# Patient Record
Sex: Male | Born: 1955 | Race: White | Hispanic: No | Marital: Married | Smoking: Never smoker
Health system: Southern US, Community
[De-identification: ages and names within clinical notes are randomized; demographics above are authoritative.]

## PROBLEM LIST (undated history)

## (undated) DIAGNOSIS — E162 Hypoglycemia, unspecified: Secondary | ICD-10-CM

## (undated) DIAGNOSIS — M1990 Unspecified osteoarthritis, unspecified site: Secondary | ICD-10-CM

## (undated) DIAGNOSIS — G4730 Sleep apnea, unspecified: Secondary | ICD-10-CM

## (undated) DIAGNOSIS — R569 Unspecified convulsions: Secondary | ICD-10-CM

## (undated) HISTORY — DX: Hypoglycemia, unspecified: E16.2

## (undated) HISTORY — DX: Sleep apnea, unspecified: G47.30

## (undated) HISTORY — DX: Unspecified convulsions: R56.9

## (undated) HISTORY — DX: Unspecified osteoarthritis, unspecified site: M19.90

---

## 1987-03-17 HISTORY — PX: CHOLECYSTECTOMY: SHX55

## 1991-08-15 DIAGNOSIS — R569 Unspecified convulsions: Secondary | ICD-10-CM

## 1991-08-15 DIAGNOSIS — Z87898 Personal history of other specified conditions: Secondary | ICD-10-CM | POA: Insufficient documentation

## 2000-07-16 ENCOUNTER — Emergency Department (HOSPITAL_COMMUNITY): Admission: EM | Admit: 2000-07-16 | Discharge: 2000-07-17 | Payer: Self-pay | Admitting: Emergency Medicine

## 2000-07-16 ENCOUNTER — Encounter: Payer: Self-pay | Admitting: Emergency Medicine

## 2000-08-14 HISTORY — PX: OTHER SURGICAL HISTORY: SHX169

## 2000-09-04 ENCOUNTER — Emergency Department (HOSPITAL_COMMUNITY): Admission: EM | Admit: 2000-09-04 | Discharge: 2000-09-04 | Payer: Self-pay

## 2006-12-27 ENCOUNTER — Ambulatory Visit: Payer: Self-pay | Admitting: Family Medicine

## 2006-12-27 DIAGNOSIS — N529 Male erectile dysfunction, unspecified: Secondary | ICD-10-CM | POA: Insufficient documentation

## 2006-12-27 DIAGNOSIS — M7710 Lateral epicondylitis, unspecified elbow: Secondary | ICD-10-CM | POA: Insufficient documentation

## 2006-12-31 ENCOUNTER — Ambulatory Visit: Payer: Self-pay | Admitting: Family Medicine

## 2007-01-11 LAB — CONVERTED CEMR LAB
ALT: 20 units/L (ref 0–53)
AST: 20 units/L (ref 0–37)
Albumin: 4 g/dL (ref 3.5–5.2)
Alkaline Phosphatase: 67 units/L (ref 39–117)
BUN: 13 mg/dL (ref 6–23)
Basophils Absolute: 0 10*3/uL (ref 0.0–0.1)
Basophils Relative: 0.5 % (ref 0.0–1.0)
Bilirubin, Direct: 0.1 mg/dL (ref 0.0–0.3)
CO2: 29 meq/L (ref 19–32)
Calcium: 9 mg/dL (ref 8.4–10.5)
Chloride: 110 meq/L (ref 96–112)
Creatinine, Ser: 0.8 mg/dL (ref 0.4–1.5)
Eosinophils Absolute: 0.2 10*3/uL (ref 0.0–0.6)
Eosinophils Relative: 2.5 % (ref 0.0–5.0)
GFR calc Af Amer: 131 mL/min
GFR calc non Af Amer: 108 mL/min
Glucose, Bld: 98 mg/dL (ref 70–99)
HCT: 41.2 % (ref 39.0–52.0)
Hemoglobin: 14.2 g/dL (ref 13.0–17.0)
Lymphocytes Relative: 31.6 % (ref 12.0–46.0)
MCHC: 34.6 g/dL (ref 30.0–36.0)
MCV: 91.1 fL (ref 78.0–100.0)
Monocytes Absolute: 0.6 10*3/uL (ref 0.2–0.7)
Monocytes Relative: 8.9 % (ref 3.0–11.0)
Neutro Abs: 4.1 10*3/uL (ref 1.4–7.7)
Neutrophils Relative %: 56.5 % (ref 43.0–77.0)
PSA: 0.27 ng/mL (ref 0.10–4.00)
Platelets: 330 10*3/uL (ref 150–400)
Potassium: 4.3 meq/L (ref 3.5–5.1)
RBC: 4.52 M/uL (ref 4.22–5.81)
RDW: 12.2 % (ref 11.5–14.6)
Sodium: 143 meq/L (ref 135–145)
TSH: 3.04 microintl units/mL (ref 0.35–5.50)
Total Bilirubin: 0.9 mg/dL (ref 0.3–1.2)
Total Protein: 7.2 g/dL (ref 6.0–8.3)
WBC: 7.2 10*3/uL (ref 4.5–10.5)

## 2007-01-12 ENCOUNTER — Ambulatory Visit: Payer: Self-pay | Admitting: Family Medicine

## 2007-01-13 ENCOUNTER — Encounter (INDEPENDENT_AMBULATORY_CARE_PROVIDER_SITE_OTHER): Payer: Self-pay | Admitting: *Deleted

## 2007-04-19 ENCOUNTER — Telehealth: Payer: Self-pay | Admitting: Family Medicine

## 2009-01-18 ENCOUNTER — Ambulatory Visit: Payer: Self-pay | Admitting: Family Medicine

## 2009-01-18 DIAGNOSIS — E162 Hypoglycemia, unspecified: Secondary | ICD-10-CM | POA: Insufficient documentation

## 2009-01-18 DIAGNOSIS — E785 Hyperlipidemia, unspecified: Secondary | ICD-10-CM | POA: Insufficient documentation

## 2009-01-18 LAB — CONVERTED CEMR LAB
ALT: 19 units/L (ref 0–53)
AST: 21 units/L (ref 0–37)
Albumin: 4.1 g/dL (ref 3.5–5.2)
Alkaline Phosphatase: 76 units/L (ref 39–117)
BUN: 13 mg/dL (ref 6–23)
Basophils Absolute: 0 10*3/uL (ref 0.0–0.1)
Basophils Relative: 0 % (ref 0.0–3.0)
Bilirubin, Direct: 0 mg/dL (ref 0.0–0.3)
CO2: 30 meq/L (ref 19–32)
Calcium: 9.1 mg/dL (ref 8.4–10.5)
Chloride: 105 meq/L (ref 96–112)
Cholesterol: 188 mg/dL (ref 0–200)
Creatinine, Ser: 0.8 mg/dL (ref 0.4–1.5)
Creatinine,U: 95 mg/dL
Eosinophils Absolute: 0.2 10*3/uL (ref 0.0–0.7)
Eosinophils Relative: 2.5 % (ref 0.0–5.0)
GFR calc non Af Amer: 107.27 mL/min (ref >60)
Glucose, Bld: 97 mg/dL (ref 70–99)
HCT: 43.8 % (ref 39.0–52.0)
HDL: 43.7 mg/dL (ref >39.00)
Hemoglobin: 14.8 g/dL (ref 13.0–17.0)
LDL Cholesterol: 122 mg/dL — ABNORMAL HIGH (ref 0–99)
Lymphocytes Relative: 33.4 % (ref 12.0–46.0)
Lymphs Abs: 2.3 10*3/uL (ref 0.7–4.0)
MCHC: 33.8 g/dL (ref 30.0–36.0)
MCV: 94.4 fL (ref 78.0–100.0)
Microalb Creat Ratio: 2.1 mg/g (ref 0.0–30.0)
Microalb, Ur: 0.2 mg/dL (ref 0.0–1.9)
Monocytes Absolute: 0.6 10*3/uL (ref 0.1–1.0)
Monocytes Relative: 8 % (ref 3.0–12.0)
Neutro Abs: 3.9 10*3/uL (ref 1.4–7.7)
Neutrophils Relative %: 56.1 % (ref 43.0–77.0)
PSA: 0.33 ng/mL (ref 0.10–4.00)
Platelets: 295 10*3/uL (ref 150.0–400.0)
Potassium: 4.8 meq/L (ref 3.5–5.1)
RBC: 4.64 M/uL (ref 4.22–5.81)
RDW: 12.2 % (ref 11.5–14.6)
Sodium: 142 meq/L (ref 135–145)
TSH: 3.12 microintl units/mL (ref 0.35–5.50)
Total Bilirubin: 0.8 mg/dL (ref 0.3–1.2)
Total CHOL/HDL Ratio: 4
Total Protein: 7.2 g/dL (ref 6.0–8.3)
Triglycerides: 113 mg/dL (ref 0.0–149.0)
VLDL: 22.6 mg/dL (ref 0.0–40.0)
WBC: 7 10*3/uL (ref 4.5–10.5)

## 2009-01-24 ENCOUNTER — Ambulatory Visit: Payer: Self-pay | Admitting: Family Medicine

## 2009-02-15 ENCOUNTER — Ambulatory Visit: Payer: Self-pay | Admitting: Family Medicine

## 2009-02-15 LAB — FECAL OCCULT BLOOD, GUAIAC: Fecal Occult Blood: NEGATIVE

## 2009-02-15 LAB — CONVERTED CEMR LAB
OCCULT 1: NEGATIVE
OCCULT 2: NEGATIVE
OCCULT 3: NEGATIVE

## 2009-02-18 ENCOUNTER — Encounter (INDEPENDENT_AMBULATORY_CARE_PROVIDER_SITE_OTHER): Payer: Self-pay | Admitting: *Deleted

## 2009-10-22 ENCOUNTER — Encounter (INDEPENDENT_AMBULATORY_CARE_PROVIDER_SITE_OTHER): Payer: Self-pay | Admitting: *Deleted

## 2010-02-24 ENCOUNTER — Ambulatory Visit: Payer: Self-pay | Admitting: Family Medicine

## 2010-02-25 LAB — CONVERTED CEMR LAB
ALT: 21 units/L (ref 0–53)
AST: 22 units/L (ref 0–37)
Albumin: 4 g/dL (ref 3.5–5.2)
Alkaline Phosphatase: 64 units/L (ref 39–117)
BUN: 13 mg/dL (ref 6–23)
Basophils Absolute: 0 10*3/uL (ref 0.0–0.1)
Basophils Relative: 0.2 % (ref 0.0–3.0)
Bilirubin, Direct: 0.1 mg/dL (ref 0.0–0.3)
CO2: 26 meq/L (ref 19–32)
Calcium: 9.2 mg/dL (ref 8.4–10.5)
Chloride: 106 meq/L (ref 96–112)
Cholesterol: 191 mg/dL (ref 0–200)
Creatinine, Ser: 0.7 mg/dL (ref 0.4–1.5)
Creatinine,U: 151.9 mg/dL
Eosinophils Absolute: 0.1 10*3/uL (ref 0.0–0.7)
Eosinophils Relative: 1.5 % (ref 0.0–5.0)
GFR calc non Af Amer: 116.88 mL/min (ref >60.00)
Glucose, Bld: 91 mg/dL (ref 70–99)
HCT: 43 % (ref 39.0–52.0)
HDL: 44.9 mg/dL (ref >39.00)
Hemoglobin: 14.7 g/dL (ref 13.0–17.0)
LDL Cholesterol: 124 mg/dL — ABNORMAL HIGH (ref 0–99)
Lymphocytes Relative: 31.7 % (ref 12.0–46.0)
Lymphs Abs: 2.8 10*3/uL (ref 0.7–4.0)
MCHC: 34.2 g/dL (ref 30.0–36.0)
MCV: 92.7 fL (ref 78.0–100.0)
Microalb Creat Ratio: 0.4 mg/g (ref 0.0–30.0)
Microalb, Ur: 0.6 mg/dL (ref 0.0–1.9)
Monocytes Absolute: 0.6 10*3/uL (ref 0.1–1.0)
Monocytes Relative: 7.1 % (ref 3.0–12.0)
Neutro Abs: 5.2 10*3/uL (ref 1.4–7.7)
Neutrophils Relative %: 59.5 % (ref 43.0–77.0)
PSA: 0.41 ng/mL (ref 0.10–4.00)
Platelets: 310 10*3/uL (ref 150.0–400.0)
Potassium: 4.6 meq/L (ref 3.5–5.1)
RBC: 4.64 M/uL (ref 4.22–5.81)
RDW: 13.3 % (ref 11.5–14.6)
Sodium: 141 meq/L (ref 135–145)
TSH: 2.53 microintl units/mL (ref 0.35–5.50)
Total Bilirubin: 0.5 mg/dL (ref 0.3–1.2)
Total CHOL/HDL Ratio: 4
Total Protein: 7.1 g/dL (ref 6.0–8.3)
Triglycerides: 113 mg/dL (ref 0.0–149.0)
VLDL: 22.6 mg/dL (ref 0.0–40.0)
WBC: 8.7 10*3/uL (ref 4.5–10.5)

## 2010-02-26 ENCOUNTER — Ambulatory Visit: Payer: Self-pay | Admitting: Family Medicine

## 2010-03-14 ENCOUNTER — Telehealth: Payer: Self-pay | Admitting: Family Medicine

## 2010-04-17 NOTE — Progress Notes (Signed)
Summary: Viagra  Phone Note Refill Request Message from:  Scriptline on March 14, 2010 3:32 PM  Refills Requested: Medication #1:  VIAGRA 100 MG  TABS 1/2-1 tab by mouth one hour prior as needed [facility]  [address] [id number]   Last Fill Date:  09/06/2009   Pharmacy Phone:  [phone]   Method Requested: Electronic Initial call taken by: Delilah Shan CMA Duncan Dull),  March 14, 2010 3:32 PM  Follow-up for Phone Call        sent. Follow-up by: Crawford Givens MD,  March 14, 2010 5:04 PM    Prescriptions: VIAGRA 100 MG  TABS (SILDENAFIL CITRATE) 1/2-1 tab by mouth one hour prior as needed  #9 x 12   Entered and Authorized by:   Crawford Givens MD   Signed by:   Crawford Givens MD on 03/14/2010   Method used:   Electronically to        [facility]  [address] ([facility]ail[facility]       [address]       [place], [place]  [address]       Ph: [phone] or [phone]       Fax: [phone]   RxID:   [id number]

## 2010-04-17 NOTE — Assessment & Plan Note (Signed)
Summary: CPX/DLO   Vital Signs:  Patient profile:   55 year old male Weight:      201.50 pounds BMI:     32.64 Temp:     98.9 degrees F oral Pulse rate:   88 / minute Pulse rhythm:   regular BP sitting:   128 / 86  (left arm) Cuff size:   large  Vitals Entered By: Sydell Axon LPN (February 26, 2010 3:01 PM) CC: 30 Minute checku-up   History of Present Illness: Pt here for Comp Exam. His elbow and knee are both doing well.  He has npo complaints today except for a dry cough for a while...he wears a breathe rite strip.   Preventive Screening-Counseling & Management  Alcohol-Tobacco     Alcohol drinks/day: 0     Smoking Status: never     Passive Smoke Exposure: no  Caffeine-Diet-Exercise     Caffeine use/day: 1     Does Patient Exercise: yes     Type of exercise: walks on job.     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 1/2-1 Tab By Mouth One Hour Prior As Needed 2)  Meloxicam 7.5 Mg Tabs (Meloxicam) .... Take One By Mouth Two Times A Day As Needed  Current Medications (verified): 1)  Viagra 100 Mg  Tabs (Sildenafil Citrate) .... 1/2-1 Tab By Mouth One Hour Prior As Needed 2)  Ibuprofen 200 Mg Tabs (Ibuprofen) .... As Needed  Allergies: 1)  ! * Feldane 2)  ! * Orystolite Ear Gtts  Past History:  Past Surgical History: Last updated: 11/29/2006 cholecystectomy 1989 head MRI 6/02  Family History: Last updated: 02/26/2010 Father Dec 19, 2073Stomach Ca died due to complcns of surgery Mother A 87 HTN Ovarian  tumors bilat, benign Polyp Brother A 59 MS Back prob Chol Brother A 63 Sleep Apnea Chol Brother A 62 Chol  Brother A 68 Chol  Social History: Last updated: 11/29/2006 Occupation:Route Salesman, [employer] Married Lives w/wife 2 children  Risk Factors: Alcohol Use: 0 (02/26/2010) Caffeine Use: 1 (02/26/2010) Exercise: yes (02/26/2010)  Risk Factors: Smoking Status: never (02/26/2010) Passive Smoke Exposure: no (02/26/2010)  Family History: Father December 19, 2073Stomach Ca died due to complcns of surgery Mother A 87 HTN Ovarian tumors bilat, benign Polyp Brother A 59 MS Back prob Chol Brother A 63 Sleep Apnea Chol Brother A 62 Chol  Brother A 68 Chol  Social History: Does Patient Exercise:  yes  Review of Systems General:  Denies chills, fatigue, fever, sweats, weakness, and weight loss. Eyes:  Denies blurring, discharge, and eye pain. ENT:  Denies decreased hearing, earache, and ringing in ears. CV:  Denies chest pain or discomfort, fainting, fatigue, shortness of breath with exertion, swelling of feet, and swelling of hands. Resp:  Denies cough, shortness of breath, and wheezing. GI:  Complains of indigestion; denies abdominal pain, bloody stools, change in bowel habits, constipation, dark tarry stools, diarrhea, loss of appetite, nausea, vomiting, vomiting blood, and yellowish skin color; occas. GU:  Complains of nocturia; denies discharge, dysuria, and urinary frequency; occas.  MS:  Denies joint pain, low back pain, muscle aches, cramps, and stiffness. Derm:  Denies dryness, itching, and rash. Neuro:  Denies numbness, poor balance, tingling, and tremors.  Physical Exam  General:  Well-developed,well-nourished,in no acute distress; alert,appropriate and cooperative throughout examination Head:  Normocephalic and atraumatic without obvious abnormalities. No apparent alopecia or balding. Sinuses NT. Eyes:  Conjunctiva clear bilaterally.  Ears:  External ear exam shows  no significant lesions or deformities.  Otoscopic examination reveals clear canals, tympanic membranes are intact bilaterally without bulging, retraction, inflammation or discharge. Hearing is grossly normal bilaterally. Cerumen removed bilat, L>R. Nose:  External nasal examination shows no deformity or inflammation. Nasal mucosa are pink and moist without lesions or exudates. Mouth:  Oral mucosa and oropharynx without lesions or exudates.  Teeth in good repair. Neck:  No deformities, masses, or tenderness noted. Chest Wall:  No deformities, masses, tenderness or gynecomastia noted. Breasts:  No masses or gynecomastia noted Lungs:  Normal respiratory effort, chest expands symmetrically. Lungs are clear to auscultation, no crackles or wheezes. Heart:  Normal rate and regular rhythm. S1 and S2 normal without gallop, murmur, click, rub or other extra sounds. Abdomen:  Bowel sounds positive,abdomen soft and non-tender without masses, organomegaly or hernias noted. Rectal:  No external abnormalities noted. Normal sphincter tone. No rectal masses or tenderness. G neg. Genitalia:  Testes bilaterally descended without nodularity, tenderness or masses. No scrotal masses or lesions. No penis lesions or urethral discharge. Prostate:  Prostate gland firm and smooth, no enlargement, nodularity, tenderness, mass, asymmetry or induration. 10-20gms Msk:  No deformity or scoliosis noted of thoracic or lumbar spine.  Tenderness over right lateral epicondyle. Pulses:  R and L carotid,radial,femoral,dorsalis pedis and posterior tibial pulses are full and equal bilaterally Extremities:  No clubbing, cyanosis, edema, or deformity noted with normal full range of motion of all joints.   Neurologic:  No cranial nerve deficits noted. Station and gait are normal. Sensory, motor and coordinative functions appear intact. Skin:  Intact without suspicious lesions or rashes Cervical Nodes:  No lymphadenopathy noted Inguinal  Nodes:  No significant adenopathy Psych:  Cognition and judgment appear intact. Alert and cooperative with normal attention span and concentration. No apparent delusions, illusions, hallucinations   Impression & Recommendations:  Problem # 1:  HEALTH MAINTENANCE EXAM (ICD-V70.0)  Reviewed preventive care protocols, scheduled due services, and updated immunizations. Flu shot today.  Mother had polyp on colonoscopy...he will need a colonoscopy eventually.  Problem # 2:  SPECIAL SCREENING MALIGNANT NEOPLASM OF PROSTATE (ICD-V76.44) Assessment: Unchanged Stable exam and PSA.  Problem # 3:  HYPERLIPIDEMIA, MILD (ICD-272.4) Assessment: Unchanged Acceptable  without meds. Cont as is. Labs Reviewed: SGOT: 22 (02/24/2010)   SGPT: 21 (02/24/2010)   HDL:44.90 (02/24/2010), 43.70 (01/18/2009)  LDL:124 (02/24/2010), 122 (01/18/2009)  Chol:191 (02/24/2010), 188 (01/18/2009)  Trig:113.0 (02/24/2010), 113.0 (01/18/2009)  Problem # 4:  HYPOGLYCEMIA, UNSPECIFIED (ICD-251.2) Assessment: Unchanged Has sxs rarely but they continue.  Problem # 5:  ERECTILE DYSFUNCTION, ORGANIC (ICD-607.84) Assessment: Unchanged Uses VIagra with good results, occas gets headache. His updated medication list for this problem includes:    Viagra 100 Mg Tabs (Sildenafil citrate) .Marland Kitchen... 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  Complete Medication List: 1)  Viagra 100 Mg Tabs (Sildenafil citrate) .... 1/2-1 tab by mouth one hour prior as needed 2)  Ibuprofen 200 Mg Tabs (Ibuprofen) .... As needed  Other Orders: Admin  1st Vaccine [phone]) Flu Vaccine 2yrs + [phone])  Patient Instructions: 1)  Flu shot today. 2)  RTC one year, sooner as needed. 3)  Colonoscopy in the future.   Orders Added: 1)  Est. Patient 40-64 years [99396] 2)  Admin 1st Vaccine [90471] 3)  Flu Vaccine 15yrs + [57322]    Current Allergies  (reviewed today): ! * FELDANE ! * ORYSTOLITE EAR GTTS  Flu Vaccine Consent Questions     Do you have a history of severe allergic reactions to this vaccine? no    Any prior history of allergic reactions to egg and/or gelatin? no    Do you have a sensitivity to the preservative Thimersol? no    Do you have a past history of Guillan-Barre Syndrome? no    Do you currently have an acute febrile illness? no    Have you ever had a severe reaction to latex? no    Vaccine information given and explained to patient? yes    Are you currently pregnant? no    Lot Number:[id number]   Exp Date:09/13/2010   Site Given  Left Deltoid [email or web address]

## 2010-04-17 NOTE — Letter (Signed)
Summary: Nadara Eaton letter  [place] [facility]  [address] [place], [place] [address]   Phone: [phone]  Fax: [phone]       10/22/2009 MRN: [id number]  Ricky Barton [address] [place], [place]  [address]  Dear Mr. Ricky [facility] [place], and [facility] announce the retirement of Arta Silence, M.D., from full-time practice at the [facility] office effective September 12, 2009 and his plans of returning part-time.  It is important to Dr. Hetty Ely and to our practice that you understand that [facility] has seven physicians in our office for your health care needs.  We will continue to offer the same exceptional care that you have today.    Dr. Hetty Ely has spoken to many of you about his plans for retirement and returning part-time in the fall.   We will continue to work with you through the transition to schedule appointments for you in the office and meet the high standards that [facility] is committed to.   Again, it is with great pleasure that we share the news that Dr. Hetty Ely will return to [facility] in October of 2011 with a reduced schedule.    If you have any questions, or would like to request an appointment with one of our physicians, please call us at [phone] and press the option for Scheduling an appointment.  We take pleasure in providing you with excellent patient care and look forward to seeing you at your next office visit.  Our [facility] Physicians are:  Tillman Abide, M.D. Laurita Quint, M.D. Roxy Manns, M.D. Kerby Nora, M.D. Hannah Beat, M.D. Ruthe Mannan, M.D. We proudly welcomed Raechel Ache, M.D. and Eustaquio Boyden, M.D. to the practice in July/August 2011.  Sincerely,  [facility]

## 2011-02-18 ENCOUNTER — Other Ambulatory Visit: Payer: Self-pay | Admitting: Family Medicine

## 2011-02-18 DIAGNOSIS — E785 Hyperlipidemia, unspecified: Secondary | ICD-10-CM

## 2011-02-18 DIAGNOSIS — E162 Hypoglycemia, unspecified: Secondary | ICD-10-CM

## 2011-02-18 DIAGNOSIS — G40309 Generalized idiopathic epilepsy and epileptic syndromes, not intractable, without status epilepticus: Secondary | ICD-10-CM

## 2011-02-25 ENCOUNTER — Other Ambulatory Visit (INDEPENDENT_AMBULATORY_CARE_PROVIDER_SITE_OTHER): Payer: 59

## 2011-02-25 DIAGNOSIS — E162 Hypoglycemia, unspecified: Secondary | ICD-10-CM

## 2011-02-25 DIAGNOSIS — E785 Hyperlipidemia, unspecified: Secondary | ICD-10-CM

## 2011-02-25 DIAGNOSIS — G40309 Generalized idiopathic epilepsy and epileptic syndromes, not intractable, without status epilepticus: Secondary | ICD-10-CM

## 2011-02-25 LAB — CBC WITH DIFFERENTIAL/PLATELET
Eosinophils Relative: 2.3 % (ref 0.0–5.0)
HCT: 43.2 % (ref 39.0–52.0)
Hemoglobin: 14.6 g/dL (ref 13.0–17.0)
Lymphs Abs: 2.1 10*3/uL (ref 0.7–4.0)
MCV: 93 fl (ref 78.0–100.0)
Monocytes Absolute: 0.6 10*3/uL (ref 0.1–1.0)
Monocytes Relative: 8.4 % (ref 3.0–12.0)
Neutro Abs: 4.6 10*3/uL (ref 1.4–7.7)
WBC: 7.5 10*3/uL (ref 4.5–10.5)

## 2011-02-25 LAB — HEPATIC FUNCTION PANEL
ALT: 21 U/L (ref 0–53)
AST: 24 U/L (ref 0–37)
Albumin: 4.2 g/dL (ref 3.5–5.2)
Alkaline Phosphatase: 62 U/L (ref 39–117)
Total Protein: 7.6 g/dL (ref 6.0–8.3)

## 2011-02-25 LAB — RENAL FUNCTION PANEL
CO2: 27 mEq/L (ref 19–32)
Calcium: 8.9 mg/dL (ref 8.4–10.5)
Chloride: 104 mEq/L (ref 96–112)
Glucose, Bld: 90 mg/dL (ref 70–99)
Potassium: 4.2 mEq/L (ref 3.5–5.1)
Sodium: 139 mEq/L (ref 135–145)

## 2011-03-03 ENCOUNTER — Encounter: Payer: Self-pay | Admitting: Family Medicine

## 2011-03-04 ENCOUNTER — Encounter: Payer: Self-pay | Admitting: Family Medicine

## 2011-03-04 ENCOUNTER — Ambulatory Visit (INDEPENDENT_AMBULATORY_CARE_PROVIDER_SITE_OTHER): Payer: 59 | Admitting: Family Medicine

## 2011-03-04 VITALS — BP 124/84 | HR 84 | Temp 98.4°F | Ht 66.0 in | Wt 204.5 lb

## 2011-03-04 DIAGNOSIS — E162 Hypoglycemia, unspecified: Secondary | ICD-10-CM

## 2011-03-04 DIAGNOSIS — G40309 Generalized idiopathic epilepsy and epileptic syndromes, not intractable, without status epilepticus: Secondary | ICD-10-CM

## 2011-03-04 DIAGNOSIS — M7710 Lateral epicondylitis, unspecified elbow: Secondary | ICD-10-CM

## 2011-03-04 DIAGNOSIS — E785 Hyperlipidemia, unspecified: Secondary | ICD-10-CM

## 2011-03-04 DIAGNOSIS — Z23 Encounter for immunization: Secondary | ICD-10-CM

## 2011-03-04 DIAGNOSIS — Z8371 Family history of colonic polyps: Secondary | ICD-10-CM

## 2011-03-04 NOTE — Progress Notes (Signed)
  Subjective:    Patient ID: Ricky Barton, male    DOB: Sep 21, 1955, 55 y.o.   MRN: [id number]  HPI Pt here for Comp Exam. His mother has colonic polyps and he has not yet had a colonoscopy.  He has no complaints except for aches and pains. He otherwise feels well and has no complaints.    Review of Systems  Constitutional: Negative for fever, chills, diaphoresis, appetite change, fatigue and unexpected weight change.  HENT: Negative for hearing loss, ear pain, tinnitus and ear discharge.   Eyes: Negative for pain, discharge and visual disturbance.  Respiratory: Negative for cough, shortness of breath and wheezing.   Cardiovascular: Negative for chest pain and palpitations.       No SOB w/ exertion  Gastrointestinal: Negative for nausea, vomiting, abdominal pain, diarrhea, constipation and blood in stool.       No heartburn or swallowing problems.  Genitourinary: Negative for dysuria, frequency and difficulty urinating.       Occas one time nocturia  Musculoskeletal: Negative for myalgias and back pain. Arthralgias: Knees and feet as above.   Skin: Negative for rash.       No itching or dryness.  Neurological: Negative for tremors and numbness.       No tingling or balance problems.  Hematological: Negative for adenopathy. Does not bruise/bleed easily.  Psychiatric/Behavioral: Negative for dysphoric mood and agitation.       Objective:   Physical Exam        Assessment & Plan:  HMPE

## 2011-03-04 NOTE — Assessment & Plan Note (Signed)
Adequate but trty to avoid fatty foods to get LDL even lower. Lab Results  Component Value Date   CHOL 188 02/25/2011   HDL 53.00 02/25/2011   LDLCALC 115* 02/25/2011   TRIG 100.0 02/25/2011   CHOLHDL 4 02/25/2011

## 2011-03-04 NOTE — Assessment & Plan Note (Signed)
Doing well with no recent problems.

## 2011-03-04 NOTE — Assessment & Plan Note (Signed)
Resolved. Now has plantar fascitiis of the MTPs.

## 2011-03-04 NOTE — Assessment & Plan Note (Signed)
None for many years. Cont to follow.

## 2011-03-04 NOTE — Patient Instructions (Addendum)
Refer for colonoscopy. Try Glucosamine and Chondroitin Sulfate for the knees. Would suggest Osteo Biflex with MSM and Hylauronic Acid.  Either Dr Reece Agar or Dr Para March would be fine in followup.

## 2011-03-12 ENCOUNTER — Ambulatory Visit (AMBULATORY_SURGERY_CENTER): Payer: 59 | Admitting: *Deleted

## 2011-03-12 VITALS — Ht 66.0 in | Wt 200.0 lb

## 2011-03-12 DIAGNOSIS — Z1211 Encounter for screening for malignant neoplasm of colon: Secondary | ICD-10-CM

## 2011-03-12 MED ORDER — PEG-KCL-NACL-NASULF-NA ASC-C 100 G PO SOLR: ORAL | Status: DC

## 2011-03-17 HISTORY — PX: POLYPECTOMY: SHX149

## 2011-03-17 HISTORY — PX: COLONOSCOPY: SHX174

## 2011-03-26 ENCOUNTER — Encounter: Payer: Self-pay | Admitting: Gastroenterology

## 2011-03-26 ENCOUNTER — Ambulatory Visit (AMBULATORY_SURGERY_CENTER): Payer: 59 | Admitting: Gastroenterology

## 2011-03-26 VITALS — BP 147/87 | HR 94 | Temp 98.2°F | Resp 18 | Ht 66.0 in | Wt 200.0 lb

## 2011-03-26 DIAGNOSIS — D126 Benign neoplasm of colon, unspecified: Secondary | ICD-10-CM

## 2011-03-26 DIAGNOSIS — Z1211 Encounter for screening for malignant neoplasm of colon: Secondary | ICD-10-CM

## 2011-03-26 MED ORDER — SODIUM CHLORIDE 0.9 % IV SOLN: 500.0000 mL | INTRAVENOUS | Status: DC

## 2011-03-26 NOTE — Op Note (Signed)
 [facility] [address] [place], [place]  [address]  COLONOSCOPY PROCEDURE REPORT  PATIENT:  Ricky Barton, Ricky Barton  MR#:  [id number] BIRTHDATE:  Dec 13, 1955, 55 yrs. old  GENDER:  male ENDOSCOPIST:  Judie Petit T. Russella Dar, MD, [facility] Referred by:  Laurita Quint, M.D. PROCEDURE DATE:  03/26/2011 PROCEDURE:  Colonoscopy with biopsy and snare polypectomy ASA CLASS:  Class II INDICATIONS:  1) Routine Risk Screening MEDICATIONS:   These medications were titrated to patient response per physician's verbal order, Fentanyl 75 mcg IV, Versed 7 mg IV DESCRIPTION OF PROCEDURE:   After the risks benefits and alternatives of the procedure were thoroughly explained, informed consent was obtained.  Digital rectal exam was performed and revealed no abnormalities.   The LB 180AL [id number] endoscope was introduced through the anus and advanced to the cecum, which was identified by both the appendix and ileocecal valve, without limitations.  The quality of the prep was good, using MoviPrep. The instrument was then slowly withdrawn as the colon was fully examined. <<PROCEDUREIMAGES>> FINDINGS:  A sessile polyp was found in the ascending colon. It was 6 mm in size. Polyp was snared without cautery. Retrieval was successful. A sessile polyp was found in the ascending colon. It was 4 mm in size. The polyp was removed using cold biopsy forceps. A sessile polyp was found in the descending colon. It was 6 mm in size. Polyp was snared without cautery. Retrieval was successful. Otherwise normal colonoscopy without other polyps, masses, vascular ectasias, or inflammatory changes.  Retroflexed views in the rectum revealed no abnormalities.  The time to cecum =  1 minutes. The scope was then withdrawn (time =  9  min) from the patient and the procedure completed.  COMPLICATIONS:  None  ENDOSCOPIC IMPRESSION: 1) 6 mm sessile polyp in the ascending colon 2) 4 mm sessile polyp in the ascending colon 3) 6 mm  sessile polyp in the descending colon  RECOMMENDATIONS: 1) Await pathology results 2) If the polyp are adenomatous (pre-cancerous), repeat colonoscopy in 5 years. Otherwise you should continue to follow colorectal cancer screening guidelines for "routine risk" patients with colonoscopy in 10 years.  Venita Lick. Russella Dar, MD, [facility]  n. eSIGNED:   Venita Lick. Charitie Hinote at 03/26/2011 09:35 AM  Marisue Brooklyn, [phone]

## 2011-03-26 NOTE — Progress Notes (Signed)
Patient did not experience any of the following events: a burn prior to discharge; a fall within the facility; wrong site/side/patient/procedure/implant event; or a hospital transfer or hospital admission upon discharge from the facility. (G8907) Patient did not have preoperative order for IV antibiotic SSI prophylaxis. (G8918)  

## 2011-03-27 ENCOUNTER — Telehealth: Payer: Self-pay | Admitting: *Deleted

## 2011-03-27 NOTE — Telephone Encounter (Signed)

## 2011-03-31 ENCOUNTER — Encounter: Payer: Self-pay | Admitting: Gastroenterology

## 2011-07-28 ENCOUNTER — Other Ambulatory Visit: Payer: Self-pay | Admitting: Family Medicine

## 2011-07-29 NOTE — Telephone Encounter (Signed)
Sent in

## 2011-07-29 NOTE — Telephone Encounter (Signed)
OK to refill

## 2012-02-25 ENCOUNTER — Other Ambulatory Visit: Payer: Self-pay | Admitting: Family Medicine

## 2012-02-25 DIAGNOSIS — Z125 Encounter for screening for malignant neoplasm of prostate: Secondary | ICD-10-CM

## 2012-02-25 DIAGNOSIS — E785 Hyperlipidemia, unspecified: Secondary | ICD-10-CM

## 2012-02-26 ENCOUNTER — Other Ambulatory Visit (INDEPENDENT_AMBULATORY_CARE_PROVIDER_SITE_OTHER): Payer: 59

## 2012-02-26 DIAGNOSIS — Z125 Encounter for screening for malignant neoplasm of prostate: Secondary | ICD-10-CM

## 2012-02-26 DIAGNOSIS — E785 Hyperlipidemia, unspecified: Secondary | ICD-10-CM

## 2012-02-26 LAB — COMPREHENSIVE METABOLIC PANEL
Albumin: 4.2 g/dL (ref 3.5–5.2)
CO2: 26 mEq/L (ref 19–32)
Calcium: 9 mg/dL (ref 8.4–10.5)
GFR: 107.6 mL/min (ref >60.00)
Glucose, Bld: 97 mg/dL (ref 70–99)
Potassium: 4.6 mEq/L (ref 3.5–5.1)
Sodium: 137 mEq/L (ref 135–145)
Total Protein: 7.6 g/dL (ref 6.0–8.3)

## 2012-02-26 LAB — PSA: PSA: 0.3 ng/mL (ref 0.10–4.00)

## 2012-02-26 LAB — LIPID PANEL
Cholesterol: 193 mg/dL (ref 0–200)
HDL: 46.6 mg/dL (ref >39.00)
LDL Cholesterol: 122 mg/dL — ABNORMAL HIGH (ref 0–99)
Total CHOL/HDL Ratio: 4
Triglycerides: 121 mg/dL (ref 0.0–149.0)
VLDL: 24.2 mg/dL (ref 0.0–40.0)

## 2012-03-04 ENCOUNTER — Encounter: Payer: Self-pay | Admitting: Family Medicine

## 2012-03-04 ENCOUNTER — Ambulatory Visit (INDEPENDENT_AMBULATORY_CARE_PROVIDER_SITE_OTHER): Payer: 59 | Admitting: Family Medicine

## 2012-03-04 VITALS — BP 126/80 | HR 104 | Temp 98.2°F | Ht 66.0 in | Wt 209.2 lb

## 2012-03-04 DIAGNOSIS — Z23 Encounter for immunization: Secondary | ICD-10-CM

## 2012-03-04 DIAGNOSIS — G40309 Generalized idiopathic epilepsy and epileptic syndromes, not intractable, without status epilepticus: Secondary | ICD-10-CM

## 2012-03-04 DIAGNOSIS — Z0000 Encounter for general adult medical examination without abnormal findings: Secondary | ICD-10-CM

## 2012-03-04 DIAGNOSIS — E785 Hyperlipidemia, unspecified: Secondary | ICD-10-CM

## 2012-03-04 DIAGNOSIS — N529 Male erectile dysfunction, unspecified: Secondary | ICD-10-CM

## 2012-03-04 NOTE — Assessment & Plan Note (Signed)
Preventative protocols reviewed and updated unless pt declined. Discussed healthy diet and lifestyle. Reassuring DRE/PSA today. UTD colonoscopy. Flu shot today.

## 2012-03-04 NOTE — Assessment & Plan Note (Signed)
Continue viagra.  

## 2012-03-04 NOTE — Progress Notes (Signed)
Subjective:    Patient ID: Ricky Barton, male    DOB: December 23, 1955, 56 y.o.   MRN: [id number]  HPI CC: annual  Preventative: Colonoscopy 03/2011 - 3 adenomatous polyps, rec rpt 5 yrs (Dr Russella Dar) Prostate cancer screening - discussed, will screen this year and discuss next year.  Nocturia 1x/night.  Strong stream. Flu shot - today Tetanus - 2010  Caffeine: occasional coffee/tea Married and lives with wife, grown children, 2 dogs Occupation: [employer]ending Activity: no regular exercise, some golfing Diet: good water, fruits/vegetables daily  Medications and allergies reviewed and updated in chart.  Past histories reviewed and updated if relevant as below. Patient Active Problem List  Diagnosis  . HYPERLIPIDEMIA, MILD  . GRAND MAL SEIZURE  . ERECTILE DYSFUNCTION, ORGANIC   Past Medical History  Diagnosis Date  . Seizures 1990s    isolated, none since, was on dilantin for 3 yrs, then stopped   Past Surgical History  Procedure Date  . Cholecystectomy 1989  . Head mri 06/02  . Colonoscopy 03/2011    3 adenomatous polyps, rpt 10 yrs (Dr Russella Dar)   History  Substance Use Topics  . Smoking status: Never Smoker   . Smokeless tobacco: Never Used  . Alcohol Use: No   Family History  Problem Relation Age of Onset  . Hypertension Mother   . Colon polyps Mother   . Cancer Father 78    stomach cancer  . Hyperlipidemia Brother   . Hyperlipidemia Brother   . Sleep apnea Brother   . Hyperlipidemia Brother   . CAD Brother 67    MI  . Hyperlipidemia Brother   . Multiple sclerosis Brother    Allergies  Allergen Reactions  . Piroxicam Rash    blisters   Current Outpatient Prescriptions on File Prior to Visit  Medication Sig Dispense Refill  . ibuprofen (ADVIL,MOTRIN) 200 MG tablet Take 200 mg by mouth as needed.        Marland Kitchen VIAGRA 100 MG tablet TAKE 1/2 TO 1 TABLET BY MOUTH 1 HOUR PRIOR AS NEEDED  9 tablet  1     Review of Systems  Constitutional: Negative for fever, chills,  activity change, appetite change, fatigue and unexpected weight change.  HENT: Negative for hearing loss and neck pain.   Eyes: Negative for visual disturbance.  Respiratory: Negative for cough, chest tightness, shortness of breath and wheezing.   Cardiovascular: Negative for chest pain, palpitations and leg swelling.  Gastrointestinal: Negative for nausea, vomiting, abdominal pain, diarrhea, constipation, blood in stool and abdominal distention.  Genitourinary: Negative for hematuria and difficulty urinating.  Musculoskeletal: Negative for myalgias and arthralgias.  Skin: Negative for rash.  Neurological: Negative for dizziness, seizures, syncope and headaches.  Hematological: Does not bruise/bleed easily.  Psychiatric/Behavioral: Negative for dysphoric mood. The patient is not nervous/anxious.        Objective:   Physical Exam  Nursing note and vitals reviewed. Constitutional: He is oriented to person, place, and time. He appears well-developed and well-nourished. No distress.  HENT:  Head: Normocephalic and atraumatic.  Right Ear: Hearing, tympanic membrane, external ear and ear canal normal.  Left Ear: Hearing, tympanic membrane, external ear and ear canal normal.  Nose: Nose normal.  Mouth/Throat: Oropharynx is clear and moist. No oropharyngeal exudate.  Eyes: Conjunctivae normal and EOM are normal. Pupils are equal, round, and reactive to light. No scleral icterus.  Neck: Normal range of motion. Neck supple.  Cardiovascular: Normal rate, regular rhythm, normal heart sounds and intact distal  pulses.   No murmur heard. Pulses:      Radial pulses are 2+ on the right side, and 2+ on the left side.  Pulmonary/Chest: Effort normal and breath sounds normal. No respiratory distress. He has no wheezes. He has no rales.  Abdominal: Soft. Bowel sounds are normal. He exhibits no distension and no mass. There is no tenderness. There is no rebound and no guarding.  Genitourinary: Rectum  normal and prostate normal. Rectal exam shows no external hemorrhoid, no internal hemorrhoid, no fissure, no mass, no tenderness and anal tone normal. Prostate is not enlarged (20gm) and not tender.  Musculoskeletal: Normal range of motion. He exhibits no edema.  Lymphadenopathy:    He has no cervical adenopathy.  Neurological: He is alert and oriented to person, place, and time.       CN grossly intact, station and gait intact  Skin: Skin is warm and dry. No rash noted.  Psychiatric: He has a normal mood and affect. His behavior is normal. Judgment and thought content normal.       Assessment & Plan:

## 2012-03-04 NOTE — Assessment & Plan Note (Signed)
Discussed FLP.  Chronic, stable. Discussed goal LDL <130, ideally <100 given fam hx.

## 2012-03-04 NOTE — Assessment & Plan Note (Signed)
Off AED for years now, no seizures.

## 2012-03-04 NOTE — Patient Instructions (Addendum)
Flu shot today. Try to increase walking. Good to see you today, call us with questions. Return as needed or in 1 year for next physical.

## 2013-03-06 ENCOUNTER — Encounter: Payer: 59 | Admitting: Family Medicine

## 2013-03-18 ENCOUNTER — Other Ambulatory Visit: Payer: Self-pay | Admitting: Family Medicine

## 2013-03-18 DIAGNOSIS — E785 Hyperlipidemia, unspecified: Secondary | ICD-10-CM

## 2013-03-18 DIAGNOSIS — Z125 Encounter for screening for malignant neoplasm of prostate: Secondary | ICD-10-CM

## 2013-03-23 ENCOUNTER — Other Ambulatory Visit: Payer: 59

## 2013-03-23 ENCOUNTER — Encounter: Payer: 59 | Admitting: Family Medicine

## 2013-03-24 ENCOUNTER — Other Ambulatory Visit (INDEPENDENT_AMBULATORY_CARE_PROVIDER_SITE_OTHER): Payer: 59

## 2013-03-24 DIAGNOSIS — E785 Hyperlipidemia, unspecified: Secondary | ICD-10-CM

## 2013-03-24 DIAGNOSIS — Z0000 Encounter for general adult medical examination without abnormal findings: Secondary | ICD-10-CM

## 2013-03-24 DIAGNOSIS — Z125 Encounter for screening for malignant neoplasm of prostate: Secondary | ICD-10-CM

## 2013-03-24 LAB — LIPID PANEL
CHOL/HDL RATIO: 5
CHOLESTEROL: 204 mg/dL — AB (ref 0–200)
HDL: 44.8 mg/dL (ref >39.00)
TRIGLYCERIDES: 107 mg/dL (ref 0.0–149.0)
VLDL: 21.4 mg/dL (ref 0.0–40.0)

## 2013-03-24 LAB — BASIC METABOLIC PANEL
BUN: 14 mg/dL (ref 6–23)
CALCIUM: 9.5 mg/dL (ref 8.4–10.5)
CO2: 30 mEq/L (ref 19–32)
CREATININE: 0.9 mg/dL (ref 0.4–1.5)
Chloride: 105 mEq/L (ref 96–112)
GFR: 91.05 mL/min (ref >60.00)
Glucose, Bld: 100 mg/dL — ABNORMAL HIGH (ref 70–99)
Potassium: 5.3 mEq/L — ABNORMAL HIGH (ref 3.5–5.1)
SODIUM: 139 meq/L (ref 135–145)

## 2013-03-24 LAB — PSA: PSA: 0.36 ng/mL (ref 0.10–4.00)

## 2013-03-24 LAB — LDL CHOLESTEROL, DIRECT: LDL DIRECT: 125.6 mg/dL

## 2013-03-30 ENCOUNTER — Ambulatory Visit (INDEPENDENT_AMBULATORY_CARE_PROVIDER_SITE_OTHER): Payer: 59 | Admitting: Family Medicine

## 2013-03-30 ENCOUNTER — Encounter: Payer: Self-pay | Admitting: Family Medicine

## 2013-03-30 VITALS — BP 142/86 | HR 92 | Temp 98.0°F | Ht 66.0 in | Wt 209.5 lb

## 2013-03-30 DIAGNOSIS — Z23 Encounter for immunization: Secondary | ICD-10-CM

## 2013-03-30 DIAGNOSIS — Z0000 Encounter for general adult medical examination without abnormal findings: Secondary | ICD-10-CM

## 2013-03-30 DIAGNOSIS — R739 Hyperglycemia, unspecified: Secondary | ICD-10-CM

## 2013-03-30 DIAGNOSIS — E875 Hyperkalemia: Secondary | ICD-10-CM

## 2013-03-30 MED ORDER — SILDENAFIL CITRATE 100 MG PO TABS: ORAL_TABLET | ORAL | Status: DC

## 2013-03-30 NOTE — Assessment & Plan Note (Signed)
viagra refilled, sample provided.

## 2013-03-30 NOTE — Progress Notes (Signed)
Subjective:    Patient ID: Ricky Barton, male    DOB: 1955/04/18, 58 y.o.   MRN: [id number]  HPI CC: CPE  [name] presents today for annual exam.  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  Some right lower jaw ache over last week.  Flossing once a day.  Sees dentist regularly.  Next appt is 05/2013.  Seat belt use discussed Sunscreen use discussed.  No sunburns.  Preventative:  Colonoscopy 03/2011 - 3 adenomatous polyps, rec rpt 5 yrs (Dr Fuller Plan) Prostate cancer screening - discussed, always normal <1 PSA, this year normal.  Will defer DRE and rpt in 2017.   Flu shot - today Tetanus - 2010  borCaffeine: occasional coffee/tea  Married and lives with wife, grown children, 2 dogs  Occupation: [employer]  Activity: no regular exercise, some golfing Diet: good water, fruits/vegetables daily  Medications and allergies reviewed and updated in chart.  Past histories reviewed and updated if relevant as below. Patient Active Problem List   Diagnosis Date Noted  . Healthcare maintenance 03/04/2012  . [place], MILD 01/18/2009  . 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 Seizures 1990s    isolated, none since, was on dilantin for 3 yrs, then stopped   Past Surgical History  Procedure Laterality Date  . Cholecystectomy  1989  . Head mri  06/02  . Colonoscopy  03/2011    3 adenomatous polyps, rpt 10 yrs (Dr Fuller Plan)   History  Substance Use Topics  . Smoking status: Never Smoker   . Smokeless tobacco: Never Used  . Alcohol Use: No   Family History  Problem Relation Age of Onset  . Hypertension Mother   . Colon polyps Mother   . Cancer Father 23    stomach cancer  . Hyperlipidemia Brother   . Hyperlipidemia Brother   . Sleep apnea Brother   . Hyperlipidemia Brother   . CAD Brother 16    MI  . Hyperlipidemia Brother   . Multiple sclerosis  Brother    Allergies  Allergen Reactions  . Piroxicam Rash    blisters   Current Outpatient Prescriptions on File Prior to Visit  Medication Sig Dispense Refill  . ibuprofen (ADVIL,MOTRIN) 200 MG tablet Take 200 mg by mouth as needed.        Marland Kitchen VIAGRA 100 MG tablet TAKE 1/2 TO 1 TABLET BY MOUTH 1 HOUR PRIOR AS NEEDED  9 tablet  1   No current facility-administered medications on file prior to visit.     Review of Systems  Constitutional: Negative for fever, chills, activity change, appetite change, fatigue and unexpected weight change.  HENT: Negative for hearing loss.   Eyes: Negative for visual disturbance.  Respiratory: Negative for cough, chest tightness, shortness of breath and wheezing.   Cardiovascular: Negative for chest pain, palpitations and leg swelling.  Gastrointestinal: Negative for nausea, vomiting, abdominal pain, diarrhea, constipation, blood in stool and abdominal distention.  Genitourinary: Negative for hematuria and difficulty urinating.  Musculoskeletal: Negative for arthralgias, myalgias and neck pain.  Skin: Negative for rash.  Neurological: Negative for dizziness, seizures, syncope and headaches.  Hematological: Negative for adenopathy. Does not bruise/bleed easily.  Psychiatric/Behavioral: Negative for dysphoric mood. The patient is not nervous/anxious.        Objective:   Physical Exam  Nursing note and vitals reviewed. Constitutional: He is oriented to person, place,  and time. He appears well-developed and well-nourished. No distress.  HENT:  Head: Normocephalic and atraumatic.  Right Ear: Hearing, tympanic membrane, external ear and ear canal normal.  Left Ear: Hearing, tympanic membrane, external ear and ear canal normal.  Nose: Nose normal.  Mouth/Throat: Oropharynx is clear and moist. No oropharyngeal exudate.  Eyes: Conjunctivae and EOM are normal. Pupils are equal, round, and reactive to light. No scleral icterus.  Neck: Normal range of  motion. Neck supple.  Cardiovascular: Normal rate, regular rhythm, normal heart sounds and intact distal pulses.   No murmur heard. Pulses:      Radial pulses are 2+ on the right side, and 2+ on the left side.  Pulmonary/Chest: Effort normal and breath sounds normal. No respiratory distress. He has no wheezes. He has no rales.  Abdominal: Soft. Bowel sounds are normal. He exhibits no distension and no mass. There is no tenderness. There is no rebound and no guarding.  Musculoskeletal: Normal range of motion. He exhibits no edema.  Lymphadenopathy:    He has no cervical adenopathy.  Neurological: He is alert and oriented to person, place, and time.  CN grossly intact, station and gait intact  Skin: Skin is warm and dry. No rash noted.  Psychiatric: He has a normal mood and affect. His behavior is normal. Judgment and thought content normal.       Assessment & Plan:

## 2013-03-30 NOTE — Assessment & Plan Note (Signed)
Preventative protocols reviewed and updated unless pt declined. Discussed healthy diet and lifestyle.  Slightly elevated potassium - will recheck today. Will space out prostate exams as all PSAs have been <0.5

## 2013-03-30 NOTE — Assessment & Plan Note (Signed)
Discussed avoiding added sugars. 

## 2013-03-30 NOTE — Patient Instructions (Signed)
Flu shot today. Prostate always normal - we will skip next year and recheck in 2017. Low cholesterol diet handout provided today. goal LDL <100 given family history. Your sugar was borderline elevated today - keep an eye on added sugars in the diet. Recheck potassium level today.

## 2013-03-30 NOTE — Progress Notes (Signed)
Pre-visit discussion using our clinic review tool. No additional management support is needed unless otherwise documented below in the visit note.  

## 2013-03-30 NOTE — Assessment & Plan Note (Signed)
Discussed likely goal LDL <100 given fmhx  Provided with low chol diet handout.

## 2013-03-31 LAB — POTASSIUM: POTASSIUM: 4.1 meq/L (ref 3.5–5.1)

## 2013-04-03 ENCOUNTER — Encounter: Payer: Self-pay | Admitting: *Deleted

## 2013-06-13 ENCOUNTER — Encounter: Payer: Self-pay | Admitting: Family Medicine

## 2014-04-01 ENCOUNTER — Other Ambulatory Visit: Payer: Self-pay | Admitting: Family Medicine

## 2014-04-01 DIAGNOSIS — Z125 Encounter for screening for malignant neoplasm of prostate: Secondary | ICD-10-CM

## 2014-04-01 DIAGNOSIS — R739 Hyperglycemia, unspecified: Secondary | ICD-10-CM

## 2014-04-01 DIAGNOSIS — E785 Hyperlipidemia, unspecified: Secondary | ICD-10-CM

## 2014-04-03 ENCOUNTER — Other Ambulatory Visit (INDEPENDENT_AMBULATORY_CARE_PROVIDER_SITE_OTHER): Payer: 59

## 2014-04-03 DIAGNOSIS — R739 Hyperglycemia, unspecified: Secondary | ICD-10-CM

## 2014-04-03 DIAGNOSIS — E785 Hyperlipidemia, unspecified: Secondary | ICD-10-CM

## 2014-04-03 DIAGNOSIS — Z125 Encounter for screening for malignant neoplasm of prostate: Secondary | ICD-10-CM

## 2014-04-03 LAB — PSA: PSA: 0.37 ng/mL (ref 0.10–4.00)

## 2014-04-03 LAB — BASIC METABOLIC PANEL
BUN: 22 mg/dL (ref 6–23)
CHLORIDE: 105 meq/L (ref 96–112)
CO2: 28 mEq/L (ref 19–32)
Calcium: 9.3 mg/dL (ref 8.4–10.5)
Creatinine, Ser: 0.82 mg/dL (ref 0.40–1.50)
GFR: 102.31 mL/min (ref >60.00)
GLUCOSE: 102 mg/dL — AB (ref 70–99)
Potassium: 4.9 mEq/L (ref 3.5–5.1)
SODIUM: 139 meq/L (ref 135–145)

## 2014-04-03 LAB — LIPID PANEL
Cholesterol: 175 mg/dL (ref 0–200)
HDL: 48.1 mg/dL (ref >39.00)
LDL Cholesterol: 105 mg/dL — ABNORMAL HIGH (ref 0–99)
NONHDL: 126.9
TRIGLYCERIDES: 108 mg/dL (ref 0.0–149.0)
Total CHOL/HDL Ratio: 4
VLDL: 21.6 mg/dL (ref 0.0–40.0)

## 2014-04-05 ENCOUNTER — Ambulatory Visit (INDEPENDENT_AMBULATORY_CARE_PROVIDER_SITE_OTHER): Payer: 59 | Admitting: Family Medicine

## 2014-04-05 ENCOUNTER — Encounter: Payer: Self-pay | Admitting: Family Medicine

## 2014-04-05 VITALS — BP 124/74 | HR 87 | Temp 98.1°F | Ht 66.0 in | Wt 209.8 lb

## 2014-04-05 DIAGNOSIS — N529 Male erectile dysfunction, unspecified: Secondary | ICD-10-CM

## 2014-04-05 DIAGNOSIS — R739 Hyperglycemia, unspecified: Secondary | ICD-10-CM

## 2014-04-05 DIAGNOSIS — Z23 Encounter for immunization: Secondary | ICD-10-CM

## 2014-04-05 DIAGNOSIS — R569 Unspecified convulsions: Secondary | ICD-10-CM

## 2014-04-05 DIAGNOSIS — Z0000 Encounter for general adult medical examination without abnormal findings: Secondary | ICD-10-CM

## 2014-04-05 DIAGNOSIS — E669 Obesity, unspecified: Secondary | ICD-10-CM | POA: Insufficient documentation

## 2014-04-05 DIAGNOSIS — E785 Hyperlipidemia, unspecified: Secondary | ICD-10-CM

## 2014-04-05 MED ORDER — SILDENAFIL CITRATE 100 MG PO TABS: ORAL_TABLET | ORAL | Status: AC

## 2014-04-05 NOTE — Patient Instructions (Addendum)
Flu shot today. viagra refilled, coupon provided today. Continue pushing fluids and rest.  Work on weight - try to incorporate some exercise into routine.  Return as needed or in 1 year for next physical.

## 2014-04-05 NOTE — Assessment & Plan Note (Signed)
Body mass index is 33.87 kg/(m^2).  Discussed healthy diet and lifestyle changes to affect weight loss.

## 2014-04-05 NOTE — Assessment & Plan Note (Signed)
Minimal off meds. LDL goal <100, better #s this year. Given fmhx, suggested he start aspirin 81mg  MWF. Pt agrees.

## 2014-04-05 NOTE — Assessment & Plan Note (Addendum)
Preventative protocols reviewed and updated unless pt declined. Discussed healthy diet and lifestyle.  DRE next year.

## 2014-04-05 NOTE — Progress Notes (Signed)
Pre visit review using our clinic review tool, if applicable. No additional management support is needed unless otherwise documented below in the visit note. 

## 2014-04-05 NOTE — Progress Notes (Signed)
BP 124/74 mmHg  Pulse 87  Temp(Src) 98.1 F (36.7 C) (Oral)  Ht 5\' 6"  (1.676 m)  Wt 209 lb 12 oz (95.142 kg)  BMI 33.87 kg/m2  SpO2 96%   CC: CPE  Subjective:    Patient ID: Ricky Barton, male    DOB: 1955-09-12, 59 y.o.   MRN: [id number]  HPI: Ricky Barton is a 59 y.o. male presenting on 04/05/2014 for Annual Exam   Noticing some discomfort L 1st MCP and R middle finger. Manual labor. Ibuprofen helps. Worse at bedtime - achey and stiff.   H/o seizures - remotely, stable off AEDs  Preventative: Colonoscopy 03/2011 - 3 adenomatous polyps, rec rpt 5 yrs (Dr Fuller Plan). Prostate cancer screening - discussed, always normal <1 PSA, this year normal. Will defer DRE and rpt in 2017. Flu shot - today Tetanus - 2010 Seat belt use discussed Sunscreen use discussed. No changing moles on skin.  Caffeine: occasional coffee/tea  Married and lives with wife, grown children, dog  Occupation: [employer]  Activity: no regular exercise, some golfing Diet: good water, fruits/vegetables daily  Relevant past medical, surgical, family and social history reviewed and updated as indicated. Interim medical history since our last visit reviewed. Allergies and medications reviewed and updated. Current Outpatient Prescriptions on File Prior to Visit  Medication Sig  . ibuprofen (ADVIL,MOTRIN) 200 MG tablet Take 200 mg by mouth as needed.     No current facility-administered medications on file prior to visit.    Review of Systems  Constitutional: Negative for fever, chills, activity change, appetite change, fatigue and unexpected weight change.  HENT: Negative for hearing loss.   Eyes: Negative for visual disturbance.  Respiratory: Negative for cough, chest tightness, shortness of breath and wheezing.   Cardiovascular: Negative for chest pain, palpitations and leg swelling.  Gastrointestinal: Negative for nausea, vomiting, abdominal pain, diarrhea, constipation, blood in stool and  abdominal distention.  Genitourinary: Negative for hematuria and difficulty urinating.  Musculoskeletal: Positive for arthralgias (see HPI). Negative for myalgias and neck pain.  Skin: Negative for rash.  Neurological: Negative for dizziness, seizures, syncope and headaches.  Hematological: Negative for adenopathy. Does not bruise/bleed easily.  Psychiatric/Behavioral: Negative for dysphoric mood. The patient is not nervous/anxious.    Per HPI unless specifically indicated above     Objective:    BP 124/74 mmHg  Pulse 87  Temp(Src) 98.1 F (36.7 C) (Oral)  Ht 5\' 6"  (1.676 m)  Wt 209 lb 12 oz (95.142 kg)  BMI 33.87 kg/m2  SpO2 96%  Wt Readings from Last 3 Encounters:  04/05/14 209 lb 12 oz (95.142 kg)  03/30/13 209 lb 8 oz (95.029 kg)  03/04/12 209 lb 4 oz (94.915 kg)    Physical Exam  Constitutional: He is oriented to person, place, and time. He appears well-developed and well-nourished. No distress.  HENT:  Head: Normocephalic and atraumatic.  Right Ear: Hearing, tympanic membrane, external ear and ear canal normal.  Left Ear: Hearing, tympanic membrane, external ear and ear canal normal.  Nose: Nose normal.  Mouth/Throat: Uvula is midline, oropharynx is clear and moist and mucous membranes are normal. No oropharyngeal exudate, posterior oropharyngeal edema or posterior oropharyngeal erythema.  Eyes: Conjunctivae and EOM are normal. Pupils are equal, round, and reactive to light. No scleral icterus.  Neck: Normal range of motion. Neck supple. Carotid bruit is not present. No thyromegaly present.  Cardiovascular: Normal rate, regular rhythm, normal heart sounds and intact distal pulses.   No murmur  heard. Pulses:      Radial pulses are 2+ on the right side, and 2+ on the left side.  Pulmonary/Chest: Effort normal and breath sounds normal. No respiratory distress. He has no wheezes. He has no rales.  Abdominal: Soft. Normal appearance and bowel sounds are normal. He exhibits  no distension and no mass. There is no hepatosplenomegaly. There is no tenderness. There is no rigidity, no rebound, no guarding and negative Murphy's sign. No hernia. Hernia confirmed negative in the right inguinal area.  Specifically, no RLQ pain, no hernias noted  Musculoskeletal: Normal range of motion. He exhibits no edema.  Lymphadenopathy:    He has no cervical adenopathy.  Neurological: He is alert and oriented to person, place, and time.  CN grossly intact, station and gait intact  Skin: Skin is warm and dry. No rash noted.  Psychiatric: He has a normal mood and affect. His behavior is normal. Judgment and thought content normal.  Nursing note and vitals reviewed.  Results for orders placed or performed in visit on 04/03/14  Lipid panel  Result Value Ref Range   Cholesterol 175 0 - 200 mg/dL   Triglycerides 108.0 0.0 - 149.0 mg/dL   HDL 48.10 >39.00 mg/dL   VLDL 21.6 0.0 - 40.0 mg/dL   LDL Cholesterol 105 (H) 0 - 99 mg/dL   Total CHOL/HDL Ratio 4    NonHDL 364.68   Basic metabolic panel  Result Value Ref Range   Sodium 139 135 - 145 mEq/L   Potassium 4.9 3.5 - 5.1 mEq/L   Chloride 105 96 - 112 mEq/L   CO2 28 19 - 32 mEq/L   Glucose, Bld 102 (H) 70 - 99 mg/dL   BUN 22 6 - 23 mg/dL   Creatinine, Ser 0.82 0.40 - 1.50 mg/dL   Calcium 9.3 8.4 - 10.5 mg/dL   GFR 102.31 >60.00 mL/min  PSA  Result Value Ref Range   PSA 0.37 0.10 - 4.00 ng/mL      Assessment & Plan:   Problem List Items Addressed This Visit    Seizures    Stable off AED      Obesity    Body mass index is 33.87 kg/(m^2).  Discussed healthy diet and lifestyle changes to affect weight loss.      Hyperglycemia    Continue to encourage weight loss.      HLD (hyperlipidemia)    Minimal off meds. LDL goal <100, better #s this year. Given fmhx, suggested he start aspirin 81mg  MWF. Pt agrees.      Relevant Medications   sildenafil (VIAGRA) tablet   aspirin 81 MG EC tablet   Healthcare maintenance -  Primary    Preventative protocols reviewed and updated unless pt declined. Discussed healthy diet and lifestyle.  DRE next year.      ERECTILE DYSFUNCTION, ORGANIC    viagra refilled. Coupon provided          Follow up plan: Return in about 1 year (around 04/06/2015), or as needed, for annual exam, prior fasting for blood work.

## 2014-04-05 NOTE — Assessment & Plan Note (Addendum)
viagra refilled. Coupon provided

## 2014-04-05 NOTE — Addendum Note (Signed)
Addended by: Josetta Huddle on: 04/05/2014 06:48 PM   Modules accepted: Orders

## 2014-04-05 NOTE — Assessment & Plan Note (Addendum)
Continue to encourage weight loss. 

## 2014-04-05 NOTE — Assessment & Plan Note (Signed)
Stable off AED

## 2015-07-15 HISTORY — PX: COLONOSCOPY: SHX174

## 2015-08-30 ENCOUNTER — Other Ambulatory Visit: Payer: Self-pay | Admitting: Family Medicine

## 2015-08-30 ENCOUNTER — Other Ambulatory Visit (INDEPENDENT_AMBULATORY_CARE_PROVIDER_SITE_OTHER): Payer: 59

## 2015-08-30 DIAGNOSIS — E785 Hyperlipidemia, unspecified: Secondary | ICD-10-CM

## 2015-08-30 DIAGNOSIS — Z1159 Encounter for screening for other viral diseases: Secondary | ICD-10-CM

## 2015-08-30 DIAGNOSIS — Z125 Encounter for screening for malignant neoplasm of prostate: Secondary | ICD-10-CM

## 2015-08-30 LAB — LIPID PANEL
CHOL/HDL RATIO: 4
Cholesterol: 175 mg/dL (ref 0–200)
HDL: 49.1 mg/dL (ref >39.00)
LDL Cholesterol: 104 mg/dL — ABNORMAL HIGH (ref 0–99)
NonHDL: 126.26
TRIGLYCERIDES: 110 mg/dL (ref 0.0–149.0)
VLDL: 22 mg/dL (ref 0.0–40.0)

## 2015-08-30 LAB — PSA: PSA: 0.32 ng/mL (ref 0.10–4.00)

## 2015-08-30 LAB — BASIC METABOLIC PANEL
BUN: 13 mg/dL (ref 6–23)
CHLORIDE: 106 meq/L (ref 96–112)
CO2: 28 meq/L (ref 19–32)
Calcium: 8.9 mg/dL (ref 8.4–10.5)
Creatinine, Ser: 0.8 mg/dL (ref 0.40–1.50)
GFR: 104.76 mL/min (ref >60.00)
Glucose, Bld: 94 mg/dL (ref 70–99)
POTASSIUM: 4.2 meq/L (ref 3.5–5.1)
Sodium: 140 mEq/L (ref 135–145)

## 2015-08-31 LAB — HEPATITIS C ANTIBODY: HCV Ab: NEGATIVE

## 2015-09-01 ENCOUNTER — Other Ambulatory Visit: Payer: Self-pay | Admitting: Family Medicine

## 2015-09-03 ENCOUNTER — Other Ambulatory Visit: Payer: 59

## 2015-09-10 ENCOUNTER — Other Ambulatory Visit: Payer: 59

## 2015-09-12 ENCOUNTER — Ambulatory Visit (INDEPENDENT_AMBULATORY_CARE_PROVIDER_SITE_OTHER): Payer: 59 | Admitting: Family Medicine

## 2015-09-12 ENCOUNTER — Encounter: Payer: Self-pay | Admitting: Family Medicine

## 2015-09-12 VITALS — BP 124/84 | HR 88 | Temp 98.1°F | Ht 65.75 in | Wt 201.8 lb

## 2015-09-12 DIAGNOSIS — M25541 Pain in joints of right hand: Secondary | ICD-10-CM

## 2015-09-12 DIAGNOSIS — G4733 Obstructive sleep apnea (adult) (pediatric): Secondary | ICD-10-CM | POA: Insufficient documentation

## 2015-09-12 DIAGNOSIS — R569 Unspecified convulsions: Secondary | ICD-10-CM

## 2015-09-12 DIAGNOSIS — Z9989 Dependence on other enabling machines and devices: Secondary | ICD-10-CM

## 2015-09-12 DIAGNOSIS — R739 Hyperglycemia, unspecified: Secondary | ICD-10-CM | POA: Diagnosis not present

## 2015-09-12 DIAGNOSIS — Z0000 Encounter for general adult medical examination without abnormal findings: Secondary | ICD-10-CM

## 2015-09-12 DIAGNOSIS — R0683 Snoring: Secondary | ICD-10-CM

## 2015-09-12 DIAGNOSIS — E785 Hyperlipidemia, unspecified: Secondary | ICD-10-CM | POA: Diagnosis not present

## 2015-09-12 DIAGNOSIS — E669 Obesity, unspecified: Secondary | ICD-10-CM

## 2015-09-12 DIAGNOSIS — M25542 Pain in joints of left hand: Secondary | ICD-10-CM

## 2015-09-12 HISTORY — DX: Obstructive sleep apnea (adult) (pediatric): G47.33

## 2015-09-12 HISTORY — DX: Obstructive sleep apnea (adult) (pediatric): Z99.89

## 2015-09-12 NOTE — Assessment & Plan Note (Signed)
ESS = 9. Pt declines sleep evaluation. Will monitor symptoms, work towards ongoing weight loss.

## 2015-09-12 NOTE — Assessment & Plan Note (Signed)
Discussed healthy diet and lifestyle changes to affect sustainable weight loss  

## 2015-09-12 NOTE — Assessment & Plan Note (Signed)
Minimal. Discussed healthy diet. Strong fmhx.

## 2015-09-12 NOTE — Assessment & Plan Note (Addendum)
Predominantly L 1st CMC and R 2nd/3rd digits. Glucosamine helping. Declines further evaluation at this time.

## 2015-09-12 NOTE — Assessment & Plan Note (Signed)
Stable off AED 

## 2015-09-12 NOTE — Assessment & Plan Note (Signed)
Preventative protocols reviewed and updated unless pt declined. Discussed healthy diet and lifestyle.  

## 2015-09-12 NOTE — Assessment & Plan Note (Signed)
Improved

## 2015-09-12 NOTE — Progress Notes (Signed)
BP 124/84 mmHg  Pulse 88  Temp(Src) 98.1 F (36.7 C)  Ht 5' 5.75" (1.67 m)  Wt 201 lb 12.8 oz (91.536 kg)  BMI 32.82 kg/m2  SpO2 97%   CC: CPE  Subjective:    Patient ID: Ricky Barton, male    DOB: 04-Nov-1955, 60 y.o.   MRN: [id number]  HPI: Ricky Barton is a 60 y.o. male presenting on 09/12/2015 for Annual Exam   Ongoing hand aches predominantly L 1st CMC. Glucosamine has helped some. Denies symptoms of synovitis.  Wife notes he's snoring at night time. Some non restorative sleeping. Breathe rite nasal strips are helpful. No daytime sleepiness. No PNDyspnea or apnea.   8lb weight loss over last year - attributed to healthy diet changes.   Preventative: Colonoscopy 03/2011 - 3 adenomatous polyps, rec rpt 5 yrs (Dr Fuller Plan). Prostate cancer screening - discussed, always normal <1 PSA, this year normal. Will defer DRE and rpt in 2017. Flu shot - today  Tetanus - 2010  Seat belt use discussed Sunscreen use discussed. No changing moles on skin.  Caffeine: occasional coffee/tea  Married and lives with wife, grown children, dog  Occupation: [employer]  Activity: no regular exercise, golfing, walks dog  Diet: good water, fruits/vegetables daily  Relevant past medical, surgical, family and social history reviewed and updated as indicated. Interim medical history since our last visit reviewed. Allergies and medications reviewed and updated. Current Outpatient Prescriptions on File Prior to Visit  Medication Sig  . aspirin 81 MG EC tablet Take 81 mg by mouth every Monday, Wednesday, and Friday. Swallow whole.  . ibuprofen (ADVIL,MOTRIN) 200 MG tablet Take 200 mg by mouth as needed.    . sildenafil (VIAGRA) 100 MG tablet TAKE 1/2 TO 1 TABLET BY MOUTH 1 HOUR PRIOR AS NEEDED   No current facility-administered medications on file prior to visit.    Review of Systems  Constitutional: Negative for fever, chills, activity change, appetite change, fatigue and unexpected  weight change.  HENT: Negative for hearing loss.   Eyes: Negative for visual disturbance.  Respiratory: Negative for cough, chest tightness, shortness of breath and wheezing.   Cardiovascular: Negative for chest pain, palpitations and leg swelling.  Gastrointestinal: Negative for nausea, vomiting, abdominal pain, diarrhea, constipation, blood in stool and abdominal distention.  Genitourinary: Negative for hematuria and difficulty urinating.  Musculoskeletal: Negative for myalgias, arthralgias and neck pain.  Skin: Negative for rash.  Neurological: Negative for dizziness, seizures, syncope and headaches.  Hematological: Negative for adenopathy. Does not bruise/bleed easily.  Psychiatric/Behavioral: Negative for dysphoric mood. The patient is not nervous/anxious.    Per HPI unless specifically indicated in ROS section     Objective:    BP 124/84 mmHg  Pulse 88  Temp(Src) 98.1 F (36.7 C)  Ht 5' 5.75" (1.67 m)  Wt 201 lb 12.8 oz (91.536 kg)  BMI 32.82 kg/m2  SpO2 97%  Wt Readings from Last 3 Encounters:  09/12/15 201 lb 12.8 oz (91.536 kg)  04/05/14 209 lb 12 oz (95.142 kg)  03/30/13 209 lb 8 oz (95.029 kg)    Physical Exam  Constitutional: He is oriented to person, place, and time. He appears well-developed and well-nourished. No distress.  HENT:  Head: Normocephalic and atraumatic.  Right Ear: Hearing, tympanic membrane, external ear and ear canal normal.  Left Ear: Hearing, tympanic membrane, external ear and ear canal normal.  Nose: Nose normal.  Mouth/Throat: Uvula is midline, oropharynx is clear and moist and mucous membranes  are normal. No oropharyngeal exudate, posterior oropharyngeal edema or posterior oropharyngeal erythema.  Eyes: Conjunctivae and EOM are normal. Pupils are equal, round, and reactive to light. No scleral icterus.  Neck: Normal range of motion. Neck supple. No thyromegaly present.  Cardiovascular: Normal rate, regular rhythm, normal heart sounds and  intact distal pulses.   No murmur heard. Pulses:      Radial pulses are 2+ on the right side, and 2+ on the left side.  Pulmonary/Chest: Effort normal and breath sounds normal. No respiratory distress. He has no wheezes. He has no rales.  Abdominal: Soft. Bowel sounds are normal. He exhibits no distension and no mass. There is no tenderness. There is no rebound and no guarding.  Genitourinary: Rectum normal and prostate normal. Rectal exam shows no external hemorrhoid, no internal hemorrhoid, no fissure, no mass, no tenderness and anal tone normal. Prostate is not enlarged (20gm) and not tender.  Musculoskeletal: Normal range of motion. He exhibits no edema.  Lymphadenopathy:    He has no cervical adenopathy.  Neurological: He is alert and oriented to person, place, and time.  CN grossly intact, station and gait intact  Skin: Skin is warm and dry. No rash noted.  Psychiatric: He has a normal mood and affect. His behavior is normal. Judgment and thought content normal.  Nursing note and vitals reviewed.  Results for orders placed or performed in visit on 08/30/15  Lipid panel  Result Value Ref Range   Cholesterol 175 0 - 200 mg/dL   Triglycerides 110.0 0.0 - 149.0 mg/dL   HDL 49.10 >39.00 mg/dL   VLDL 22.0 0.0 - 40.0 mg/dL   LDL Cholesterol 104 (H) 0 - 99 mg/dL   Total CHOL/HDL Ratio 4    NonHDL 126.26   PSA  Result Value Ref Range   PSA 0.32 0.10 - 4.00 ng/mL  Basic metabolic panel  Result Value Ref Range   Sodium 140 135 - 145 mEq/L   Potassium 4.2 3.5 - 5.1 mEq/L   Chloride 106 96 - 112 mEq/L   CO2 28 19 - 32 mEq/L   Glucose, Bld 94 70 - 99 mg/dL   BUN 13 6 - 23 mg/dL   Creatinine, Ser 0.80 0.40 - 1.50 mg/dL   Calcium 8.9 8.4 - 10.5 mg/dL   GFR 104.76 >60.00 mL/min  Hepatitis C antibody  Result Value Ref Range   HCV Ab NEGATIVE NEGATIVE      Assessment & Plan:   Problem List Items Addressed This Visit    HLD (hyperlipidemia)    Minimal. Discussed healthy diet.  Strong fmhx.       Seizures ([place])    Stable off AED      Healthcare maintenance - Primary    Preventative protocols reviewed and updated unless pt declined. Discussed healthy diet and lifestyle.       Hyperglycemia    Improved.       Obesity, Class I, BMI 30-34.9    Discussed healthy diet and lifestyle changes to affect sustainable weight loss      Arthralgia of hands, bilateral    Predominantly L 1st CMC and R 2nd/3rd digits. Glucosamine helping. Declines further evaluation at this time.       Snoring    ESS = 9. Pt declines sleep evaluation. Will monitor symptoms, work towards ongoing weight loss.          Follow up plan: Return in about 1 year (around 09/11/2016), or as needed, for annual exam, prior fasting  for blood work.  Ria Bush, MD

## 2015-09-12 NOTE — Patient Instructions (Signed)
You are doing well today Return as needed or in 1 year for next physical.  Health Maintenance, Male A healthy lifestyle and preventative care can promote health and wellness.  Maintain regular health, dental, and eye exams.  Eat a healthy diet. Foods like vegetables, fruits, whole grains, low-fat dairy products, and lean protein foods contain the nutrients you need and are low in calories. Decrease your intake of foods high in solid fats, added sugars, and salt. Get information about a proper diet from your health care provider, if necessary.  Regular physical exercise is one of the most important things you can do for your health. Most adults should get at least 150 minutes of moderate-intensity exercise (any activity that increases your heart rate and causes you to sweat) each week. In addition, most adults need muscle-strengthening exercises on 2 or more days a week.   Maintain a healthy weight. The body mass index (BMI) is a screening tool to identify possible weight problems. It provides an estimate of body fat based on height and weight. Your health care provider can find your BMI and can help you achieve or maintain a healthy weight. For males 20 years and older:  A BMI below 18.5 is considered underweight.  A BMI of 18.5 to 24.9 is normal.  A BMI of 25 to 29.9 is considered overweight.  A BMI of 30 and above is considered obese.  Maintain normal blood lipids and cholesterol by exercising and minimizing your intake of saturated fat. Eat a balanced diet with plenty of fruits and vegetables. Blood tests for lipids and cholesterol should begin at age 70 and be repeated every 5 years. If your lipid or cholesterol levels are high, you are over age 58, or you are at high risk for heart disease, you may need your cholesterol levels checked more frequently.Ongoing high lipid and cholesterol levels should be treated with medicines if diet and exercise are not working.  If you smoke, find out  from your health care provider how to quit. If you do not use tobacco, do not start.  Lung cancer screening is recommended for adults aged 20-80 years who are at high risk for developing lung cancer because of a history of smoking. A yearly low-dose CT scan of the lungs is recommended for people who have at least a 30-pack-year history of smoking and are current smokers or have quit within the past 15 years. A pack year of smoking is smoking an average of 1 pack of cigarettes a day for 1 year (for example, a 30-pack-year history of smoking could mean smoking 1 pack a day for 30 years or 2 packs a day for 15 years). Yearly screening should continue until the smoker has stopped smoking for at least 15 years. Yearly screening should be stopped for people who develop a health problem that would prevent them from having lung cancer treatment.  If you choose to drink alcohol, do not have more than 2 drinks per day. One drink is considered to be 12 oz (360 mL) of beer, 5 oz (150 mL) of wine, or 1.5 oz (45 mL) of liquor.  Avoid the use of street drugs. Do not share needles with anyone. Ask for help if you need support or instructions about stopping the use of drugs.  High blood pressure causes heart disease and increases the risk of stroke. High blood pressure is more likely to develop in:  People who have blood pressure in the end of the normal range (100-139/85-89  mm Hg).  People who are overweight or obese.  People who are African American.  If you are 34-44 years of age, have your blood pressure checked every 3-5 years. If you are 31 years of age or older, have your blood pressure checked every year. You should have your blood pressure measured twice--once when you are at a hospital or clinic, and once when you are not at a hospital or clinic. Record the average of the two measurements. To check your blood pressure when you are not at a hospital or clinic, you can use:  An automated blood pressure  machine at a pharmacy.  A home blood pressure monitor.  If you are 45-36 years old, ask your health care provider if you should take aspirin to prevent heart disease.  Diabetes screening involves taking a blood sample to check your fasting blood sugar level. This should be done once every 3 years after age 41 if you are at a normal weight and without risk factors for diabetes. Testing should be considered at a younger age or be carried out more frequently if you are overweight and have at least 1 risk factor for diabetes.  Colorectal cancer can be detected and often prevented. Most routine colorectal cancer screening begins at the age of 8 and continues through age 72. However, your health care provider may recommend screening at an earlier age if you have risk factors for colon cancer. On a yearly basis, your health care provider may provide home test kits to check for hidden blood in the stool. A small camera at the end of a tube may be used to directly examine the colon (sigmoidoscopy or colonoscopy) to detect the earliest forms of colorectal cancer. Talk to your health care provider about this at age 26 when routine screening begins. A direct exam of the colon should be repeated every 5-10 years through age 71, unless early forms of precancerous polyps or small growths are found.  People who are at an increased risk for hepatitis B should be screened for this virus. You are considered at high risk for hepatitis B if:  You were born in a country where hepatitis B occurs often. Talk with your health care provider about which countries are considered high risk.  Your parents were born in a high-risk country and you have not received a shot to protect against hepatitis B (hepatitis B vaccine).  You have HIV or AIDS.  You use needles to inject street drugs.  You live with, or have sex with, someone who has hepatitis B.  You are a man who has sex with other men (MSM).  You get hemodialysis  treatment.  You take certain medicines for conditions like cancer, organ transplantation, and autoimmune conditions.  Hepatitis C blood testing is recommended for all people born from [birth year] through 1965 and any individual with known risk factors for hepatitis C.  Healthy men should no longer receive prostate-specific antigen (PSA) blood tests as part of routine cancer screening. Talk to your health care provider about prostate cancer screening.  Testicular cancer screening is not recommended for adolescents or adult males who have no symptoms. Screening includes self-exam, a health care provider exam, and other screening tests. Consult with your health care provider about any symptoms you have or any concerns you have about testicular cancer.  Practice safe sex. Use condoms and avoid high-risk sexual practices to reduce the spread of sexually transmitted infections (STIs).  You should be screened for STIs, including gonorrhea  and chlamydia if:  You are sexually active and are younger than 24 years.  You are older than 24 years, and your health care provider tells you that you are at risk for this type of infection.  Your sexual activity has changed since you were last screened, and you are at an increased risk for chlamydia or gonorrhea. Ask your health care provider if you are at risk.  If you are at risk of being infected with HIV, it is recommended that you take a prescription medicine daily to prevent HIV infection. This is called pre-exposure prophylaxis (PrEP). You are considered at risk if:  You are a man who has sex with other men (MSM).  You are a heterosexual man who is sexually active with multiple partners.  You take drugs by injection.  You are sexually active with a partner who has HIV.  Talk with your health care provider about whether you are at high risk of being infected with HIV. If you choose to begin PrEP, you should first be tested for HIV. You should then be tested  every 3 months for as long as you are taking PrEP.  Use sunscreen. Apply sunscreen liberally and repeatedly throughout the day. You should seek shade when your shadow is shorter than you. Protect yourself by wearing long sleeves, pants, a wide-brimmed hat, and sunglasses year round whenever you are outdoors.  Tell your health care provider of new moles or changes in moles, especially if there is a change in shape or color. Also, tell your health care provider if a mole is larger than the size of a pencil eraser.  A one-time screening for abdominal aortic aneurysm (AAA) and surgical repair of large AAAs by ultrasound is recommended for men aged 8-75 years who are current or former smokers.  Stay current with your vaccines (immunizations).   This information is not intended to replace advice given to you by your health care provider. Make sure you discuss any questions you have with your health care provider.   Document Released: 08/29/2007 Document Revised: 03/23/2014 Document Reviewed: 07/28/2010 Elsevier Interactive Patient Education [employer].

## 2016-02-05 ENCOUNTER — Encounter: Payer: Self-pay | Admitting: Gastroenterology

## 2016-05-29 ENCOUNTER — Encounter: Payer: Self-pay | Admitting: Gastroenterology

## 2016-07-14 HISTORY — PX: COLONOSCOPY: SHX174

## 2016-07-20 ENCOUNTER — Ambulatory Visit (AMBULATORY_SURGERY_CENTER): Payer: Self-pay

## 2016-07-20 VITALS — Ht 66.0 in | Wt 210.0 lb

## 2016-07-20 DIAGNOSIS — Z86010 Personal history of colonic polyps: Secondary | ICD-10-CM

## 2016-07-20 MED ORDER — NA SULFATE-K SULFATE-MG SULF 17.5-3.13-1.6 GM/177ML PO SOLN: 1.0000 | Freq: Once | ORAL | 0 refills | Status: AC

## 2016-07-20 NOTE — Progress Notes (Signed)
Denies allergies to eggs or soy products. Denies complication of anesthesia or sedation. Denies use of weight loss medication. Denies use of O2.   Emmi instructions given for colonoscopy.  

## 2016-07-21 ENCOUNTER — Encounter: Payer: Self-pay | Admitting: Gastroenterology

## 2016-08-03 ENCOUNTER — Encounter: Payer: Self-pay | Admitting: Gastroenterology

## 2016-08-03 ENCOUNTER — Ambulatory Visit (AMBULATORY_SURGERY_CENTER): Payer: 59 | Admitting: Gastroenterology

## 2016-08-03 VITALS — BP 128/83 | HR 75 | Temp 99.1°F | Resp 15 | Ht 66.0 in | Wt 210.0 lb

## 2016-08-03 DIAGNOSIS — R569 Unspecified convulsions: Secondary | ICD-10-CM | POA: Diagnosis not present

## 2016-08-03 DIAGNOSIS — Z86010 Personal history of colonic polyps: Secondary | ICD-10-CM | POA: Diagnosis present

## 2016-08-03 MED ORDER — SODIUM CHLORIDE 0.9 % IV SOLN: 500.0000 mL | INTRAVENOUS | Status: DC

## 2016-08-03 NOTE — Op Note (Signed)
[place] Patient Name: Ricky Barton Procedure Date: 08/03/2016 10:23 AM MRN: [id number] Endoscopist: Ladene Artist , MD Age: 61 Referring MD:  Date of Birth: 1955-12-31 Gender: Male Account #: [id number] Procedure:                Colonoscopy Indications:              Surveillance: Personal history of adenomatous                            polyps on last colonoscopy 5 years ago Medicines:                Monitored Anesthesia Care Procedure:                Pre-Anesthesia Assessment:                           - Prior to the procedure, a History and Physical                            was performed, and patient medications and                            allergies were reviewed. The patient's tolerance of                            previous anesthesia was also reviewed. The risks                            and benefits of the procedure and the sedation                            options and risks were discussed with the patient.                            All questions were answered, and informed consent                            was obtained. Prior Anticoagulants: The patient has                            taken no previous anticoagulant or antiplatelet                            agents. ASA Grade Assessment: II - A patient with                            mild systemic disease. After reviewing the risks                            and benefits, the patient was deemed in                            satisfactory condition to undergo the procedure.  After obtaining informed consent, the colonoscope                            was passed under direct vision. Throughout the                            procedure, the patient's blood pressure, pulse, and                            oxygen saturations were monitored continuously. The                            Model PCF-[id number]) scope was introduced                            through the anus and  advanced to the the cecum,                            identified by appendiceal orifice and ileocecal                            valve. The ileocecal valve, appendiceal orifice,                            and rectum were photographed. The quality of the                            bowel preparation was good. The colonoscopy was                            performed without difficulty. The patient tolerated                            the procedure well. Scope In: 10:30:49 AM Scope Out: 10:42:38 AM Scope Withdrawal Time: 0 hours 10 minutes 11 seconds  Total Procedure Duration: 0 hours 11 minutes 49 seconds  Findings:                 The perianal and digital rectal examinations were                            normal.                           The entire examined colon appeared normal on direct                            and retroflexion views. Complications:            No immediate complications. Estimated blood loss:                            None. Estimated Blood Loss:     Estimated blood loss: none. Impression:               - The entire examined colon is normal on direct  and                            retroflexion views.                           - No specimens collected. Recommendation:           - Repeat colonoscopy in 5 years for surveillance.                           - Patient has a contact number available for                            emergencies. The signs and symptoms of potential                            delayed complications were discussed with the                            patient. Return to normal activities tomorrow.                            Written discharge instructions were provided to the                            patient.                           - Resume previous diet.                           - Continue present medications. Ladene Artist, MD 08/03/2016 10:47:58 AM This report has been signed electronically.

## 2016-08-03 NOTE — Patient Instructions (Signed)
Impression/Recommendations:  Repeat colonoscopy in 5 years for surveillance.  Resume previous diet. Continue present medications.  YOU HAD AN ENDOSCOPIC PROCEDURE TODAY AT THE [facility]:   Refer to the procedure report that was given to you for any specific questions about what was found during the examination.  If the procedure report does not answer your questions, please call your gastroenterologist to clarify.  If you requested that your care partner not be given the details of your procedure findings, then the procedure report has been included in a sealed envelope for you to review at your convenience later.  YOU SHOULD EXPECT: Some feelings of bloating in the abdomen. Passage of more gas than usual.  Walking can help get rid of the air that was put into your GI tract during the procedure and reduce the bloating. If you had a lower endoscopy (such as a colonoscopy or flexible sigmoidoscopy) you may notice spotting of blood in your stool or on the toilet paper. If you underwent a bowel prep for your procedure, you may not have a normal bowel movement for a few days.  Please Note:  You might notice some irritation and congestion in your nose or some drainage.  This is from the oxygen used during your procedure.  There is no need for concern and it should clear up in a day or so.  SYMPTOMS TO REPORT IMMEDIATELY:   Following lower endoscopy (colonoscopy or flexible sigmoidoscopy):  Excessive amounts of blood in the stool  Significant tenderness or worsening of abdominal pains  Swelling of the abdomen that is new, acute  Fever of 100F or higher For urgent or emergent issues, a gastroenterologist can be reached at any hour by calling [phone].   DIET:  We do recommend a small meal at first, but then you may proceed to your regular diet.  Drink plenty of fluids but you should avoid alcoholic beverages for 24 hours.  ACTIVITY:  You should plan to take it easy for the  rest of today and you should NOT DRIVE or use heavy machinery until tomorrow (because of the sedation medicines used during the test).    FOLLOW UP: Our staff will call the number listed on your records the next business day following your procedure to check on you and address any questions or concerns that you may have regarding the information given to you following your procedure. If we do not reach you, we will leave a message.  However, if you are feeling well and you are not experiencing any problems, there is no need to return our call.  We will assume that you have returned to your regular daily activities without incident.  If any biopsies were taken you will be contacted by phone or by letter within the next 1-3 weeks.  Please call us at [phone] if you have not heard about the biopsies in 3 weeks.    SIGNATURES/CONFIDENTIALITY: You and/or your care partner have signed paperwork which will be entered into your electronic medical record.  These signatures attest to the fact that that the information above on your After Visit Summary has been reviewed and is understood.  Full responsibility of the confidentiality of this discharge information lies with you and/or your care-partner. 

## 2016-08-03 NOTE — Progress Notes (Signed)
Report given to PACU, vss 

## 2016-08-04 ENCOUNTER — Telehealth: Payer: Self-pay | Admitting: *Deleted

## 2016-08-04 NOTE — Telephone Encounter (Signed)
  Follow up Call-  Call back number 08/03/2016  Post procedure Call Back phone  # [phone]  Permission to leave phone message Yes  Some recent data might be hidden     Patient questions:  Do you have a fever, pain , or abdominal swelling? No. Pain Score  0 *  Have you tolerated food without any problems? Yes.    Have you been able to return to your normal activities? Yes.    Do you have any questions about your discharge instructions: Diet   No. Medications  No. Follow up visit  No.  Do you have questions or concerns about your Care? No.  Actions: * If pain score is 4 or above: No action needed, pain <4.

## 2016-08-05 ENCOUNTER — Encounter: Payer: Self-pay | Admitting: Family Medicine

## 2016-09-11 ENCOUNTER — Other Ambulatory Visit: Payer: 59

## 2016-09-17 ENCOUNTER — Encounter: Payer: 59 | Admitting: Family Medicine

## 2016-09-24 ENCOUNTER — Encounter: Payer: 59 | Admitting: Family Medicine

## 2016-09-30 ENCOUNTER — Other Ambulatory Visit: Payer: Self-pay | Admitting: Family Medicine

## 2016-09-30 DIAGNOSIS — E785 Hyperlipidemia, unspecified: Secondary | ICD-10-CM

## 2016-09-30 DIAGNOSIS — Z125 Encounter for screening for malignant neoplasm of prostate: Secondary | ICD-10-CM

## 2016-09-30 DIAGNOSIS — R739 Hyperglycemia, unspecified: Secondary | ICD-10-CM

## 2016-10-02 ENCOUNTER — Other Ambulatory Visit (INDEPENDENT_AMBULATORY_CARE_PROVIDER_SITE_OTHER): Payer: 59

## 2016-10-02 DIAGNOSIS — R739 Hyperglycemia, unspecified: Secondary | ICD-10-CM

## 2016-10-02 DIAGNOSIS — E785 Hyperlipidemia, unspecified: Secondary | ICD-10-CM

## 2016-10-02 DIAGNOSIS — Z125 Encounter for screening for malignant neoplasm of prostate: Secondary | ICD-10-CM

## 2016-10-02 LAB — BASIC METABOLIC PANEL
BUN: 14 mg/dL (ref 6–23)
CALCIUM: 9.2 mg/dL (ref 8.4–10.5)
CO2: 29 mEq/L (ref 19–32)
CREATININE: 0.78 mg/dL (ref 0.40–1.50)
Chloride: 104 mEq/L (ref 96–112)
GFR: 107.47 mL/min (ref >60.00)
GLUCOSE: 101 mg/dL — AB (ref 70–99)
Potassium: 4.6 mEq/L (ref 3.5–5.1)
Sodium: 140 mEq/L (ref 135–145)

## 2016-10-02 LAB — LIPID PANEL
CHOL/HDL RATIO: 3
Cholesterol: 162 mg/dL (ref 0–200)
HDL: 48.5 mg/dL (ref >39.00)
LDL Cholesterol: 90 mg/dL (ref 0–99)
NonHDL: 113.94
TRIGLYCERIDES: 118 mg/dL (ref 0.0–149.0)
VLDL: 23.6 mg/dL (ref 0.0–40.0)

## 2016-10-02 LAB — PSA: PSA: 0.38 ng/mL (ref 0.10–4.00)

## 2016-10-06 ENCOUNTER — Ambulatory Visit (INDEPENDENT_AMBULATORY_CARE_PROVIDER_SITE_OTHER): Payer: 59 | Admitting: Family Medicine

## 2016-10-06 ENCOUNTER — Encounter: Payer: Self-pay | Admitting: Family Medicine

## 2016-10-06 VITALS — BP 138/88 | HR 102 | Temp 98.3°F | Ht 65.75 in | Wt 200.0 lb

## 2016-10-06 DIAGNOSIS — E669 Obesity, unspecified: Secondary | ICD-10-CM

## 2016-10-06 DIAGNOSIS — Z0000 Encounter for general adult medical examination without abnormal findings: Secondary | ICD-10-CM | POA: Diagnosis not present

## 2016-10-06 DIAGNOSIS — R0683 Snoring: Secondary | ICD-10-CM | POA: Diagnosis not present

## 2016-10-06 DIAGNOSIS — R739 Hyperglycemia, unspecified: Secondary | ICD-10-CM

## 2016-10-06 DIAGNOSIS — M79671 Pain in right foot: Secondary | ICD-10-CM | POA: Diagnosis not present

## 2016-10-06 DIAGNOSIS — M25562 Pain in left knee: Secondary | ICD-10-CM | POA: Insufficient documentation

## 2016-10-06 DIAGNOSIS — E785 Hyperlipidemia, unspecified: Secondary | ICD-10-CM | POA: Diagnosis not present

## 2016-10-06 DIAGNOSIS — R569 Unspecified convulsions: Secondary | ICD-10-CM

## 2016-10-06 DIAGNOSIS — E66811 Obesity, class 1: Secondary | ICD-10-CM

## 2016-10-06 DIAGNOSIS — G8929 Other chronic pain: Secondary | ICD-10-CM | POA: Insufficient documentation

## 2016-10-06 DIAGNOSIS — M25561 Pain in right knee: Secondary | ICD-10-CM | POA: Diagnosis not present

## 2016-10-06 NOTE — Progress Notes (Signed)
BP 138/88 (BP Location: Left Arm, Patient Position: Sitting, Cuff Size: Large)   Pulse (!) 102   Temp 98.3 F (36.8 C) (Oral)   Ht 5' 5.75" (1.67 m)   Wt 200 lb (90.7 kg)   SpO2 97%   BMI 32.53 kg/m    CC: CPE Subjective:    Patient ID: Ricky Barton, male    DOB: Aug 08, 1955, 61 y.o.   MRN: [id number]  HPI: Ricky Barton is a 61 y.o. male presenting on 10/06/2016 for Annual Exam   Ongoing intermittent R hip, knee, heel pain. Ongoing for the past year. Denies inciting trauma/injury. Longstanding issue. No locking or instability of knee.   Preventative: Colonoscopy 07/2015 - WNL, rpt 5 yrs [facility] Plan) Prostate cancer screening - discussed, always normal <1 PSA. Will do DRE QOyear. Flu shot yearly Tetanus - 2010  Seat belt use discussed Sunscreen use discussed. No changing moles on skin. Non smoker Alcohol - none  Caffeine: occasional coffee/tea  Married and lives with wife, grown children, dogs Occupation: [employer]  Activity: no regular exercise, golfing, walks dogs 1/2 mile or more  Diet: good water, fruits/vegetables daily   Relevant past medical, surgical, family and social history reviewed and updated as indicated. Interim medical history since our last visit reviewed. Allergies and medications reviewed and updated. Outpatient Medications Prior to Visit  Medication Sig Dispense Refill  . aspirin 81 MG EC tablet Take 81 mg by mouth every Monday, Wednesday, and Friday. Swallow whole.    . ibuprofen (ADVIL,MOTRIN) 200 MG tablet Take 200 mg by mouth as needed.      . sildenafil (VIAGRA) 100 MG tablet TAKE 1/2 TO 1 TABLET BY MOUTH 1 HOUR PRIOR AS NEEDED 9 tablet 6  . glucosamine-chondroitin 500-400 MG tablet Take 1 tablet by mouth 2 (two) times daily.     Facility-Administered Medications Prior to Visit  Medication Dose Route Frequency Provider Last Rate Last Dose  . 0.9 %  sodium chloride infusion  500 mL Intravenous Continuous Ladene Artist, MD          Per HPI unless specifically indicated in ROS section below Review of Systems  Constitutional: Negative for activity change, appetite change, chills, fatigue, fever and unexpected weight change.  HENT: Negative for hearing loss.   Eyes: Negative for visual disturbance.  Respiratory: Negative for cough, chest tightness, shortness of breath and wheezing.   Cardiovascular: Negative for chest pain, palpitations and leg swelling.  Gastrointestinal: Negative for abdominal distention, abdominal pain, blood in stool, constipation, diarrhea, nausea and vomiting.  Genitourinary: Negative for difficulty urinating and hematuria.  Musculoskeletal: Negative for arthralgias, myalgias and neck pain.  Skin: Negative for rash.  Neurological: Negative for dizziness, seizures, syncope and headaches.  Hematological: Negative for adenopathy. Does not bruise/bleed easily.  Psychiatric/Behavioral: Negative for dysphoric mood. The patient is not nervous/anxious.        Objective:    BP 138/88 (BP Location: Left Arm, Patient Position: Sitting, Cuff Size: Large)   Pulse (!) 102   Temp 98.3 F (36.8 C) (Oral)   Ht 5' 5.75" (1.67 m)   Wt 200 lb (90.7 kg)   SpO2 97%   BMI 32.53 kg/m   Wt Readings from Last 3 Encounters:  10/06/16 200 lb (90.7 kg)  08/03/16 210 lb (95.3 kg)  07/20/16 210 lb (95.3 kg)    Physical Exam  Constitutional: He is oriented to person, place, and time. He appears well-developed and well-nourished. No distress.  HENT:  Head: Normocephalic  and atraumatic.  Right Ear: Hearing, tympanic membrane, external ear and ear canal normal.  Left Ear: Hearing, tympanic membrane, external ear and ear canal normal.  Nose: Nose normal.  Mouth/Throat: Uvula is midline, oropharynx is clear and moist and mucous membranes are normal. No oropharyngeal exudate, posterior oropharyngeal edema or posterior oropharyngeal erythema.  Eyes: Pupils are equal, round, and reactive to light. Conjunctivae and EOM  are normal. No scleral icterus.  Neck: Normal range of motion. Neck supple. Carotid bruit is not present. No thyromegaly present.  Cardiovascular: Normal rate, regular rhythm, normal heart sounds and intact distal pulses.   No murmur heard. Pulses:      Radial pulses are 2+ on the right side, and 2+ on the left side.  Pulmonary/Chest: Effort normal and breath sounds normal. No respiratory distress. He has no wheezes. He has no rales.  Abdominal: Soft. Bowel sounds are normal. He exhibits no distension and no mass. There is no tenderness. There is no rebound and no guarding.  Genitourinary:  Genitourinary Comments: DRE deferred this year  Musculoskeletal: Normal range of motion. He exhibits no edema.  L leg WNL R hip - no pain with int/ext rotation R Knee exam: No deformity on inspection. No pain with palpation of knee landmarks. No effusion/swelling noted. FROM in flex/extension WITH crepitus. No popliteal fullness. Neg drawer test. Neg mcmurray test. No pain with valgus/varus stress. No PFgrind. No abnormal patellar mobility.  R heel - tender to palpation at proximal heel sole  Lymphadenopathy:    He has no cervical adenopathy.  Neurological: He is alert and oriented to person, place, and time.  CN grossly intact, station and gait intact  Skin: Skin is warm and dry. No rash noted.  Psychiatric: He has a normal mood and affect. His behavior is normal. Judgment and thought content normal.  Nursing note and vitals reviewed.  Results for orders placed or performed in visit on 10/02/16  Lipid panel  Result Value Ref Range   Cholesterol 162 0 - 200 mg/dL   Triglycerides 118.0 0.0 - 149.0 mg/dL   HDL 48.50 >39.00 mg/dL   VLDL 23.6 0.0 - 40.0 mg/dL   LDL Cholesterol 90 0 - 99 mg/dL   Total CHOL/HDL Ratio 3    NonHDL 053.97   Basic metabolic panel  Result Value Ref Range   Sodium 140 135 - 145 mEq/L   Potassium 4.6 3.5 - 5.1 mEq/L   Chloride 104 96 - 112 mEq/L   CO2 29 19 - 32  mEq/L   Glucose, Bld 101 (H) 70 - 99 mg/dL   BUN 14 6 - 23 mg/dL   Creatinine, Ser 0.78 0.40 - 1.50 mg/dL   Calcium 9.2 8.4 - 10.5 mg/dL   GFR 107.47 >60.00 mL/min  PSA  Result Value Ref Range   PSA 0.38 0.10 - 4.00 ng/mL      Assessment & Plan:   Problem List Items Addressed This Visit    Healthcare maintenance - Primary    Preventative protocols reviewed and updated unless pt declined. Discussed healthy diet and lifestyle.       HLD (hyperlipidemia)    Chronic, stable of meds. fmhx CAD however brothers involved were smokers.  The 10-year ASCVD risk score [name] DC Jr., et al., 2013) is: 9%   Values used to calculate the score:     Age: 4 years     Sex: Male     Is Non-Hispanic African American: No     Diabetic: No  Tobacco smoker: No     Systolic Blood Pressure: 183 mmHg     Is BP treated: No     HDL Cholesterol: 48.5 mg/dL     Total Cholesterol: 162 mg/dL       Hyperglycemia    Mildly elevated this year.       Obesity, Class I, BMI 30-34.9    Discussed healthy diet and lifestyle changes to affect sustainable weight loss.       Pain of right heel    Anticipate plantar fasciitis - reviewed conservative care including frozen water bottle massage, stretching exercises, NSAID and gel heel inserts for shoes. Discussed importance of supportive shoeware.       Right knee pain    Anticipate mild DJD. Supportive care reviewed.       Seizures ([place])    Remote, stable off AED without recurrence.       Snoring    Wears breathe rite strips which help.           Follow up plan: Return in about 1 year (around 10/06/2017) for annual exam, prior fasting for blood work.  Ria Bush, MD

## 2016-10-06 NOTE — Assessment & Plan Note (Signed)
Mildly elevated this year.

## 2016-10-06 NOTE — Patient Instructions (Addendum)
Return as needed or in 1 year for next physical Continue healthy diet and activity. For heel - do exercises provided and discussed, use frozen water bottle to massage bottom of foot, get gel heel inserts for shoes, and may use ibuprofen as needed.   Health Maintenance, Male A healthy lifestyle and preventive care is important for your health and wellness. Ask your health care provider about what schedule of regular examinations is right for you. What should I know about weight and diet? Eat a Healthy Diet  Eat plenty of vegetables, fruits, whole grains, low-fat dairy products, and lean protein.  Do not eat a lot of foods high in solid fats, added sugars, or salt.  Maintain a Healthy Weight Regular exercise can help you achieve or maintain a healthy weight. You should:  Do at least 150 minutes of exercise each week. The exercise should increase your heart rate and make you sweat (moderate-intensity exercise).  Do strength-training exercises at least twice a week.  Watch Your Levels of Cholesterol and Blood Lipids  Have your blood tested for lipids and cholesterol every 5 years starting at 61 years of age. If you are at high risk for heart disease, you should start having your blood tested when you are 61 years old. You may need to have your cholesterol levels checked more often if: ? Your lipid or cholesterol levels are high. ? You are older than 61 years of age. ? You are at high risk for heart disease.  What should I know about cancer screening? Many types of cancers can be detected early and may often be prevented. Lung Cancer  You should be screened every year for lung cancer if: ? You are a current smoker who has smoked for at least 30 years. ? You are a former smoker who has quit within the past 15 years.  Talk to your health care provider about your screening options, when you should start screening, and how often you should be screened.  Colorectal Cancer  Routine  colorectal cancer screening usually begins at 61 years of age and should be repeated every 5-10 years until you are 61 years old. You may need to be screened more often if early forms of precancerous polyps or small growths are found. Your health care provider may recommend screening at an earlier age if you have risk factors for colon cancer.  Your health care provider may recommend using home test kits to check for hidden blood in the stool.  A small camera at the end of a tube can be used to examine your colon (sigmoidoscopy or colonoscopy). This checks for the earliest forms of colorectal cancer.  Prostate and Testicular Cancer  Depending on your age and overall health, your health care provider may do certain tests to screen for prostate and testicular cancer.  Talk to your health care provider about any symptoms or concerns you have about testicular or prostate cancer.  Skin Cancer  Check your skin from head to toe regularly.  Tell your health care provider about any new moles or changes in moles, especially if: ? There is a change in a mole's size, shape, or color. ? You have a mole that is larger than a pencil eraser.  Always use sunscreen. Apply sunscreen liberally and repeat throughout the day.  Protect yourself by wearing long sleeves, pants, a wide-brimmed hat, and sunglasses when outside.  What should I know about heart disease, diabetes, and high blood pressure?  If you are 18-39  years of age, have your blood pressure checked every 3-5 years. If you are 54 years of age or older, have your blood pressure checked every year. You should have your blood pressure measured twice-once when you are at a hospital or clinic, and once when you are not at a hospital or clinic. Record the average of the two measurements. To check your blood pressure when you are not at a hospital or clinic, you can use: ? An automated blood pressure machine at a pharmacy. ? A home blood pressure  monitor.  Talk to your health care provider about your target blood pressure.  If you are between 58-28 years old, ask your health care provider if you should take aspirin to prevent heart disease.  Have regular diabetes screenings by checking your fasting blood sugar level. ? If you are at a normal weight and have a low risk for diabetes, have this test once every three years after the age of 34. ? If you are overweight and have a high risk for diabetes, consider being tested at a younger age or more often.  A one-time screening for abdominal aortic aneurysm (AAA) by ultrasound is recommended for men aged 39-75 years who are current or former smokers. What should I know about preventing infection? Hepatitis B If you have a higher risk for hepatitis B, you should be screened for this virus. Talk with your health care provider to find out if you are at risk for hepatitis B infection. Hepatitis C Blood testing is recommended for:  Everyone born from [birth year] through 1965.  Anyone with known risk factors for hepatitis C.  Sexually Transmitted Diseases (STDs)  You should be screened each year for STDs including gonorrhea and chlamydia if: ? You are sexually active and are younger than 61 years of age. ? You are older than 61 years of age and your health care provider tells you that you are at risk for this type of infection. ? Your sexual activity has changed since you were last screened and you are at an increased risk for chlamydia or gonorrhea. Ask your health care provider if you are at risk.  Talk with your health care provider about whether you are at high risk of being infected with HIV. Your health care provider may recommend a prescription medicine to help prevent HIV infection.  What else can I do?  Schedule regular health, dental, and eye exams.  Stay current with your vaccines (immunizations).  Do not use any tobacco products, such as cigarettes, chewing tobacco, and  e-cigarettes. If you need help quitting, ask your health care provider.  Limit alcohol intake to no more than 2 drinks per day. One drink equals 12 ounces of beer, 5 ounces of wine, or 1 ounces of hard liquor.  Do not use street drugs.  Do not share needles.  Ask your health care provider for help if you need support or information about quitting drugs.  Tell your health care provider if you often feel depressed.  Tell your health care provider if you have ever been abused or do not feel safe at home. This information is not intended to replace advice given to you by your health care provider. Make sure you discuss any questions you have with your health care provider. Document Released: 08/29/2007 Document Revised: 10/30/2015 Document Reviewed: 12/04/2014 Elsevier Interactive Patient Education  [employer].

## 2016-10-06 NOTE — Assessment & Plan Note (Signed)
Preventative protocols reviewed and updated unless pt declined. Discussed healthy diet and lifestyle.  

## 2016-10-06 NOTE — Assessment & Plan Note (Signed)
Anticipate plantar fasciitis - reviewed conservative care including frozen water bottle massage, stretching exercises, NSAID and gel heel inserts for shoes. Discussed importance of supportive shoeware.

## 2016-10-06 NOTE — Assessment & Plan Note (Signed)
Remote, stable off AED without recurrence.

## 2016-10-06 NOTE — Assessment & Plan Note (Addendum)
Chronic, stable of meds. fmhx CAD however brothers involved were smokers.  The 10-year ASCVD risk score [name] DC Brooke Bonito., et al., 2013) is: 9%   Values used to calculate the score:     Age: 61 years     Sex: Male     Is Non-Hispanic African American: No     Diabetic: No     Tobacco smoker: No     Systolic Blood Pressure: 505 mmHg     Is BP treated: No     HDL Cholesterol: 48.5 mg/dL     Total Cholesterol: 162 mg/dL

## 2016-10-06 NOTE — Assessment & Plan Note (Signed)
Discussed healthy diet and lifestyle changes to affect sustainable weight loss  

## 2016-10-06 NOTE — Assessment & Plan Note (Signed)
Wears breathe rite strips which help.

## 2016-10-06 NOTE — Assessment & Plan Note (Signed)
Anticipate mild DJD. Supportive care reviewed.

## 2017-01-07 DIAGNOSIS — H2513 Age-related nuclear cataract, bilateral: Secondary | ICD-10-CM | POA: Diagnosis not present

## 2017-05-30 ENCOUNTER — Encounter: Payer: Self-pay | Admitting: Emergency Medicine

## 2017-05-30 ENCOUNTER — Emergency Department: Payer: 59

## 2017-05-30 DIAGNOSIS — S0101XA Laceration without foreign body of scalp, initial encounter: Secondary | ICD-10-CM | POA: Diagnosis not present

## 2017-05-30 DIAGNOSIS — W108XXA Fall (on) (from) other stairs and steps, initial encounter: Secondary | ICD-10-CM | POA: Insufficient documentation

## 2017-05-30 DIAGNOSIS — Y93K1 Activity, walking an animal: Secondary | ICD-10-CM | POA: Diagnosis not present

## 2017-05-30 DIAGNOSIS — Y999 Unspecified external cause status: Secondary | ICD-10-CM | POA: Diagnosis not present

## 2017-05-30 DIAGNOSIS — Y92009 Unspecified place in unspecified non-institutional (private) residence as the place of occurrence of the external cause: Secondary | ICD-10-CM | POA: Insufficient documentation

## 2017-05-30 DIAGNOSIS — Z7982 Long term (current) use of aspirin: Secondary | ICD-10-CM | POA: Diagnosis not present

## 2017-05-30 DIAGNOSIS — S0990XA Unspecified injury of head, initial encounter: Secondary | ICD-10-CM | POA: Diagnosis not present

## 2017-05-30 DIAGNOSIS — S0003XA Contusion of scalp, initial encounter: Secondary | ICD-10-CM | POA: Diagnosis not present

## 2017-05-30 NOTE — ED Triage Notes (Addendum)
Pt comes into the ED via POV c/o a fall down the stairs after his dog pulled him down them.  Patient denies LOC, or blood thinner use.  Patient has laceration present to the top of the head.  All bleeding under control at this time.  Patient is alert and oriented x4.  Patient states he feels as though he may have hit the side of his face on the bottom carpeted stair.  Patient in NAD with even and unlabored respirations. Hematoma present to the right side of the head.

## 2017-05-31 ENCOUNTER — Emergency Department
Admission: EM | Admit: 2017-05-31 | Discharge: 2017-05-31 | Disposition: A | Discharge status: Home / Self Care | Payer: 59 | Attending: Emergency Medicine | Admitting: Emergency Medicine

## 2017-05-31 DIAGNOSIS — S0101XA Laceration without foreign body of scalp, initial encounter: Secondary | ICD-10-CM

## 2017-05-31 DIAGNOSIS — W19XXXA Unspecified fall, initial encounter: Secondary | ICD-10-CM

## 2017-05-31 DIAGNOSIS — S0990XA Unspecified injury of head, initial encounter: Secondary | ICD-10-CM

## 2017-05-31 MED ORDER — IBUPROFEN 400 MG PO TABS
400.0000 mg | ORAL_TABLET | Freq: Once | ORAL | Status: AC | PRN
  Administered 2017-05-31: 400 mg via ORAL
  Filled 2017-05-31: qty 1

## 2017-05-31 MED ORDER — HYDROCODONE-ACETAMINOPHEN 5-325 MG PO TABS: 1.0000 | ORAL_TABLET | Freq: Four times a day (QID) | ORAL | 0 refills | Status: DC | PRN

## 2017-05-31 MED ORDER — ONDANSETRON 4 MG PO TBDP: 4.0000 mg | ORAL_TABLET | Freq: Three times a day (TID) | ORAL | 0 refills | Status: DC | PRN

## 2017-05-31 NOTE — Discharge Instructions (Signed)
1.  Staple removal in 7-10 days. 2.  You may take ibuprofen and/or Tylenol as needed for pain.  You may take Norco as needed for stronger pain and Zofran as needed for nausea. 3.  Return to the ER for worsening symptoms, persistent vomiting, lethargy or other concerns.

## 2017-05-31 NOTE — ED Notes (Signed)
Patient updated.

## 2017-05-31 NOTE — ED Notes (Addendum)
Patient head wound cleaned and irrigated prior to MD examination

## 2017-05-31 NOTE — ED Notes (Signed)
Pt updated on wait. C/O soreness to his head. Reports pain about a 2/10. Will give protocol ibuprofen. Pt states that is what he would take at home.

## 2017-05-31 NOTE — ED Provider Notes (Signed)
[facility] Emergency Department Provider Note   ____________________________________________   First MD Initiated Contact with Patient 05/31/17 [phone]     (approximate)  I have reviewed the triage vital signs and the nursing notes.   HISTORY  Chief Complaint Fall and Laceration    HPI Ricky Barton is a 62 y.o. male who presents to the ED from home with a chief complaint of fall and minor head injury.  Reports he lost his balance when his dog pulled him down and fell down 5 steps.  Struck the right side of his head.  Denies LOC.  Denies neck pain, vision changes, chest pain, shortness of breath, abdominal pain, nausea, vomiting, dizziness.  Denies recent travel.  Denies use of anticoagulants.  Tetanus is up-to-date.   Past Medical History:  Diagnosis Date  . Arthritis   . Hypoglycemic syndrome   . Seizures ([place]) 1990s   isolated, none since, was on dilantin for 3 yrs, then stopped    Patient Active Problem List   Diagnosis Date Noted  . Pain of right heel 10/06/2016  . Right knee pain 10/06/2016  . Arthralgia of hands, bilateral 09/12/2015  . Snoring 09/12/2015  . Obesity, Class I, BMI 30-34.9 04/05/2014  . Hyperglycemia 03/30/2013  . Healthcare maintenance 03/04/2012  . HLD (hyperlipidemia) 01/18/2009  . 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 Seizures ([place]) 08/15/1991    Past Surgical History:  Procedure Laterality Date  . CHOLECYSTECTOMY  1989  . COLONOSCOPY  03/2011   3 adenomatous polyps, rec rpt 5 yrs (Dr Fuller Plan).  . COLONOSCOPY  07/2015   WNL, rpt 5 yrs Fuller Plan)  . Head MRI  06/02  . POLYPECTOMY      Prior to Admission medications   Medication Sig Start Date End Date Taking? Authorizing Provider  aspirin 81 MG EC tablet Take 81 mg by mouth every Monday, Wednesday, and Friday. Swallow whole.    [provider]  HYDROcodone-acetaminophen (NORCO) 5-325 MG tablet Take 1 tablet by mouth every 6 (six) hours as needed  for moderate pain. 05/31/17   Paulette Blanch, MD  ibuprofen (ADVIL,MOTRIN) 200 MG tablet Take 200 mg by mouth as needed.      [provider]  ondansetron (ZOFRAN ODT) 4 MG disintegrating tablet Take 1 tablet (4 mg total) by mouth every 8 (eight) hours as needed for nausea or vomiting. 05/31/17   Paulette Blanch, MD  sildenafil (VIAGRA) 100 MG tablet TAKE 1/2 TO 1 TABLET BY MOUTH 1 HOUR PRIOR AS NEEDED 04/05/14   Ria Bush, MD    Allergies Piroxicam  Family History  Problem Relation Age of Onset  . Hypertension Mother   . Colon polyps Mother   . Cancer Father 33       stomach cancer  . Hyperlipidemia Brother   . CAD Brother 37       MI (smoker)  . Hyperlipidemia Brother   . Sleep apnea Brother   . Pancreatic cancer Brother   . Hyperlipidemia Brother   . CAD Brother 61       MI (smoker)  . Hyperlipidemia Brother   . Multiple sclerosis Brother   . Colon cancer Neg Hx   . Esophageal cancer Neg Hx   . Rectal cancer Neg Hx     Social History Social History   Tobacco Use  . Smoking status: Never Smoker  . Smokeless tobacco: Never Used  Substance Use Topics  . Alcohol use: No  . Drug use: No  Review of Systems  Constitutional: No fever/chills. Eyes: No visual changes. ENT: No sore throat. Cardiovascular: Denies chest pain. Respiratory: Denies shortness of breath. Gastrointestinal: No abdominal pain.  No nausea, no vomiting.  No diarrhea.  No constipation. Genitourinary: Negative for dysuria. Musculoskeletal: Negative for back pain. Skin: Negative for rash. Neurological: Positive for head injury/scalp laceration.  Negative for headaches, focal weakness or numbness.   ____________________________________________   PHYSICAL EXAM:  VITAL SIGNS: ED Triage Vitals  Enc Vitals Group     BP 05/30/17 2242 (!) 183/100     Pulse Rate 05/30/17 2242 95     Resp 05/30/17 2242 17     Temp 05/30/17 2242 98.2 F (36.8 C)     Temp Source 05/30/17 2242 Oral      SpO2 05/30/17 2242 97 %     Weight 05/30/17 2243 200 lb (90.7 kg)     Height 05/30/17 2243 5\' 6"  (1.676 m)     Head Circumference --      Peak Flow --      Pain Score 05/30/17 2243 2     Pain Loc --      Pain Edu? --      Excl. in [place]? --     Constitutional: Alert and oriented. Well appearing and in no acute distress. Eyes: Conjunctivae are normal. PERRL. EOMI. Head: Approximately 4 cm linear laceration to right parietal scalp without active bleeding. Nose: No congestion/rhinnorhea. Mouth/Throat: Mucous membranes are moist.  Oropharynx non-erythematous. Neck: No stridor.  No cervical spine tenderness to palpation.  No step-offs or deformities. Cardiovascular: Normal rate, regular rhythm. Grossly normal heart sounds.  Good peripheral circulation. Respiratory: Normal respiratory effort.  No retractions. Lungs CTAB. Gastrointestinal: Soft and nontender. No distention. No abdominal bruits. No CVA tenderness. Musculoskeletal: No lower extremity tenderness nor edema.  No joint effusions. Neurologic:  Normal speech and language. No gross focal neurologic deficits are appreciated. No gait instability. Skin:  Skin is warm, dry and intact. No rash noted. Psychiatric: Mood and affect are normal. Speech and behavior are normal.  ____________________________________________   LABS (all labs ordered are listed, but only abnormal results are displayed)  Labs Reviewed - No data to display ____________________________________________  EKG  None ____________________________________________  RADIOLOGY  ED MD interpretation:  No ICH  Official radiology report(s): Ct Head Wo Contrast  Result Date: 05/30/2017 CLINICAL DATA:  Pain following fall EXAM: CT HEAD WITHOUT CONTRAST TECHNIQUE: Contiguous axial images were obtained from the base of the skull through the vertex without intravenous contrast. COMPARISON:  None. FINDINGS: Brain: The ventricles are normal in size and configuration. There is  no intracranial mass, hemorrhage, extra-axial fluid collection, or midline shift. Gray-white compartments appear unremarkable. No acute infarct is evident. Vascular: There is no hyperdense vessel. No vascular calcification evident. Skull: There is a right posterior frontal scalp hematoma. Bony calvarium appears intact. Sinuses/Orbits: Visualized paranasal sinuses are clear. Visualized orbits appear symmetric bilaterally. Other: Mastoid air cells are clear. There is mild debris in the left external auditory canal. IMPRESSION: Right frontal scalp hematoma. No fracture. Probable cerumen in the left external auditory canal. Study otherwise unremarkable. Electronically Signed   By: Lowella Grip III M.D.   On: 05/30/2017 23:40    ____________________________________________   PROCEDURES  Procedure(s) performed:     Marland KitchenMarland KitchenLaceration Repair Date/Time: 05/31/2017 6:33 AM Performed by: Paulette Blanch, MD Authorized by: Paulette Blanch, MD   Consent:    Consent obtained:  Verbal   Consent given by:  Patient  Risks discussed:  Pain, poor cosmetic result, poor wound healing and infection Anesthesia (see MAR for exact dosages):    Anesthesia method:  Topical application   Topical anesthesia: Pain Ease. Laceration details:    Location:  Scalp   Scalp location:  R parietal   Length (cm):  4 Repair type:    Repair type:  Simple Exploration:    Hemostasis achieved with:  Direct pressure   Wound exploration: entire depth of wound probed and visualized     Contaminated: no   Treatment:    Area cleansed with:  Saline   Amount of cleaning:  Standard   Irrigation solution:  Sterile saline   Visualized foreign bodies/material removed: no   Skin repair:    Repair method:  Staples   Number of staples:  4 Approximation:    Approximation:  Close Post-procedure details:    Dressing:  Sterile dressing   Patient tolerance of procedure:  Tolerated well, no immediate complications     Critical Care  performed: No  ____________________________________________   INITIAL IMPRESSION / ASSESSMENT AND PLAN / ED COURSE  As part of my medical decision making, I reviewed the following data within the [place] History obtained from family, Nursing notes reviewed and incorporated, Radiograph reviewed and Notes from prior ED visits   62 year old man who presents status post fall with scalp laceration.  No focal neurological deficits.  No cervical spinal tenderness to palpation.  Tolerated staple repair well.  Strict return precautions given. Patient and spouse verbalize understanding and agree with plan of care.      ____________________________________________   FINAL CLINICAL IMPRESSION(S) / ED DIAGNOSES  Final diagnoses:  Fall, initial encounter  Injury of head, initial encounter  Scalp laceration, initial encounter     ED Discharge Orders        Ordered    HYDROcodone-acetaminophen (NORCO) 5-325 MG tablet  Every 6 hours PRN     05/31/17 0627    ondansetron (ZOFRAN ODT) 4 MG disintegrating tablet  Every 8 hours PRN     05/31/17 2620       Note:  This document was prepared using Dragon voice recognition software and may include unintentional dictation errors.    Paulette Blanch, MD 05/31/17 [phone]

## 2017-06-07 ENCOUNTER — Encounter: Payer: Self-pay | Admitting: Family Medicine

## 2017-06-07 ENCOUNTER — Ambulatory Visit: Payer: 59 | Admitting: Family Medicine

## 2017-06-07 VITALS — BP 134/80 | HR 105 | Temp 98.3°F | Ht 65.75 in | Wt 210.0 lb

## 2017-06-07 DIAGNOSIS — Z23 Encounter for immunization: Secondary | ICD-10-CM

## 2017-06-07 DIAGNOSIS — S0101XA Laceration without foreign body of scalp, initial encounter: Secondary | ICD-10-CM | POA: Diagnosis not present

## 2017-06-07 NOTE — Progress Notes (Signed)
BP 134/80 (BP Location: Right Arm, Patient Position: Sitting, Cuff Size: Large)   Pulse (!) 105   Temp 98.3 F (36.8 C) (Oral)   Ht 5' 5.75" (1.67 m)   Wt 210 lb (95.3 kg)   SpO2 96%   BMI 34.15 kg/m    CC: ER f/u visit Subjective:    Patient ID: Ricky Barton, male    DOB: 05/14/1955, 62 y.o.   MRN: [id number]  HPI: Ricky Barton is a 62 y.o. male presenting on 06/07/2017 for ER Follow-up (Fall - staple removal (head))   Recent eval at ED after fall at home - lost balance when dog (lab) pulled him down 5 steps. Records reviewed, CT head without acute findings besides R frontal scalp hematoma. Also had abrasions to R leg and R shoulder bruise without residual trouble.   Seen at ER 05/31/2017.  Here for staple removal.  Due for Tdap.   States he was told he had a mild concussion as well. No residual headache or confusion or vision changes.   Relevant past medical, surgical, family and social history reviewed and updated as indicated. Interim medical history since our last visit reviewed. Allergies and medications reviewed and updated. Outpatient Medications Prior to Visit  Medication Sig Dispense Refill  . acetaminophen (TYLENOL) 500 MG tablet Take 500 mg by mouth every 6 (six) hours as needed.    Marland Kitchen aspirin 81 MG EC tablet Take 81 mg by mouth every Monday, Wednesday, and Friday. Swallow whole.    Marland Kitchen HYDROcodone-acetaminophen (NORCO) 5-325 MG tablet Take 1 tablet by mouth every 6 (six) hours as needed for moderate pain. (Patient not taking: Reported on 06/07/2017) 15 tablet 0  . ibuprofen (ADVIL,MOTRIN) 200 MG tablet Take 200 mg by mouth as needed.      . ondansetron (ZOFRAN ODT) 4 MG disintegrating tablet Take 1 tablet (4 mg total) by mouth every 8 (eight) hours as needed for nausea or vomiting. (Patient not taking: Reported on 06/07/2017) 15 tablet 0  . sildenafil (VIAGRA) 100 MG tablet TAKE 1/2 TO 1 TABLET BY MOUTH 1 HOUR PRIOR AS NEEDED (Patient not taking: Reported on  06/07/2017) 9 tablet 6   Facility-Administered Medications Prior to Visit  Medication Dose Route Frequency Provider Last Rate Last Dose  . 0.9 %  sodium chloride infusion  500 mL Intravenous Continuous Ladene Artist, MD         Per HPI unless specifically indicated in ROS section below Review of Systems     Objective:    BP 134/80 (BP Location: Right Arm, Patient Position: Sitting, Cuff Size: Large)   Pulse (!) 105   Temp 98.3 F (36.8 C) (Oral)   Ht 5' 5.75" (1.67 m)   Wt 210 lb (95.3 kg)   SpO2 96%   BMI 34.15 kg/m   Wt Readings from Last 3 Encounters:  06/07/17 210 lb (95.3 kg)  05/30/17 200 lb (90.7 kg)  10/06/16 200 lb (90.7 kg)    Physical Exam  Constitutional: He appears well-developed and well-nourished. No distress.  HENT:  Head: Normocephalic. Head is with laceration.    Mouth/Throat: Oropharynx is clear and moist. No oropharyngeal exudate.  ~2cm laceration R parietal scalp with 4 staples approximating wound, healed well. Small scalp hematoma anterior to laceration  Eyes: Pupils are equal, round, and reactive to light. Conjunctivae and EOM are normal. No scleral icterus.  Musculoskeletal: He exhibits no edema.  Psychiatric: He has a normal mood and affect.  Nursing note and  vitals reviewed.     Assessment & Plan:   Problem List Items Addressed This Visit    Scalp laceration - Primary    4 staples removed, pt tolerated well. Aftercare reviewed. Tdap updated.           No orders of the defined types were placed in this encounter.  No orders of the defined types were placed in this encounter.   Follow up plan: Return if symptoms worsen or fail to improve.  Ria Bush, MD

## 2017-06-07 NOTE — Patient Instructions (Addendum)
Tdap today.  Staples removed.  After showering, pat dry don't rub.  For headache - continue supportive care - may take tylenol as needed. If an activity causes headache or eye pain, back off this.

## 2017-06-07 NOTE — Addendum Note (Signed)
Addended by: Magdalen Spatz C on: 06/07/2017 04:49 PM   Modules accepted: Orders

## 2017-06-07 NOTE — Assessment & Plan Note (Signed)
4 staples removed, pt tolerated well. Aftercare reviewed. Tdap updated.

## 2017-10-01 DIAGNOSIS — S9031XA Contusion of right foot, initial encounter: Secondary | ICD-10-CM | POA: Diagnosis not present

## 2017-10-01 DIAGNOSIS — S93491A Sprain of other ligament of right ankle, initial encounter: Secondary | ICD-10-CM | POA: Diagnosis not present

## 2017-10-18 ENCOUNTER — Other Ambulatory Visit: Payer: Self-pay | Admitting: Family Medicine

## 2017-10-18 ENCOUNTER — Other Ambulatory Visit (INDEPENDENT_AMBULATORY_CARE_PROVIDER_SITE_OTHER): Payer: 59

## 2017-10-18 DIAGNOSIS — R739 Hyperglycemia, unspecified: Secondary | ICD-10-CM

## 2017-10-18 DIAGNOSIS — Z125 Encounter for screening for malignant neoplasm of prostate: Secondary | ICD-10-CM | POA: Diagnosis not present

## 2017-10-18 DIAGNOSIS — E785 Hyperlipidemia, unspecified: Secondary | ICD-10-CM

## 2017-10-18 DIAGNOSIS — S93491D Sprain of other ligament of right ankle, subsequent encounter: Secondary | ICD-10-CM | POA: Diagnosis not present

## 2017-10-18 LAB — COMPREHENSIVE METABOLIC PANEL
ALBUMIN: 4.1 g/dL (ref 3.5–5.2)
ALK PHOS: 67 U/L (ref 39–117)
ALT: 13 U/L (ref 0–53)
AST: 15 U/L (ref 0–37)
BILIRUBIN TOTAL: 0.5 mg/dL (ref 0.2–1.2)
BUN: 15 mg/dL (ref 6–23)
CHLORIDE: 104 meq/L (ref 96–112)
CO2: 29 mEq/L (ref 19–32)
Calcium: 9.5 mg/dL (ref 8.4–10.5)
Creatinine, Ser: 0.81 mg/dL (ref 0.40–1.50)
GFR: 102.54 mL/min (ref >60.00)
Glucose, Bld: 102 mg/dL — ABNORMAL HIGH (ref 70–99)
Potassium: 4.9 mEq/L (ref 3.5–5.1)
SODIUM: 140 meq/L (ref 135–145)
TOTAL PROTEIN: 7.1 g/dL (ref 6.0–8.3)

## 2017-10-18 LAB — LIPID PANEL
CHOLESTEROL: 170 mg/dL (ref 0–200)
HDL: 45.7 mg/dL (ref >39.00)
LDL Cholesterol: 101 mg/dL — ABNORMAL HIGH (ref 0–99)
NONHDL: 124.4
Total CHOL/HDL Ratio: 4
Triglycerides: 115 mg/dL (ref 0.0–149.0)
VLDL: 23 mg/dL (ref 0.0–40.0)

## 2017-10-18 LAB — PSA: PSA: 0.41 ng/mL (ref 0.10–4.00)

## 2017-10-19 ENCOUNTER — Ambulatory Visit (INDEPENDENT_AMBULATORY_CARE_PROVIDER_SITE_OTHER): Payer: 59 | Admitting: Family Medicine

## 2017-10-19 ENCOUNTER — Encounter: Payer: Self-pay | Admitting: Family Medicine

## 2017-10-19 VITALS — BP 124/82 | HR 99 | Temp 98.6°F | Ht 65.5 in | Wt 210.5 lb

## 2017-10-19 DIAGNOSIS — E785 Hyperlipidemia, unspecified: Secondary | ICD-10-CM

## 2017-10-19 DIAGNOSIS — E669 Obesity, unspecified: Secondary | ICD-10-CM | POA: Diagnosis not present

## 2017-10-19 DIAGNOSIS — Z0000 Encounter for general adult medical examination without abnormal findings: Secondary | ICD-10-CM

## 2017-10-19 DIAGNOSIS — N529 Male erectile dysfunction, unspecified: Secondary | ICD-10-CM

## 2017-10-19 NOTE — Assessment & Plan Note (Signed)
Preventative protocols reviewed and updated unless pt declined. Discussed healthy diet and lifestyle.  

## 2017-10-19 NOTE — Assessment & Plan Note (Addendum)
viagra effective. Declines refill today

## 2017-10-19 NOTE — Assessment & Plan Note (Addendum)
Encouraged continue healthy diet and lifestyle changes to affect sustainable weight loss. He has been less active with recent ankle strain but is planning to restart regularly walking dogs.

## 2017-10-19 NOTE — Progress Notes (Signed)
BP 124/82 (BP Location: Left Arm, Patient Position: Sitting, Cuff Size: Large)   Pulse 99   Temp 98.6 F (37 C) (Oral)   Ht 5' 5.5" (1.664 m)   Wt 210 lb 8 oz (95.5 kg)   SpO2 96%   BMI 34.50 kg/m    CC: CPE Subjective:    Patient ID: Ricky Barton, male    DOB: 01/29/1956, 62 y.o.   MRN: [id number]  HPI: Ricky Barton is a 62 y.o. male presenting on 10/19/2017 for Annual Exam   [place] off ladder 09/30/2017 - sprained ankle. Initially used boot. Now using ankle brace.   Retired 07/16/2017 - less stress noted and feeling well.   Preventative: COLONOSCOPY 03/2011 - 3 adenomatous polyps, rec rpt 5 yrs (Dr Fuller Plan). Colonoscopy 07/2015 - WNL, rpt 5 yrs Fuller Plan) Prostate cancer screening - discussed, always normal <1 PSA. Will do DRE QOyear. Lung cancer screening - not eligible. Flu shot yearly Tetanus - 2010  shingrix - discussed Advanced directive planning - does not have set up.  Seat belt use discussed Sunscreen use discussed. No changing moles on skin. 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 Interim medical history since our last visit reviewed. Allergies and medications reviewed and updated. Outpatient Medications Prior to Visit  Medication Sig Dispense Refill  . acetaminophen (TYLENOL) 500 MG tablet Take 500 mg by mouth every 6 (six) hours as needed.    Marland Kitchen aspirin 81 MG EC tablet Take 81 mg by mouth every Monday, Wednesday, and Friday. Swallow whole.    . ibuprofen (ADVIL,MOTRIN) 200 MG tablet Take 200 mg by mouth as needed.      . sildenafil (VIAGRA) 100 MG tablet TAKE 1/2 TO 1 TABLET BY MOUTH 1 HOUR PRIOR AS NEEDED 9 tablet 6  .  HYDROcodone-acetaminophen (NORCO) 5-325 MG tablet Take 1 tablet by mouth every 6 (six) hours as needed for moderate pain. (Patient not taking: Reported on 06/07/2017) 15 tablet 0  . ondansetron (ZOFRAN ODT) 4 MG disintegrating tablet Take 1 tablet (4 mg total) by mouth every 8 (eight) hours as needed for nausea or vomiting. (Patient not taking: Reported on 06/07/2017) 15 tablet 0   Facility-Administered Medications Prior to Visit  Medication Dose Route Frequency Provider Last Rate Last Dose  . 0.9 %  sodium chloride infusion  500 mL Intravenous Continuous Ladene Artist, MD         Per HPI unless specifically indicated in ROS section below Review of Systems  Constitutional: Negative for activity change, appetite change, chills, fatigue, fever and unexpected weight change.  HENT: Negative for hearing loss.   Eyes: Negative for visual disturbance.  Respiratory: Negative for cough, chest tightness, shortness of breath and wheezing.   Cardiovascular: Negative for chest pain, palpitations and leg swelling.  Gastrointestinal: Negative for abdominal distention, abdominal pain, blood in stool, constipation, diarrhea, nausea and vomiting.  Genitourinary: Negative for difficulty urinating and hematuria.  Musculoskeletal: Negative for arthralgias, myalgias and neck pain.  Skin: Negative for rash.  Neurological: Negative for dizziness, seizures, syncope and headaches.  Hematological: Negative for adenopathy. Does not bruise/bleed easily.  Psychiatric/Behavioral: Negative for dysphoric mood. The patient is not nervous/anxious.  Objective:    BP 124/82 (BP Location: Left Arm, Patient Position: Sitting, Cuff Size: Large)   Pulse 99   Temp 98.6 F (37 C) (Oral)   Ht 5' 5.5" (1.664 m)   Wt 210 lb 8 oz (95.5 kg)   SpO2 96%   BMI 34.50 kg/m   Wt Readings from Last 3 Encounters:  10/19/17 210 lb 8 oz (95.5 kg)  06/07/17 210 lb (95.3 kg)  05/30/17 200 lb (90.7 kg)    Physical Exam    Constitutional: He is oriented to person, place, and time. He appears well-developed and well-nourished. No distress.  HENT:  Head: Normocephalic and atraumatic.  Right Ear: Hearing, tympanic membrane, external ear and ear canal normal.  Left Ear: Hearing, tympanic membrane, external ear and ear canal normal.  Nose: Nose normal.  Mouth/Throat: Uvula is midline, oropharynx is clear and moist and mucous membranes are normal. No oropharyngeal exudate, posterior oropharyngeal edema or posterior oropharyngeal erythema.  Eyes: Pupils are equal, round, and reactive to light. Conjunctivae and EOM are normal. No scleral icterus.  Neck: Normal range of motion. Neck supple. Carotid bruit is not present. No thyromegaly present.  Cardiovascular: Normal rate, regular rhythm, normal heart sounds and intact distal pulses.  No murmur heard. Pulses:      Radial pulses are 2+ on the right side, and 2+ on the left side.  Pulmonary/Chest: Effort normal and breath sounds normal. No respiratory distress. He has no wheezes. He has no rales.  Abdominal: Soft. Bowel sounds are normal. He exhibits no distension and no mass. There is no tenderness. There is no rebound and no guarding.  Genitourinary: Rectum normal and prostate normal. Rectal exam shows no external hemorrhoid, no internal hemorrhoid, no fissure, no mass, no tenderness and anal tone normal. Prostate is not enlarged (20gm) and not tender.  Musculoskeletal: Normal range of motion. He exhibits no edema.  Lymphadenopathy:    He has no cervical adenopathy.  Neurological: He is alert and oriented to person, place, and time.  CN grossly intact, station and gait intact  Skin: Skin is warm and dry. No rash noted.  Psychiatric: He has a normal mood and affect. His behavior is normal. Judgment and thought content normal.  Nursing note and vitals reviewed.  Results for orders placed or performed in visit on 10/18/17  PSA  Result Value Ref Range   PSA 0.41 0.10  - 4.00 ng/mL  Comprehensive metabolic panel  Result Value Ref Range   Sodium 140 135 - 145 mEq/L   Potassium 4.9 3.5 - 5.1 mEq/L   Chloride 104 96 - 112 mEq/L   CO2 29 19 - 32 mEq/L   Glucose, Bld 102 (H) 70 - 99 mg/dL   BUN 15 6 - 23 mg/dL   Creatinine, Ser 0.81 0.40 - 1.50 mg/dL   Total Bilirubin 0.5 0.2 - 1.2 mg/dL   Alkaline Phosphatase 67 39 - 117 U/L   AST 15 0 - 37 U/L   ALT 13 0 - 53 U/L   Total Protein 7.1 6.0 - 8.3 g/dL   Albumin 4.1 3.5 - 5.2 g/dL   Calcium 9.5 8.4 - 10.5 mg/dL   GFR 102.54 >60.00 mL/min  Lipid panel  Result Value Ref Range   Cholesterol 170 0 - 200 mg/dL   Triglycerides 115.0 0.0 - 149.0 mg/dL   HDL 45.70 >39.00 mg/dL   VLDL 23.0 0.0 - 40.0 mg/dL   LDL Cholesterol 101 (H) 0 - 99 mg/dL   Total CHOL/HDL  Ratio 4    NonHDL 124.40       Assessment & Plan:   Problem List Items Addressed This Visit    Obesity, Class I, BMI 30-34.9    Encouraged continue healthy diet and lifestyle changes to affect sustainable weight loss. He has been less active with recent ankle strain but is planning to restart regularly walking dogs.       HLD (hyperlipidemia)    Chronic, stable off meds. Continue to monitor. The 10-year ASCVD risk score [name] DC Brooke Bonito., et al., 2013) is: 8.9%   Values used to calculate the score:     Age: 57 years     Sex: Male     Is Non-Hispanic African American: No     Diabetic: No     Tobacco smoker: No     Systolic Blood Pressure: 425 mmHg     Is BP treated: No     HDL Cholesterol: 45.7 mg/dL     Total Cholesterol: 170 mg/dL       Healthcare maintenance - Primary    Preventative protocols reviewed and updated unless pt declined. Discussed healthy diet and lifestyle.       ERECTILE DYSFUNCTION, ORGANIC    viagra effective. Declines refill today          No orders of the defined types were placed in this encounter.  No orders of the defined types were placed in this encounter.   Follow up plan: Return in about 1 year  (around 10/20/2018) for annual exam, prior fasting for blood work.  Ria Bush, MD

## 2017-10-19 NOTE — Patient Instructions (Addendum)
If interested, check with pharmacy about new 2 shot shingles series (shingrix).  Advanced directive packet provided today. You are doing well. Continue working on [employer] and regular exercise.  Return as needed or in 1 year for next physical.  Health Maintenance, Male A healthy lifestyle and preventive care is important for your health and wellness. Ask your health care provider about what schedule of regular examinations is right for you. What should I know about weight and diet? Eat a Healthy Diet  Eat plenty of vegetables, fruits, whole grains, low-fat dairy products, and lean protein.  Do not eat a lot of foods high in solid fats, added sugars, or salt.  Maintain a Healthy Weight Regular exercise can help you achieve or maintain a healthy weight. You should:  Do at least 150 minutes of exercise each week. The exercise should increase your heart rate and make you sweat (moderate-intensity exercise).  Do strength-training exercises at least twice a week.  Watch Your Levels of Cholesterol and Blood Lipids  Have your blood tested for lipids and cholesterol every 5 years starting at 62 years of age. If you are at high risk for heart disease, you should start having your blood tested when you are 62 years old. You may need to have your cholesterol levels checked more often if: ? Your lipid or cholesterol levels are high. ? You are older than 62 years of age. ? You are at high risk for heart disease.  What should I know about cancer screening? Many types of cancers can be detected early and may often be prevented. Lung Cancer  You should be screened every year for lung cancer if: ? You are a current smoker who has smoked for at least 30 years. ? You are a former smoker who has quit within the past 15 years.  Talk to your health care provider about your screening options, when you should start screening, and how often you should be screened.  Colorectal Cancer  Routine  colorectal cancer screening usually begins at 62 years of age and should be repeated every 5-10 years until you are 62 years old. You may need to be screened more often if early forms of precancerous polyps or small growths are found. Your health care provider may recommend screening at an earlier age if you have risk factors for colon cancer.  Your health care provider may recommend using home test kits to check for hidden blood in the stool.  A small camera at the end of a tube can be used to examine your colon (sigmoidoscopy or colonoscopy). This checks for the earliest forms of colorectal cancer.  Prostate and Testicular Cancer  Depending on your age and overall health, your health care provider may do certain tests to screen for prostate and testicular cancer.  Talk to your health care provider about any symptoms or concerns you have about testicular or prostate cancer.  Skin Cancer  Check your skin from head to toe regularly.  Tell your health care provider about any new moles or changes in moles, especially if: ? There is a change in a mole's size, shape, or color. ? You have a mole that is larger than a pencil eraser.  Always use sunscreen. Apply sunscreen liberally and repeat throughout the day.  Protect yourself by wearing long sleeves, pants, a wide-brimmed hat, and sunglasses when outside.  What should I know about heart disease, diabetes, and high blood pressure?  If you are 52-35 years of age, have  your blood pressure checked every 3-5 years. If you are 20 years of age or older, have your blood pressure checked every year. You should have your blood pressure measured twice-once when you are at a hospital or clinic, and once when you are not at a hospital or clinic. Record the average of the two measurements. To check your blood pressure when you are not at a hospital or clinic, you can use: ? An automated blood pressure machine at a pharmacy. ? A home blood pressure  monitor.  Talk to your health care provider about your target blood pressure.  If you are between 4-39 years old, ask your health care provider if you should take aspirin to prevent heart disease.  Have regular diabetes screenings by checking your fasting blood sugar level. ? If you are at a normal weight and have a low risk for diabetes, have this test once every three years after the age of 63. ? If you are overweight and have a high risk for diabetes, consider being tested at a younger age or more often.  A one-time screening for abdominal aortic aneurysm (AAA) by ultrasound is recommended for men aged 54-75 years who are current or former smokers. What should I know about preventing infection? Hepatitis B If you have a higher risk for hepatitis B, you should be screened for this virus. Talk with your health care provider to find out if you are at risk for hepatitis B infection. Hepatitis C Blood testing is recommended for:  Everyone born from [birth year] through 1965.  Anyone with known risk factors for hepatitis C.  Sexually Transmitted Diseases (STDs)  You should be screened each year for STDs including gonorrhea and chlamydia if: ? You are sexually active and are younger than 62 years of age. ? You are older than 62 years of age and your health care provider tells you that you are at risk for this type of infection. ? Your sexual activity has changed since you were last screened and you are at an increased risk for chlamydia or gonorrhea. Ask your health care provider if you are at risk.  Talk with your health care provider about whether you are at high risk of being infected with HIV. Your health care provider may recommend a prescription medicine to help prevent HIV infection.  What else can I do?  Schedule regular health, dental, and eye exams.  Stay current with your vaccines (immunizations).  Do not use any tobacco products, such as cigarettes, chewing tobacco, and  e-cigarettes. If you need help quitting, ask your health care provider.  Limit alcohol intake to no more than 2 drinks per day. One drink equals 12 ounces of beer, 5 ounces of wine, or 1 ounces of hard liquor.  Do not use street drugs.  Do not share needles.  Ask your health care provider for help if you need support or information about quitting drugs.  Tell your health care provider if you often feel depressed.  Tell your health care provider if you have ever been abused or do not feel safe at home. This information is not intended to replace advice given to you by your health care provider. Make sure you discuss any questions you have with your health care provider. Document Released: 08/29/2007 Document Revised: 10/30/2015 Document Reviewed: 12/04/2014 Elsevier Interactive Patient Education  [employer].

## 2017-10-19 NOTE — Assessment & Plan Note (Signed)
Chronic, stable off meds. Continue to monitor. The 10-year ASCVD risk score [name] DC Brooke Bonito., et al., 2013) is: 8.9%   Values used to calculate the score:     Age: 62 years     Sex: Male     Is Non-Hispanic African American: No     Diabetic: No     Tobacco smoker: No     Systolic Blood Pressure: 017 mmHg     Is BP treated: No     HDL Cholesterol: 45.7 mg/dL     Total Cholesterol: 170 mg/dL

## 2018-01-13 DIAGNOSIS — H2513 Age-related nuclear cataract, bilateral: Secondary | ICD-10-CM | POA: Diagnosis not present

## 2018-03-28 ENCOUNTER — Telehealth: Payer: Self-pay | Admitting: Family Medicine

## 2018-03-28 ENCOUNTER — Ambulatory Visit: Payer: BC Managed Care – PPO | Admitting: Family Medicine

## 2018-03-28 ENCOUNTER — Encounter: Payer: Self-pay | Admitting: Family Medicine

## 2018-03-28 ENCOUNTER — Telehealth: Payer: Self-pay

## 2018-03-28 ENCOUNTER — Ambulatory Visit
Admission: RE | Admit: 2018-03-28 | Discharge: 2018-03-28 | Disposition: A | Discharge status: Home / Self Care | Payer: BC Managed Care – PPO | Source: Ambulatory Visit | Attending: Family Medicine | Admitting: Family Medicine

## 2018-03-28 VITALS — BP 122/84 | HR 100 | Temp 98.4°F | Ht 65.5 in | Wt 217.0 lb

## 2018-03-28 DIAGNOSIS — N289 Disorder of kidney and ureter, unspecified: Secondary | ICD-10-CM | POA: Insufficient documentation

## 2018-03-28 DIAGNOSIS — R1031 Right lower quadrant pain: Secondary | ICD-10-CM | POA: Insufficient documentation

## 2018-03-28 LAB — POC URINALSYSI DIPSTICK (AUTOMATED)
Bilirubin, UA: NEGATIVE
Glucose, UA: NEGATIVE
KETONES UA: NEGATIVE
Leukocytes, UA: NEGATIVE
Nitrite, UA: NEGATIVE
Protein, UA: NEGATIVE
RBC UA: NEGATIVE
SPEC GRAV UA: 1.025 (ref 1.010–1.025)
Urobilinogen, UA: 0.2 E.U./dL
pH, UA: 5.5 (ref 5.0–8.0)

## 2018-03-28 LAB — CBC WITH DIFFERENTIAL/PLATELET
Basophils Absolute: 0.1 10*3/uL (ref 0.0–0.1)
Basophils Relative: 0.5 % (ref 0.0–3.0)
Eosinophils Absolute: 0.2 10*3/uL (ref 0.0–0.7)
Eosinophils Relative: 1.7 % (ref 0.0–5.0)
HCT: 44.7 % (ref 39.0–52.0)
Hemoglobin: 15.1 g/dL (ref 13.0–17.0)
Lymphocytes Relative: 31.2 % (ref 12.0–46.0)
Lymphs Abs: 3 10*3/uL (ref 0.7–4.0)
MCHC: 33.7 g/dL (ref 30.0–36.0)
MCV: 92.3 fl (ref 78.0–100.0)
Monocytes Absolute: 0.7 10*3/uL (ref 0.1–1.0)
Monocytes Relative: 7.8 % (ref 3.0–12.0)
NEUTROS PCT: 58.8 % (ref 43.0–77.0)
Neutro Abs: 5.7 10*3/uL (ref 1.4–7.7)
Platelets: 344 10*3/uL (ref 150.0–400.0)
RBC: 4.84 Mil/uL (ref 4.22–5.81)
RDW: 13.3 % (ref 11.5–15.5)
WBC: 9.6 10*3/uL (ref 4.0–10.5)

## 2018-03-28 LAB — COMPREHENSIVE METABOLIC PANEL
ALK PHOS: 71 U/L (ref 39–117)
ALT: 23 U/L (ref 0–53)
AST: 23 U/L (ref 0–37)
Albumin: 4.2 g/dL (ref 3.5–5.2)
BUN: 14 mg/dL (ref 6–23)
CO2: 29 meq/L (ref 19–32)
Calcium: 9.3 mg/dL (ref 8.4–10.5)
Chloride: 102 mEq/L (ref 96–112)
Creatinine, Ser: 0.87 mg/dL (ref 0.40–1.50)
GFR: 94.5 mL/min (ref >60.00)
GLUCOSE: 97 mg/dL (ref 70–99)
POTASSIUM: 4.3 meq/L (ref 3.5–5.1)
SODIUM: 138 meq/L (ref 135–145)
TOTAL PROTEIN: 7.7 g/dL (ref 6.0–8.3)
Total Bilirubin: 0.5 mg/dL (ref 0.2–1.2)

## 2018-03-28 LAB — LIPASE: Lipase: 36 U/L (ref 11.0–59.0)

## 2018-03-28 MED ORDER — IOPAMIDOL (ISOVUE-300) INJECTION 61%
125.0000 mL | Freq: Once | INTRAVENOUS | Status: AC | PRN
  Administered 2018-03-28: 125 mL via INTRAVENOUS

## 2018-03-28 NOTE — Assessment & Plan Note (Addendum)
Describes concerning symptoms of RLQ pain worse with jarring, nausea/vomiting, decreased appetite with mild rebound and guarding on exam. Will check stat labs today and order CT abd/pelvis STAT r/o appendicitis. Ddx includes colitis, MSK, kidney stone. Pt agrees with plan.

## 2018-03-28 NOTE — Telephone Encounter (Signed)
Pt's wife stopped by the office. She states pt cannot remember all of his instructions. She's requesting a phone call to discuss.

## 2018-03-28 NOTE — Telephone Encounter (Signed)
Spoke with pt to go over instructions from Dr. Darnell Level in today's OV.  Pt states Dr. Darnell Level called him with CT results and instructed him to eat a bland diet.  Says he remembers broth on the list but cannot remember any of the other items suggested. Plz advise.

## 2018-03-28 NOTE — Telephone Encounter (Signed)
Amber with [facility] called report [place] abd and pelvis which is in Epic and I am taking a copy to Dr Darnell Level.

## 2018-03-28 NOTE — Telephone Encounter (Signed)
Spoke with patient. See results note.  

## 2018-03-28 NOTE — Telephone Encounter (Signed)
Spoke with patient.

## 2018-03-28 NOTE — Progress Notes (Signed)
BP 122/84 (BP Location: Left Arm, Patient Position: Sitting, Cuff Size: Normal)   Pulse 100   Temp 98.4 F (36.9 C) (Oral)   Ht 5' 5.5" (1.664 m)   Wt 217 lb (98.4 kg)   SpO2 94%   BMI 35.56 kg/m    CC: RLQ abd pain Subjective:    Patient ID: Ricky Barton, male    DOB: 1955-04-21, 63 y.o.   MRN: [id number]  HPI: Ricky Barton is a 63 y.o. male presenting on 03/28/2018 for Abdominal Pain (C/o constant RLQ abd pain. Has had some vomiting, no diarrhea. Started 03/26/17. )   3d h/o abd discomfort/ GI upset that started on trip to [place]. Started with bloating Thursday night, then Friday night he felt nauseated, no appetite, dry heaves, R sided headache. Saturday vomited x2-3. Able to keep chicken noodle soup down Saturday evening. Nausea improved yesterday. Yesterday to today noticing increasing RLQ abd pain. Currently with RLQ pain described as 8/10, worse with jarring motions, better if he lays still. Ate coffee and pasta salad this morning.   H/o cholecystectomy. Appendix remains in place.  Last BM this morning - normal.  No UTI sxs. No fevers. No blood in urine. Denies inciting trauma/injury.      Relevant past medical, surgical, family and social history reviewed and updated as indicated. Interim medical history since our last visit reviewed. Allergies and medications reviewed and updated. Outpatient Medications Prior to Visit  Medication Sig Dispense Refill  . acetaminophen (TYLENOL) 500 MG tablet Take 500 mg by mouth every 6 (six) hours as needed.    Marland Kitchen aspirin 81 MG EC tablet Take 81 mg by mouth every Monday, Wednesday, and Friday. Swallow whole.    . ibuprofen (ADVIL,MOTRIN) 200 MG tablet Take 200 mg by mouth as needed.      . sildenafil (VIAGRA) 100 MG tablet TAKE 1/2 TO 1 TABLET BY MOUTH 1 HOUR PRIOR AS NEEDED 9 tablet 6   Facility-Administered Medications Prior to Visit  Medication Dose Route Frequency Provider Last Rate Last Dose  . 0.9 %  sodium chloride infusion   500 mL Intravenous Continuous Ladene Artist, MD         Per HPI unless specifically indicated in ROS section below Review of Systems Objective:    BP 122/84 (BP Location: Left Arm, Patient Position: Sitting, Cuff Size: Normal)   Pulse 100   Temp 98.4 F (36.9 C) (Oral)   Ht 5' 5.5" (1.664 m)   Wt 217 lb (98.4 kg)   SpO2 94%   BMI 35.56 kg/m   Wt Readings from Last 3 Encounters:  03/28/18 217 lb (98.4 kg)  10/19/17 210 lb 8 oz (95.5 kg)  06/07/17 210 lb (95.3 kg)    Physical Exam Vitals signs and nursing note reviewed.  Constitutional:      Appearance: He is well-developed.     Comments: Uncomfortable appearing  HENT:     Mouth/Throat:     Mouth: Mucous membranes are moist.     Pharynx: Oropharynx is clear.  Cardiovascular:     Rate and Rhythm: Normal rate and regular rhythm.     Pulses: Normal pulses.     Heart sounds: Normal heart sounds. No murmur.  Pulmonary:     Effort: Pulmonary effort is normal. No respiratory distress.     Breath sounds: Normal breath sounds. No wheezing, rhonchi or rales.  Abdominal:     General: Abdomen is flat. Bowel sounds are normal. There is no distension.  Palpations: Abdomen is soft. There is no mass.     Tenderness: There is abdominal tenderness (moderate) in the right lower quadrant and suprapubic area. There is guarding (mild) and rebound (to RLQ and suprapubically). There is no right CVA tenderness or left CVA tenderness.     Hernia: A hernia (fullness noted R inguinal region) is present.  Musculoskeletal:     Right lower leg: No edema.     Left lower leg: No edema.  Skin:    Findings: No rash.  Neurological:     Mental Status: He is alert.  Psychiatric:        Mood and Affect: Mood normal.       Results for orders placed or performed in visit on 03/28/18  POCT Urinalysis Dipstick (Automated)  Result Value Ref Range   Color, UA yellow    Clarity, UA clear    Glucose, UA Negative Negative   Bilirubin, UA negative     Ketones, UA negative    Spec Grav, UA 1.025 1.010 - 1.025   Blood, UA negative    pH, UA 5.5 5.0 - 8.0   Protein, UA Negative Negative   Urobilinogen, UA 0.2 0.2 or 1.0 E.U./dL   Nitrite, UA negative    Leukocytes, UA Negative Negative   Assessment & Plan:   Problem List Items Addressed This Visit    Right lower quadrant abdominal pain - Primary    Describes concerning symptoms of RLQ pain worse with jarring, nausea/vomiting, decreased appetite with mild rebound and guarding on exam. Will check stat labs today and order CT abd/pelvis STAT r/o appendicitis. Ddx includes colitis, MSK, kidney stone. Pt agrees with plan.       Relevant Orders   CT Abdomen Pelvis W Contrast   Comprehensive metabolic panel   CBC with Differential/Platelet   Lipase   POCT Urinalysis Dipstick (Automated) (Completed)       No orders of the defined types were placed in this encounter.  Orders Placed This Encounter  Procedures  . CT Abdomen Pelvis W Contrast    Standing Status:   Future    Standing Expiration Date:   06/27/2019    Order Specific Question:   ** REASON FOR EXAM (FREE TEXT)    Answer:   RLQ abd pain, nausea/vomiting,    Order Specific Question:   If indicated for the ordered procedure, I authorize the administration of contrast media per Radiology protocol    Answer:   Yes    Order Specific Question:   Preferred imaging location?    Answer:   [facility]    Order Specific Question:   Is Oral Contrast requested for this exam?    Answer:   Yes, Per Radiology protocol    Order Specific Question:   Call Results- Best Contact Number?    Answer:   Call results, [phone]    Order Specific Question:   Radiology Contrast Protocol - do NOT remove file path    Answer:   [email or web address]  . Comprehensive metabolic panel  . CBC with Differential/Platelet  . Lipase  . POCT Urinalysis Dipstick (Automated)    Follow up plan: No follow-ups on  file.  Ria Bush, MD

## 2018-11-07 ENCOUNTER — Other Ambulatory Visit: Payer: Self-pay | Admitting: Family Medicine

## 2018-11-07 DIAGNOSIS — R739 Hyperglycemia, unspecified: Secondary | ICD-10-CM

## 2018-11-07 DIAGNOSIS — Z125 Encounter for screening for malignant neoplasm of prostate: Secondary | ICD-10-CM

## 2018-11-07 DIAGNOSIS — E785 Hyperlipidemia, unspecified: Secondary | ICD-10-CM

## 2018-11-08 ENCOUNTER — Other Ambulatory Visit: Payer: Self-pay

## 2018-11-08 ENCOUNTER — Other Ambulatory Visit (INDEPENDENT_AMBULATORY_CARE_PROVIDER_SITE_OTHER): Payer: BC Managed Care – PPO

## 2018-11-08 DIAGNOSIS — Z125 Encounter for screening for malignant neoplasm of prostate: Secondary | ICD-10-CM | POA: Diagnosis not present

## 2018-11-08 DIAGNOSIS — E785 Hyperlipidemia, unspecified: Secondary | ICD-10-CM

## 2018-11-08 LAB — LIPID PANEL
Cholesterol: 187 mg/dL (ref 0–200)
HDL: 42.8 mg/dL (ref >39.00)
LDL Cholesterol: 108 mg/dL — ABNORMAL HIGH (ref 0–99)
NonHDL: 144.31
Total CHOL/HDL Ratio: 4
Triglycerides: 183 mg/dL — ABNORMAL HIGH (ref 0.0–149.0)
VLDL: 36.6 mg/dL (ref 0.0–40.0)

## 2018-11-08 LAB — COMPREHENSIVE METABOLIC PANEL
ALT: 18 U/L (ref 0–53)
AST: 19 U/L (ref 0–37)
Albumin: 4.2 g/dL (ref 3.5–5.2)
Alkaline Phosphatase: 68 U/L (ref 39–117)
BUN: 14 mg/dL (ref 6–23)
CO2: 27 mEq/L (ref 19–32)
Calcium: 9.2 mg/dL (ref 8.4–10.5)
Chloride: 104 mEq/L (ref 96–112)
Creatinine, Ser: 0.78 mg/dL (ref 0.40–1.50)
GFR: 100.43 mL/min (ref >60.00)
Glucose, Bld: 106 mg/dL — ABNORMAL HIGH (ref 70–99)
Potassium: 4.3 mEq/L (ref 3.5–5.1)
Sodium: 140 mEq/L (ref 135–145)
Total Bilirubin: 0.5 mg/dL (ref 0.2–1.2)
Total Protein: 7 g/dL (ref 6.0–8.3)

## 2018-11-08 LAB — PSA: PSA: 0.37 ng/mL (ref 0.10–4.00)

## 2018-11-10 ENCOUNTER — Ambulatory Visit (INDEPENDENT_AMBULATORY_CARE_PROVIDER_SITE_OTHER): Payer: BC Managed Care – PPO | Admitting: Family Medicine

## 2018-11-10 ENCOUNTER — Other Ambulatory Visit: Payer: Self-pay

## 2018-11-10 ENCOUNTER — Encounter: Payer: Self-pay | Admitting: Family Medicine

## 2018-11-10 VITALS — BP 134/88 | HR 103 | Temp 98.7°F | Wt 217.3 lb

## 2018-11-10 DIAGNOSIS — R739 Hyperglycemia, unspecified: Secondary | ICD-10-CM | POA: Diagnosis not present

## 2018-11-10 DIAGNOSIS — M25562 Pain in left knee: Secondary | ICD-10-CM

## 2018-11-10 DIAGNOSIS — E785 Hyperlipidemia, unspecified: Secondary | ICD-10-CM

## 2018-11-10 DIAGNOSIS — Z23 Encounter for immunization: Secondary | ICD-10-CM | POA: Diagnosis not present

## 2018-11-10 DIAGNOSIS — R030 Elevated blood-pressure reading, without diagnosis of hypertension: Secondary | ICD-10-CM | POA: Insufficient documentation

## 2018-11-10 DIAGNOSIS — R0683 Snoring: Secondary | ICD-10-CM

## 2018-11-10 DIAGNOSIS — Z0000 Encounter for general adult medical examination without abnormal findings: Secondary | ICD-10-CM

## 2018-11-10 MED ORDER — FISH OIL 1000 MG PO CAPS: 1.0000 | ORAL_CAPSULE | Freq: Every day | ORAL | 0 refills | Status: DC

## 2018-11-10 NOTE — Progress Notes (Signed)
This visit was conducted in person.  BP 134/88 (BP Location: Right Arm, Patient Position: Sitting, Cuff Size: Large)   Pulse (!) 103   Temp 98.7 F (37.1 C) (Temporal)   Wt 217 lb 5 oz (98.6 kg)   SpO2 96%   BMI 35.61 kg/m   BP Readings from Last 3 Encounters:  11/10/18 134/88  03/28/18 122/84  10/19/17 124/82   on repeat, BP 140/90  CC: CPE Subjective:    Patient ID: Ricky Barton, male    DOB: 1955/03/25, 63 y.o.   MRN: [id number]  HPI: Ricky Barton is a 63 y.o. male presenting on 11/10/2018 for Annual Exam   On aspirin due to fmhx MI in brothers  Ongoing L knee pain since twisting injury at work 15 yrs ago, recently worsening.   Wife thinks he may have sleep apnea - wife says he snores. No witnessed apneic episode, no PNdyspnea. Endorses non restorative sleep. +daytime somnolence.   Preventative: COLONOSCOPY 03/2011 - 3 adenomatous polyps, rec rpt 5 yrs (Dr Fuller Plan)  Colonoscopy5/2017-WNL, rpt 5 yrs Fuller Plan)  Prostate cancer screening - continue PSA yearly, consider DRE if sxs or changing Lung cancer screening - not eligible  Flu shotyearly Td 2010, Tdap 05/2017 Shingrix - completed 11/2017, 02/2018 Advanced directive planning - does not have set up.  Seat belt use discussed Sunscreen use discussed. No changing moles on skin. Non smoker Alcohol - none Dentist - Q68mo  Eye exam - yearly   Caffeine: occasional coffee/tea  Married and lives with wife, grown children, dogs Occupation: [employer]  Activity: golfing, walks dogs at least 1 mile, joined [employer]  Diet: good water, fruits/vegetables daily     Relevant past medical, surgical, family and social history reviewed and updated as indicated. Interim medical history since our last visit reviewed. Allergies and medications reviewed and updated. Outpatient Medications Prior to Visit  Medication Sig Dispense Refill  . acetaminophen (TYLENOL) 500 MG tablet Take 500 mg by mouth every 6 (six) hours as  needed.    Marland Kitchen aspirin 81 MG EC tablet Take 81 mg by mouth every Monday, Wednesday, and Friday. Swallow whole.    . ibuprofen (ADVIL,MOTRIN) 200 MG tablet Take 200 mg by mouth as needed.      . sildenafil (VIAGRA) 100 MG tablet TAKE 1/2 TO 1 TABLET BY MOUTH 1 HOUR PRIOR AS NEEDED 9 tablet 6   No facility-administered medications prior to visit.      Per HPI unless specifically indicated in ROS section below Review of Systems  Constitutional: Negative for activity change, appetite change, chills, fatigue, fever and unexpected weight change.  HENT: Negative for hearing loss.   Eyes: Negative for visual disturbance.  Respiratory: Negative for cough, chest tightness, shortness of breath and wheezing.   Cardiovascular: Negative for chest pain, palpitations and leg swelling.  Gastrointestinal: Negative for abdominal distention, abdominal pain, blood in stool, constipation, diarrhea, nausea and vomiting.  Genitourinary: Negative for difficulty urinating and hematuria.  Musculoskeletal: Negative for arthralgias, myalgias and neck pain.  Skin: Negative for rash.  Neurological: Negative for dizziness, seizures, syncope and headaches.  Hematological: Negative for adenopathy. Does not bruise/bleed easily.  Psychiatric/Behavioral: Negative for dysphoric mood. The patient is not nervous/anxious.    Objective:    BP 134/88 (BP Location: Right Arm, Patient Position: Sitting, Cuff Size: Large)   Pulse (!) 103   Temp 98.7 F (37.1 C) (Temporal)   Wt 217 lb 5 oz (98.6 kg)   SpO2 96%  BMI 35.61 kg/m   Wt Readings from Last 3 Encounters:  11/10/18 217 lb 5 oz (98.6 kg)  03/28/18 217 lb (98.4 kg)  10/19/17 210 lb 8 oz (95.5 kg)    Physical Exam Vitals signs and nursing note reviewed.  Constitutional:      General: He is not in acute distress.    Appearance: Normal appearance. He is well-developed. He is not ill-appearing.  HENT:     Head: Normocephalic and atraumatic.     Right Ear: Hearing,  tympanic membrane, ear canal and external ear normal.     Left Ear: Hearing, tympanic membrane, ear canal and external ear normal.     Nose: Nose normal.     Mouth/Throat:     Mouth: Mucous membranes are moist.     Pharynx: Oropharynx is clear. Uvula midline. No oropharyngeal exudate or posterior oropharyngeal erythema.  Eyes:     General: No scleral icterus.    Conjunctiva/sclera: Conjunctivae normal.     Pupils: Pupils are equal, round, and reactive to light.  Neck:     Musculoskeletal: Normal range of motion and neck supple.  Cardiovascular:     Rate and Rhythm: Normal rate and regular rhythm.     Pulses: Normal pulses.          Radial pulses are 2+ on the right side and 2+ on the left side.     Heart sounds: Normal heart sounds. No murmur.  Pulmonary:     Effort: Pulmonary effort is normal. No respiratory distress.     Breath sounds: Normal breath sounds. No wheezing, rhonchi or rales.  Abdominal:     General: Abdomen is flat. Bowel sounds are normal. There is no distension.     Palpations: Abdomen is soft. There is no mass.     Tenderness: There is no abdominal tenderness. There is no guarding or rebound.     Hernia: No hernia is present.  Musculoskeletal: Normal range of motion.     Comments:  R knee WNL L knee exam: No deformity on inspection. Discomfort to palpation of medial joint line No effusion/swelling noted. FROM in flex/extension with crepitus. No popliteal fullness. No significant pain with mcmurray test. No abnormal patellar mobility.   Lymphadenopathy:     Cervical: No cervical adenopathy.  Skin:    General: Skin is warm and dry.     Findings: No rash.  Neurological:     General: No focal deficit present.     Mental Status: He is alert and oriented to person, place, and time.     Comments: CN grossly intact, station and gait intact  Psychiatric:        Mood and Affect: Mood normal.        Behavior: Behavior normal.        Thought Content: Thought  content normal.        Judgment: Judgment normal.       Results for orders placed or performed in visit on 11/08/18  PSA  Result Value Ref Range   PSA 0.37 0.10 - 4.00 ng/mL  Comprehensive metabolic panel  Result Value Ref Range   Sodium 140 135 - 145 mEq/L   Potassium 4.3 3.5 - 5.1 mEq/L   Chloride 104 96 - 112 mEq/L   CO2 27 19 - 32 mEq/L   Glucose, Bld 106 (H) 70 - 99 mg/dL   BUN 14 6 - 23 mg/dL   Creatinine, Ser 0.78 0.40 - 1.50 mg/dL   Total Bilirubin 0.5  0.2 - 1.2 mg/dL   Alkaline Phosphatase 68 39 - 117 U/L   AST 19 0 - 37 U/L   ALT 18 0 - 53 U/L   Total Protein 7.0 6.0 - 8.3 g/dL   Albumin 4.2 3.5 - 5.2 g/dL   Calcium 9.2 8.4 - 10.5 mg/dL   GFR 100.43 >60.00 mL/min  Lipid panel  Result Value Ref Range   Cholesterol 187 0 - 200 mg/dL   Triglycerides 183.0 (H) 0.0 - 149.0 mg/dL   HDL 42.80 >39.00 mg/dL   VLDL 36.6 0.0 - 40.0 mg/dL   LDL Cholesterol 108 (H) 0 - 99 mg/dL   Total CHOL/HDL Ratio 4    NonHDL 144.31    Assessment & Plan:   Problem List Items Addressed This Visit    Snoring    Ongoing. Bothersome to wife. Also endorses non restorative sleep and daytime somnolence. ESS = 8. Reasonable to refer for further evaluation.       Relevant Orders   Ambulatory referral to Pulmonology   Severe obesity (BMI 35.0-39.9) with comorbidity ([place])    Discussed weight gain noted, encouraged healthy diet and lifestyle changes to affect sustainable weight loss.       Left medial knee pain    Ongoing since work injury (twisting knee) anticipate meniscal injury vs DJD. Discussed f/u with sports med for further evaluation.       Hyperglycemia    Again elevated. Reviewed possible prediabetes, encouraged limiting added sugars in diet.       HLD (hyperlipidemia)    Chronic, deteriorated. Reviewed diet changes to improve triglycerides. He will also start fish oil daily.  The 10-year ASCVD risk score [name] DC Brooke Bonito., et al., 2013) is: 12.3%   Values used to calculate the  score:     Age: 37 years     Sex: Male     Is Non-Hispanic African American: No     Diabetic: No     Tobacco smoker: No     Systolic Blood Pressure: [id number] mmHg     Is BP treated: No     HDL Cholesterol: 42.8 mg/dL     Total Cholesterol: 187 mg/dL       Healthcare maintenance - Primary    Preventative protocols reviewed and updated unless pt declined. Discussed healthy diet and lifestyle.       Elevated blood pressure reading without diagnosis of hypertension    Discussed limiting sodium in diet, encouraged start monitoring BP more frequently and let me know if consistently >140/90.        Other Visit Diagnoses    Need for influenza vaccination       Relevant Orders   Flu Vaccine QUAD 36+ mos IM (Completed)       Meds ordered this encounter  Medications  . Omega-3 Fatty Acids (FISH OIL) 1000 MG CAPS    Sig: Take 1-2 capsules (1,000-2,000 mg total) by mouth daily.    Refill:  0   Orders Placed This Encounter  Procedures  . Flu Vaccine QUAD 36+ mos IM  . Ambulatory referral to Pulmonology    Referral Priority:   Routine    Referral Type:   Consultation    Referral Reason:   Specialty Services Required    Requested Specialty:   Pulmonary Disease    Number of Visits Requested:   1    Follow up plan: Return in about 1 year (around 11/10/2019) for annual exam, prior fasting for blood work.  Ria Bush,  MD

## 2018-11-10 NOTE — Assessment & Plan Note (Signed)
Discussed limiting sodium in diet, encouraged start monitoring BP more frequently and let me know if consistently >140/90.

## 2018-11-10 NOTE — Patient Instructions (Addendum)
Flu shot today Blood pressure is borderline today - start monitoring more closely and let me know if consistently >140/90.  You are doing well today Return as needed or in 1 year for next physical. Consider seeing our sports medicine Dr Lorelei Pont to check left knee - possible meniscal injury  Health Maintenance, Male Adopting a healthy lifestyle and getting preventive care are important in promoting health and wellness. Ask your health care provider about:  The right schedule for you to have regular tests and exams.  Things you can do on your own to prevent diseases and keep yourself healthy. What should I know about diet, weight, and exercise? Eat a healthy diet   Eat a diet that includes plenty of vegetables, fruits, low-fat dairy products, and lean protein.  Do not eat a lot of foods that are high in solid fats, added sugars, or sodium. Maintain a healthy weight Body mass index (BMI) is a measurement that can be used to identify possible weight problems. It estimates body fat based on height and weight. Your health care provider can help determine your BMI and help you achieve or maintain a healthy weight. Get regular exercise Get regular exercise. This is one of the most important things you can do for your health. Most adults should:  Exercise for at least 150 minutes each week. The exercise should increase your heart rate and make you sweat (moderate-intensity exercise).  Do strengthening exercises at least twice a week. This is in addition to the moderate-intensity exercise.  Spend less time sitting. Even light physical activity can be beneficial. Watch cholesterol and blood lipids Have your blood tested for lipids and cholesterol at 63 years of age, then have this test every 5 years. You may need to have your cholesterol levels checked more often if:  Your lipid or cholesterol levels are high.  You are older than 63 years of age.  You are at high risk for heart  disease. What should I know about cancer screening? Many types of cancers can be detected early and may often be prevented. Depending on your health history and family history, you may need to have cancer screening at various ages. This may include screening for:  Colorectal cancer.  Prostate cancer.  Skin cancer.  Lung cancer. What should I know about heart disease, diabetes, and high blood pressure? Blood pressure and heart disease  High blood pressure causes heart disease and increases the risk of stroke. This is more likely to develop in people who have high blood pressure readings, are of African descent, or are overweight.  Talk with your health care provider about your target blood pressure readings.  Have your blood pressure checked: ? Every 3-5 years if you are 28-43 years of age. ? Every year if you are 38 years old or older.  If you are between the ages of 26 and 40 and are a current or former smoker, ask your health care provider if you should have a one-time screening for abdominal aortic aneurysm (AAA). Diabetes Have regular diabetes screenings. This checks your fasting blood sugar level. Have the screening done:  Once every three years after age 94 if you are at a normal weight and have a low risk for diabetes.  More often and at a younger age if you are overweight or have a high risk for diabetes. What should I know about preventing infection? Hepatitis B If you have a higher risk for hepatitis B, you should be screened for this virus.  Talk with your health care provider to find out if you are at risk for hepatitis B infection. Hepatitis C Blood testing is recommended for:  Everyone born from [birth year] through 1965.  Anyone with known risk factors for hepatitis C. Sexually transmitted infections (STIs)  You should be screened each year for STIs, including gonorrhea and chlamydia, if: ? You are sexually active and are younger than 63 years of age. ? You are older  than 63 years of age and your health care provider tells you that you are at risk for this type of infection. ? Your sexual activity has changed since you were last screened, and you are at increased risk for chlamydia or gonorrhea. Ask your health care provider if you are at risk.  Ask your health care provider about whether you are at high risk for HIV. Your health care provider may recommend a prescription medicine to help prevent HIV infection. If you choose to take medicine to prevent HIV, you should first get tested for HIV. You should then be tested every 3 months for as long as you are taking the medicine. Follow these instructions at home: Lifestyle  Do not use any products that contain nicotine or tobacco, such as cigarettes, e-cigarettes, and chewing tobacco. If you need help quitting, ask your health care provider.  Do not use street drugs.  Do not share needles.  Ask your health care provider for help if you need support or information about quitting drugs. Alcohol use  Do not drink alcohol if your health care provider tells you not to drink.  If you drink alcohol: ? Limit how much you have to 0-2 drinks a day. ? Be aware of how much alcohol is in your drink. In the [place] one drink equals one 12 oz bottle of beer (355 mL), one 5 oz glass of wine (148 mL), or one 1 oz glass of hard liquor (44 mL). General instructions  Schedule regular health, dental, and eye exams.  Stay current with your vaccines.  Tell your health care provider if: ? You often feel depressed. ? You have ever been abused or do not feel safe at home. Summary  Adopting a healthy lifestyle and getting preventive care are important in promoting health and wellness.  Follow your health care provider's instructions about healthy diet, exercising, and getting tested or screened for diseases.  Follow your health care provider's instructions on monitoring your cholesterol and blood pressure. This  information is not intended to replace advice given to you by your health care provider. Make sure you discuss any questions you have with your health care provider. Document Released: 08/29/2007 Document Revised: 02/23/2018 Document Reviewed: 02/23/2018 Elsevier Patient Education  2020 [employer].

## 2018-11-10 NOTE — Assessment & Plan Note (Signed)
Again elevated. Reviewed possible prediabetes, encouraged limiting added sugars in diet.

## 2018-11-10 NOTE — Assessment & Plan Note (Signed)
Preventative protocols reviewed and updated unless pt declined. Discussed healthy diet and lifestyle.  

## 2018-11-10 NOTE — Assessment & Plan Note (Signed)
Discussed weight gain noted, encouraged healthy diet and lifestyle changes to affect sustainable weight loss.

## 2018-11-10 NOTE — Assessment & Plan Note (Signed)
Ongoing. Bothersome to wife. Also endorses non restorative sleep and daytime somnolence. ESS = 8. Reasonable to refer for further evaluation.

## 2018-11-10 NOTE — Assessment & Plan Note (Signed)
Ongoing since work injury (twisting knee) anticipate meniscal injury vs DJD. Discussed f/u with sports med for further evaluation.

## 2018-11-10 NOTE — Assessment & Plan Note (Signed)
Chronic, deteriorated. Reviewed diet changes to improve triglycerides. He will also start fish oil daily.  The 10-year ASCVD risk score [name] DC Brooke Bonito., et al., 2013) is: 12.3%   Values used to calculate the score:     Age: 63 years     Sex: Male     Is Non-Hispanic African American: No     Diabetic: No     Tobacco smoker: No     Systolic Blood Pressure: [id number] mmHg     Is BP treated: No     HDL Cholesterol: 42.8 mg/dL     Total Cholesterol: 187 mg/dL

## 2018-11-14 ENCOUNTER — Encounter: Payer: BC Managed Care – PPO | Admitting: Family Medicine

## 2019-01-16 ENCOUNTER — Encounter: Payer: Self-pay | Admitting: Internal Medicine

## 2019-01-16 ENCOUNTER — Ambulatory Visit: Payer: BC Managed Care – PPO | Admitting: Internal Medicine

## 2019-01-16 ENCOUNTER — Other Ambulatory Visit: Payer: Self-pay

## 2019-01-16 VITALS — BP 124/78 | HR 110 | Temp 98.6°F | Ht 65.5 in | Wt 220.0 lb

## 2019-01-16 DIAGNOSIS — G4719 Other hypersomnia: Secondary | ICD-10-CM | POA: Diagnosis not present

## 2019-01-16 NOTE — Progress Notes (Signed)
Name: Ricky Barton MRN: [id number] DOB: December 20, 1955     CONSULTATION DATE: 01/16/2019 REFERRING MD : Danise Mina  CHIEF COMPLAINT: Excessive daytime sleepiness   HISTORY OF PRESENT ILLNESS:    Patient is seen today for problems and issues with sleep related to excessive daytime sleepiness Patient  has been having sleep problems for many years Patient has been having excessive daytime sleepiness for a long time Patient has been having extreme fatigue and tiredness, lack of energy +  very Loud snoring every night + struggling breathe at night and gasps for air   Discussed sleep data and reviewed with patient.  Encouraged proper weight management.  Discussed driving precautions and its relationship with hypersomnolence.  Discussed operating dangerous equipment and its relationship with hypersomnolence.  Discussed sleep hygiene, and benefits of a fixed sleep waked time.  The importance of getting eight or more hours of sleep discussed with patient.  Discussed limiting the use of the computer and television before bedtime.  Decrease naps during the day, so night time sleep will become enhanced.  Limit caffeine, and sleep deprivation.  HTN, stroke, and heart failure are potential risk factors.    EPWORTH SLEEP SCORE 7   No evidence of heart failure at this time No evidence or signs of infection at this time No respiratory distress No fevers, chills, nausea, vomiting, diarrhea No evidence of lower extremity edema No evidence hemoptysis   PAST MEDICAL HISTORY :   has a past medical history of Arthritis, Hypoglycemic syndrome, and Seizures ([place]) (1990s).  has a past surgical history that includes Cholecystectomy (1989); Head MRI (06/02); Colonoscopy (03/2011); Polypectomy; and Colonoscopy (07/2015). Prior to Admission medications   Medication Sig Start Date End Date Taking? Authorizing Provider  acetaminophen (TYLENOL) 500 MG tablet Take 500 mg by mouth every 6 (six) hours  as needed.   Yes [provider]  aspirin 81 MG EC tablet Take 81 mg by mouth every Monday, Wednesday, and Friday. Swallow whole.   Yes [provider]  ibuprofen (ADVIL,MOTRIN) 200 MG tablet Take 200 mg by mouth as needed.     Yes [provider]  Omega-3 Fatty Acids (FISH OIL) 1000 MG CAPS Take 1-2 capsules (1,000-2,000 mg total) by mouth daily. 11/10/18  Yes Ria Bush, MD  sildenafil (VIAGRA) 100 MG tablet TAKE 1/2 TO 1 TABLET BY MOUTH 1 HOUR PRIOR AS NEEDED 04/05/14  Yes Ria Bush, MD   Allergies  Allergen Reactions  . Piroxicam Rash    blisters    FAMILY HISTORY:  family history includes CAD (age of onset: 48) in his brother; CAD (age of onset: 71) in his brother; Cancer (age of onset: 74) in his father; Colon polyps in his mother; Hyperlipidemia in his brother, brother, brother, and brother; Hypertension in his mother; Multiple sclerosis in his brother; Pancreatic cancer (age of onset: 41) in his brother; Sleep apnea in his brother. SOCIAL HISTORY:  reports that he has never smoked. He has never used smokeless tobacco. He reports that he does not drink alcohol or use drugs.  REVIEW OF SYSTEMS:   Constitutional: Negative for fever, chills, weight loss, malaise/fatigue and diaphoresis.  HENT: Negative for hearing loss, ear pain, nosebleeds, congestion, sore throat, neck pain, tinnitus and ear discharge.   Eyes: Negative for blurred vision, double vision, photophobia, pain, discharge and redness.  Respiratory: Negative for cough, hemoptysis, sputum production, shortness of breath, wheezing and stridor.   Cardiovascular: Negative for chest pain, palpitations, orthopnea, claudication, leg swelling and PND.  Gastrointestinal: Negative for heartburn, nausea, vomiting, abdominal pain, diarrhea, constipation, blood in stool and melena.  Genitourinary: Negative for dysuria, urgency, frequency, hematuria and flank pain.  Musculoskeletal: Negative for  myalgias, back pain, joint pain and falls.  Skin: Negative for itching and rash.  Neurological: Negative for dizziness, tingling, tremors, sensory change, speech change, focal weakness, seizures, loss of consciousness, weakness and headaches.  Endo/Heme/Allergies: Negative for environmental allergies and polydipsia. Does not bruise/bleed easily.  ALL OTHER ROS ARE NEGATIVE   BP 124/78 (BP Location: Left Arm, Cuff Size: Normal)   Pulse (!) 110   Temp 98.6 F (37 C) (Temporal)   Ht 5' 5.5" (1.664 m)   Wt 220 lb (99.8 kg)   SpO2 97%   BMI 36.05 kg/m    Physical Examination:   GENERAL:NAD, no fevers, chills, no weakness no fatigue HEAD: Normocephalic, atraumatic.  EYES: Pupils equal, round, reactive to light. Extraocular muscles intact. No scleral icterus.  NECK: Supple. No thyromegaly. No nodules. No JVD.  PULMONARY:CTA B/L no wheezes, no crackles, no rhonchi CARDIOVASCULAR: S1 and S2. Regular rate and rhythm. No murmurs, rubs, or gallops. No edema.  GASTROINTESTINAL: Soft, nontender, nondistended. No masses. Positive bowel sounds.  MUSCULOSKELETAL: No swelling, clubbing, or edema. Range of motion full in all extremities.  NEUROLOGIC: Cranial nerves II through XII are intact. No gross focal neurological deficits.  SKIN: No ulceration, lesions, rashes, or cyanosis. Skin warm and dry. Turgor intact.  PSYCHIATRIC: Mood, affect within normal limits. The patient is awake, alert and oriented x 3. Insight, judgment intact.     ASSESSMENT AND PLAN SYNOPSIS  Signs symptoms of excessive daytime sleepiness possible underlying obstructive sleep apnea Patient will need sleep study for definitive diagnosis  Obesity -recommend significant weight loss -recommend changing diet  Deconditioned state -Recommend increased daily activity and exercise   COVID-19 EDUCATION: The signs and symptoms of COVID-19 were discussed with the patient and how to seek care for testing.  The importance of  social distancing was discussed today. Hand Washing Techniques and avoid touching face was advised.     MEDICATION ADJUSTMENTS/LABS AND TESTS ORDERED: Recommend SLeep study   CURRENT MEDICATIONS REVIEWED AT LENGTH WITH PATIENT TODAY   Patient satisfied with Plan of action and management. All questions answered  Follow up in 3 months   Chasiti Waddington Patricia Pesa, M.D.  [facility]  Medical Director [place] Director [facility] Cardio-Pulmonary Department

## 2019-01-16 NOTE — Patient Instructions (Signed)
Obtain sleep study.

## 2019-02-01 ENCOUNTER — Other Ambulatory Visit: Payer: Self-pay

## 2019-02-01 ENCOUNTER — Ambulatory Visit: Payer: BC Managed Care – PPO

## 2019-02-01 DIAGNOSIS — G4719 Other hypersomnia: Secondary | ICD-10-CM

## 2019-02-01 DIAGNOSIS — G4733 Obstructive sleep apnea (adult) (pediatric): Secondary | ICD-10-CM

## 2019-02-03 ENCOUNTER — Telehealth: Payer: Self-pay | Admitting: Pulmonary Disease

## 2019-02-03 DIAGNOSIS — G4733 Obstructive sleep apnea (adult) (pediatric): Secondary | ICD-10-CM | POA: Diagnosis not present

## 2019-02-03 NOTE — Telephone Encounter (Signed)
HST 02/01/19 >> AHI 53.4, SpO2 low 67%   Please inform him that his sleep study shows severe obstructive sleep apnea.  Please arrange for ROV with Dr. Mortimer Fries or NP to discuss treatment options.

## 2019-02-03 NOTE — Telephone Encounter (Signed)
Spoke to pt and attempted to relay results. Pt is busy at this time and will call back for results and to schedule ov.

## 2019-02-03 NOTE — Telephone Encounter (Signed)
Pt is aware of results and voiced his understanding.  OV has been scheduled for 02/16/2019 at 10:15a with DK. Nothing further is needed.

## 2019-02-16 ENCOUNTER — Ambulatory Visit (INDEPENDENT_AMBULATORY_CARE_PROVIDER_SITE_OTHER): Payer: BC Managed Care – PPO | Admitting: Internal Medicine

## 2019-02-16 ENCOUNTER — Encounter: Payer: Self-pay | Admitting: Internal Medicine

## 2019-02-16 DIAGNOSIS — G4733 Obstructive sleep apnea (adult) (pediatric): Secondary | ICD-10-CM

## 2019-02-16 NOTE — Patient Instructions (Signed)
MEDICATION ADJUSTMENTS/LABS AND TESTS ORDERED: Start AUTOCPAP 5-20 cm h20

## 2019-02-16 NOTE — Addendum Note (Signed)
Addended by: Maryanna Shape A on: 02/16/2019 10:31 AM   Modules accepted: Orders

## 2019-02-16 NOTE — Progress Notes (Signed)
Name: Ricky Barton MRN: [id number] DOB: 1955/12/21     I connected with the patient by telephone enabled telemedicine visit and verified that I am speaking with the correct person using two identifiers.    I discussed the limitations, risks, security and privacy concerns of performing an evaluation and management service by telemedicine and the availability of in-person appointments. I also discussed with the patient that there may be a patient responsible charge related to this service. The patient expressed understanding and agreed to proceed.  PATIENT AGREES AND CONFIRMS -YES   Other persons participating in the visit and their role in the encounter: Patient, nursing  This visit type was conducted due to national recommendations for restrictions regarding the COVID-19 Pandemic (e.g. social distancing).  This format is felt to be most appropriate for this patient at this time.  All issues noted in this document were discussed and addressed.     HST 02/01/19 >> AHI 53.4, SpO2 low 67%  CHIEF COMPLAINT: follow up OSA   HISTORY OF PRESENT ILLNESS:   No evidence of heart failure at this time No evidence or signs of infection at this time No respiratory distress No fevers, chills, nausea, vomiting, diarrhea No evidence of lower extremity edema No evidence hemoptysis  HST results discussed with patient     PAST MEDICAL HISTORY :   has a past medical history of Arthritis, Hypoglycemic syndrome, and Seizures ([place]) (1990s).  has a past surgical history that includes Cholecystectomy (1989); Head MRI (06/02); Colonoscopy (03/2011); Polypectomy; and Colonoscopy (07/2015). Prior to Admission medications   Medication Sig Start Date End Date Taking? Authorizing Provider  acetaminophen (TYLENOL) 500 MG tablet Take 500 mg by mouth every 6 (six) hours as needed.   Yes [provider]  aspirin 81 MG EC tablet Take 81 mg by mouth every Monday, Wednesday, and Friday. Swallow whole.    Yes [provider]  ibuprofen (ADVIL,MOTRIN) 200 MG tablet Take 200 mg by mouth as needed.     Yes [provider]  Omega-3 Fatty Acids (FISH OIL) 1000 MG CAPS Take 1-2 capsules (1,000-2,000 mg total) by mouth daily. 11/10/18  Yes Ria Bush, MD  sildenafil (VIAGRA) 100 MG tablet TAKE 1/2 TO 1 TABLET BY MOUTH 1 HOUR PRIOR AS NEEDED 04/05/14  Yes Ria Bush, MD   Allergies  Allergen Reactions  . Piroxicam Rash    blisters    FAMILY HISTORY:  family history includes CAD (age of onset: 42) in his brother; CAD (age of onset: 64) in his brother; Cancer (age of onset: 86) in his father; Colon polyps in his mother; Hyperlipidemia in his brother, brother, brother, and brother; Hypertension in his mother; Multiple sclerosis in his brother; Pancreatic cancer (age of onset: 15) in his brother; Sleep apnea in his brother. SOCIAL HISTORY:  reports that he has never smoked. He has never used smokeless tobacco. He reports that he does not drink alcohol or use drugs.   Review of Systems:  Gen:  Denies  fever, sweats, chills weight loss  HEENT: Denies blurred vision, double vision, ear pain, eye pain, hearing loss, nose bleeds, sore throat Cardiac:  No dizziness, chest pain or heaviness, chest tightness,edema, No JVD Resp:   No cough, -sputum production, -shortness of breath,-wheezing, -hemoptysis,  Gi: Denies swallowing difficulty, stomach pain, nausea or vomiting, diarrhea, constipation, bowel incontinence Gu:  Denies bladder incontinence, burning urine Ext:   Denies Joint pain, stiffness or swelling Skin: Denies  skin rash, easy bruising or bleeding  or hives Endoc:  Denies polyuria, polydipsia , polyphagia or weight change Psych:   Denies depression, insomnia or hallucinations  Other:  All other systems negative   ALL OTHER ROS ARE NEGATIVE       ASSESSMENT AND PLAN SYNOPSIS  Severe OSA Needs to start autoCPAP 5-20 cm h20   Obesity -recommend  significant weight loss -recommend changing diet  Deconditioned state -Recommend increased daily activity and exercise     COVID-19 EDUCATION: The signs and symptoms of COVID-19 were discussed with the patient and how to seek care for testing.  The importance of social distancing was discussed today. Hand Washing Techniques and avoid touching face was advised.     MEDICATION ADJUSTMENTS/LABS AND TESTS ORDERED: Start AUTOCPAP 5-20 cm h20   CURRENT MEDICATIONS REVIEWED AT LENGTH WITH PATIENT TODAY   Patient satisfied with Plan of action and management. All questions answered  Follow up in 3 months Total time spent 22 mins  Maretta Bees Patricia Pesa, M.D.  [facility]  Medical Director [place] Director [facility] Cardio-Pulmonary Department

## 2019-02-27 ENCOUNTER — Telehealth: Payer: Self-pay | Admitting: Internal Medicine

## 2019-02-27 DIAGNOSIS — G4733 Obstructive sleep apnea (adult) (pediatric): Secondary | ICD-10-CM

## 2019-02-27 NOTE — Telephone Encounter (Signed)
Spoke to pt, who stated that he would like to get cpap with [facility] vs [facility].  Order has been placed to [facility].  Nothing further is needed.

## 2019-03-06 ENCOUNTER — Other Ambulatory Visit: Payer: Self-pay

## 2019-03-06 ENCOUNTER — Emergency Department (HOSPITAL_COMMUNITY): Payer: BC Managed Care – PPO

## 2019-03-06 ENCOUNTER — Emergency Department (HOSPITAL_COMMUNITY)
Admission: EM | Admit: 2019-03-06 | Discharge: 2019-03-06 | Disposition: A | Discharge status: Home / Self Care | Payer: BC Managed Care – PPO | Attending: Emergency Medicine | Admitting: Emergency Medicine

## 2019-03-06 ENCOUNTER — Encounter (HOSPITAL_COMMUNITY): Payer: Self-pay | Admitting: Emergency Medicine

## 2019-03-06 ENCOUNTER — Telehealth: Payer: Self-pay | Admitting: Internal Medicine

## 2019-03-06 ENCOUNTER — Telehealth: Payer: Self-pay

## 2019-03-06 DIAGNOSIS — R0602 Shortness of breath: Secondary | ICD-10-CM | POA: Diagnosis not present

## 2019-03-06 LAB — BASIC METABOLIC PANEL
Anion gap: 11 (ref 5–15)
BUN: 11 mg/dL (ref 8–23)
CO2: 23 mmol/L (ref 22–32)
Calcium: 9.3 mg/dL (ref 8.9–10.3)
Chloride: 105 mmol/L (ref 98–111)
Creatinine, Ser: 0.84 mg/dL (ref 0.61–1.24)
GFR calc Af Amer: >60 mL/min (ref >60)
GFR calc non Af Amer: >60 mL/min (ref >60)
Glucose, Bld: 97 mg/dL (ref 70–99)
Potassium: 3.9 mmol/L (ref 3.5–5.1)
Sodium: 139 mmol/L (ref 135–145)

## 2019-03-06 LAB — D-DIMER, QUANTITATIVE: D-Dimer, Quant: 0.27 ug/mL-FEU (ref 0.00–0.50)

## 2019-03-06 LAB — CBC
HCT: 45 % (ref 39.0–52.0)
Hemoglobin: 14.9 g/dL (ref 13.0–17.0)
MCH: 30.8 pg (ref 26.0–34.0)
MCHC: 33.1 g/dL (ref 30.0–36.0)
MCV: 93 fL (ref 80.0–100.0)
Platelets: 355 10*3/uL (ref 150–400)
RBC: 4.84 MIL/uL (ref 4.22–5.81)
RDW: 12.6 % (ref 11.5–15.5)
WBC: 12 10*3/uL — ABNORMAL HIGH (ref 4.0–10.5)
nRBC: 0 % (ref 0.0–0.2)

## 2019-03-06 LAB — TROPONIN I (HIGH SENSITIVITY)
Troponin I (High Sensitivity): 4 ng/L (ref <18)
Troponin I (High Sensitivity): 3 ng/L (ref <18)

## 2019-03-06 LAB — CBG MONITORING, ED
Glucose-Capillary: 92 mg/dL (ref 70–99)
Glucose-Capillary: 78 mg/dL (ref 70–99)

## 2019-03-06 MED ORDER — ALBUTEROL SULFATE HFA 108 (90 BASE) MCG/ACT IN AERS
2.0000 | INHALATION_SPRAY | Freq: Once | RESPIRATORY_TRACT | Status: AC
  Administered 2019-03-06: 2 via RESPIRATORY_TRACT
  Filled 2019-03-06: qty 6.7

## 2019-03-06 NOTE — ED Provider Notes (Signed)
[facility] Emergency Department Provider Note MRN:  [id number]  Arrival date & time: 03/06/19     Chief Complaint   Shortness of Breath   History of Present Illness   Ricky Barton is a 63 y.o. year-old male with a history of seizure disorder, OSA presenting to the ED with chief complaint of shortness of breath.  Patient explains that he recently started on CPAP at home when sleeping, has been having trouble getting used to it.  Has been experiencing shortness of breath for the past 2 or 3 days, trouble taking a deep breath.  Also endorsing pressure-like chest pain, intermittent.  Does not seem to have any exacerbating or alleviating factors.  Occurs randomly.  Could not tolerate the CPAP last night.  Denies fever, no cough, no abdominal pain, no numbness or weakness to the arms or legs.  Review of Systems  A complete 10 system review of systems was obtained and all systems are negative except as noted in the HPI and PMH.   Patient's Health History    Past Medical History:  Diagnosis Date  . Arthritis   . Hypoglycemic syndrome   . Seizures ([place]) 1990s   isolated, none since, was on dilantin for 3 yrs, then stopped    Past Surgical History:  Procedure Laterality Date  . CHOLECYSTECTOMY  1989  . COLONOSCOPY  03/2011   3 adenomatous polyps, rec rpt 5 yrs (Dr Fuller Plan).  . COLONOSCOPY  07/2015   WNL, rpt 5 yrs Fuller Plan)  . Head MRI  06/02  . POLYPECTOMY      Family History  Problem Relation Age of Onset  . Hypertension Mother   . Colon polyps Mother   . Cancer Father 69       stomach cancer  . Hyperlipidemia Brother   . CAD Brother 65       MI (smoker)  . Hyperlipidemia Brother   . Sleep apnea Brother   . Pancreatic cancer Brother 50  . Hyperlipidemia Brother   . CAD Brother 36       MI (smoker)  . Hyperlipidemia Brother   . Multiple sclerosis Brother   . Colon cancer Neg Hx   . Esophageal cancer Neg Hx   . Rectal cancer Neg Hx     Social  History   Socioeconomic History  . Marital status: Married    Spouse name: Not on file  . Number of children: 2  . Years of education: Not on file  . Highest education level: Not on file  Occupational History  . Occupation: Route Social research officer, government: [employer]    Comment: [employer]  Tobacco Use  . Smoking status: Never Smoker  . Smokeless tobacco: Never Used  Substance and Sexual Activity  . Alcohol use: No  . Drug use: No  . Sexual activity: Yes  Other Topics Concern  . Not on file  Social History Narrative   Caffeine: occasional coffee/tea   Married and lives with wife, grown children, 2 dogs   Occupation: [employer]   Activity: no regular exercise, some golfing   Diet: good water, fruits/vegetables daily   Social Determinants of Health   Financial Resource Strain:   . Difficulty of Paying Living Expenses: Not on file  Food Insecurity:   . Worried About Charity fundraiser in the Last Year: Not on file  . Ran Out of Food in the Last Year: Not on file  Transportation Needs:   .  Lack of Transportation (Medical): Not on file  . Lack of Transportation (Non-Medical): Not on file  Physical Activity:   . Days of Exercise per Week: Not on file  . Minutes of Exercise per Session: Not on file  Stress:   . Feeling of Stress : Not on file  Social Connections:   . Frequency of Communication with Friends and Family: Not on file  . Frequency of Social Gatherings with Friends and Family: Not on file  . Attends Religious Services: Not on file  . Active Member of Clubs or Organizations: Not on file  . Attends Archivist Meetings: Not on file  . Marital Status: Not on file  Intimate Partner Violence:   . Fear of Current or Ex-Partner: Not on file  . Emotionally Abused: Not on file  . Physically Abused: Not on file  . Sexually Abused: Not on file     Physical Exam  Vital Signs and Nursing Notes reviewed Vitals:   03/06/19 2100 03/06/19 2200    BP:    Pulse: 89 89  Resp: (!) 8 15  Temp:    SpO2: 98% 99%    CONSTITUTIONAL: Well-appearing, NAD NEURO:  Alert and oriented x 3, no focal deficits EYES:  eyes equal and reactive ENT/NECK:  no LAD, no JVD CARDIO: Regular rate, well-perfused, normal S1 and S2 PULM:  CTAB no wheezing or rhonchi GI/GU:  normal bowel sounds, non-distended, non-tender MSK/SPINE:  No gross deformities, no edema SKIN:  no rash, atraumatic PSYCH:  Appropriate speech and behavior  Diagnostic and Interventional Summary    EKG Interpretation  Date/Time:  Monday March 06 2019 17:05:21 EST Ventricular Rate:  112 PR Interval:  170 QRS Duration: 80 QT Interval:  328 QTC Calculation: 447 R Axis:   18 Text Interpretation: Sinus tachycardia Otherwise normal ECG Confirmed by Gerlene Fee [phone]) on 03/06/2019 8:13:43 PM      Labs Reviewed  CBC - Abnormal; Notable for the following components:      Result Value   WBC 12.0 (*)    All other components within normal limits  BASIC METABOLIC PANEL  D-DIMER, QUANTITATIVE (NOT AT [facility])  CBG MONITORING, ED  CBG MONITORING, ED  TROPONIN I (HIGH SENSITIVITY)  TROPONIN I (HIGH SENSITIVITY)    DG Chest 2 View  Final Result      Medications  albuterol (VENTOLIN HFA) 108 (90 Base) MCG/ACT inhaler 2 puff (has no administration in time range)     Procedures  /  Critical Care Procedures  ED Course and Medical Decision Making  I have reviewed the triage vital signs and the nursing notes.  Pertinent labs & imaging results that were available during my care of the patient were reviewed by me and considered in my medical decision making (see below for details).     Chest pain and shortness of breath, considering ACS, pulmonary embolism, doubt dissection.  Patient is well-appearing on exam, was initially tachycardic but this has resolved with time.  Screening with D-dimer, second troponin pending.  Troponin negative x2, D-dimer negative, x-ray  unremarkable.  Patient continues to look and feel well, normal vital signs, no increased work of breathing, appropriate for discharge.  Barth Kirks. Sedonia Small, MD [place] [email or web address]  Final Clinical Impressions(s) / ED Diagnoses     ICD-10-CM   1. SOB (shortness of breath)  R06.02     ED Discharge Orders    None       Discharge Instructions  Discussed with and Provided to Patient:     Discharge Instructions     You were evaluated in the Emergency Department and after careful evaluation, we did not find any emergent condition requiring admission or further testing in the hospital.  Your exam/testing today was overall reassuring.  Please return to the Emergency Department if you experience any worsening of your condition.  We encourage you to follow up with a primary care provider.  Thank you for allowing [place] to be a part of your care.        Maudie Flakes, MD 03/06/19 2312

## 2019-03-06 NOTE — Telephone Encounter (Signed)
Pt has been having acid reflux with S/T and throat swelling that comes and goes for few months. Having chest tightness and pain with squeezing sensation on lt side of chest that goes into lt side of throat. Last night pt had trouble getting his breath. pts T 98.3 and pt having problems using and adjusting his cpap; pt will call pulmonology about c pap but pt will go to [facility] ED now for eval and possible testing, EKG or xrays. Offered pt 911 assistance but pt declined; pt said he can go to Ed in car.Pt voiced understanding and FYI to Dr Darnell Level.

## 2019-03-06 NOTE — Telephone Encounter (Signed)
Recommend virtual OV or ER visit

## 2019-03-06 NOTE — ED Notes (Signed)
Patient transported to X-ray 

## 2019-03-06 NOTE — Telephone Encounter (Signed)
Pt has been scheduled for virtual visit with TP on 03/07/2019 at 3:30. Pt stated that he would discuss with his spouse, and may just decide to go to ED. Nothing further is needed.

## 2019-03-06 NOTE — Telephone Encounter (Signed)
Called and spoke to pt.  Pt stated that he started cpap Wednesday and he developed chest tightness, throat tightness, trouble taking deep breath and feeling panicky. These sx linger throughout the day. Pt did not use his cpap last night and sx were still presents.  Pt is requesting recommendations.   DK, please advise. Thanks

## 2019-03-06 NOTE — Discharge Instructions (Addendum)
You were evaluated in the Emergency Department and after careful evaluation, we did not find any emergent condition requiring admission or further testing in the hospital. ° °Your exam/testing today was overall reassuring. ° °Please return to the Emergency Department if you experience any worsening of your condition.  We encourage you to follow up with a primary care provider.  Thank you for allowing us to be a part of your care. ° °

## 2019-03-06 NOTE — ED Triage Notes (Signed)
Pt reports starting on his CPAP machine Wednesday and dealt with SOB the past few days. Reports anxiety.

## 2019-03-07 ENCOUNTER — Telehealth (INDEPENDENT_AMBULATORY_CARE_PROVIDER_SITE_OTHER): Payer: BC Managed Care – PPO | Admitting: Adult Health

## 2019-03-07 ENCOUNTER — Encounter: Payer: Self-pay | Admitting: Adult Health

## 2019-03-07 DIAGNOSIS — G4733 Obstructive sleep apnea (adult) (pediatric): Secondary | ICD-10-CM

## 2019-03-07 DIAGNOSIS — Z9989 Dependence on other enabling machines and devices: Secondary | ICD-10-CM | POA: Diagnosis not present

## 2019-03-07 DIAGNOSIS — R0602 Shortness of breath: Secondary | ICD-10-CM | POA: Diagnosis not present

## 2019-03-07 DIAGNOSIS — R0789 Other chest pain: Secondary | ICD-10-CM

## 2019-03-07 NOTE — Patient Instructions (Addendum)
Saline nasal spray Twice daily   Saline nasal gel At bedtime   Continue on CPAP At bedtime   Change CPAP mask as discussed .  Decrease CPAP pressure to 5 to 15 cm .  Follow up with in 1 month and As needed   Please contact office for sooner follow up if symptoms do not improve or worsen or seek emergency care

## 2019-03-07 NOTE — Telephone Encounter (Signed)
Reviewed reassuring ER eval.  plz call for update on symptoms - I believe he also has pulm appt scheduled for today - agree he keep that.

## 2019-03-07 NOTE — Telephone Encounter (Signed)
Left message on vm per dpr relaying Dr. Synthia Innocent message.  Asked pt to call back with an update.

## 2019-03-07 NOTE — Progress Notes (Signed)
Virtual Visit via Video Note  I connected with Ricky Barton on 03/07/19 at  3:30 PM EST by a video enabled telemedicine application and verified that I am speaking with the correct person using two identifiers.  Location: Patient: Home  Provider: Office    I discussed the limitations of evaluation and management by telemedicine and the availability of in person appointments. The patient expressed understanding and agreed to proceed.  History of Present Illness: 63 year old male seen for sleep consult November 2020.  Found to have severe obstructive sleep apnea on February 01, 2019 sleep study.  Sleep study showed AHI 53.4 with SPO2 low at 67%.  Patient was recently started on nocturnal CPAP.  He has been on it for the last 3 nights.  Patient says that yesterday he developed shortness of breath and chest tightness.  He subsequently went to the emergency room.  ER work-up was essentially unrevealing with a negative D-dimer, troponin.  Chest x-ray was clear.  White blood cell count was mildly elevated.  Hemoglobin was normal.  Patient says over few days he has had nasal stuffiness and nasal dryness.  Felt like he could not breathe is good through his nose.  He is also had a mild dry cough.  He has had no fever no loss of taste or smell.  No known sick contacts.  And no sore throat. Patient was concerned that his CPAP might be causing his above symptoms with chest pain or shortness of breath.  CPAP download shows excellent compliance since starting CPAP with average usage at 5.5 hours.  AHI is 7.9.  Leaks minimal.  Patient says he is going to go by his homecare company and change mask and see if that helps at all.   Patient Active Problem List   Diagnosis Date Noted  . Elevated blood pressure reading without diagnosis of hypertension 11/10/2018  . Right lower quadrant abdominal pain 03/28/2018  . Renal lesion 03/28/2018  . Left medial knee pain 10/06/2016  . Arthralgia of hands, bilateral  09/12/2015  . Snoring 09/12/2015  . Severe obesity (BMI 35.0-39.9) with comorbidity ([place]) 04/05/2014  . Hyperglycemia 03/30/2013  . Healthcare maintenance 03/04/2012  . HLD (hyperlipidemia) 01/18/2009  . 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 Seizures ([place]) 08/15/1991   Current Outpatient Medications on File Prior to Visit  Medication Sig Dispense Refill  . acetaminophen (TYLENOL) 500 MG tablet Take 500 mg by mouth every 6 (six) hours as needed.    Marland Kitchen aspirin 81 MG EC tablet Take 81 mg by mouth every Monday, Wednesday, and Friday. Swallow whole.    . ibuprofen (ADVIL,MOTRIN) 200 MG tablet Take 200 mg by mouth as needed.      . Omega-3 Fatty Acids (FISH OIL) 1000 MG CAPS Take 1-2 capsules (1,000-2,000 mg total) by mouth daily.  0  . sildenafil (VIAGRA) 100 MG tablet TAKE 1/2 TO 1 TABLET BY MOUTH 1 HOUR PRIOR AS NEEDED 9 tablet 6   No current facility-administered medications on file prior to visit.    Observations/Objective:  Patient appears in no acute distress.  He is speaking in full sentences.  No obvious accessory muscle use.  HST 02/01/19 >> AHI 53.4, SpO2 low 67%   Assessment and Plan: Severe obstructive sleep apnea.  Patient has gotten off to a good start with his CPAP.  I have advised him to go by his homecare company for a mask fitting and change a mask if needed. Encouraged to use his CPAP each night. Do not  believe that his above symptoms are secondary to CPAP usage however may be causing some nasal dryness.  Have suggested he begin saline nasal spray twice daily and use saline nasal gel at bedtime. Will adjust CPAP pressure for comfort to see if this helps at all.  His daily average pressure on CPAP was at 13  Shortness of breath and chest tightness questionable etiology.  Patient may be getting to have a early viral illness.  Also may have some chronic rhinitis due to nasal dryness. Patient is to monitor symptoms close.  If persist he will need further evaluation  with his primary care provider or make a another visit with [place] or seek emergency care.  ER work-up was revealed.  And was unrevealing.  D-dimer was negative.  Troponin was negative.  Advised him if he develops chest pain he is to seek emergency care.  Plan  Patient Instructions  Saline nasal spray Twice daily   Saline nasal gel At bedtime   Continue on CPAP At bedtime   Change CPAP mask as discussed .  Decrease CPAP pressure to 5 to 15 cm .  Follow up with in 1 month and As needed   Please contact office for sooner follow up if symptoms do not improve or worsen or seek emergency care        Follow Up Instructions: Follow-up in 1 month and as needed   I discussed the assessment and treatment plan with the patient. The patient was provided an opportunity to ask questions and all were answered. The patient agreed with the plan and demonstrated an understanding of the instructions.   The patient was advised to call back or seek an in-person evaluation if the symptoms worsen or if the condition fails to improve as anticipated.  I provided 25  minutes of non-face-to-face time during this encounter.   Rexene Edison, NP

## 2019-03-08 NOTE — Telephone Encounter (Signed)
Left message on vm per dpr relaying Dr. Synthia Innocent message.  Asked pt to call back with an update.

## 2019-03-09 NOTE — Telephone Encounter (Signed)
Left message on vm per dpr relaying Dr. Synthia Innocent message.  Asked pt to call back with an update.

## 2019-03-13 NOTE — Telephone Encounter (Signed)
Spoke with pt relaying Dr. Synthia Innocent message.  Pt verbalizes understanding and states he has a cough and SOB but no fever.  Pt wants to be seen and agrees to virtual visit.

## 2019-03-13 NOTE — Telephone Encounter (Addendum)
Agree with him checking in with CPAP supply company.  If no resolution found, would offer in person OV as long as no cough or fever.

## 2019-03-13 NOTE — Telephone Encounter (Addendum)
Patient called stating that his nose and throat feel real dry. Patient stated that his pulmonologist made changes on his CPAP machine on Wednesday. Patient stated that he has a dry cough and is taking Mucinex which seems to help. Patient stated that he has had loose stools when he goes to the bathroom. Patient stated that he has lost 7 pounds in a week and is afraid to eat because of acid reflux. Patient stated that he feels real bloated. Patient stated that he feels winded and feels that all of his symptoms are related to the CPAP machine. Patient stated that these symptoms started after he started using the machine. Patient stated when he gets off the phone he is going to go over to the company that provided the machine and talk with them about what is going on and see if they have any recommendations. . Patient stated the NP that works with pulmonary told him she doesn't think the symptoms he is having is related to the CPAP machine. ER precautions given to the patient. Patient stated that he went to the ER last week and they did not find anything wrong.

## 2019-03-14 ENCOUNTER — Encounter: Payer: Self-pay | Admitting: Family Medicine

## 2019-03-14 ENCOUNTER — Ambulatory Visit (INDEPENDENT_AMBULATORY_CARE_PROVIDER_SITE_OTHER): Payer: BC Managed Care – PPO | Admitting: Family Medicine

## 2019-03-14 DIAGNOSIS — G4733 Obstructive sleep apnea (adult) (pediatric): Secondary | ICD-10-CM

## 2019-03-14 DIAGNOSIS — R0602 Shortness of breath: Secondary | ICD-10-CM

## 2019-03-14 NOTE — Telephone Encounter (Signed)
Pt scheduled virtual visit today at 4:00 with Dr. Diona Browner.

## 2019-03-14 NOTE — Assessment & Plan Note (Signed)
Changes made by pulmonary may be improving his tolerance of CPAP some. Doing better, wearing CPAP longer but not all night still.,

## 2019-03-14 NOTE — Assessment & Plan Note (Signed)
Improved today. Better with CPAP adjustment, mucolytic, nasal saline and nsasl gel as well as lower pressure and higher humidity setting on CPAP.  Neg cardiac work up in ER, no infectious symptoms.  Started with CPAP... may be anxiety associated. Could consider anxiety treatment if not continuing to improve with cahnges made to CPAP.

## 2019-03-14 NOTE — Progress Notes (Signed)
VIRTUAL VISIT Due to national recommendations of social distancing due to [place] 19, a virtual visit is felt to be most appropriate for this patient at this time.   I connected with the patient on 03/14/19 at  4:00 PM EST by virtual telehealth platform and verified that I am speaking with the correct person using two identifiers.   I discussed the limitations, risks, security and privacy concerns of performing an evaluation and management service by  virtual telehealth platform and the availability of in person appointments. I also discussed with the patient that there may be a patient responsible charge related to this service. The patient expressed understanding and agreed to proceed.  Patient location: Home Provider Location: [facility], [place] Participants: Eliezer Lofts and Chrisandra Netters   Chief Complaint  Patient presents with  . Cough  . Shortness of Breath    History of Present Illness: Cough Associated symptoms include shortness of breath. Pertinent negatives include no chest pain, chills, ear pain, eye redness, fever, hemoptysis, myalgias, rash or wheezing.  Shortness of Breath Pertinent negatives include no abdominal pain, chest pain, ear pain, fever, hemoptysis, leg swelling, rash, sputum production, vomiting or wheezing.  Seen at ER on 03/06/2019.. presenting complaint SOB and pressure like chest pain, nonexrtional.  Started  After starting CPAP.. having trouble getting used to it.   EKG showed sinus tach HR 112 Troponin negative x2, D-dimer negative, x-ray unremarkable.  CBC normal except wbc 12.0  12/22 video visit with pulmonary NP Tammy Parrett.  Recommended nasal saline BID, nasal gel at bedtime.  Plans to try a different mask.   Now in last 24 hours he has been having less SOB. He almost cancelled the appt.  Nasal saline and gel.. started may have helped a little. Mucinex  DM has helped some.  He has been feeling more bloated, and decrease appetite since  starting CPAp.  He wonders if anxiety may be contributing, as he feels at times like he is suffocating with the mask on.   Not really true cough... has to clear throat/Cough is from dry throat.. no fever, no myalgia.   He does have claustrophobia.   COVID 19 screen No recent travel or known exposure to COVID19 The patient denies respiratory symptoms of COVID 19 at this time.  The importance of social distancing was discussed today.   Review of Systems  Constitutional: Negative for chills and fever.  HENT: Negative for congestion and ear pain.   Eyes: Negative for pain and redness.  Respiratory: Positive for cough and shortness of breath. Negative for hemoptysis, sputum production and wheezing.   Cardiovascular: Negative for chest pain, palpitations and leg swelling.  Gastrointestinal: Negative for abdominal pain, blood in stool, constipation, diarrhea, nausea and vomiting.  Genitourinary: Negative for dysuria.  Musculoskeletal: Negative for falls and myalgias.  Skin: Negative for rash.  Neurological: Negative for dizziness.  Psychiatric/Behavioral: Negative for depression. The patient is not nervous/anxious.       Past Medical History:  Diagnosis Date  . Arthritis   . Hypoglycemic syndrome   . Seizures ([place]) 1990s   isolated, none since, was on dilantin for 3 yrs, then stopped    reports that he has never smoked. He has never used smokeless tobacco. He reports that he does not drink alcohol or use drugs.   Current Outpatient Medications:  .  acetaminophen (TYLENOL) 500 MG tablet, Take 500 mg by mouth every 6 (six) hours as needed., Disp: , Rfl:  .  aspirin 81  MG EC tablet, Take 81 mg by mouth every Monday, Wednesday, and Friday. Swallow whole., Disp: , Rfl:  .  ibuprofen (ADVIL,MOTRIN) 200 MG tablet, Take 200 mg by mouth as needed.  , Disp: , Rfl:  .  Omega-3 Fatty Acids (FISH OIL) 1000 MG CAPS, Take 1-2 capsules (1,000-2,000 mg total) by mouth daily., Disp: , Rfl: 0 .   sildenafil (VIAGRA) 100 MG tablet, TAKE 1/2 TO 1 TABLET BY MOUTH 1 HOUR PRIOR AS NEEDED, Disp: 9 tablet, Rfl: 6   Observations/Objective: Temperature 98.6 F (37 C), temperature source Tympanic, height 5' 5.5" (1.664 m), weight 208 lb (94.3 kg).  Physical Exam  Physical Exam Constitutional:      General: The patient is not in acute distress. speaking in complete sentences Pulmonary:     Effort: Pulmonary effort is normal. No respiratory distress.  Neurological:     Mental Status: The patient is alert and oriented to person, place, and time.  Psychiatric:        Mood and Affect: Mood normal.        Behavior: Behavior normal.   Assessment and Plan    I discussed the assessment and treatment plan with the patient. The patient was provided an opportunity to ask questions and all were answered. The patient agreed with the plan and demonstrated an understanding of the instructions.   The patient was advised to call back or seek an in-person evaluation if the symptoms worsen or if the condition fails to improve as anticipated.  Sleep apnea  Changes made by pulmonary may be improving his tolerance of CPAP some. Doing better, wearing CPAP longer but not all night still.,  SOB (shortness of breath) Improved today. Better with CPAP adjustment, mucolytic, nasal saline and nsasl gel as well as lower pressure and higher humidity setting on CPAP.  Neg cardiac work up in ER, no infectious symptoms.  Started with CPAP... may be anxiety associated. Could consider anxiety treatment if not continuing to improve with cahnges made to CPAP.      Eliezer Lofts, MD

## 2019-04-07 ENCOUNTER — Telehealth (INDEPENDENT_AMBULATORY_CARE_PROVIDER_SITE_OTHER): Payer: BC Managed Care – PPO | Admitting: Internal Medicine

## 2019-04-07 ENCOUNTER — Encounter: Payer: Self-pay | Admitting: Internal Medicine

## 2019-04-07 DIAGNOSIS — G4733 Obstructive sleep apnea (adult) (pediatric): Secondary | ICD-10-CM | POA: Diagnosis not present

## 2019-04-07 NOTE — Patient Instructions (Signed)
Continue CPAP as prescribed. 

## 2019-04-07 NOTE — Progress Notes (Signed)
Name: Ricky Barton MRN: [id number] DOB: 1955-07-23     I connected with the patient by telephone enabled telemedicine visit and verified that I am speaking with the correct person using two identifiers.    I discussed the limitations, risks, security and privacy concerns of performing an evaluation and management service by telemedicine and the availability of in-person appointments. I also discussed with the patient that there may be a patient responsible charge related to this service. The patient expressed understanding and agreed to proceed.  PATIENT AGREES AND CONFIRMS -YES   Other persons participating in the visit and their role in the encounter: Patient, nursing  This visit type was conducted due to national recommendations for restrictions regarding the COVID-19 Pandemic (e.g. social distancing).  This format is felt to be most appropriate for this patient at this time.  All issues noted in this document were discussed and addressed.        HST 02/01/19 >> AHI 53.4, SpO2 low 67%  CHIEF COMPLAINT:  Follow up OSA   HISTORY OF PRESENT ILLNESS:  No evidence of heart failure at this time No evidence or signs of infection at this time No respiratory distress No fevers, chills, nausea, vomiting, diarrhea No evidence of lower extremity edema No evidence hemoptysis  HST reveiwed with patient again AHI 53, lowest o2sat 67%  Patient has excellent compliance report 100% for days 100% longer than 4 hours AHI down to 2.4      PAST MEDICAL HISTORY :   has a past medical history of Arthritis, Hypoglycemic syndrome, and Seizures (Ricky Barton) (1990s).  has a past surgical history that includes Cholecystectomy (1989); Head MRI (06/02); Colonoscopy (03/2011); Polypectomy; and Colonoscopy (07/2015). Prior to Admission medications   Medication Sig Start Date End Date Taking? Authorizing Provider  acetaminophen (TYLENOL) 500 MG tablet Take 500 mg by mouth every 6 (six) hours as  needed.   Yes [provider]  aspirin 81 MG EC tablet Take 81 mg by mouth every Monday, Wednesday, and Friday. Swallow whole.   Yes [provider]  ibuprofen (ADVIL,MOTRIN) 200 MG tablet Take 200 mg by mouth as needed.     Yes [provider]  Omega-3 Fatty Acids (FISH OIL) 1000 MG CAPS Take 1-2 capsules (1,000-2,000 mg total) by mouth daily. 11/10/18  Yes Ria Bush, MD  sildenafil (VIAGRA) 100 MG tablet TAKE 1/2 TO 1 TABLET BY MOUTH 1 HOUR PRIOR AS NEEDED 04/05/14  Yes Ria Bush, MD   Allergies  Allergen Reactions  . Piroxicam Rash    blisters    FAMILY HISTORY:  family history includes CAD (age of onset: 48) in his brother; CAD (age of onset: 56) in his brother; Cancer (age of onset: 51) in his father; Colon polyps in his mother; Hyperlipidemia in his brother, brother, brother, and brother; Hypertension in his mother; Multiple sclerosis in his brother; Pancreatic cancer (age of onset: 47) in his brother; Sleep apnea in his brother. SOCIAL HISTORY:  reports that he has never smoked. He has never used smokeless tobacco. He reports that he does not drink alcohol or use drugs.    Review of Systems:  Gen:  Denies  fever, sweats, chills weight loss  HEENT: Denies blurred vision, double vision, ear pain, eye pain, hearing loss, nose bleeds, sore throat Cardiac:  No dizziness, chest pain or heaviness, chest tightness,edema, No JVD Resp:   No cough, -sputum production, -shortness of breath,-wheezing, -hemoptysis,  Gi: Denies swallowing difficulty, stomach pain, nausea or vomiting, diarrhea,  constipation, bowel incontinence Gu:  Denies bladder incontinence, burning urine Ext:   Denies Joint pain, stiffness or swelling Skin: Denies  skin rash, easy bruising or bleeding or hives Endoc:  Denies polyuria, polydipsia , polyphagia or weight change Psych:   Denies depression, insomnia or hallucinations  Other:  All other systems negative   ALL OTHER ROS  ARE NEGATIVE       ASSESSMENT AND PLAN SYNOPSIS  SEVERE OSA AHI down to 2.4 Excellent compliance report Patient uses and benefits from therapy   Obesity -recommend significant weight loss -recommend changing diet  Deconditioned state -Recommend increased daily activity and exercise     COVID-19 EDUCATION: The signs and symptoms of COVID-19 were discussed with the patient and how to seek care for testing.  The importance of social distancing was discussed today. Hand Washing Techniques and avoid touching face was advised.     MEDICATION ADJUSTMENTS/LABS AND TESTS ORDERED: Continue AUTO-CPAP as prescribed   CURRENT MEDICATIONS REVIEWED AT LENGTH WITH PATIENT TODAY   Patient satisfied with Plan of action and management. All questions answered  Follow up in 6 months  Total Time Spent 21 mins  Maretta Bees Patricia Pesa, M.D.  [facility]  Medical Director [place] Director [facility] Cardio-Pulmonary Department

## 2019-04-26 ENCOUNTER — Telehealth: Payer: Self-pay | Admitting: Internal Medicine

## 2019-04-26 NOTE — Telephone Encounter (Signed)
Pt had some questions in regards to his cpap. He has been having a dry cough, when he wears his cpap his right side of chest hurts, has discomfort in shoulder blade, wondering if it's to do with the air pressure on his cpap. He mentioned that air blows in his eyes. He stated that the cough is during the day but the chest pain and shoulder discomfort is all throughout the day. He stated that he has been to the emergency room before for the same issue but he doesn't believe that this is related to his heart.  DK please advise.

## 2019-04-26 NOTE — Telephone Encounter (Signed)
Pt had some questions in regards to his cpap. He has been having a dry cough, when he wears his cpap his right side of chest hurts, has discomfort in shoulder blade, wondering if it's to do with the air pressure on his cpap. Cb#: [phone]

## 2019-04-27 NOTE — Telephone Encounter (Signed)
Pt is aware of below recommendations and voiced his understanding.  Nothing further is needed.  

## 2019-04-27 NOTE — Telephone Encounter (Signed)
Recommend HOLDING CPAP therapy and assess if symptoms persists.  If symptoms persist, patient will need to call back, may need CT chest

## 2019-05-02 ENCOUNTER — Telehealth: Payer: Self-pay | Admitting: Internal Medicine

## 2019-05-02 NOTE — Telephone Encounter (Signed)
IF HIS CPAP MACHINE IS CAUSING ISSUES, HE MAY NOT BE ABLE TO WEAR IT AGAIN. PATIENT MAY NEED DENTAL REFERRAL FOR ORAL DEVICE FOR SNORING([facility]) IS WHO THEY NEED TO CALL AND GET ASSESSED

## 2019-05-02 NOTE — Telephone Encounter (Signed)
Pt stated he has been feeling a little better since being off of the CPAP machine for the last 4-5 days. He states the right sided pain, persistent cough, and sharp right shoulder pain is still there but has eased up. His breathing is better unless he overexerts himself. He has been taking Prilosec and mucinex in hopes that they will help resolve this issue. The mucinex has helped with his cough some but its not as bad. His wife stated he is now snoring again without the CPAP. Pt would like to know what do you recommend him to do now. Please advise.

## 2019-05-03 NOTE — Telephone Encounter (Signed)
Called pt to let him know of DKs recommendations. He wasn't sure that this was a good idea to stop the CPAP. He stated that the CPAP was helping. Now he is saying that the shoulder pain could possibly be from him pulling a muscle because he helped someone move a few things last weekend. He has been doing some exercises his wife told him to do and taking motrin for the pain and he says that has helped relieve the pain. He stated thay the last time this issue occurred he believes it was anxiety. I recommended that he speak with his PCP about the anxiety. He asked if he could make an appointment to see DK in office and I explained to him that DK was only in office one week out of the month and this month was already completed. He asked if any other drs here could see him and I explained that DK was the only dr to see pts with sleep apnea. I recommended he go to the [facility] office if he wanted to be seen sooner as they have more providers who can see him.  He wants to talk this over with his wife and he will reach back out.

## 2019-05-08 NOTE — Telephone Encounter (Signed)
Called to follow up and the pt said he would like to decline the dental referral. He started using the CPAP machine again on 05/03/2019 and stated that it is working well. Nothing further is needed.

## 2019-08-07 ENCOUNTER — Encounter: Payer: Self-pay | Admitting: Family Medicine

## 2019-11-05 ENCOUNTER — Other Ambulatory Visit: Payer: Self-pay | Admitting: Family Medicine

## 2019-11-05 DIAGNOSIS — Z125 Encounter for screening for malignant neoplasm of prostate: Secondary | ICD-10-CM

## 2019-11-05 DIAGNOSIS — E785 Hyperlipidemia, unspecified: Secondary | ICD-10-CM

## 2019-11-05 DIAGNOSIS — R739 Hyperglycemia, unspecified: Secondary | ICD-10-CM

## 2019-11-07 ENCOUNTER — Other Ambulatory Visit: Payer: Self-pay

## 2019-11-07 ENCOUNTER — Other Ambulatory Visit (INDEPENDENT_AMBULATORY_CARE_PROVIDER_SITE_OTHER): Payer: BC Managed Care – PPO

## 2019-11-07 DIAGNOSIS — R739 Hyperglycemia, unspecified: Secondary | ICD-10-CM

## 2019-11-07 DIAGNOSIS — E785 Hyperlipidemia, unspecified: Secondary | ICD-10-CM

## 2019-11-07 DIAGNOSIS — Z125 Encounter for screening for malignant neoplasm of prostate: Secondary | ICD-10-CM

## 2019-11-07 LAB — COMPREHENSIVE METABOLIC PANEL
ALT: 18 U/L (ref 0–53)
AST: 17 U/L (ref 0–37)
Albumin: 4.1 g/dL (ref 3.5–5.2)
Alkaline Phosphatase: 68 U/L (ref 39–117)
BUN: 16 mg/dL (ref 6–23)
CO2: 28 mEq/L (ref 19–32)
Calcium: 9.2 mg/dL (ref 8.4–10.5)
Chloride: 103 mEq/L (ref 96–112)
Creatinine, Ser: 0.78 mg/dL (ref 0.40–1.50)
GFR: 100.11 mL/min (ref >60.00)
Glucose, Bld: 100 mg/dL — ABNORMAL HIGH (ref 70–99)
Potassium: 4.4 mEq/L (ref 3.5–5.1)
Sodium: 139 mEq/L (ref 135–145)
Total Bilirubin: 0.6 mg/dL (ref 0.2–1.2)
Total Protein: 7.1 g/dL (ref 6.0–8.3)

## 2019-11-07 LAB — HEMOGLOBIN A1C: Hgb A1c MFr Bld: 5.3 % (ref 4.6–6.5)

## 2019-11-07 LAB — LIPID PANEL
Cholesterol: 173 mg/dL (ref 0–200)
HDL: 42.4 mg/dL (ref >39.00)
LDL Cholesterol: 97 mg/dL (ref 0–99)
NonHDL: 130.59
Total CHOL/HDL Ratio: 4
Triglycerides: 169 mg/dL — ABNORMAL HIGH (ref 0.0–149.0)
VLDL: 33.8 mg/dL (ref 0.0–40.0)

## 2019-11-07 LAB — PSA: PSA: 0.34 ng/mL (ref 0.10–4.00)

## 2019-11-14 ENCOUNTER — Other Ambulatory Visit: Payer: Self-pay

## 2019-11-14 ENCOUNTER — Ambulatory Visit (INDEPENDENT_AMBULATORY_CARE_PROVIDER_SITE_OTHER): Payer: BC Managed Care – PPO | Admitting: Family Medicine

## 2019-11-14 ENCOUNTER — Encounter: Payer: Self-pay | Admitting: Family Medicine

## 2019-11-14 VITALS — BP 136/80 | HR 92 | Temp 98.2°F | Ht 65.5 in | Wt 214.5 lb

## 2019-11-14 DIAGNOSIS — M25562 Pain in left knee: Secondary | ICD-10-CM

## 2019-11-14 DIAGNOSIS — G4733 Obstructive sleep apnea (adult) (pediatric): Secondary | ICD-10-CM

## 2019-11-14 DIAGNOSIS — E785 Hyperlipidemia, unspecified: Secondary | ICD-10-CM | POA: Diagnosis not present

## 2019-11-14 DIAGNOSIS — Z9989 Dependence on other enabling machines and devices: Secondary | ICD-10-CM

## 2019-11-14 DIAGNOSIS — R739 Hyperglycemia, unspecified: Secondary | ICD-10-CM

## 2019-11-14 DIAGNOSIS — Z0000 Encounter for general adult medical examination without abnormal findings: Secondary | ICD-10-CM

## 2019-11-14 DIAGNOSIS — H9192 Unspecified hearing loss, left ear: Secondary | ICD-10-CM | POA: Diagnosis not present

## 2019-11-14 DIAGNOSIS — R569 Unspecified convulsions: Secondary | ICD-10-CM

## 2019-11-14 DIAGNOSIS — R030 Elevated blood-pressure reading, without diagnosis of hypertension: Secondary | ICD-10-CM

## 2019-11-14 HISTORY — DX: Unspecified hearing loss, left ear: H91.92

## 2019-11-14 MED ORDER — ATORVASTATIN CALCIUM 10 MG PO TABS: 10.0000 mg | ORAL_TABLET | Freq: Every day | ORAL | 11 refills | Status: DC

## 2019-11-14 MED ORDER — FISH OIL 1000 MG PO CAPS: 1.0000 | ORAL_CAPSULE | Freq: Every day | ORAL | 0 refills | Status: DC

## 2019-11-14 NOTE — Assessment & Plan Note (Signed)
BP stable today.

## 2019-11-14 NOTE — Assessment & Plan Note (Signed)
Remote. No recent seizures off AED.

## 2019-11-14 NOTE — Assessment & Plan Note (Signed)
Ongoing, anticipate OA related. He may see Dr Lorelei Pont sports medicine if needed.

## 2019-11-14 NOTE — Assessment & Plan Note (Signed)
Preventative protocols reviewed and updated unless pt declined. Discussed healthy diet and lifestyle.  

## 2019-11-14 NOTE — Assessment & Plan Note (Signed)
Chronic, stable off statin. Only on low dose fish oil. Encouraged healthy diet and lifestyle to control cholesterol.  The 10-year ASCVD risk score [name] DC Brooke Bonito., et al., 2013) is: 13%   Values used to calculate the score:     Age: 64 years     Sex: Male     Is Non-Hispanic African American: No     Diabetic: No     Tobacco smoker: No     Systolic Blood Pressure: 240 mmHg     Is BP treated: No     HDL Cholesterol: 42.4 mg/dL     Total Cholesterol: 173 mg/dL

## 2019-11-14 NOTE — Assessment & Plan Note (Signed)
Encouraged healthy diet and lifestyle changes to affect sustainable weight loss.  

## 2019-11-14 NOTE — Assessment & Plan Note (Signed)
Recent A1c normal.  

## 2019-11-14 NOTE — Progress Notes (Signed)
This visit was conducted in person.  BP 136/80 (BP Location: Left Arm, Patient Position: Sitting, Cuff Size: Large)   Pulse 92   Temp 98.2 F (36.8 C) (Temporal)   Ht 5' 5.5" (1.664 m)   Wt 214 lb 8 oz (97.3 kg)   SpO2 96%   BMI 35.15 kg/m    CC: CPE Subjective:    Patient ID: Ricky Barton, male    DOB: 1955/11/05, 64 y.o.   MRN: [id number]  HPI: Ricky Barton is a 64 y.o. male presenting on 11/14/2019 for Annual Exam (Pt mentioned he recently lost his mom.)   Mother passed away last month after stroke. Stayed in hospice home for 4 wks.   OSA - doing remarkably well on CPAP, currently using nasal pillow, setting of 5-15.  Initially struggle to get used to CPAP due to claustrophobia. HST 01/2019 - AHI 53.4, SpO2 down to 67%.   Deaf in left ear   Ongoing bilateral knee pain.   Preventative: COLONOSCOPY 03/2011-3 adenomatous polyps, rec rpt 5 yrs (Dr Fuller Plan)  Colonoscopy5/2017-WNL, rpt 5 yrs Fuller Plan)  Prostate cancer screening - continue PSA yearly Lung cancer screening - not eligible  Flu shotyearly Td 2010, Tdap 05/2017 COVID vaccine - [place] 05/2019, 06/2019 Shingrix - completed 11/2017, 02/2018 Advanced directive planning - does not have set up. Seat belt use discussed Sunscreen use discussed. No changing moles on skin. Non smoker Alcohol - none Dentist - Q61mo  Eye exam - yearly   Caffeine: occasional coffee/tea  Married and lives with wife, grown children, dogs Occupation: [employer]  Activity: golfing, walks dogsat least 1 mile, joined Y Diet: good water, fruits/vegetables daily     Relevant past medical, surgical, family and social history reviewed and updated as indicated. Interim medical history since our last visit reviewed. Allergies and medications reviewed and updated. Outpatient Medications Prior to Visit  Medication Sig Dispense Refill  . acetaminophen (TYLENOL) 500 MG tablet Take 500 mg by mouth every 6 (six) hours as needed.     Marland Kitchen aspirin 81 MG EC tablet Take 81 mg by mouth every Monday, Wednesday, and Friday. Swallow whole.    . ibuprofen (ADVIL,MOTRIN) 200 MG tablet Take 200 mg by mouth as needed.      . sildenafil (VIAGRA) 100 MG tablet TAKE 1/2 TO 1 TABLET BY MOUTH 1 HOUR PRIOR AS NEEDED 9 tablet 6  . Omega-3 Fatty Acids (FISH OIL) 1000 MG CAPS Take 1-2 capsules (1,000-2,000 mg total) by mouth daily.  0   No facility-administered medications prior to visit.     Per HPI unless specifically indicated in ROS section below Review of Systems  Constitutional: Negative for activity change, appetite change, chills, fatigue, fever and unexpected weight change.  HENT: Negative for hearing loss.   Eyes: Negative for visual disturbance.  Respiratory: Negative for cough, chest tightness, shortness of breath and wheezing.   Cardiovascular: Negative for chest pain, palpitations and leg swelling.  Gastrointestinal: Positive for abdominal pain (intermittent mild R lower abd discomfort ). Negative for abdominal distention, blood in stool, constipation, diarrhea, nausea and vomiting.  Genitourinary: Negative for difficulty urinating and hematuria.  Musculoskeletal: Negative for arthralgias, myalgias and neck pain.  Skin: Negative for rash.  Neurological: Negative for dizziness, seizures, syncope and headaches.  Hematological: Negative for adenopathy. Does not bruise/bleed easily.  Psychiatric/Behavioral: Negative for dysphoric mood. The patient is not nervous/anxious.    Objective:  BP 136/80 (BP Location: Left Arm, Patient Position: Sitting, Cuff Size: Large)  Pulse 92   Temp 98.2 F (36.8 C) (Temporal)   Ht 5' 5.5" (1.664 m)   Wt 214 lb 8 oz (97.3 kg)   SpO2 96%   BMI 35.15 kg/m   Wt Readings from Last 3 Encounters:  11/14/19 214 lb 8 oz (97.3 kg)  03/14/19 208 lb (94.3 kg)  03/06/19 215 lb (97.5 kg)      Physical Exam Vitals and nursing note reviewed.  Constitutional:      General: He is not in acute  distress.    Appearance: Normal appearance. He is well-developed. He is not ill-appearing.  HENT:     Head: Normocephalic and atraumatic.     Right Ear: Hearing, tympanic membrane, ear canal and external ear normal.     Left Ear: Hearing, tympanic membrane, ear canal and external ear normal.     Mouth/Throat:     Pharynx: Uvula midline.  Eyes:     General: No scleral icterus.    Extraocular Movements: Extraocular movements intact.     Conjunctiva/sclera: Conjunctivae normal.     Pupils: Pupils are equal, round, and reactive to light.  Cardiovascular:     Rate and Rhythm: Normal rate and regular rhythm.     Pulses: Normal pulses.          Radial pulses are 2+ on the right side and 2+ on the left side.     Heart sounds: Normal heart sounds. No murmur heard.   Pulmonary:     Effort: Pulmonary effort is normal. No respiratory distress.     Breath sounds: Normal breath sounds. No wheezing, rhonchi or rales.  Abdominal:     General: Abdomen is flat. Bowel sounds are normal. There is no distension.     Palpations: Abdomen is soft. There is no mass.     Tenderness: There is no abdominal tenderness. There is no guarding or rebound.     Hernia: No hernia is present.  Musculoskeletal:        General: Normal range of motion.     Cervical back: Normal range of motion and neck supple.     Right lower leg: No edema.     Left lower leg: No edema.  Lymphadenopathy:     Cervical: No cervical adenopathy.  Skin:    General: Skin is warm and dry.     Findings: No rash.  Neurological:     General: No focal deficit present.     Mental Status: He is alert and oriented to person, place, and time.     Comments: CN grossly intact, station and gait intact  Psychiatric:        Mood and Affect: Mood normal.        Behavior: Behavior normal.        Thought Content: Thought content normal.        Judgment: Judgment normal.       Results for orders placed or performed in visit on 11/07/19  PSA    Result Value Ref Range   PSA 0.34 0.10 - 4.00 ng/mL  Hemoglobin A1c  Result Value Ref Range   Hgb A1c MFr Bld 5.3 4.6 - 6.5 %  Lipid panel  Result Value Ref Range   Cholesterol 173 0 - 200 mg/dL   Triglycerides 169.0 (H) 0 - 149 mg/dL   HDL 42.40 >39.00 mg/dL   VLDL 33.8 0.0 - 40.0 mg/dL   LDL Cholesterol 97 0 - 99 mg/dL   Total CHOL/HDL Ratio 4  NonHDL 130.59   Comprehensive metabolic panel  Result Value Ref Range   Sodium 139 135 - 145 mEq/L   Potassium 4.4 3.5 - 5.1 mEq/L   Chloride 103 96 - 112 mEq/L   CO2 28 19 - 32 mEq/L   Glucose, Bld 100 (H) 70 - 99 mg/dL   BUN 16 6 - 23 mg/dL   Creatinine, Ser 0.78 0.40 - 1.50 mg/dL   Total Bilirubin 0.6 0.2 - 1.2 mg/dL   Alkaline Phosphatase 68 39 - 117 U/L   AST 17 0 - 37 U/L   ALT 18 0 - 53 U/L   Total Protein 7.1 6.0 - 8.3 g/dL   Albumin 4.1 3.5 - 5.2 g/dL   GFR 100.11 >60.00 mL/min   Calcium 9.2 8.4 - 10.5 mg/dL   Assessment & Plan:  This visit occurred during the SARS-CoV-2 public health emergency.  Safety protocols were in place, including screening questions prior to the visit, additional usage of staff PPE, and extensive cleaning of exam room while observing appropriate contact time as indicated for disinfecting solutions.   Problem List Items Addressed This Visit    Severe obesity (BMI 35.0-39.9) with comorbidity ([place])    Encouraged healthy diet and lifestyle changes to affect sustainable weight loss       Seizures (HCC)    Remote. No recent seizures off AED.       OSA on CPAP    Great control on regular CPAP at a setting of 5-15. Appreciate pulm care. Continue.       Left medial knee pain    Ongoing, anticipate OA related. He may see Dr Lorelei Pont sports medicine if needed.       Left ear hearing loss   Hyperglycemia    Recent A1c normal.       HLD (hyperlipidemia)    Chronic, stable off statin. Only on low dose fish oil. Encouraged healthy diet and lifestyle to control cholesterol.  The 10-year ASCVD  risk score [name] DC Brooke Bonito., et al., 2013) is: 13%   Values used to calculate the score:     Age: 20 years     Sex: Male     Is Non-Hispanic African American: No     Diabetic: No     Tobacco smoker: No     Systolic Blood Pressure: 559 mmHg     Is BP treated: No     HDL Cholesterol: 42.4 mg/dL     Total Cholesterol: 173 mg/dL       Relevant Medications   atorvastatin (LIPITOR) 10 MG tablet   Healthcare maintenance - Primary    Preventative protocols reviewed and updated unless pt declined. Discussed healthy diet and lifestyle.       Elevated blood pressure reading without diagnosis of hypertension    BP stable today.           Meds ordered this encounter  Medications  . atorvastatin (LIPITOR) 10 MG tablet    Sig: Take 1 tablet (10 mg total) by mouth daily.    Dispense:  30 tablet    Refill:  11  . Omega-3 Fatty Acids (FISH OIL) 1000 MG CAPS    Sig: Take 1-2 capsules (1,000-2,000 mg total) by mouth daily.    Refill:  0   No orders of the defined types were placed in this encounter.   Patient instructions: Trial low dose atorvastatin (lipitor) 10mg  daily to help lower cardiovascular risk. If any trouble with muscle aches, stop medicine and try once  or twice weekly  If interested, you could see our sports medicine doctor Dr Lorelei Pont for knees. You are doing well today Return as needed or in 1 year for next physical.   Follow up plan: Return in about 1 year (around 11/13/2020) for annual exam, prior fasting for blood work.  Ria Bush, MD

## 2019-11-14 NOTE — Assessment & Plan Note (Addendum)
Great control on regular CPAP at a setting of 5-15. Appreciate pulm care. Continue.

## 2019-11-14 NOTE — Patient Instructions (Addendum)
Trial low dose atorvastatin (lipitor) 10mg  daily to help lower cardiovascular risk. If any trouble with muscle aches, stop medicine and try once or twice weekly  You are doing well today Return as needed or in 1 year for next physical.  If interested, you could see our sports medicine doctor Dr Lorelei Pont for knees.  Health Maintenance, Male Adopting a healthy lifestyle and getting preventive care are important in promoting health and wellness. Ask your health care provider about:  The right schedule for you to have regular tests and exams.  Things you can do on your own to prevent diseases and keep yourself healthy. What should I know about diet, weight, and exercise? Eat a healthy diet   Eat a diet that includes plenty of vegetables, fruits, low-fat dairy products, and lean protein.  Do not eat a lot of foods that are high in solid fats, added sugars, or sodium. Maintain a healthy weight Body mass index (BMI) is a measurement that can be used to identify possible weight problems. It estimates body fat based on height and weight. Your health care provider can help determine your BMI and help you achieve or maintain a healthy weight. Get regular exercise Get regular exercise. This is one of the most important things you can do for your health. Most adults should:  Exercise for at least 150 minutes each week. The exercise should increase your heart rate and make you sweat (moderate-intensity exercise).  Do strengthening exercises at least twice a week. This is in addition to the moderate-intensity exercise.  Spend less time sitting. Even light physical activity can be beneficial. Watch cholesterol and blood lipids Have your blood tested for lipids and cholesterol at 64 years of age, then have this test every 5 years. You may need to have your cholesterol levels checked more often if:  Your lipid or cholesterol levels are high.  You are older than 64 years of age.  You are at high risk  for heart disease. What should I know about cancer screening? Many types of cancers can be detected early and may often be prevented. Depending on your health history and family history, you may need to have cancer screening at various ages. This may include screening for:  Colorectal cancer.  Prostate cancer.  Skin cancer.  Lung cancer. What should I know about heart disease, diabetes, and high blood pressure? Blood pressure and heart disease  High blood pressure causes heart disease and increases the risk of stroke. This is more likely to develop in people who have high blood pressure readings, are of African descent, or are overweight.  Talk with your health care provider about your target blood pressure readings.  Have your blood pressure checked: ? Every 3-5 years if you are 54-31 years of age. ? Every year if you are 44 years old or older.  If you are between the ages of 52 and 7 and are a current or former smoker, ask your health care provider if you should have a one-time screening for abdominal aortic aneurysm (AAA). Diabetes Have regular diabetes screenings. This checks your fasting blood sugar level. Have the screening done:  Once every three years after age 64 if you are at a normal weight and have a low risk for diabetes.  More often and at a younger age if you are overweight or have a high risk for diabetes. What should I know about preventing infection? Hepatitis B If you have a higher risk for hepatitis B, you should  be screened for this virus. Talk with your health care provider to find out if you are at risk for hepatitis B infection. Hepatitis C Blood testing is recommended for:  Everyone born from [birth year] through 1965.  Anyone with known risk factors for hepatitis C. Sexually transmitted infections (STIs)  You should be screened each year for STIs, including gonorrhea and chlamydia, if: ? You are sexually active and are younger than 64 years of age. ? You  are older than 64 years of age and your health care provider tells you that you are at risk for this type of infection. ? Your sexual activity has changed since you were last screened, and you are at increased risk for chlamydia or gonorrhea. Ask your health care provider if you are at risk.  Ask your health care provider about whether you are at high risk for HIV. Your health care provider may recommend a prescription medicine to help prevent HIV infection. If you choose to take medicine to prevent HIV, you should first get tested for HIV. You should then be tested every 3 months for as long as you are taking the medicine. Follow these instructions at home: Lifestyle  Do not use any products that contain nicotine or tobacco, such as cigarettes, e-cigarettes, and chewing tobacco. If you need help quitting, ask your health care provider.  Do not use street drugs.  Do not share needles.  Ask your health care provider for help if you need support or information about quitting drugs. Alcohol use  Do not drink alcohol if your health care provider tells you not to drink.  If you drink alcohol: ? Limit how much you have to 0-2 drinks a day. ? Be aware of how much alcohol is in your drink. In the [place] one drink equals one 12 oz bottle of beer (355 mL), one 5 oz glass of wine (148 mL), or one 1 oz glass of hard liquor (44 mL). General instructions  Schedule regular health, dental, and eye exams.  Stay current with your vaccines.  Tell your health care provider if: ? You often feel depressed. ? You have ever been abused or do not feel safe at home. Summary  Adopting a healthy lifestyle and getting preventive care are important in promoting health and wellness.  Follow your health care provider's instructions about healthy diet, exercising, and getting tested or screened for diseases.  Follow your health care provider's instructions on monitoring your cholesterol and blood pressure. This  information is not intended to replace advice given to you by your health care provider. Make sure you discuss any questions you have with your health care provider. Document Revised: 02/23/2018 Document Reviewed: 02/23/2018 Elsevier Patient Education  2020 [employer].

## 2019-12-12 ENCOUNTER — Ambulatory Visit: Payer: BC Managed Care – PPO | Admitting: Internal Medicine

## 2019-12-12 ENCOUNTER — Other Ambulatory Visit: Payer: Self-pay

## 2019-12-12 ENCOUNTER — Encounter: Payer: Self-pay | Admitting: Internal Medicine

## 2019-12-12 VITALS — BP 136/90 | HR 88 | Temp 98.9°F | Ht 66.0 in | Wt 217.6 lb

## 2019-12-12 DIAGNOSIS — J309 Allergic rhinitis, unspecified: Secondary | ICD-10-CM | POA: Diagnosis not present

## 2019-12-12 DIAGNOSIS — G4733 Obstructive sleep apnea (adult) (pediatric): Secondary | ICD-10-CM

## 2019-12-12 MED ORDER — CETIRIZINE HCL 10 MG PO TABS: 10.0000 mg | ORAL_TABLET | Freq: Every day | ORAL | 6 refills | Status: DC

## 2019-12-12 NOTE — Patient Instructions (Signed)
Continue  AUTOCPAP 5-20 cm h20 Zyrtec as needed

## 2019-12-12 NOTE — Progress Notes (Signed)
Name: Ricky Barton MRN: [id number] DOB: 09/03/1955    ESTABLISHED CARE FOR OSA  HST 02/01/19 >> AHI 53.4, SpO2 low 67%  CHIEF COMPLAINT: follow up OSA   HISTORY OF PRESENT ILLNESS:   No evidence of heart failure at this time No evidence or signs of infection at this time No respiratory distress No fevers, chills, nausea, vomiting, diarrhea No evidence of lower extremity edema No evidence hemoptysis  HST results discussed with patient  Compliance report 10/2019 100% compliance for days 100% compliance for greater than 4 hours auto CPAP 5 to 15 cm of water pressure AHI 0.4 Patient has excellent report in control of obstructive sleep apnea  +allerghic rhinitis Relieved by mucinex Can start zyrtec   PAST MEDICAL HISTORY :   has a past medical history of Arthritis, Hypoglycemic syndrome, 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  has a past surgical history that includes Cholecystectomy (1989); Head MRI (06/02); Colonoscopy (03/2011); Polypectomy; and Colonoscopy (07/2015). Prior to Admission medications   Medication Sig Start Date End Date Taking? Authorizing Provider  acetaminophen (TYLENOL) 500 MG tablet Take 500 mg by mouth every 6 (six) hours as needed.   Yes [provider]  aspirin 81 MG EC tablet Take 81 mg by mouth every Monday, Wednesday, and Friday. Swallow whole.   Yes [provider]  ibuprofen (ADVIL,MOTRIN) 200 MG tablet Take 200 mg by mouth as needed.     Yes [provider]  Omega-3 Fatty Acids (FISH OIL) 1000 MG CAPS Take 1-2 capsules (1,000-2,000 mg total) by mouth daily. 11/10/18  Yes Ria Bush, MD  sildenafil (VIAGRA) 100 MG tablet TAKE 1/2 TO 1 TABLET BY MOUTH 1 HOUR PRIOR AS NEEDED 04/05/14  Yes Ria Bush, MD   Allergies  Allergen Reactions  . Piroxicam Rash    blisters    FAMILY HISTORY:  family history includes CAD (age of onset: 48) in his brother; CAD (age of onset: 33) in his  brother; Cancer (age of onset: 56) in his father; Colon polyps in his mother; Hyperlipidemia in his brother, brother, brother, and brother; Hypertension in his mother; Multiple sclerosis in his brother; Pancreatic cancer (age of onset: 73) in his brother; Sleep apnea in his brother. SOCIAL HISTORY:  reports that he has never smoked. He has never used smokeless tobacco. He reports that he does not drink alcohol and does not use drugs.     Review of Systems:  Gen:  Denies  fever, sweats, chills weight loss  HEENT: Denies blurred vision, double vision, ear pain, eye pain, hearing loss, nose bleeds, sore throat Cardiac:  No dizziness, chest pain or heaviness, chest tightness,edema, No JVD Resp:   No cough, -sputum production, -shortness of breath,-wheezing, -hemoptysis,  Gi: Denies swallowing difficulty, stomach pain, nausea or vomiting, diarrhea, constipation, bowel incontinence Gu:  Denies bladder incontinence, burning urine Ext:   Denies Joint pain, stiffness or swelling Skin: Denies  skin rash, easy bruising or bleeding or hives Endoc:  Denies polyuria, polydipsia , polyphagia or weight change Psych:   Denies depression, insomnia or hallucinations  Other:  All other systems negative   ALL OTHER ROS ARE NEGATIVE   Physical Examination:   General Appearance: No distress  Neuro:without focal findings,  speech normal,  HEENT: PERRLA, EOM intact.   Pulmonary: normal breath sounds, No wheezing.  CardiovascularNormal S1,S2.  No m/r/g.   Abdomen: Benign, Soft, non-tender. Renal:  No costovertebral tenderness  GU:  Not performed at this time. Endoc: No  evident thyromegaly Skin:   warm, no rashes, no ecchymosis  Extremities: normal, no cyanosis, clubbing. PSYCHIATRIC: Mood, affect within normal limits.   ALL OTHER ROS ARE NEGATIVE     ASSESSMENT AND PLAN SYNOPSIS  Severe OSA Patient with excellent compliance report Continue auto CPAP as prescribed Patient uses and benefits  from auto CPAP therapy  Obesity -recommend significant weight loss -recommend changing diet  Deconditioned state -Recommend increased daily activity and exercise       COVID-19 EDUCATION: The signs and symptoms of COVID-19 were discussed with the patient and how to seek care for testing.  The importance of social distancing was discussed today. Hand Washing Techniques and avoid touching face was advised.     MEDICATION ADJUSTMENTS/LABS AND TESTS ORDERED: Continue  AUTOCPAP 5-20 cm h20 Zyrtec as needed  CURRENT MEDICATIONS REVIEWED AT LENGTH WITH PATIENT TODAY   Patient satisfied with Plan of action and management. All questions answered  Follow up in 1 year   Total time spent 20 minutes  Maretta Bees Patricia Pesa, M.D.  [facility]  Medical Director [place] Director [facility] Cardio-Pulmonary Department

## 2020-02-20 IMAGING — CT CT ABD-PELV W/ CM
2 of 5 series · 14 of 46 positions shown, 16 images · IV contrast (iopamidol)
Comparison: None.

CLINICAL DATA: Right lower quadrant pain, nausea and vomiting.
Concern for appendicitis. Prior cholecystectomy.

EXAM:
CT ABDOMEN AND PELVIS WITH CONTRAST
TECHNIQUE: Multidetector CT imaging of the abdomen and pelvis was performed
using the standard protocol following bolus administration of
intravenous contrast.
CONTRAST:  125mL [id number] IOPAMIDOL ([id number]) INJECTION 61%
Creatinine was obtained on site at [HOSPITAL] at [REDACTED].
Results: Creatinine 0.8 mg/dL.

[Series 2: abd pelvis 5.00 br40 s3 ax · axial · 0.66mm/px · z∈[+1169,+1569]mm · 11 of 90 slices shown, 13 images]
[im 5/90  soft-tissue]
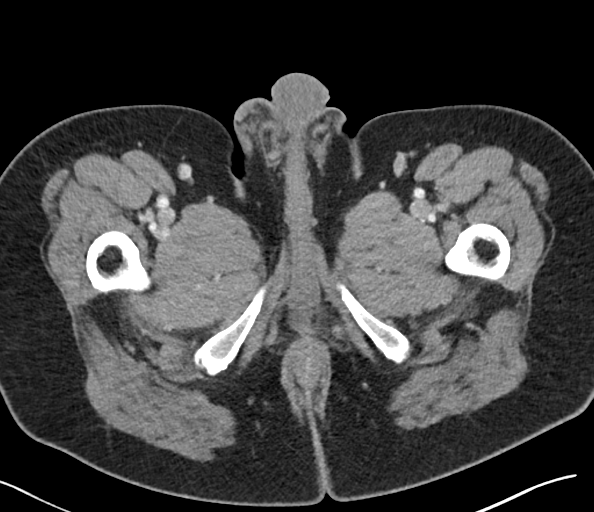
[im 5/90  bone]
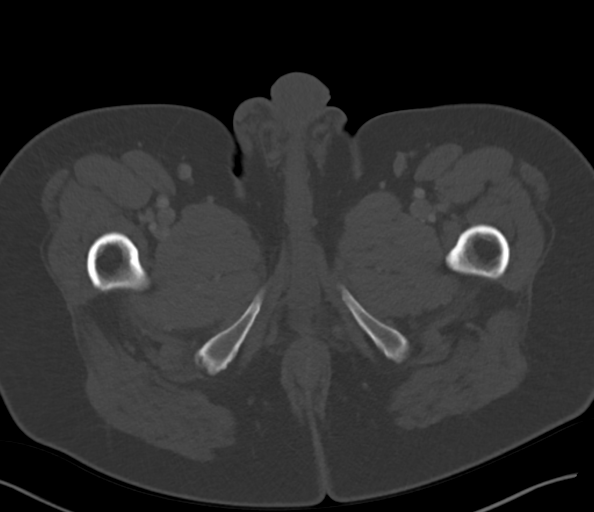
[im 15/90  soft-tissue]
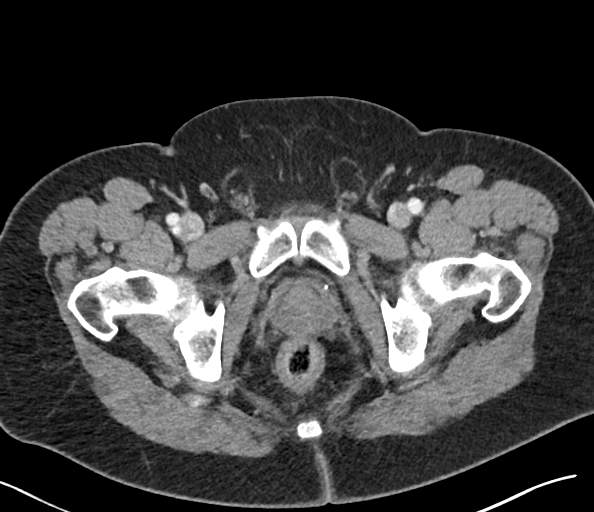
[im 24/90  soft-tissue]
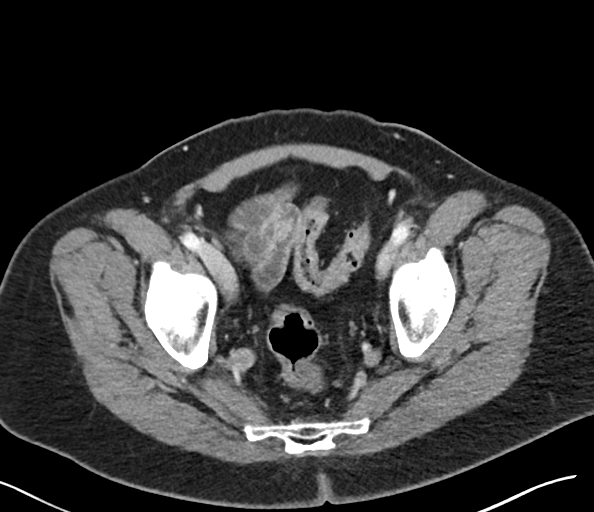
[im 29/90  soft-tissue]
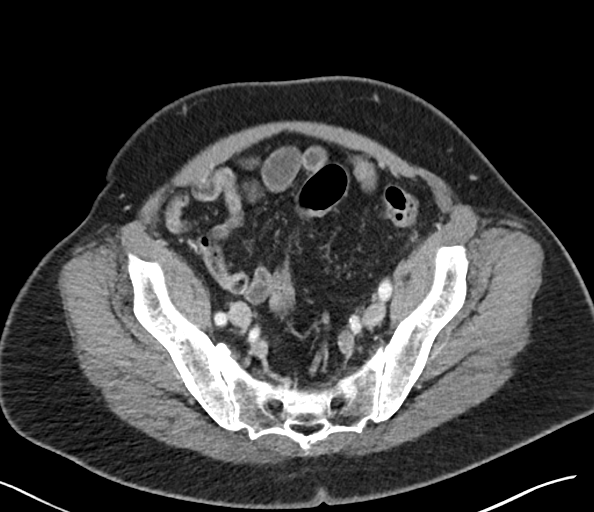
[im 38/90  soft-tissue]
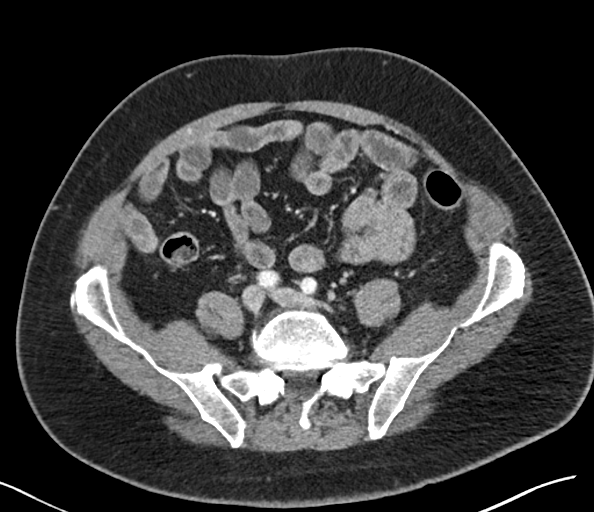
[im 47/90  soft-tissue]
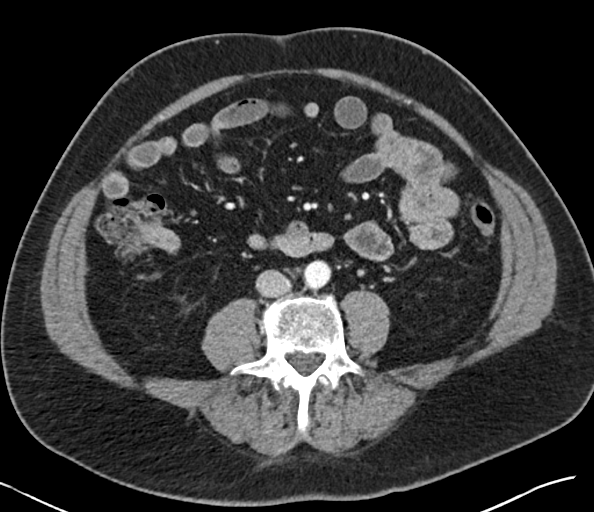
[im 52/90  soft-tissue]
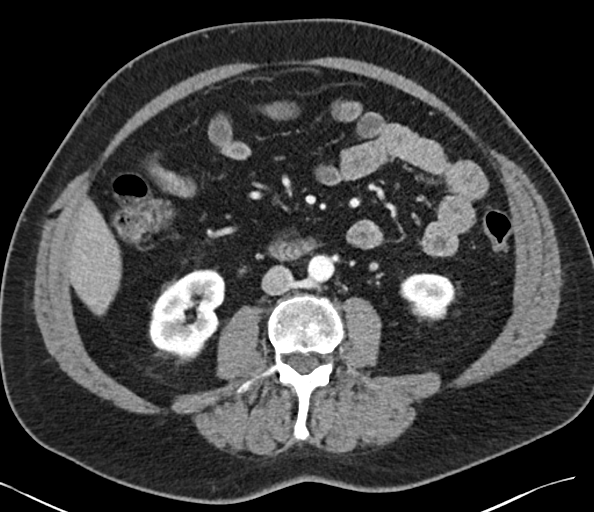
[im 61/90  soft-tissue]
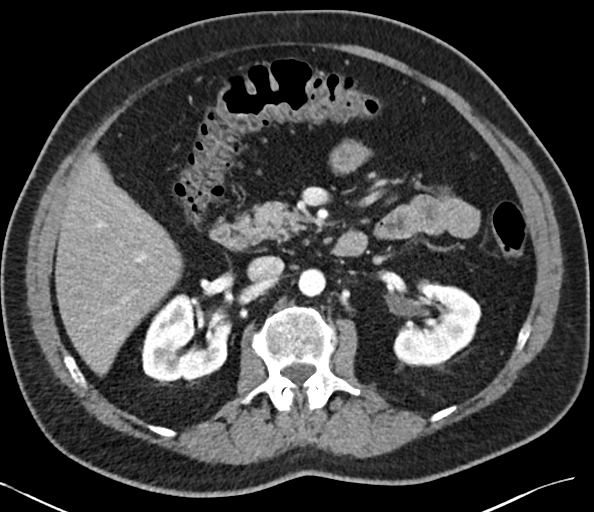
[im 66/90  soft-tissue]
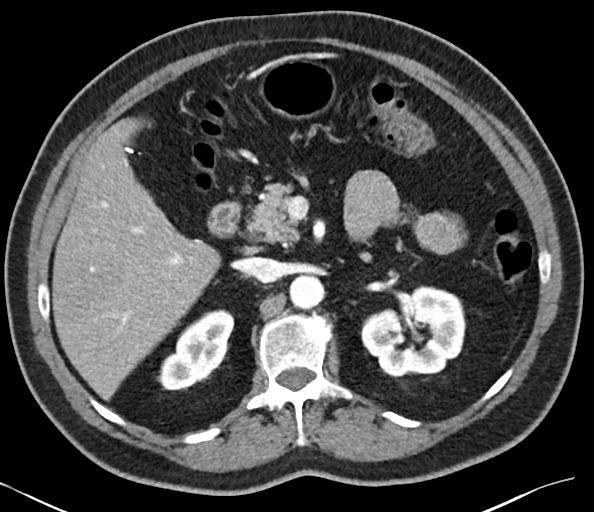
[im 66/90  bone]
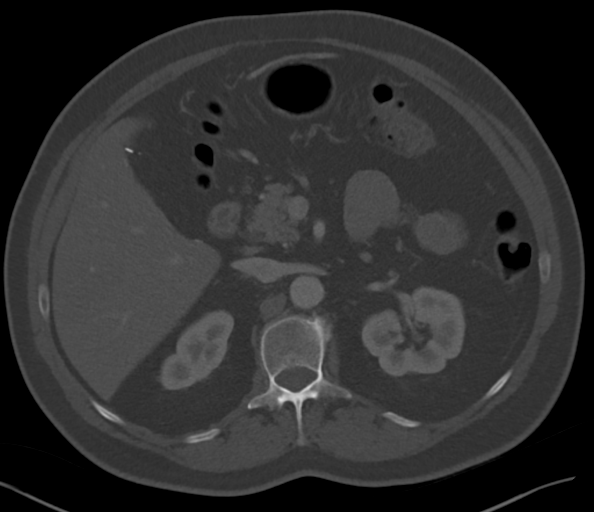
[im 75/90  soft-tissue]
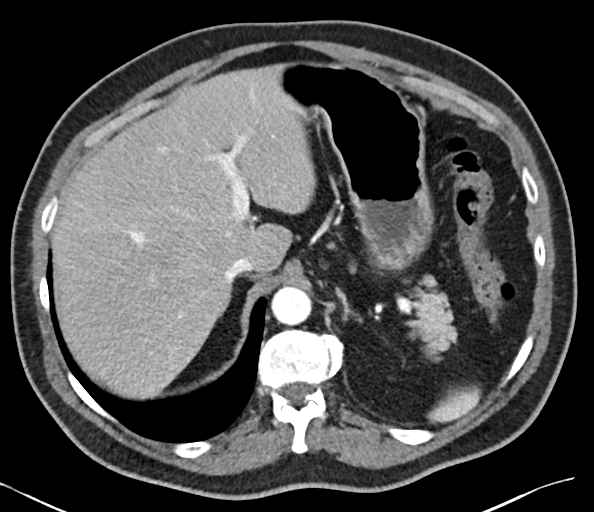
[im 85/90  soft-tissue]
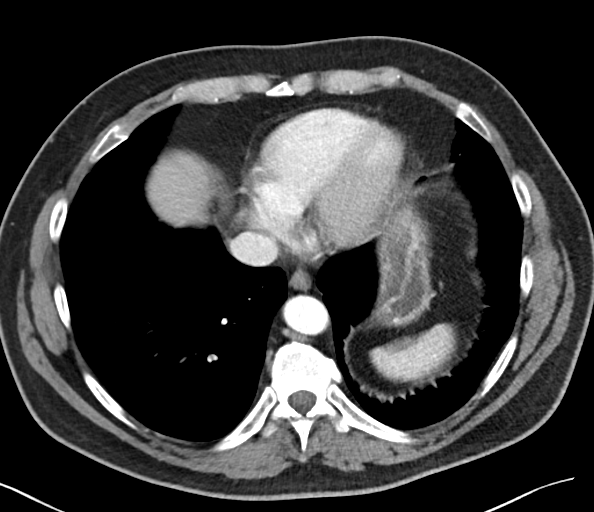

[Series 6: abd pelvis 2.00 br40 s3 cor · coronal · 0.76mm/px · 3 of 167 slices shown]
[im 56/167  soft-tissue]
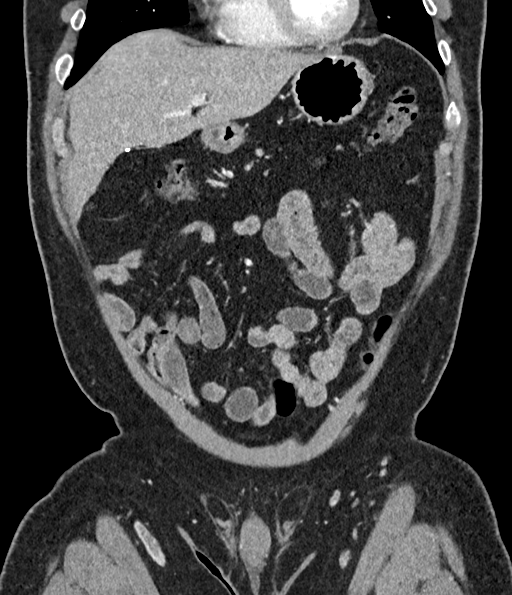
[im 74/167  soft-tissue]
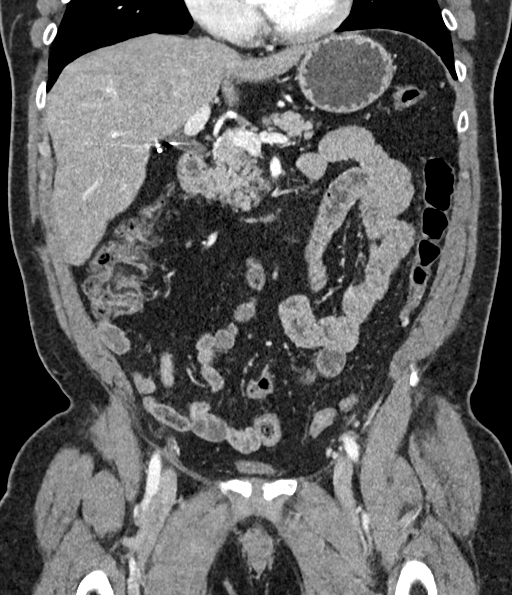
[im 93/167  soft-tissue]
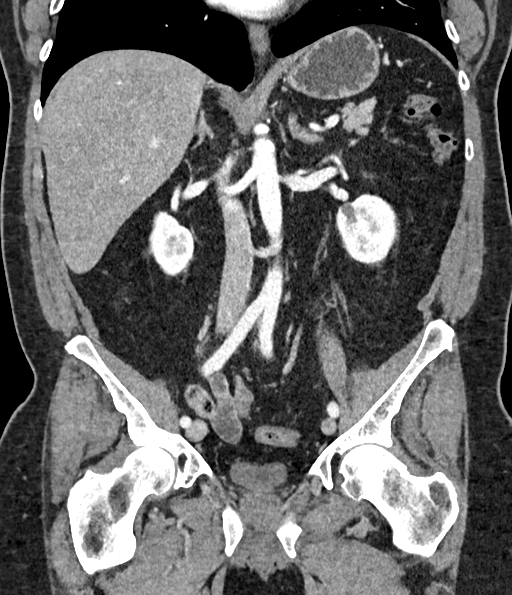

[14 of 46 positions shown; findings below may reference images not displayed]

FINDINGS: Lower chest: Clear lung bases. Normal heart size without pericardial
or pleural effusion.

Hepatobiliary: Normal liver. Cholecystectomy. No biliary duct
dilatation. Subtle hyper attenuation of approximately 3 mm in the
region of the ampulla on image [DATE].

Pancreas: Normal, without mass or ductal dilatation.

Spleen: Normal in size, without focal abnormality.

Adrenals/Urinary Tract: Normal adrenal glands. Too small to
characterize interpolar left renal lesion. Normal right kidney,
without hydronephrosis or hydroureter. Decompressed urinary bladder.

Stomach/Bowel: Normal stomach, without wall thickening. Scattered
colonic diverticula. Normal terminal ileum. Normal appendix,
including on coronal image 92. Normal small bowel.

Vascular/Lymphatic: Aortic atherosclerosis. No abdominopelvic
adenopathy.

Reproductive: Normal prostate.

Other: No significant free fluid. Fat containing abdominal wall
laxity in the periumbilical region.

Musculoskeletal: Degenerative partial fusion of the right sacroiliac
joint. Lumbosacral spondylosis
IMPRESSION: 1. No acute process in the abdomen or pelvis. Normal appendix.
2. Cholecystectomy. Subtle hyperattenuation in the region of the
ampulla is most likely incidental and could represent trace
contrast. A distal common duct stone cannot be excluded. Consider
correlation with bilirubin levels.

Aortic Atherosclerosis ([id number]).

## 2020-07-11 ENCOUNTER — Other Ambulatory Visit: Payer: Self-pay | Admitting: Internal Medicine

## 2020-07-11 DIAGNOSIS — J309 Allergic rhinitis, unspecified: Secondary | ICD-10-CM

## 2020-09-14 ENCOUNTER — Other Ambulatory Visit: Payer: Self-pay | Admitting: Family Medicine

## 2020-09-17 NOTE — Telephone Encounter (Signed)
Per 11/14/19 OV notes, pt is stable off statin.

## 2020-09-20 ENCOUNTER — Other Ambulatory Visit: Payer: Self-pay | Admitting: Family Medicine

## 2020-09-23 NOTE — Telephone Encounter (Signed)
Refilled cholesterol once pt is due for a CPE soon, please call and schedule appt

## 2020-09-24 NOTE — Telephone Encounter (Signed)
Left voice message to call the office  

## 2020-10-25 ENCOUNTER — Telehealth: Payer: Self-pay | Admitting: Internal Medicine

## 2020-10-25 DIAGNOSIS — G4733 Obstructive sleep apnea (adult) (pediatric): Secondary | ICD-10-CM

## 2020-10-28 ENCOUNTER — Telehealth: Payer: Self-pay | Admitting: Nurse Practitioner

## 2020-10-28 ENCOUNTER — Telehealth: Payer: BC Managed Care – PPO | Admitting: Family Medicine

## 2020-10-28 ENCOUNTER — Telehealth: Payer: Self-pay

## 2020-10-28 ENCOUNTER — Telehealth: Payer: Self-pay | Admitting: Family Medicine

## 2020-10-28 ENCOUNTER — Encounter: Payer: Self-pay | Admitting: Nurse Practitioner

## 2020-10-28 DIAGNOSIS — U071 COVID-19: Secondary | ICD-10-CM

## 2020-10-28 MED ORDER — BENZONATATE 100 MG PO CAPS: 100.0000 mg | ORAL_CAPSULE | Freq: Three times a day (TID) | ORAL | 0 refills | Status: DC | PRN

## 2020-10-28 NOTE — Progress Notes (Signed)
Virtual Visit Consent   Ricky Barton, you are scheduled for a virtual visit with a [facility] provider today.     Just as with appointments in the office, your consent must be obtained to participate.  Your consent will be active for this visit and any virtual visit you may have with one of our providers in the next 365 days.     If you have a MyChart account, a copy of this consent can be sent to you electronically.  All virtual visits are billed to your insurance company just like a traditional visit in the office.    As this is a virtual visit, video technology does not allow for your provider to perform a traditional examination.  This may limit your provider's ability to fully assess your condition.  If your provider identifies any concerns that need to be evaluated in person or the need to arrange testing (such as labs, EKG, etc.), we will make arrangements to do so.     Although advances in technology are sophisticated, we cannot ensure that it will always work on either your end or our end.  If the connection with a video visit is poor, the visit may have to be switched to a telephone visit.  With either a video or telephone visit, we are not always able to ensure that we have a secure connection.     I need to obtain your verbal consent now.   Are you willing to proceed with your visit today?    Ricky Barton has provided verbal consent on 10/28/2020 for a virtual visit (video or telephone).   Apolonio Schneiders, FNP   Date: 10/28/2020 1:23 PM   Virtual Visit via Video Note   I, Apolonio Schneiders, connected with  Ricky Barton  ([id number], [birth year]) on 10/28/20 at  1:30 PM EDT by a video-enabled telemedicine application and verified that I am speaking with the correct person using two identifiers.  Location: Patient: Virtual Visit Location Patient: Home Provider: Virtual Visit Location Provider: Office/Clinic   I discussed the limitations of evaluation and management by  telemedicine and the availability of in person appointments. The patient expressed understanding and agreed to proceed.    History of Present Illness: Ricky Barton is a 65 y.o. who identifies as a male who was assigned male at birth, and is being seen today after testing positive for COVID-19 yesterday.   He started to feel sick 6-7 days ago with a mild cough at night, he uses a CPAP at night and thought that he was just being bothered by that.   Today his symptoms are mainly fatigue, a mild cough at times feels deep like he needs to cough something up.   He has had some diarrhea as well with urgency.   He denies a fever.   He has used Dayquil/Nyquil as needed for symptom management.  Hasn't needed to take any today.   He denies a history of a COVID infection.  He has had two vaccines for COVID so far.   He denies a history of asthma or COPD does have sleep apnea.  Denies using an inhaler in the past.    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 Swallow whole., Disp: , Rfl:    atorvastatin (LIPITOR) 10 MG tablet, TAKE 1 TABLET BY MOUTH EVERY DAY, Disp: 90 tablet,  Rfl: 0   cetirizine (ZYRTEC) 10 MG tablet, TAKE 1 TABLET BY MOUTH EVERY DAY, Disp: 30 tablet, Rfl: 6   ibuprofen (ADVIL,MOTRIN) 200 MG tablet, Take 200 mg by mouth as needed.  , Disp: , Rfl:    Omega-3 Fatty Acids (FISH OIL) 1000 MG CAPS, Take 1-2 capsules (1,000-2,000 mg total) by mouth daily., Disp: , Rfl: 0   sildenafil (VIAGRA) 100 MG tablet, TAKE 1/2 TO 1 TABLET BY MOUTH 1 HOUR PRIOR AS NEEDED, Disp: 9 tablet, Rfl: 6  Observations/Objective: Patient is well-developed, well-nourished in no acute distress.  Resting comfortably at home.  Head is normocephalic, atraumatic.  No labored breathing.  Speech is clear and coherent with logical content.  Patient is alert and oriented at baseline.    Assessment and Plan: 1. COVID-19  Outside of the window for anti-viral treatment.  Continue OTC decongestant as directed   - benzonatate (TESSALON) 100 MG capsule; Take 1 capsule (100 mg total) by mouth 3 (three) times daily as needed for cough (1-2 capsules up to three times daily as needed for cough).  Dispense: 30 capsule; Refill: 0    Follow up with PCP will copy on note.   Follow Up Instructions: I discussed the assessment and treatment plan with the patient. The patient was provided an opportunity to ask questions and all were answered. The patient agreed with the plan and demonstrated an understanding of the instructions.  A copy of instructions were sent to the patient via MyChart.  The patient was advised to call back or seek an in-person evaluation if the symptoms worsen or if the condition fails to improve as anticipated.  Time:  I spent 15 minutes with the patient via telehealth technology discussing the above problems/concerns.    Apolonio Schneiders, FNP

## 2020-10-30 NOTE — Telephone Encounter (Signed)
Called and spoke with patient to let him know that order has been placed to switch from [employer] to [place] for CPAP supplies. He expressed understanding. Nothing further needed at this time.

## 2020-11-30 ENCOUNTER — Other Ambulatory Visit: Payer: Self-pay | Admitting: Family Medicine

## 2020-11-30 DIAGNOSIS — Z125 Encounter for screening for malignant neoplasm of prostate: Secondary | ICD-10-CM

## 2020-11-30 DIAGNOSIS — E785 Hyperlipidemia, unspecified: Secondary | ICD-10-CM

## 2020-11-30 DIAGNOSIS — R739 Hyperglycemia, unspecified: Secondary | ICD-10-CM

## 2020-11-30 DIAGNOSIS — Z87898 Personal history of other specified conditions: Secondary | ICD-10-CM

## 2020-12-03 ENCOUNTER — Other Ambulatory Visit: Payer: BC Managed Care – PPO

## 2020-12-06 ENCOUNTER — Other Ambulatory Visit: Payer: Self-pay

## 2020-12-06 ENCOUNTER — Encounter: Payer: Self-pay | Admitting: Family Medicine

## 2020-12-06 ENCOUNTER — Ambulatory Visit (INDEPENDENT_AMBULATORY_CARE_PROVIDER_SITE_OTHER): Payer: PPO | Admitting: Family Medicine

## 2020-12-06 VITALS — BP 136/82 | HR 89 | Temp 98.2°F | Ht 65.5 in | Wt 212.4 lb

## 2020-12-06 DIAGNOSIS — R739 Hyperglycemia, unspecified: Secondary | ICD-10-CM

## 2020-12-06 DIAGNOSIS — Z7189 Other specified counseling: Secondary | ICD-10-CM | POA: Diagnosis not present

## 2020-12-06 DIAGNOSIS — K130 Diseases of lips: Secondary | ICD-10-CM

## 2020-12-06 DIAGNOSIS — E66811 Obesity, class 1: Secondary | ICD-10-CM

## 2020-12-06 DIAGNOSIS — Z125 Encounter for screening for malignant neoplasm of prostate: Secondary | ICD-10-CM | POA: Diagnosis not present

## 2020-12-06 DIAGNOSIS — Z9989 Dependence on other enabling machines and devices: Secondary | ICD-10-CM

## 2020-12-06 DIAGNOSIS — G8929 Other chronic pain: Secondary | ICD-10-CM

## 2020-12-06 DIAGNOSIS — Z23 Encounter for immunization: Secondary | ICD-10-CM | POA: Diagnosis not present

## 2020-12-06 DIAGNOSIS — E669 Obesity, unspecified: Secondary | ICD-10-CM

## 2020-12-06 DIAGNOSIS — E538 Deficiency of other specified B group vitamins: Secondary | ICD-10-CM

## 2020-12-06 DIAGNOSIS — Z0000 Encounter for general adult medical examination without abnormal findings: Secondary | ICD-10-CM

## 2020-12-06 DIAGNOSIS — E785 Hyperlipidemia, unspecified: Secondary | ICD-10-CM | POA: Diagnosis not present

## 2020-12-06 DIAGNOSIS — H9192 Unspecified hearing loss, left ear: Secondary | ICD-10-CM | POA: Diagnosis not present

## 2020-12-06 DIAGNOSIS — G4733 Obstructive sleep apnea (adult) (pediatric): Secondary | ICD-10-CM

## 2020-12-06 DIAGNOSIS — M25561 Pain in right knee: Secondary | ICD-10-CM | POA: Diagnosis not present

## 2020-12-06 DIAGNOSIS — Z87898 Personal history of other specified conditions: Secondary | ICD-10-CM

## 2020-12-06 DIAGNOSIS — M25562 Pain in left knee: Secondary | ICD-10-CM

## 2020-12-06 LAB — CBC WITH DIFFERENTIAL/PLATELET
Basophils Absolute: 0 10*3/uL (ref 0.0–0.1)
Basophils Relative: 0.5 % (ref 0.0–3.0)
Eosinophils Absolute: 0.2 10*3/uL (ref 0.0–0.7)
Eosinophils Relative: 2.7 % (ref 0.0–5.0)
HCT: 42.8 % (ref 39.0–52.0)
Hemoglobin: 14.2 g/dL (ref 13.0–17.0)
Lymphocytes Relative: 27.2 % (ref 12.0–46.0)
Lymphs Abs: 2.4 10*3/uL (ref 0.7–4.0)
MCHC: 33.2 g/dL (ref 30.0–36.0)
MCV: 91.6 fl (ref 78.0–100.0)
Monocytes Absolute: 0.6 10*3/uL (ref 0.1–1.0)
Monocytes Relative: 6.3 % (ref 3.0–12.0)
Neutro Abs: 5.6 10*3/uL (ref 1.4–7.7)
Neutrophils Relative %: 63.3 % (ref 43.0–77.0)
Platelets: 289 10*3/uL (ref 150.0–400.0)
RBC: 4.67 Mil/uL (ref 4.22–5.81)
RDW: 13.5 % (ref 11.5–15.5)
WBC: 8.8 10*3/uL (ref 4.0–10.5)

## 2020-12-06 LAB — COMPREHENSIVE METABOLIC PANEL
ALT: 16 U/L (ref 0–53)
AST: 19 U/L (ref 0–37)
Albumin: 4.3 g/dL (ref 3.5–5.2)
Alkaline Phosphatase: 70 U/L (ref 39–117)
BUN: 13 mg/dL (ref 6–23)
CO2: 27 mEq/L (ref 19–32)
Calcium: 9.1 mg/dL (ref 8.4–10.5)
Chloride: 104 mEq/L (ref 96–112)
Creatinine, Ser: 0.7 mg/dL (ref 0.40–1.50)
GFR: 96.77 mL/min (ref >60.00)
Glucose, Bld: 96 mg/dL (ref 70–99)
Potassium: 4.2 mEq/L (ref 3.5–5.1)
Sodium: 139 mEq/L (ref 135–145)
Total Bilirubin: 0.7 mg/dL (ref 0.2–1.2)
Total Protein: 7.3 g/dL (ref 6.0–8.3)

## 2020-12-06 LAB — LIPID PANEL
Cholesterol: 126 mg/dL (ref 0–200)
HDL: 45.6 mg/dL (ref >39.00)
LDL Cholesterol: 64 mg/dL (ref 0–99)
NonHDL: 80.59
Total CHOL/HDL Ratio: 3
Triglycerides: 82 mg/dL (ref 0.0–149.0)
VLDL: 16.4 mg/dL (ref 0.0–40.0)

## 2020-12-06 LAB — VITAMIN B12: Vitamin B-12: 270 pg/mL (ref 211–911)

## 2020-12-06 LAB — PSA: PSA: 0.52 ng/mL (ref 0.10–4.00)

## 2020-12-06 LAB — HEMOGLOBIN A1C: Hgb A1c MFr Bld: 5.3 % (ref 4.6–6.5)

## 2020-12-06 LAB — CK: Total CK: 82 U/L (ref 7–232)

## 2020-12-06 MED ORDER — PRAVASTATIN SODIUM 20 MG PO TABS
20.0000 mg | ORAL_TABLET | Freq: Every day | ORAL | 3 refills | Status: DC
Start: 2020-12-06 — End: 2021-12-01

## 2020-12-06 NOTE — Progress Notes (Signed)
Patient ID: Ricky Barton, male    DOB: 05-Sep-1955, 65 y.o.   MRN: [id number]  This visit was conducted in person.  BP 136/82   Pulse 89   Temp 98.2 F (36.8 C) (Temporal)   Ht 5' 5.5" (1.664 m)   Wt 212 lb 6 oz (96.3 kg)   SpO2 97%   BMI 34.80 kg/m    CC: welcome to medicare visit  Subjective:   HPI: Ricky Barton is a 65 y.o. 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   Vision Screening   Right eye Left eye Both eyes  Without correction     With correction 20/25 20/20 20/20     Flowsheet Row Office Visit from 12/06/2020 in [place] at [facility] Total Score 0       Fall Risk  12/06/2020  Falls in the past year? 0    OSA - continues CPAP managed through pulm Dr Mortimer Fries upcoming appt. Planning to start using [employer] in [place].   Ongoing knee and R ankle pain (h/o trauma - fell off ladder with severe sprain 2019). Wonders if statin related. Tried wife's voltaren gel with benefit. No muscle pain. He's started taking osteo-biflex with possible improvement.   Angular cheilitis noted at last dentist office - treating with tea tree oil then aquaphor lip balm with benefit. No discharge. ?B12 deficiency. Denies paresthesias, significant fatigue.   Preventative: COLONOSCOPY 03/2011 - 3 adenomatous polyps, rec rpt 5 yrs (Dr Fuller Plan)  Colonoscopy 07/2016 - WNL, rpt 5 yrs Fuller Plan)  Prostate cancer screening - continue PSA yearly  Lung cancer screening - not eligible  AAA screen - not eligible  Flu shot yearly  Prevnar-20 today  Td 2010, Tdap 05/2017  COVID vaccine - [place] 05/2019, 06/2019  Shingrix - completed 11/2017, 02/2018  Advanced directive planning - does not have set up. Advanced directive packet provided. Would want wife to be HCPOA.  Seat belt use discussed Sunscreen use discussed.  No  changing moles on skin. Sleep - averaging 7 hours/night Non smoker Alcohol - none Dentist - Q51mo  Eye exam - yearly  Bowel - no constipation  Bladder - no incontinence  Caffeine: occasional coffee/tea   Married and lives with wife, grown children, dogs  Occupation: [employer]   Activity: golfing, walks dogs at least 1 mile, joined Y  Diet: good water, fruits/vegetables daily      Relevant past medical, surgical, family and social history reviewed and updated as indicated. Interim medical history since our last visit reviewed. Allergies and medications reviewed and updated. Outpatient Medications Prior to Visit  Medication Sig Dispense Refill   acetaminophen (TYLENOL) 500 MG tablet Take 500 mg by mouth every 6 (six) hours as needed.     aspirin 81 MG EC tablet Take 81 mg by mouth every Monday, Wednesday, and Friday. Swallow whole.     atorvastatin (LIPITOR) 10 MG tablet TAKE 1 TABLET BY MOUTH EVERY DAY 90 tablet 0   cetirizine (ZYRTEC) 10 MG tablet TAKE 1 TABLET BY MOUTH EVERY DAY 30 tablet 6   ibuprofen (ADVIL,MOTRIN) 200 MG tablet Take 200 mg by mouth as needed.       Omega-3 Fatty Acids (FISH OIL) 1000 MG CAPS Take 1-2 capsules (1,000-2,000 mg total) by mouth daily.  0   sildenafil (VIAGRA) 100 MG tablet TAKE 1/2 TO 1 TABLET BY MOUTH 1 HOUR PRIOR AS NEEDED 9 tablet 6   benzonatate (TESSALON) 100 MG capsule Take 1 capsule (100 mg total) by mouth 3 (three) times daily as needed for cough (1-2 capsules up to three times daily as needed for cough). 30 capsule 0   No facility-administered medications prior to visit.     Per HPI unless specifically indicated in ROS section below Review of Systems  Objective:  BP 136/82   Pulse 89   Temp 98.2 F (36.8 C) (Temporal)   Ht 5' 5.5" (1.664 m)   Wt 212 lb 6 oz (96.3 kg)   SpO2 97%   BMI 34.80 kg/m   Wt Readings from Last 3 Encounters:  12/06/20 212 lb 6 oz (96.3 kg)  12/12/19 217 lb 9.6 oz (98.7 kg)  11/14/19 214 lb 8 oz (97.3  kg)      Physical Exam Vitals and nursing note reviewed.  Constitutional:      General: He is not in acute distress.    Appearance: Normal appearance. He is well-developed. He is not ill-appearing.  HENT:     Head: Normocephalic and atraumatic.     Right Ear: Hearing, tympanic membrane, ear canal and external ear normal.     Left Ear: Hearing, tympanic membrane, ear canal and external ear normal.  Eyes:     General: No scleral icterus.    Extraocular Movements: Extraocular movements intact.     Conjunctiva/sclera: Conjunctivae normal.     Pupils: Pupils are equal, round, and reactive to light.  Neck:     Thyroid: No thyroid mass or thyromegaly.     Vascular: No carotid bruit.  Cardiovascular:     Rate and Rhythm: Normal rate and regular rhythm.     Pulses: Normal pulses.          Radial pulses are 2+ on the right side and 2+ on the left side.     Heart sounds: Normal heart sounds. No murmur heard. Pulmonary:     Effort: Pulmonary effort is normal. No respiratory distress.     Breath sounds: Normal breath sounds. No wheezing, rhonchi or rales.  Abdominal:     General: Bowel sounds are normal. There is no distension.     Palpations: Abdomen is soft. There is no mass.     Tenderness: There is no abdominal tenderness. There is no guarding or rebound.     Hernia: No hernia is present.  Musculoskeletal:        General: Normal range of motion.     Cervical back: Normal range of motion and neck supple.     Right lower leg: No edema.     Left lower leg: No edema.     Comments:  Bilateral knee exam: No deformity on inspection. No pain with palpation of knee landmarks. No effusion/swelling noted. FROM in flex/extension without crepitus. No popliteal fullness. Neg drawer test. Neg mcmurray test. No pain with valgus/varus stress. No PFgrind. No abnormal patellar mobility.   Lymphadenopathy:     Cervical: No cervical adenopathy.  Skin:    General: Skin is warm and dry.      Findings: No rash.  Neurological:     General: No focal deficit present.     Mental Status: He is alert and oriented to person, place, and time.     Comments:  Recall 3/3 Calculation 5/5 DLROW  Psychiatric:  Mood and Affect: Mood normal.        Behavior: Behavior normal.        Thought Content: Thought content normal.        Judgment: Judgment normal.      Results for orders placed or performed in visit on 12/06/20  CBC with Differential/Platelet  Result Value Ref Range   WBC 8.8 4.0 - 10.5 K/uL   RBC 4.67 4.22 - 5.81 Mil/uL   Hemoglobin 14.2 13.0 - 17.0 g/dL   HCT 42.8 39.0 - 52.0 %   MCV 91.6 78.0 - 100.0 fl   MCHC 33.2 30.0 - 36.0 g/dL   RDW 13.5 11.5 - 15.5 %   Platelets 289.0 150.0 - 400.0 K/uL   Neutrophils Relative % 63.3 43.0 - 77.0 %   Lymphocytes Relative 27.2 12.0 - 46.0 %   Monocytes Relative 6.3 3.0 - 12.0 %   Eosinophils Relative 2.7 0.0 - 5.0 %   Basophils Relative 0.5 0.0 - 3.0 %   Neutro Abs 5.6 1.4 - 7.7 K/uL   Lymphs Abs 2.4 0.7 - 4.0 K/uL   Monocytes Absolute 0.6 0.1 - 1.0 K/uL   Eosinophils Absolute 0.2 0.0 - 0.7 K/uL   Basophils Absolute 0.0 0.0 - 0.1 K/uL  Hemoglobin A1c  Result Value Ref Range   Hgb A1c MFr Bld 5.3 4.6 - 6.5 %  PSA  Result Value Ref Range   PSA 0.52 0.10 - 4.00 ng/mL  Comprehensive metabolic panel  Result Value Ref Range   Sodium 139 135 - 145 mEq/L   Potassium 4.2 3.5 - 5.1 mEq/L   Chloride 104 96 - 112 mEq/L   CO2 27 19 - 32 mEq/L   Glucose, Bld 96 70 - 99 mg/dL   BUN 13 6 - 23 mg/dL   Creatinine, Ser 0.70 0.40 - 1.50 mg/dL   Total Bilirubin 0.7 0.2 - 1.2 mg/dL   Alkaline Phosphatase 70 39 - 117 U/L   AST 19 0 - 37 U/L   ALT 16 0 - 53 U/L   Total Protein 7.3 6.0 - 8.3 g/dL   Albumin 4.3 3.5 - 5.2 g/dL   GFR 96.77 >60.00 mL/min   Calcium 9.1 8.4 - 10.5 mg/dL  Lipid panel  Result Value Ref Range   Cholesterol 126 0 - 200 mg/dL   Triglycerides 82.0 0.0 - 149.0 mg/dL   HDL 45.60 >39.00 mg/dL   VLDL 16.4 0.0 -  40.0 mg/dL   LDL Cholesterol 64 0 - 99 mg/dL   Total CHOL/HDL Ratio 3    NonHDL 80.59   Vitamin B12  Result Value Ref Range   Vitamin B-12 270 211 - 911 pg/mL  CK  Result Value Ref Range   Total CK 82 7 - 232 U/L   EKG - NSR rate 75, normal axis, intervals, no hypertrophy or acute ST/T changes  Assessment & Plan:  This visit occurred during the SARS-CoV-2 public health emergency.  Safety protocols were in place, including screening questions prior to the visit, additional usage of staff PPE, and extensive cleaning of exam room while observing appropriate contact time as indicated for disinfecting solutions.   Problem List Items Addressed This Visit     Welcome to Medicare preventive visit - Primary (Chronic)    I have personally reviewed the Medicare Annual Wellness questionnaire and have noted 1. The patient's medical and social history 2. Their use of alcohol, tobacco or illicit drugs 3. Their current medications and supplements 4. The patient's functional  ability including ADL's, fall risks, home safety risks and hearing or visual impairment. Cognitive function has been assessed and addressed as indicated.  5. Diet and physical activity 6. Evidence for depression or mood disorders The patients weight, height, BMI have been recorded in the chart. I have made referrals, counseling and provided education to the patient based on review of the above and I have provided the pt with a written personalized care plan for preventive services. Provider list updated.. See scanned questionairre as needed for further documentation. Reviewed preventative protocols and updated unless pt declined.       Relevant Orders   EKG 12-Lead (Completed)   Advanced directives, counseling/discussion (Chronic)    Advanced directive planning - does not have set up. advanced directive packet provided. Would want wife to be HCPOA.       HLD (hyperlipidemia)    Chronic started low dose atorvastatin last  year - update FLP. Started due to strong  fmhx CAD.  He is concerned atorva is causing worsening arthralgias - so will change to lower potency pravastatin.  The ASCVD Risk score (Arnett DK, et al., 2019) failed to calculate for the following reasons:   The valid total cholesterol range is 130 to 320 mg/dL       Relevant Medications   pravastatin (PRAVACHOL) 20 MG tablet   Other Relevant Orders   CK (Completed)   History of seizure   Hyperglycemia    A1c remains in normal range.       Obesity, Class I, BMI 30-34.9    Encouraged healthy diet and lifestyle choices to affect sustainable weight loss.       OSA on CPAP    Followed by pulm - has upcoming appt.       Chronic pain of both knees    Longstanding, worsening R>L. Anticipate OA related. Discussed voltaren gel use, osteo-biflex. He wonders if statin contributing - however without myalgias. Check CPK. Will refer to ortho for further evaluation.       Relevant Orders   Ambulatory referral to Orthopedic Surgery   Left ear hearing loss    Chronic, R hearing remains intact.       Angular cheilitis    Noted by dentist. Check B12.       Relevant Orders   Vitamin B12 (Completed)   Low serum vitamin B12    Possibly contributing to angular cheilitis - rec b12 500-1020mcg daily      Other Visit Diagnoses     Special screening for malignant neoplasm of prostate       Need for vaccination against Streptococcus pneumoniae       Relevant Orders   Pneumococcal conjugate vaccine 20-valent (Completed)   Need for influenza vaccination       Relevant Orders   Flu Vaccine QUAD High Dose(Fluad) (Completed)        Meds ordered this encounter  Medications   pravastatin (PRAVACHOL) 20 MG tablet    Sig: Take 1 tablet (20 mg total) by mouth daily.    Dispense:  90 tablet    Refill:  3    In place of atorvastatin    Orders Placed This Encounter  Procedures   Flu Vaccine QUAD High Dose(Fluad)   Pneumococcal conjugate vaccine  20-valent   Vitamin B12   CK   Ambulatory referral to Orthopedic Surgery    Referral Priority:   Routine    Referral Type:   Surgical    Referral Reason:   Specialty Services  Required    Requested Specialty:   Orthopedic Surgery    Number of Visits Requested:   1   EKG 12-Lead    Patient instructions: Flu shot today  Pneumonia (Prevnar-20) shot today  Labs today  We will refer you to orthopedist for further evaluation of knees. Continue topical voltaren gel.  Change atorvastatin to pravastatin less potent cholesterol medicine I've sent to pharmacy. Let us know if any trouble tolerating  Good to see you today  Return as needed or in 1 year for wellness visit/physical.   Follow up plan: Return in about 1 year (around 12/06/2021), or if symptoms worsen or fail to improve, for annual exam, prior fasting for blood work, medicare wellness visit.  Ria Bush, MD

## 2020-12-06 NOTE — Patient Instructions (Addendum)
Flu shot today  Prevnar-20 shot today  Labs today  We will refer you to orthopedist for further evaluation of knees. Continue topical voltaren gel.  Change atorvastatin to pravastatin less potent cholesterol medicine I've sent to pharmacy. Let us know if any trouble tolerating  Good to see you today  Return as needed or in 1 year for wellness visit/physical.  Health Maintenance After Age 65 After age 5, you are at a higher risk for certain long-term diseases and infections as well as injuries from falls. Falls are a major cause of broken bones and head injuries in people who are older than age 68. Getting regular preventive care can help to keep you healthy and well. Preventive care includes getting regular testing and making lifestyle changes as recommended by your health care provider. Talk with your health care provider about: Which screenings and tests you should have. A screening is a test that checks for a disease when you have no symptoms. A diet and exercise plan that is right for you. What should I know about screenings and tests to prevent falls? Screening and testing are the best ways to find a health problem early. Early diagnosis and treatment give you the best chance of managing medical conditions that are common after age 60. Certain conditions and lifestyle choices may make you more likely to have a fall. Your health care provider may recommend: Regular vision checks. Poor vision and conditions such as cataracts can make you more likely to have a fall. If you wear glasses, make sure to get your prescription updated if your vision changes. Medicine review. Work with your health care provider to regularly review all of the medicines you are taking, including over-the-counter medicines. Ask your health care provider about any side effects that may make you more likely to have a fall. Tell your health care provider if any medicines that you take make you feel dizzy or  sleepy. Osteoporosis screening. Osteoporosis is a condition that causes the bones to get weaker. This can make the bones weak and cause them to break more easily. Blood pressure screening. Blood pressure changes and medicines to control blood pressure can make you feel dizzy. Strength and balance checks. Your health care provider may recommend certain tests to check your strength and balance while standing, walking, or changing positions. Foot health exam. Foot pain and numbness, as well as not wearing proper footwear, can make you more likely to have a fall. Depression screening. You may be more likely to have a fall if you have a fear of falling, feel emotionally low, or feel unable to do activities that you used to do. Alcohol use screening. Using too much alcohol can affect your balance and may make you more likely to have a fall. What actions can I take to lower my risk of falls? General instructions Talk with your health care provider about your risks for falling. Tell your health care provider if: You fall. Be sure to tell your health care provider about all falls, even ones that seem minor. You feel dizzy, sleepy, or off-balance. Take over-the-counter and prescription medicines only as told by your health care provider. These include any supplements. Eat a healthy diet and maintain a healthy weight. A healthy diet includes low-fat dairy products, low-fat (lean) meats, and fiber from whole grains, beans, and lots of fruits and vegetables. Home safety Remove any tripping hazards, such as rugs, cords, and clutter. Install safety equipment such as grab bars in bathrooms and safety  rails on stairs. Keep rooms and walkways well-lit. Activity  Follow a regular exercise program to stay fit. This will help you maintain your balance. Ask your health care provider what types of exercise are appropriate for you. If you need a cane or walker, use it as recommended by your health care provider. Wear  supportive shoes that have nonskid soles. Lifestyle Do not drink alcohol if your health care provider tells you not to drink. If you drink alcohol, limit how much you have: 0-1 drink a day for women. 0-2 drinks a day for men. Be aware of how much alcohol is in your drink. In the [place] one drink equals one typical bottle of beer (12 oz), one-half glass of wine (5 oz), or one shot of hard liquor (1 oz). Do not use any products that contain nicotine or tobacco, such as cigarettes and e-cigarettes. If you need help quitting, ask your health care provider. Summary Having a healthy lifestyle and getting preventive care can help to protect your health and wellness after age 64. Screening and testing are the best way to find a health problem early and help you avoid having a fall. Early diagnosis and treatment give you the best chance for managing medical conditions that are more common for people who are older than age 69. Falls are a major cause of broken bones and head injuries in people who are older than age 60. Take precautions to prevent a fall at home. Work with your health care provider to learn what changes you can make to improve your health and wellness and to prevent falls. This information is not intended to replace advice given to you by your health care provider. Make sure you discuss any questions you have with your health care provider. Document Revised: 05/10/2020 Document Reviewed: 02/16/2020 Elsevier Patient Education  2022 [employer].

## 2020-12-06 NOTE — Assessment & Plan Note (Signed)
Advanced directive planning - does not have set up. advanced directive packet provided. Would want wife to be HCPOA.

## 2020-12-06 NOTE — Assessment & Plan Note (Signed)

## 2020-12-07 DIAGNOSIS — E538 Deficiency of other specified B group vitamins: Secondary | ICD-10-CM | POA: Insufficient documentation

## 2020-12-07 NOTE — Assessment & Plan Note (Signed)
Encouraged healthy diet and lifestyle choices to affect sustainable weight loss.  ?

## 2020-12-07 NOTE — Assessment & Plan Note (Signed)
Noted by dentist. Check B12.

## 2020-12-07 NOTE — Assessment & Plan Note (Signed)
Followed by pulm - has upcoming appt.

## 2020-12-07 NOTE — Assessment & Plan Note (Signed)
Possibly contributing to angular cheilitis - rec b12 500-1041mcg daily

## 2020-12-07 NOTE — Assessment & Plan Note (Signed)
Chronic, R hearing remains intact.

## 2020-12-07 NOTE — Assessment & Plan Note (Addendum)
Chronic started low dose atorvastatin last year - update FLP. Started due to strong  fmhx CAD.  He is concerned atorva is causing worsening arthralgias - so will change to lower potency pravastatin.  The ASCVD Risk score (Arnett DK, et al., 2019) failed to calculate for the following reasons:   The valid total cholesterol range is 130 to 320 mg/dL

## 2020-12-07 NOTE — Assessment & Plan Note (Signed)
Longstanding, worsening R>L. Anticipate OA related. Discussed voltaren gel use, osteo-biflex. He wonders if statin contributing - however without myalgias. Check CPK. Will refer to ortho for further evaluation.

## 2020-12-07 NOTE — Assessment & Plan Note (Signed)
A1c remains in normal range. 

## 2020-12-09 ENCOUNTER — Encounter: Payer: Self-pay | Admitting: Primary Care

## 2020-12-09 ENCOUNTER — Ambulatory Visit: Payer: PPO | Admitting: Primary Care

## 2020-12-09 ENCOUNTER — Other Ambulatory Visit: Payer: Self-pay

## 2020-12-09 DIAGNOSIS — G4733 Obstructive sleep apnea (adult) (pediatric): Secondary | ICD-10-CM

## 2020-12-09 DIAGNOSIS — Z9989 Dependence on other enabling machines and devices: Secondary | ICD-10-CM | POA: Diagnosis not present

## 2020-12-09 DIAGNOSIS — J309 Allergic rhinitis, unspecified: Secondary | ICD-10-CM | POA: Insufficient documentation

## 2020-12-09 NOTE — Progress Notes (Signed)
@Patient  ID: Ricky Barton, male    DOB: Mar 13, 1956, 65 y.o.   MRN: [id number]  Chief Complaint  Patient presents with   Follow-up    Pt states no concerns     Referring provider: Ria Bush, MD  HPI: 65 year old male, never smoked.  Past medical history significant for OSA, allergic rhinitis, hyperlipidemia, history of seizure, chronic knee pain, hyperglycemia, obesity.  Patient of Dr. Mortimer Fries, last seen in office on 12/12/2019.  Home sleep study on 02/01/2019 showed severe obstructive sleep apnea, AHI 53.4 with SPO2 low 67%.  12/09/2020- Interim hx  Patient presents today for an annual follow-up for obstructive sleep apnea. He is sleeping well, knee pain wakes him up at night. No residual daytime fatigue. No issues with CPAP mask or pressure setting. He uses full face nasal mask. An order was placed for him to switch from [facility] to [facility] for cpap supplies. They are needing office visit notes faxed from today's visit. [place] 7. Patient experiences occasional chest tightness in the morning or while working outside. He takes Cetirizine 10mg  daily with reported relief of his symptoms.   Airview download 11/06/20-12/05/20 Usage 30/30 days used; 100% > 4 hours Average usage 6 hours 54 mins Pressure 5-15cm h20 (11.2-95%) Airleaks 9.6L/min (95%) AHI 0.4  Allergies  Allergen Reactions   Piroxicam Rash    blisters    Immunization History  Administered Date(s) Administered   Fluad Quad(high Dose 65+) 12/06/2020   Influenza Split 03/04/2011, 03/04/2012   Influenza Whole 01/24/2009, 02/26/2010   Influenza,inj,Quad PF,6+ Mos 03/30/2013, 04/05/2014, 11/29/2017, 11/10/2018   PFIZER(Purple Top)SARS-COV-2 Vaccination 06/03/2019, 06/26/2019   PNEUMOCOCCAL CONJUGATE-20 12/06/2020   Td 01/18/1996, 01/24/2009   Tdap 06/07/2017, 11/18/2018   Zoster Recombinat (Shingrix) 11/29/2017, 03/07/2018    Past Medical History:  Diagnosis Date   Arthritis    Hypoglycemic syndrome     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     aspirin 81 MG EC tablet Take 81 mg by mouth every Monday, Wednesday, and Friday. Swallow whole.     cetirizine (ZYRTEC) 10 MG tablet TAKE 1 TABLET BY MOUTH EVERY DAY 30 tablet 6   ibuprofen (ADVIL,MOTRIN) 200 MG tablet Take 200 mg by mouth as needed.       Omega-3 Fatty Acids (FISH OIL) 1000 MG CAPS Take 1-2 capsules (1,000-2,000 mg total) by mouth daily.  0   pravastatin (PRAVACHOL) 20 MG tablet Take 1 tablet (20 mg total) by mouth daily. 90 tablet 3   sildenafil (VIAGRA) 100 MG tablet TAKE 1/2 TO 1 TABLET BY MOUTH 1 HOUR PRIOR AS NEEDED 9 tablet 6   atorvastatin (LIPITOR) 10 MG tablet TAKE 1 TABLET BY MOUTH EVERY DAY (Patient not taking: Reported on 12/09/2020) 90 tablet 0   No facility-administered medications prior to visit.      Review of Systems  Review of Systems  Constitutional: Negative.   Respiratory: Negative.    Psychiatric/Behavioral: Negative.      Physical Exam  BP  130/82 (BP Location: Left Arm, Patient Position: Sitting, Cuff Size: Large)   Pulse 73   Temp 97.8 F (36.6 C)   Ht 5' 5.5" (1.664 m)   Wt 217 lb 3.2 oz (98.5 kg)   SpO2 97%   BMI 35.59 kg/m  Physical Exam Constitutional:      Appearance: Normal appearance.  HENT:     Head: Normocephalic and atraumatic.     Mouth/Throat:     Mouth: Mucous membranes are moist.     Pharynx: Oropharynx is clear.  Cardiovascular:     Rate and Rhythm: Normal rate and regular rhythm.     Comments: RRR Pulmonary:      Effort: Pulmonary effort is normal.     Breath sounds: Normal breath sounds.     Comments: CTA Skin:    General: Skin is warm and dry.  Neurological:     General: No focal deficit present.     Mental Status: He is alert and oriented to person, place, and time. Mental status is at baseline.  Psychiatric:        Mood and Affect: Mood normal.        Behavior: Behavior normal.        Thought Content: Thought content normal.        Judgment: Judgment normal.     Lab Results:  CBC    Component Value Date/Time   WBC 8.8 12/06/2020 1159   RBC 4.67 12/06/2020 1159   HGB 14.2 12/06/2020 1159   HCT 42.8 12/06/2020 1159   PLT 289.0 12/06/2020 1159   MCV 91.6 12/06/2020 1159   MCH 30.8 03/06/2019 1709   MCHC 33.2 12/06/2020 1159   RDW 13.5 12/06/2020 1159   LYMPHSABS 2.4 12/06/2020 1159   MONOABS 0.6 12/06/2020 1159   EOSABS 0.2 12/06/2020 1159   BASOSABS 0.0 12/06/2020 1159    BMET    Component Value Date/Time   NA 139 12/06/2020 1159   K 4.2 12/06/2020 1159   CL 104 12/06/2020 1159   CO2 27 12/06/2020 1159   GLUCOSE 96 12/06/2020 1159   BUN 13 12/06/2020 1159   CREATININE 0.70 12/06/2020 1159   CALCIUM 9.1 12/06/2020 1159   GFRNONAA >60 03/06/2019 1709   GFRAA >60 03/06/2019 1709    BNP No results found for: BNP  ProBNP No results found for: PROBNP  Imaging: No results found.   Assessment & Plan:   OSA on CPAP - Home sleep study in November 2020 showed severe obstructive sleep apnea, AHI 53.4/hr. He is 100% compliant with CPAP use > 4 hours. Current pressure setting auto titrate 5-15cm h20 with residual AHI 0.4. Switching DME companied from [facility] to [facility]. No changes today. Follow-up in 1 year or sooner if needed.   Allergic rhinitis - Continue Cetirizine 10mg  daily    Martyn Ehrich, NP 12/09/2020

## 2020-12-09 NOTE — Assessment & Plan Note (Signed)
-   Continue Cetirizine 10mg  daily

## 2020-12-09 NOTE — Assessment & Plan Note (Addendum)
-   Home sleep study in November 2020 showed severe obstructive sleep apnea, AHI 53.4/hr. He is 100% compliant with CPAP use > 4 hours. Current pressure setting auto titrate 5-15cm h20 with residual AHI 0.4. Switching DME companied from [facility] to [facility]. No changes today. Follow-up in 1 year or sooner if needed.

## 2020-12-09 NOTE — Patient Instructions (Addendum)
Sleep apnea: - Continue to use CPAP every night for 4 to 6 hours or longer - Do not drive if experiencing excessive daytime fatigue - Continue to work on weight loss efforts  Allergic rhinitis: - Continue Cetirizine 10mg  daily   Orders: - Please fax note to [facility] - 1 year with Dr. Marchia Bond or sooner if needed   CPAP and BPAP Information CPAP and BPAP (also called BiPAP) are methods that use air pressure to keep your airways open and to help you breathe well. CPAP and BPAP use different amounts of pressure. Your health care provider will tell you whether CPAP or BPAP would be more helpful for you. CPAP stands for "continuous positive airway pressure." With CPAP, the amount of pressure stays the same while you breathe in (inhale) and out (exhale). BPAP stands for "bi-level positive airway pressure." With BPAP, the amount of pressure will be higher when you inhale and lower when you exhale. This allows you to take larger breaths. CPAP or BPAP may be used in the hospital, or your health care provider may want you to use it at home. You may need to have a sleep study before your health care provider can order a machine for you to use at home. What are the advantages? CPAP or BPAP can be helpful if you have: Sleep apnea. Chronic obstructive pulmonary disease (COPD). Heart failure. Medical conditions that cause muscle weakness, including muscular dystrophy or amyotrophic lateral sclerosis (ALS). Other problems that cause breathing to be shallow, weak, abnormal, or difficult. CPAP and BPAP are most commonly used for obstructive sleep apnea (OSA) to keep the airways from collapsing when the muscles relax during sleep. What are the risks? Generally, this is a safe treatment. However, problems may occur, including: Irritated skin or skin sores if the mask does not fit properly. Dry or stuffy nose or nosebleeds. Dry mouth. Feeling gassy or bloated. Sinus or lung infection  if the equipment is not cleaned properly. When should CPAP or BPAP be used? In most cases, the mask only needs to be worn during sleep. Generally, the mask needs to be worn throughout the night and during any daytime naps. People with certain medical conditions may also need to wear the mask at other times, such as when they are awake. Follow instructions from your health care provider about when to use the machine. What happens during CPAP or BPAP? Both CPAP and BPAP are provided by a small machine with a flexible plastic tube that attaches to a plastic mask that you wear. Air is blown through the mask into your nose or mouth. The amount of pressure that is used to blow the air can be adjusted on the machine. Your health care provider will set the pressure setting and help you find the best mask for you. Tips for using the mask Because the mask needs to be snug, some people feel trapped or closed-in (claustrophobic) when first using the mask. If you feel this way, you may need to get used to the mask. One way to do this is to hold the mask loosely over your nose or mouth and then gradually apply the mask more snugly. You can also gradually increase the amount of time that you use the mask. Masks are available in various types and sizes. If your mask does not fit well, talk with your health care provider about getting a different one. Some common types of masks include: Full face masks, which fit over the  mouth and nose. Nasal masks, which fit over the nose. Nasal pillow or prong masks, which fit into the nostrils. If you are using a mask that fits over your nose and you tend to breathe through your mouth, a chin strap may be applied to help keep your mouth closed. Use a skin barrier to protect your skin as told by your health care provider. Some CPAP and BPAP machines have alarms that may sound if the mask comes off or develops a leak. If you have trouble with the mask, it is very important that you  talk with your health care provider about finding a way to make the mask easier to tolerate. Do not stop using the mask. There could be a negative impact on your health if you stop using the mask. Tips for using the machine Place your CPAP or BPAP machine on a secure table or stand near an electrical outlet. Know where the on/off switch is on the machine. Follow instructions from your health care provider about how to set the pressure on your machine and when you should use it. Do not eat or drink while the CPAP or BPAP machine is on. Food or fluids could get pushed into your lungs by the pressure of the CPAP or BPAP. For home use, CPAP and BPAP machines can be rented or purchased through home health care companies. Many different brands of machines are available. Renting a machine before purchasing may help you find out which particular machine works well for you. Your health insurance company may also decide which machine you may get. Keep the CPAP or BPAP machine and attachments clean. Ask your health care provider for specific instructions. Check the humidifier if you have a dry stuffy nose or nosebleeds. Make sure it is working correctly. Follow these instructions at home: Take over-the-counter and prescription medicines only as told by your health care provider. Ask if you can take sinus medicine if your sinuses are blocked. Do not use any products that contain nicotine or tobacco. These products include cigarettes, chewing tobacco, and vaping devices, such as e-cigarettes. If you need help quitting, ask your health care provider. Keep all follow-up visits. This is important. Contact a health care provider if: You have redness or pressure sores on your head, face, mouth, or nose from the mask or head gear. You have trouble using the CPAP or BPAP machine. You cannot tolerate wearing the CPAP or BPAP mask. Someone tells you that you snore even when wearing your CPAP or BPAP. Get help right away  if: You have trouble breathing. You feel confused. Summary CPAP and BPAP are methods that use air pressure to keep your airways open and to help you breathe well. If you have trouble with the mask, it is very important that you talk with your health care provider about finding a way to make the mask easier to tolerate. Do not stop using the mask. There could be a negative impact to your health if you stop using the mask. Follow instructions from your health care provider about when to use the machine. This information is not intended to replace advice given to you by your health care provider. Make sure you discuss any questions you have with your health care provider. Document Revised: 02/09/2020 Document Reviewed: 02/09/2020 Elsevier Patient Education  2022 [employer].

## 2020-12-10 DIAGNOSIS — G4733 Obstructive sleep apnea (adult) (pediatric): Secondary | ICD-10-CM | POA: Diagnosis not present

## 2020-12-10 DIAGNOSIS — I10 Essential (primary) hypertension: Secondary | ICD-10-CM | POA: Diagnosis not present

## 2020-12-19 ENCOUNTER — Other Ambulatory Visit: Payer: Self-pay | Admitting: Family Medicine

## 2020-12-26 ENCOUNTER — Encounter: Payer: Self-pay | Admitting: Orthopaedic Surgery

## 2020-12-26 ENCOUNTER — Ambulatory Visit: Payer: Self-pay

## 2020-12-26 ENCOUNTER — Other Ambulatory Visit: Payer: Self-pay

## 2020-12-26 ENCOUNTER — Ambulatory Visit: Payer: PPO | Admitting: Orthopaedic Surgery

## 2020-12-26 DIAGNOSIS — M25561 Pain in right knee: Secondary | ICD-10-CM | POA: Diagnosis not present

## 2020-12-26 DIAGNOSIS — G8929 Other chronic pain: Secondary | ICD-10-CM | POA: Diagnosis not present

## 2020-12-26 DIAGNOSIS — M25562 Pain in left knee: Secondary | ICD-10-CM | POA: Diagnosis not present

## 2020-12-26 MED ORDER — BUPIVACAINE HCL 0.5 % IJ SOLN
2.0000 mL | INTRAMUSCULAR | Status: AC | PRN
  Administered 2020-12-26: 2 mL via INTRA_ARTICULAR

## 2020-12-26 MED ORDER — LIDOCAINE HCL 1 % IJ SOLN
2.0000 mL | INTRAMUSCULAR | Status: AC | PRN
  Administered 2020-12-26: 2 mL

## 2020-12-26 MED ORDER — METHYLPREDNISOLONE ACETATE 40 MG/ML IJ SUSP
40.0000 mg | INTRAMUSCULAR | Status: AC | PRN
  Administered 2020-12-26: 40 mg via INTRA_ARTICULAR

## 2020-12-26 NOTE — Progress Notes (Signed)
Office Visit Note   Patient: Ricky Barton           Date of Birth: December 09, 1955           MRN: [id number] Visit Date: 12/26/2020              Requested by: Ria Bush, MD [place],  [place] [address] PCP: Ria Bush, MD   Assessment & Plan: Visit Diagnoses:  1. Chronic pain of left knee   2. Chronic pain of right knee     Plan: Ricky Barton does have fairly advanced DJD to both of his knees but so far the pain and symptoms are manageable but he is interested in a cortisone injection today which we did for the left knee.  We also provided him with information on visco injections, HEP, and knee replacement surgery.  He will continue to use voltaren gel and glucosamine and may also try oral NSAIDs.  He will follow up as needed.    Follow-Up Instructions: No follow-ups on file.   Orders:  Orders Placed This Encounter  Procedures  . XR KNEE 3 VIEW LEFT  . XR KNEE 3 VIEW RIGHT   No orders of the defined types were placed in this encounter.     Procedures: Large Joint Inj: L knee on 12/26/2020 4:51 PM Details: 22 G needle Medications: 2 mL bupivacaine 0.5 %; 2 mL lidocaine 1 %; 40 mg methylPREDNISolone acetate 40 MG/ML Outcome: tolerated well, no immediate complications Patient was prepped and draped in the usual sterile fashion.      Clinical Data: No additional findings.   Subjective: Chief Complaint  Patient presents with  . Right Knee - Pain  . Left Knee - Pain    Ricky Barton is a very pleasant 65 year old male who presents today for evaluation of chronic bilateral knee pain with gradual worsening.  He has used voltaren gel and takes osteobiflex daily.  He has not had any injections, PT or HEP.  Has not had any injuries or trauma or surgeries to the knees.  He does use a compression knee brace for activity.  He is retired.     Review of Systems  Constitutional: Negative.   All other systems reviewed and are  negative.   Objective: Vital Signs: There were no vitals taken for this visit.  Physical Exam Vitals and nursing note reviewed.  Constitutional:      Appearance: He is well-developed.  Pulmonary:     Effort: Pulmonary effort is normal.  Abdominal:     Palpations: Abdomen is soft.  Skin:    General: Skin is warm.  Neurological:     Mental Status: He is alert and oriented to person, place, and time.  Psychiatric:        Behavior: Behavior normal.        Thought Content: Thought content normal.        Judgment: Judgment normal.    Ortho Exam  Bilateral knees show trace effusions.  Mild limitation in ROM with minimal crepitus.  Mild varus alignment.  No joint line tenderness. Specialty Comments:  No specialty comments available.  Imaging: XR KNEE 3 VIEW LEFT  Result Date: 12/26/2020 Advanced tricompartmental DJD with bone on bone joint space narrowing and mild varus deformity.  XR KNEE 3 VIEW RIGHT  Result Date: 12/26/2020 Advanced tricompartmental DJD with bone on bone joint space narrowing and mild varus deformity.    PMFS History: Patient Active Problem List  Diagnosis Date Noted  . Allergic rhinitis 12/09/2020  . Low serum vitamin B12 12/07/2020  . Welcome to Medicare preventive visit 12/06/2020  . Advanced directives, counseling/discussion 12/06/2020  . Angular cheilitis 12/06/2020  . 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 Elevated blood pressure reading without diagnosis of hypertension 11/10/2018  . Renal lesion 03/28/2018  . Chronic pain of both knees 10/06/2016  . Arthralgia of hands, bilateral 09/12/2015  . OSA on CPAP 09/12/2015  . Obesity, Class I, BMI 30-34.9 04/05/2014  . Hyperglycemia 03/30/2013  . Healthcare maintenance 03/04/2012  . HLD (hyperlipidemia) 01/18/2009  . 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 History of seizure 08/15/1991   Past Medical History:  Diagnosis Date  . Arthritis   . Hypoglycemic syndrome   . 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 OSA on CPAP 09/12/2015   Severe by HST  . Seizures ([place]) 1990s   isolated, none since, was on dilantin for 3 yrs, then stopped    Family History  Problem Relation Age of Onset  . Hypertension Mother   . Colon polyps Mother   . Cancer Father 35       stomach cancer  . Hyperlipidemia Brother   . CAD Brother 43       MI (smoker)  . Hyperlipidemia Brother   . Sleep apnea Brother   . Pancreatic cancer Brother 26  . Hyperlipidemia Brother   . CAD Brother 33       MI (smoker)  . Hyperlipidemia Brother   . Multiple sclerosis Brother   . Colon cancer Neg Hx   . Esophageal cancer Neg Hx   . Rectal cancer Neg Hx     Past Surgical History:  Procedure Laterality Date  . CHOLECYSTECTOMY  1989  . COLONOSCOPY  03/2011   3 adenomatous polyps, rec rpt 5 yrs (Dr Fuller Plan).  . COLONOSCOPY  07/2015   WNL, rpt 5 yrs Fuller Plan)  . Head MRI  06/02  . POLYPECTOMY     Social History   Occupational History  . Occupation: Route Social research officer, government: [employer]    Comment: [employer]  Tobacco Use  . Smoking status: Never  . Smokeless tobacco: Never  Vaping Use  . Vaping Use: Never used  Substance and Sexual Activity  . Alcohol use: No  . Drug use: No  . Sexual activity: Yes

## 2021-02-26 DIAGNOSIS — H5203 Hypermetropia, bilateral: Secondary | ICD-10-CM | POA: Diagnosis not present

## 2021-02-26 DIAGNOSIS — H524 Presbyopia: Secondary | ICD-10-CM | POA: Diagnosis not present

## 2021-02-26 DIAGNOSIS — H2513 Age-related nuclear cataract, bilateral: Secondary | ICD-10-CM | POA: Diagnosis not present

## 2021-03-03 DIAGNOSIS — G4733 Obstructive sleep apnea (adult) (pediatric): Secondary | ICD-10-CM | POA: Diagnosis not present

## 2021-03-04 ENCOUNTER — Ambulatory Visit: Payer: PPO | Admitting: Orthopaedic Surgery

## 2021-03-04 ENCOUNTER — Encounter: Payer: Self-pay | Admitting: Orthopaedic Surgery

## 2021-03-04 ENCOUNTER — Other Ambulatory Visit: Payer: Self-pay

## 2021-03-04 DIAGNOSIS — M1711 Unilateral primary osteoarthritis, right knee: Secondary | ICD-10-CM | POA: Diagnosis not present

## 2021-03-04 MED ORDER — METHYLPREDNISOLONE ACETATE 40 MG/ML IJ SUSP
40.0000 mg | INTRAMUSCULAR | Status: AC | PRN
  Administered 2021-03-04: 14:00:00 40 mg via INTRA_ARTICULAR

## 2021-03-04 MED ORDER — BUPIVACAINE HCL 0.5 % IJ SOLN
2.0000 mL | INTRAMUSCULAR | Status: AC | PRN
  Administered 2021-03-04: 14:00:00 2 mL via INTRA_ARTICULAR

## 2021-03-04 MED ORDER — LIDOCAINE HCL 1 % IJ SOLN
2.0000 mL | INTRAMUSCULAR | Status: AC | PRN
  Administered 2021-03-04: 14:00:00 2 mL

## 2021-03-04 NOTE — Progress Notes (Signed)
Office Visit Note   Patient: Ricky Barton           Date of Birth: Jul 30, 1955           MRN: [id number] Visit Date: 03/04/2021              Requested by: Ria Bush, MD [place],  [place] [address] PCP: Ria Bush, MD   Assessment & Plan: Visit Diagnoses:  1. Primary osteoarthritis of right knee     Plan: Impression is right knee DJD.  Cortisone injection administered today.  We had a brief discussion again on knee replacement surgery as a more definitive solution.    Follow-Up Instructions: No follow-ups on file.   Orders:  No orders of the defined types were placed in this encounter.  No orders of the defined types were placed in this encounter.     Procedures: Large Joint Inj: R knee on 03/04/2021 2:04 PM Indications: pain Details: 22 G needle  Arthrogram: No  Medications: 40 mg methylPREDNISolone acetate 40 MG/ML; 2 mL lidocaine 1 %; 2 mL bupivacaine 0.5 % Consent was given by the patient. Patient was prepped and draped in the usual sterile fashion.      Clinical Data: No additional findings.   Subjective: Chief Complaint  Patient presents with   Left Knee - Pain    Mr. Compean returns today for chronic right knee pain due to DJD.  We performed left knee cortisone injection in October which has went well.  He would like 1 for the right knee today.   Review of Systems   Objective: Vital Signs: There were no vitals taken for this visit.  Physical Exam  Ortho Exam  Right knee exam is unchanged.  Specialty Comments:  No specialty comments available.  Imaging: No results found.   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   Elevated blood  pressure reading without diagnosis of hypertension 11/10/2018   Renal lesion 03/28/2018   Chronic pain of both knees 10/06/2016   Arthralgia of hands, bilateral 09/12/2015   OSA on CPAP 09/12/2015   Obesity, Class I, BMI 30-34.9 04/05/2014   Hyperglycemia 03/30/2013   Healthcare maintenance 03/04/2012   HLD (hyperlipidemia) 01/18/2009   ERECTILE DYSFUNCTION, 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   History of seizure 08/15/1991   Past Medical History:  Diagnosis Date   Arthritis    Hypoglycemic syndrome    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    OSA on CPAP 09/12/2015   Severe by HST   Seizures ([place]) 1990s   isolated, none since, was on dilantin for 3 yrs, then stopped    Family History  Problem Relation Age of Onset   Hypertension Mother    Colon polyps Mother    Cancer Father 66       stomach cancer   Hyperlipidemia Brother    CAD Brother 38       MI (smoker)   Hyperlipidemia Brother    Sleep apnea Brother    Pancreatic cancer Brother 79   Hyperlipidemia Brother    CAD Brother 54       MI (smoker)   Hyperlipidemia Brother    Multiple sclerosis Brother    Colon cancer  Neg Hx    Esophageal cancer Neg Hx    Rectal cancer Neg Hx     Past Surgical History:  Procedure Laterality Date   CHOLECYSTECTOMY  1989   COLONOSCOPY  03/2011   3 adenomatous polyps, rec rpt 5 yrs (Dr Fuller Plan).   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

## 2021-03-21 ENCOUNTER — Telehealth: Payer: Self-pay | Admitting: Orthopaedic Surgery

## 2021-03-21 NOTE — Telephone Encounter (Signed)
Patient called. He lost the paper work that Dr. Erlinda Hong gave to read over about the knee replacement. He asked if he could get the papers again. He says to e-mail it or he can come and pick it up. [phone]

## 2021-03-25 NOTE — Telephone Encounter (Signed)
Ready for pick up at the front desk. Patient aware.

## 2021-04-18 ENCOUNTER — Other Ambulatory Visit: Payer: Self-pay

## 2021-05-05 ENCOUNTER — Other Ambulatory Visit: Payer: Self-pay | Admitting: Physician Assistant

## 2021-05-05 MED ORDER — ASPIRIN EC 81 MG PO TBEC: 81.0000 mg | DELAYED_RELEASE_TABLET | Freq: Two times a day (BID) | ORAL | 0 refills | Status: DC

## 2021-05-05 MED ORDER — ONDANSETRON HCL 4 MG PO TABS: 4.0000 mg | ORAL_TABLET | Freq: Three times a day (TID) | ORAL | 0 refills | Status: DC | PRN

## 2021-05-05 MED ORDER — OXYCODONE-ACETAMINOPHEN 5-325 MG PO TABS
1.0000 | ORAL_TABLET | Freq: Four times a day (QID) | ORAL | 0 refills | Status: DC | PRN
Start: 2021-05-05 — End: 2021-05-27

## 2021-05-05 MED ORDER — DOCUSATE SODIUM 100 MG PO CAPS: 100.0000 mg | ORAL_CAPSULE | Freq: Every day | ORAL | 2 refills | Status: DC | PRN

## 2021-05-05 MED ORDER — METHOCARBAMOL 500 MG PO TABS: 500.0000 mg | ORAL_TABLET | Freq: Two times a day (BID) | ORAL | 0 refills | Status: DC | PRN

## 2021-05-07 NOTE — Pre-Procedure Instructions (Signed)
Surgical Instructions    Your procedure is scheduled on Monday 05/12/21.   Report to [facility] Main Entrance "A" at 07:50 A.M., then check in with the Admitting office.  Call this number if you have problems the morning of surgery:  [phone]   If you have any questions prior to your surgery date call [phone]: Open Monday-Friday 8am-4pm    Remember:  Do not eat after midnight the night before your surgery  You may drink clear liquids until 06:50 A.M. the morning of your surgery.   Clear liquids allowed are: Water, Non-Citrus Juices (without pulp), Carbonated Beverages, Clear Tea, Black Coffee ONLY (NO MILK, CREAM OR POWDERED CREAMER of any kind), and Gatorade   Enhanced Recovery after Surgery for Orthopedics Enhanced Recovery after Surgery is a protocol used to improve the stress on your body and your recovery after surgery.  Patient Instructions  The day of surgery (if you do NOT have diabetes):  Drink ONE (1) Pre-Surgery Clear Ensure by 06:50 am the morning of surgery   This drink was given to you during your hospital  pre-op appointment visit. Nothing else to drink after completing the  Pre-Surgery Clear Ensure.         If you have questions, please contact your surgeons office.     Take these medicines the morning of surgery with A SIP OF WATER:   pravastatin (PRAVACHOL)    cetirizine (ZYRTEC)- If needed  methocarbamol (ROBAXIN)- If needed  ondansetron [facility])- If needed  As of today, STOP taking any Aspirin (unless otherwise instructed by your surgeon) Aleve, Naproxen, Ibuprofen, Motrin, Advil, Goody's, BC's, all herbal medications, fish oil, and all vitamins.           Do not wear jewelry or makeup Do not wear lotions, powders, perfumes/colognes, or deodorant. Do not shave 48 hours prior to surgery.  Men may shave face and neck. Do not bring valuables to the hospital. Do not wear nail polish, gel polish, artificial nails, or any other type of covering  on natural nails (fingers and toes) If you have artificial nails or gel coating that need to be removed by a nail salon, please have this removed prior to surgery. Artificial nails or gel coating may interfere with anesthesia's ability to adequately monitor your vital signs.  [facility] is not responsible for any belongings or valuables. .   Do NOT Smoke (Tobacco/Vaping)  24 hours prior to your procedure  If you use a CPAP at night, you may bring your mask for your overnight stay.   Contacts, glasses, hearing aids, dentures or partials may not be worn into surgery, please bring cases for these belongings   For patients admitted to the hospital, discharge time will be determined by your treatment team.   Patients discharged the day of surgery will not be allowed to drive home, and someone needs to stay with them for 24 hours.  NO VISITORS WILL BE ALLOWED IN PRE-OP WHERE PATIENTS ARE PREPPED FOR SURGERY.  ONLY 1 SUPPORT PERSON MAY BE PRESENT IN THE WAITING ROOM WHILE YOU ARE IN SURGERY.  IF YOU ARE TO BE ADMITTED, ONCE YOU ARE IN YOUR ROOM YOU WILL BE ALLOWED TWO (2) VISITORS. 1 (ONE) VISITOR MAY STAY OVERNIGHT BUT MUST ARRIVE TO THE ROOM BY 8pm.  Minor children may have two parents present. Special consideration for safety and communication needs will be reviewed on a case by case basis.  Special instructions:    Oral Hygiene is also important to reduce  your risk of infection.  Remember - BRUSH YOUR TEETH THE MORNING OF SURGERY WITH YOUR REGULAR TOOTHPASTE   [facility]- Preparing For Surgery  Before surgery, you can play an important role. Because skin is not sterile, your skin needs to be as free of germs as possible. You can reduce the number of germs on your skin by washing with CHG (chlorahexidine gluconate) Soap before surgery.  CHG is an antiseptic cleaner which kills germs and bonds with the skin to continue killing germs even after washing.     Please do not use if you have an  allergy to CHG or antibacterial soaps. If your skin becomes reddened/irritated stop using the CHG.  Do not shave (including legs and underarms) for at least 48 hours prior to first CHG shower. It is OK to shave your face.  Please follow these instructions carefully.     Shower the NIGHT BEFORE SURGERY and the MORNING OF SURGERY with CHG Soap.   If you chose to wash your hair, wash your hair first as usual with your normal shampoo. After you shampoo, rinse your hair and body thoroughly to remove the shampoo.  Then [employer] and genitals (private parts) with your normal soap and rinse thoroughly to remove soap.  After that Use CHG Soap as you would any other liquid soap. You can apply CHG directly to the skin and wash gently with a scrungie or a clean washcloth.   Apply the CHG Soap to your body ONLY FROM THE NECK DOWN.  Do not use on open wounds or open sores. Avoid contact with your eyes, ears, mouth and genitals (private parts). Wash Face and genitals (private parts)  with your normal soap.   Wash thoroughly, paying special attention to the area where your surgery will be performed.  Thoroughly rinse your body with warm water from the neck down.  DO NOT shower/wash with your normal soap after using and rinsing off the CHG Soap.  Pat yourself dry with a CLEAN TOWEL.  Wear CLEAN PAJAMAS to bed the night before surgery  Place CLEAN SHEETS on your bed the night before your surgery  DO NOT SLEEP WITH PETS.   Day of Surgery:  Take a shower with CHG soap. Wear Clean/Comfortable clothing the morning of surgery Do not apply any deodorants/lotions.   Remember to brush your teeth WITH YOUR REGULAR TOOTHPASTE.    COVID testing  If you are going to stay overnight or be admitted after your procedure/surgery and require a pre-op COVID test, please follow these instructions after your COVID test   You are not required to quarantine however you are required to wear a well-fitting mask when  you are out and around people not in your household.  If your mask becomes wet or soiled, replace with a new one.  Wash your hands often with soap and water for 20 seconds or clean your hands with an alcohol-based hand sanitizer that contains at least 60% alcohol.  Do not share personal items.  Notify your provider: if you are in close contact with someone who has COVID  or if you develop a fever of 100.4 or greater, sneezing, cough, sore throat, shortness of breath or body aches.    Please read over the following fact sheets that you were given.

## 2021-05-08 ENCOUNTER — Encounter (HOSPITAL_COMMUNITY)
Admission: RE | Admit: 2021-05-08 | Discharge: 2021-05-08 | Disposition: A | Discharge status: Home / Self Care | Payer: PPO | Source: Ambulatory Visit | Attending: Orthopaedic Surgery | Admitting: Orthopaedic Surgery

## 2021-05-08 ENCOUNTER — Other Ambulatory Visit: Payer: Self-pay

## 2021-05-08 ENCOUNTER — Encounter (HOSPITAL_COMMUNITY): Payer: Self-pay

## 2021-05-08 VITALS — BP 155/96 | HR 88 | Temp 98.2°F | Resp 17 | Ht 66.0 in | Wt 220.9 lb

## 2021-05-08 DIAGNOSIS — M1712 Unilateral primary osteoarthritis, left knee: Secondary | ICD-10-CM | POA: Diagnosis not present

## 2021-05-08 DIAGNOSIS — Z20822 Contact with and (suspected) exposure to covid-19: Secondary | ICD-10-CM | POA: Insufficient documentation

## 2021-05-08 DIAGNOSIS — Z01812 Encounter for preprocedural laboratory examination: Secondary | ICD-10-CM | POA: Insufficient documentation

## 2021-05-08 DIAGNOSIS — Z01818 Encounter for other preprocedural examination: Secondary | ICD-10-CM

## 2021-05-08 LAB — COMPREHENSIVE METABOLIC PANEL
ALT: 20 U/L (ref 0–44)
AST: 22 U/L (ref 15–41)
Albumin: 3.8 g/dL (ref 3.5–5.0)
Alkaline Phosphatase: 71 U/L (ref 38–126)
Anion gap: 8 (ref 5–15)
BUN: 11 mg/dL (ref 8–23)
CO2: 25 mmol/L (ref 22–32)
Calcium: 8.9 mg/dL (ref 8.9–10.3)
Chloride: 106 mmol/L (ref 98–111)
Creatinine, Ser: 0.79 mg/dL (ref 0.61–1.24)
GFR, Estimated: >60 mL/min (ref >60)
Glucose, Bld: 100 mg/dL — ABNORMAL HIGH (ref 70–99)
Potassium: 4.1 mmol/L (ref 3.5–5.1)
Sodium: 139 mmol/L (ref 135–145)
Total Bilirubin: 0.6 mg/dL (ref 0.3–1.2)
Total Protein: 7.3 g/dL (ref 6.5–8.1)

## 2021-05-08 LAB — CBC WITH DIFFERENTIAL/PLATELET
Abs Immature Granulocytes: 0.08 10*3/uL — ABNORMAL HIGH (ref 0.00–0.07)
Basophils Absolute: 0 10*3/uL (ref 0.0–0.1)
Basophils Relative: 0 %
Eosinophils Absolute: 0.3 10*3/uL (ref 0.0–0.5)
Eosinophils Relative: 2 %
HCT: 43.9 % (ref 39.0–52.0)
Hemoglobin: 14.4 g/dL (ref 13.0–17.0)
Immature Granulocytes: 1 %
Lymphocytes Relative: 24 %
Lymphs Abs: 2.7 10*3/uL (ref 0.7–4.0)
MCH: 30.1 pg (ref 26.0–34.0)
MCHC: 32.8 g/dL (ref 30.0–36.0)
MCV: 91.8 fL (ref 80.0–100.0)
Monocytes Absolute: 0.9 10*3/uL (ref 0.1–1.0)
Monocytes Relative: 8 %
Neutro Abs: 7.4 10*3/uL (ref 1.7–7.7)
Neutrophils Relative %: 65 %
Platelets: 301 10*3/uL (ref 150–400)
RBC: 4.78 MIL/uL (ref 4.22–5.81)
RDW: 12.6 % (ref 11.5–15.5)
WBC: 11.3 10*3/uL — ABNORMAL HIGH (ref 4.0–10.5)
nRBC: 0 % (ref 0.0–0.2)

## 2021-05-08 LAB — SARS CORONAVIRUS 2 (TAT 6-24 HRS): SARS Coronavirus 2: NEGATIVE

## 2021-05-08 LAB — SURGICAL PCR SCREEN
MRSA, PCR: NEGATIVE
Staphylococcus aureus: NEGATIVE

## 2021-05-08 NOTE — Progress Notes (Signed)
PCP - Dr. Ria Bush Cardiologist - patient denies  PPM/ICD - n/a Device Orders -  Rep Notified -   Chest x-ray - n/a EKG - 12/07/20 Stress Test - 20-25 years ago for "shortness of breath and burning" - stress test was ok, ended up being reflux  ECHO - patient denies Cardiac Cath - patient denies  Sleep Study - 01/2019 CPAP - yes, nightly  Fasting Blood Sugar - n/a Checks Blood Sugar _____ times a day  Blood Thinner Instructions: n/a Aspirin Instructions: only take MWF as preventive since he has a family hx of cardiac issues; hold 5-7 days  ERAS Protcol - clears until 0650 PRE-SURGERY Ensure or G2- Ensure given to be completed by 7253  COVID TEST- at PAT appointment   Anesthesia review: n/a  Patient denies shortness of breath, fever, cough and chest pain at PAT appointment   All instructions explained to the patient, with a verbal understanding of the material. Patient agrees to go over the instructions while at home for a better understanding. Patient also instructed to wear a mask after being tested for COVID-19. The opportunity to ask questions was provided.

## 2021-05-09 MED ORDER — TRANEXAMIC ACID 1000 MG/10ML IV SOLN
2000.0000 mg | INTRAVENOUS | Status: AC
  Filled 2021-05-09: qty 20

## 2021-05-12 ENCOUNTER — Observation Stay (HOSPITAL_COMMUNITY)
Admission: RE | Admit: 2021-05-12 | Discharge: 2021-05-13 | Disposition: A | Discharge status: Home Health | Payer: PPO | Attending: Orthopaedic Surgery | Admitting: Orthopaedic Surgery

## 2021-05-12 ENCOUNTER — Observation Stay (HOSPITAL_COMMUNITY): Payer: PPO

## 2021-05-12 ENCOUNTER — Ambulatory Visit (HOSPITAL_BASED_OUTPATIENT_CLINIC_OR_DEPARTMENT_OTHER): Payer: PPO | Admitting: Anesthesiology

## 2021-05-12 ENCOUNTER — Encounter (HOSPITAL_COMMUNITY)
Admission: RE | Disposition: A | Discharge status: Home Health | Payer: Self-pay | Source: Home / Self Care | Attending: Orthopaedic Surgery

## 2021-05-12 ENCOUNTER — Ambulatory Visit (HOSPITAL_COMMUNITY): Payer: PPO | Admitting: Physician Assistant

## 2021-05-12 ENCOUNTER — Other Ambulatory Visit: Payer: Self-pay

## 2021-05-12 ENCOUNTER — Encounter (HOSPITAL_COMMUNITY): Payer: Self-pay | Admitting: Orthopaedic Surgery

## 2021-05-12 DIAGNOSIS — M1712 Unilateral primary osteoarthritis, left knee: Principal | ICD-10-CM

## 2021-05-12 DIAGNOSIS — Z9989 Dependence on other enabling machines and devices: Secondary | ICD-10-CM

## 2021-05-12 DIAGNOSIS — Z96652 Presence of left artificial knee joint: Secondary | ICD-10-CM | POA: Diagnosis not present

## 2021-05-12 DIAGNOSIS — R569 Unspecified convulsions: Secondary | ICD-10-CM | POA: Insufficient documentation

## 2021-05-12 DIAGNOSIS — R52 Pain, unspecified: Secondary | ICD-10-CM

## 2021-05-12 DIAGNOSIS — G4730 Sleep apnea, unspecified: Secondary | ICD-10-CM | POA: Diagnosis not present

## 2021-05-12 DIAGNOSIS — Z8616 Personal history of COVID-19: Secondary | ICD-10-CM | POA: Diagnosis not present

## 2021-05-12 DIAGNOSIS — K219 Gastro-esophageal reflux disease without esophagitis: Secondary | ICD-10-CM | POA: Diagnosis not present

## 2021-05-12 DIAGNOSIS — G4733 Obstructive sleep apnea (adult) (pediatric): Secondary | ICD-10-CM | POA: Diagnosis not present

## 2021-05-12 DIAGNOSIS — G8918 Other acute postprocedural pain: Secondary | ICD-10-CM | POA: Diagnosis not present

## 2021-05-12 HISTORY — PX: TOTAL KNEE ARTHROPLASTY: SHX125

## 2021-05-12 SURGERY — ARTHROPLASTY, KNEE, TOTAL
Anesthesia: General | Site: Knee | Laterality: Left

## 2021-05-12 MED ORDER — VANCOMYCIN HCL 1000 MG IV SOLR
INTRAVENOUS | Status: DC | PRN
  Administered 2021-05-12: 1000 mg

## 2021-05-12 MED ORDER — CEFAZOLIN SODIUM-DEXTROSE 2-3 GM-%(50ML) IV SOLR
INTRAVENOUS | Status: DC | PRN
  Administered 2021-05-12: 2 g via INTRAVENOUS

## 2021-05-12 MED ORDER — EPHEDRINE SULFATE-NACL 50-0.9 MG/10ML-% IV SOSY
PREFILLED_SYRINGE | INTRAVENOUS | Status: DC | PRN
  Administered 2021-05-12 (×3): 5 mg via INTRAVENOUS

## 2021-05-12 MED ORDER — CEFAZOLIN SODIUM-DEXTROSE 2-4 GM/100ML-% IV SOLN
2.0000 g | INTRAVENOUS | Status: AC
  Filled 2021-05-12: qty 100

## 2021-05-12 MED ORDER — FENTANYL CITRATE (PF) 250 MCG/5ML IJ SOLN
INTRAMUSCULAR | Status: DC | PRN
  Administered 2021-05-12 (×3): 25 ug via INTRAVENOUS
  Administered 2021-05-12: 50 ug via INTRAVENOUS

## 2021-05-12 MED ORDER — ONDANSETRON HCL 4 MG/2ML IJ SOLN
INTRAMUSCULAR | Status: DC | PRN
  Administered 2021-05-12: 4 mg via INTRAVENOUS

## 2021-05-12 MED ORDER — LACTATED RINGERS IV SOLN: INTRAVENOUS | Status: DC

## 2021-05-12 MED ORDER — DEXAMETHASONE SODIUM PHOSPHATE 10 MG/ML IJ SOLN
INTRAMUSCULAR | Status: DC | PRN
  Administered 2021-05-12: 10 mg via INTRAVENOUS

## 2021-05-12 MED ORDER — FENTANYL CITRATE (PF) 250 MCG/5ML IJ SOLN
INTRAMUSCULAR | Status: AC
  Filled 2021-05-12: qty 5

## 2021-05-12 MED ORDER — ASPIRIN 81 MG PO CHEW
81.0000 mg | CHEWABLE_TABLET | Freq: Two times a day (BID) | ORAL | Status: DC
  Administered 2021-05-12 – 2021-05-13 (×2): 81 mg via ORAL
  Filled 2021-05-12 (×2): qty 1

## 2021-05-12 MED ORDER — FENTANYL CITRATE (PF) 100 MCG/2ML IJ SOLN
INTRAMUSCULAR | Status: AC
  Administered 2021-05-12: 100 ug via INTRAVENOUS
  Filled 2021-05-12: qty 2

## 2021-05-12 MED ORDER — METOCLOPRAMIDE HCL 5 MG PO TABS
5.0000 mg | ORAL_TABLET | Freq: Three times a day (TID) | ORAL | Status: DC | PRN
  Administered 2021-05-13: 10 mg via ORAL
  Filled 2021-05-12: qty 2

## 2021-05-12 MED ORDER — TRANEXAMIC ACID-NACL 1000-0.7 MG/100ML-% IV SOLN
INTRAVENOUS | Status: DC | PRN
  Administered 2021-05-12: 1000 mg via INTRAVENOUS

## 2021-05-12 MED ORDER — TRANEXAMIC ACID-NACL 1000-0.7 MG/100ML-% IV SOLN
1000.0000 mg | INTRAVENOUS | Status: DC
  Filled 2021-05-12: qty 100

## 2021-05-12 MED ORDER — MENTHOL 3 MG MT LOZG: 1.0000 | LOZENGE | OROMUCOSAL | Status: DC | PRN

## 2021-05-12 MED ORDER — OXYCODONE HCL 5 MG PO TABS
5.0000 mg | ORAL_TABLET | ORAL | Status: DC | PRN
  Administered 2021-05-12 – 2021-05-13 (×3): 10 mg via ORAL
  Filled 2021-05-12 (×3): qty 2

## 2021-05-12 MED ORDER — LACTATED RINGERS IV SOLN: INTRAVENOUS | Status: DC | PRN

## 2021-05-12 MED ORDER — DEXAMETHASONE SODIUM PHOSPHATE 10 MG/ML IJ SOLN
10.0000 mg | Freq: Once | INTRAMUSCULAR | Status: AC
  Administered 2021-05-13: 10 mg via INTRAVENOUS
  Filled 2021-05-12: qty 1

## 2021-05-12 MED ORDER — OXYCODONE HCL 5 MG PO TABS: 5.0000 mg | ORAL_TABLET | Freq: Once | ORAL | Status: DC | PRN

## 2021-05-12 MED ORDER — ONDANSETRON HCL 4 MG/2ML IJ SOLN: 4.0000 mg | Freq: Four times a day (QID) | INTRAMUSCULAR | Status: DC | PRN

## 2021-05-12 MED ORDER — PROPOFOL 10 MG/ML IV BOLUS
INTRAVENOUS | Status: DC | PRN
Start: 2021-05-12 — End: 2021-05-12
  Administered 2021-05-12: 100 mg via INTRAVENOUS

## 2021-05-12 MED ORDER — SODIUM CHLORIDE (PF) 0.9 % IJ SOLN
INTRAMUSCULAR | Status: DC | PRN
  Administered 2021-05-12: 60 mL

## 2021-05-12 MED ORDER — OXYCODONE HCL ER 10 MG PO T12A
10.0000 mg | EXTENDED_RELEASE_TABLET | Freq: Two times a day (BID) | ORAL | Status: DC
  Administered 2021-05-12 – 2021-05-13 (×3): 10 mg via ORAL
  Filled 2021-05-12 (×3): qty 1

## 2021-05-12 MED ORDER — ROPIVACAINE HCL 7.5 MG/ML IJ SOLN
INTRAMUSCULAR | Status: DC | PRN
  Administered 2021-05-12: 20 mL via PERINEURAL

## 2021-05-12 MED ORDER — ACETAMINOPHEN 325 MG PO TABS: 325.0000 mg | ORAL_TABLET | Freq: Four times a day (QID) | ORAL | Status: DC | PRN

## 2021-05-12 MED ORDER — POVIDONE-IODINE 10 % EX SWAB
2.0000 "application " | Freq: Once | CUTANEOUS | Status: AC
  Administered 2021-05-12: 2 via TOPICAL

## 2021-05-12 MED ORDER — CHLORHEXIDINE GLUCONATE 0.12 % MT SOLN
15.0000 mL | Freq: Once | OROMUCOSAL | Status: AC
  Administered 2021-05-12: 15 mL via OROMUCOSAL
  Filled 2021-05-12: qty 15

## 2021-05-12 MED ORDER — DOCUSATE SODIUM 100 MG PO CAPS
100.0000 mg | ORAL_CAPSULE | Freq: Two times a day (BID) | ORAL | Status: DC
  Administered 2021-05-12 – 2021-05-13 (×3): 100 mg via ORAL
  Filled 2021-05-12 (×3): qty 1

## 2021-05-12 MED ORDER — 0.9 % SODIUM CHLORIDE (POUR BTL) OPTIME
TOPICAL | Status: DC | PRN
  Administered 2021-05-12: 1000 mL

## 2021-05-12 MED ORDER — ONDANSETRON HCL 4 MG/2ML IJ SOLN
INTRAMUSCULAR | Status: AC
  Filled 2021-05-12: qty 4

## 2021-05-12 MED ORDER — EPHEDRINE 5 MG/ML INJ
INTRAVENOUS | Status: AC
  Filled 2021-05-12: qty 10

## 2021-05-12 MED ORDER — MIDAZOLAM HCL 2 MG/2ML IJ SOLN
INTRAMUSCULAR | Status: AC
  Administered 2021-05-12: 2 mg via INTRAVENOUS
  Filled 2021-05-12: qty 2

## 2021-05-12 MED ORDER — TRANEXAMIC ACID 1000 MG/10ML IV SOLN
INTRAVENOUS | Status: DC | PRN
  Administered 2021-05-12: 2000 mg via TOPICAL

## 2021-05-12 MED ORDER — HYDROMORPHONE HCL 1 MG/ML IJ SOLN: 0.5000 mg | INTRAMUSCULAR | Status: DC | PRN

## 2021-05-12 MED ORDER — CEFAZOLIN SODIUM-DEXTROSE 2-4 GM/100ML-% IV SOLN
2.0000 g | Freq: Four times a day (QID) | INTRAVENOUS | Status: AC
  Administered 2021-05-12 (×2): 2 g via INTRAVENOUS
  Filled 2021-05-12 (×2): qty 100

## 2021-05-12 MED ORDER — FENTANYL CITRATE (PF) 100 MCG/2ML IJ SOLN: 100.0000 ug | Freq: Once | INTRAMUSCULAR | Status: AC

## 2021-05-12 MED ORDER — MEPERIDINE HCL 25 MG/ML IJ SOLN: 6.2500 mg | INTRAMUSCULAR | Status: DC | PRN

## 2021-05-12 MED ORDER — MIDAZOLAM HCL 2 MG/2ML IJ SOLN: 0.5000 mg | Freq: Once | INTRAMUSCULAR | Status: DC | PRN

## 2021-05-12 MED ORDER — BUPIVACAINE-MELOXICAM ER 400-12 MG/14ML IJ SOLN
INTRAMUSCULAR | Status: AC
  Filled 2021-05-12: qty 1

## 2021-05-12 MED ORDER — OXYCODONE HCL 5 MG/5ML PO SOLN: 5.0000 mg | Freq: Once | ORAL | Status: DC | PRN

## 2021-05-12 MED ORDER — PHENYLEPHRINE 40 MCG/ML (10ML) SYRINGE FOR IV PUSH (FOR BLOOD PRESSURE SUPPORT)
PREFILLED_SYRINGE | INTRAVENOUS | Status: DC | PRN
  Administered 2021-05-12: 80 ug via INTRAVENOUS

## 2021-05-12 MED ORDER — BUPIVACAINE IN DEXTROSE 0.75-8.25 % IT SOLN
INTRATHECAL | Status: DC | PRN
  Administered 2021-05-12: 15 mg via INTRATHECAL

## 2021-05-12 MED ORDER — MIDAZOLAM HCL 2 MG/2ML IJ SOLN: 2.0000 mg | Freq: Once | INTRAMUSCULAR | Status: AC

## 2021-05-12 MED ORDER — ONDANSETRON HCL 4 MG PO TABS: 4.0000 mg | ORAL_TABLET | Freq: Four times a day (QID) | ORAL | Status: DC | PRN

## 2021-05-12 MED ORDER — ORAL CARE MOUTH RINSE: 15.0000 mL | Freq: Once | OROMUCOSAL | Status: AC

## 2021-05-12 MED ORDER — SODIUM CHLORIDE 0.9 % IR SOLN
Status: DC | PRN
  Administered 2021-05-12: 1000 mL

## 2021-05-12 MED ORDER — METOCLOPRAMIDE HCL 5 MG/ML IJ SOLN
5.0000 mg | Freq: Three times a day (TID) | INTRAMUSCULAR | Status: DC | PRN
  Administered 2021-05-12: 10 mg via INTRAVENOUS
  Filled 2021-05-12: qty 2

## 2021-05-12 MED ORDER — SODIUM CHLORIDE 0.9 % IV SOLN: INTRAVENOUS | Status: DC

## 2021-05-12 MED ORDER — BUPIVACAINE-EPINEPHRINE (PF) 0.25% -1:200000 IJ SOLN
INTRAMUSCULAR | Status: AC
  Filled 2021-05-12: qty 30

## 2021-05-12 MED ORDER — ACETAMINOPHEN 500 MG PO TABS
1000.0000 mg | ORAL_TABLET | Freq: Four times a day (QID) | ORAL | Status: AC
  Administered 2021-05-12 – 2021-05-13 (×4): 1000 mg via ORAL
  Filled 2021-05-12 (×4): qty 2

## 2021-05-12 MED ORDER — PHENOL 1.4 % MT LIQD: 1.0000 | OROMUCOSAL | Status: DC | PRN

## 2021-05-12 MED ORDER — ACETAMINOPHEN 500 MG PO TABS
1000.0000 mg | ORAL_TABLET | Freq: Once | ORAL | Status: AC
  Administered 2021-05-12: 1000 mg via ORAL
  Filled 2021-05-12: qty 2

## 2021-05-12 MED ORDER — TRANEXAMIC ACID-NACL 1000-0.7 MG/100ML-% IV SOLN
1000.0000 mg | Freq: Once | INTRAVENOUS | Status: AC
  Administered 2021-05-12: 1000 mg via INTRAVENOUS
  Filled 2021-05-12: qty 100

## 2021-05-12 MED ORDER — DEXAMETHASONE SODIUM PHOSPHATE 10 MG/ML IJ SOLN
INTRAMUSCULAR | Status: AC
  Filled 2021-05-12: qty 1

## 2021-05-12 MED ORDER — BUPIVACAINE LIPOSOME 1.3 % IJ SUSP
INTRAMUSCULAR | Status: AC
  Filled 2021-05-12: qty 20

## 2021-05-12 MED ORDER — OXYCODONE HCL 5 MG PO TABS: 10.0000 mg | ORAL_TABLET | ORAL | Status: DC | PRN

## 2021-05-12 MED ORDER — METHOCARBAMOL 1000 MG/10ML IJ SOLN
500.0000 mg | Freq: Four times a day (QID) | INTRAVENOUS | Status: DC | PRN
  Filled 2021-05-12: qty 5

## 2021-05-12 MED ORDER — PROPOFOL 500 MG/50ML IV EMUL
INTRAVENOUS | Status: DC | PRN
  Administered 2021-05-12: 100 ug/kg/min via INTRAVENOUS

## 2021-05-12 MED ORDER — VANCOMYCIN HCL 1000 MG IV SOLR
INTRAVENOUS | Status: AC
  Filled 2021-05-12: qty 20

## 2021-05-12 MED ORDER — HYDROMORPHONE HCL 1 MG/ML IJ SOLN: 0.2500 mg | INTRAMUSCULAR | Status: DC | PRN

## 2021-05-12 MED ORDER — METHOCARBAMOL 500 MG PO TABS
500.0000 mg | ORAL_TABLET | Freq: Four times a day (QID) | ORAL | Status: DC | PRN
  Administered 2021-05-12 – 2021-05-13 (×3): 500 mg via ORAL
  Filled 2021-05-12 (×3): qty 1

## 2021-05-12 SURGICAL SUPPLY — 76 items
ALCOHOL 70% 16 OZ (MISCELLANEOUS) ×2 IMPLANT
BAG COUNTER SPONGE SURGICOUNT (BAG) IMPLANT
BAG DECANTER FOR FLEXI CONT (MISCELLANEOUS) ×2 IMPLANT
BLADE SAG 18X100X1.27 (BLADE) ×2 IMPLANT
BLADE SAGITTAL 13X1.27X60 (BLADE) IMPLANT
BOWL SMART MIX CTS (DISPOSABLE) ×2 IMPLANT
CLSR STERI-STRIP ANTIMIC 1/2X4 (GAUZE/BANDAGES/DRESSINGS) ×2 IMPLANT
COMP FEM PS 9 LT STD (Joint) ×2 IMPLANT
COMP PATELLAR 10X35 METAL (Joint) ×2 IMPLANT
COMPONENT FEM PS 9 LT STD (Joint) IMPLANT
COMPONENT PATELLAR 10X35 METAL (Joint) IMPLANT
COOLER ICEMAN CLASSIC (MISCELLANEOUS) ×2 IMPLANT
COVER SURGICAL LIGHT HANDLE (MISCELLANEOUS) ×2 IMPLANT
CUFF TOURN SGL QUICK 34 (TOURNIQUET CUFF) ×2
CUFF TOURN SGL QUICK 42 (TOURNIQUET CUFF) IMPLANT
CUFF TRNQT CYL 34X4.125X (TOURNIQUET CUFF) ×1 IMPLANT
DERMABOND ADHESIVE PROPEN (GAUZE/BANDAGES/DRESSINGS) ×2
DERMABOND ADVANCED (GAUZE/BANDAGES/DRESSINGS) ×1
DERMABOND ADVANCED .7 DNX12 (GAUZE/BANDAGES/DRESSINGS) ×1 IMPLANT
DERMABOND ADVANCED .7 DNX6 (GAUZE/BANDAGES/DRESSINGS) IMPLANT
DRAPE EXTREMITY T 121X128X90 (DISPOSABLE) ×2 IMPLANT
DRAPE HALF SHEET 40X57 (DRAPES) ×2 IMPLANT
DRAPE INCISE IOBAN 66X45 STRL (DRAPES) ×2 IMPLANT
DRAPE ORTHO SPLIT 77X108 STRL (DRAPES) ×4
DRAPE POUCH INSTRU U-SHP 10X18 (DRAPES) ×2 IMPLANT
DRAPE SURG ORHT 6 SPLT 77X108 (DRAPES) ×2 IMPLANT
DRAPE U-SHAPE 47X51 STRL (DRAPES) ×4 IMPLANT
DRSG AQUACEL AG ADV 3.5X10 (GAUZE/BANDAGES/DRESSINGS) ×1 IMPLANT
DURAPREP 26ML APPLICATOR (WOUND CARE) ×6 IMPLANT
ELECT CAUTERY BLADE 6.4 (BLADE) ×2 IMPLANT
ELECT REM PT RETURN 9FT ADLT (ELECTROSURGICAL) ×2
ELECTRODE REM PT RTRN 9FT ADLT (ELECTROSURGICAL) ×1 IMPLANT
GLOVE BIOGEL PI IND STRL 7.5 (GLOVE) ×4 IMPLANT
GLOVE BIOGEL PI INDICATOR 7.5 (GLOVE) ×4
GLOVE SURG UNDER LTX SZ7.5 (GLOVE) ×4 IMPLANT
GLOVE SURG UNDER POLY LF SZ7 (GLOVE) ×6 IMPLANT
GOWN STRL REIN XL XLG (GOWN DISPOSABLE) ×2 IMPLANT
GOWN STRL REUS W/ TWL LRG LVL3 (GOWN DISPOSABLE) ×1 IMPLANT
GOWN STRL REUS W/TWL LRG LVL3 (GOWN DISPOSABLE) ×2
HANDPIECE INTERPULSE COAX TIP (DISPOSABLE) ×2
HDLS TROCR DRIL PIN KNEE 75 (PIN) ×2
HOOD PEEL AWAY FLYTE STAYCOOL (MISCELLANEOUS) ×4 IMPLANT
INSERT ASF POLY 14 LT (Insert) ×1 IMPLANT
JET LAVAGE IRRISEPT WOUND (IRRIGATION / IRRIGATOR) ×2
KIT BASIN OR (CUSTOM PROCEDURE TRAY) ×2 IMPLANT
KIT TURNOVER KIT B (KITS) ×2 IMPLANT
LAVAGE JET IRRISEPT WOUND (IRRIGATION / IRRIGATOR) ×1 IMPLANT
MANIFOLD NEPTUNE II (INSTRUMENTS) ×2 IMPLANT
MARKER SKIN DUAL TIP RULER LAB (MISCELLANEOUS) ×4 IMPLANT
NDL SPNL 18GX3.5 QUINCKE PK (NEEDLE) ×1 IMPLANT
NEEDLE SPNL 18GX3.5 QUINCKE PK (NEEDLE) ×2 IMPLANT
NS IRRIG 1000ML POUR BTL (IV SOLUTION) ×2 IMPLANT
PACK TOTAL JOINT (CUSTOM PROCEDURE TRAY) ×2 IMPLANT
PAD ARMBOARD 7.5X6 YLW CONV (MISCELLANEOUS) ×4 IMPLANT
PAD COLD SHLDR WRAP-ON (PAD) ×2 IMPLANT
PIN DRILL HDLS TROCAR 75 4PK (PIN) IMPLANT
SAW OSC TIP CART 19.5X105X1.3 (SAW) ×2 IMPLANT
SCREW FEMALE HEX FIX 25X2.5 (ORTHOPEDIC DISPOSABLE SUPPLIES) ×1 IMPLANT
SET HNDPC FAN SPRY TIP SCT (DISPOSABLE) ×1 IMPLANT
STAPLER VISISTAT 35W (STAPLE) IMPLANT
STEM TIBIAL TRAB SZF LT (Stem) ×1 IMPLANT
SUCTION FRAZIER HANDLE 10FR (MISCELLANEOUS) ×2
SUCTION TUBE FRAZIER 10FR DISP (MISCELLANEOUS) ×1 IMPLANT
SUT ETHILON 2 0 FS 18 (SUTURE) IMPLANT
SUT MNCRL AB 3-0 PS2 27 (SUTURE) IMPLANT
SUT VIC AB 0 CT1 27 (SUTURE) ×4
SUT VIC AB 0 CT1 27XBRD ANBCTR (SUTURE) ×2 IMPLANT
SUT VIC AB 1 CTX 27 (SUTURE) ×6 IMPLANT
SUT VIC AB 2-0 CT1 27 (SUTURE) ×8
SUT VIC AB 2-0 CT1 TAPERPNT 27 (SUTURE) ×4 IMPLANT
SYR 50ML LL SCALE MARK (SYRINGE) ×4 IMPLANT
TOWEL GREEN STERILE (TOWEL DISPOSABLE) ×2 IMPLANT
TOWEL GREEN STERILE FF (TOWEL DISPOSABLE) ×2 IMPLANT
TRAY CATH 16FR W/PLASTIC CATH (SET/KITS/TRAYS/PACK) ×1 IMPLANT
UNDERPAD 30X36 HEAVY ABSORB (UNDERPADS AND DIAPERS) ×2 IMPLANT
YANKAUER SUCT BULB TIP NO VENT (SUCTIONS) ×4 IMPLANT

## 2021-05-12 NOTE — Anesthesia Procedure Notes (Signed)
Anesthesia Regional Block: Adductor canal block   Pre-Anesthetic Checklist: , timeout performed,  Correct Patient, Correct Site, Correct Laterality,  Correct Procedure, Correct Position, site marked,  Risks and benefits discussed,  Surgical consent,  Pre-op evaluation,  At surgeon's request and post-op pain management  Laterality: Left and Lower  Prep: chloraprep       Needles:  Injection technique: Single-shot  Needle Type: Echogenic Needle     Needle Length: 9cm  Needle Gauge: 21     Additional Needles:   Procedures:,,,, ultrasound used (permanent image in chart),,    Narrative:  Start time: 05/12/2021 8:41 AM End time: 05/12/2021 8:47 AM Injection made incrementally with aspirations every 5 mL.  Performed by: Personally  Anesthesiologist: Annye Asa, MD  Additional Notes: Pt identified in Holding room.  Monitors applied. Working IV access confirmed. Sterile prep L thigh.  #21ga ECHOgenic Arrow block needle into adductor canal with US guidance.  20cc 0.75% Ropivacaine injected incrementally after negative test dose.  Patient asymptomatic, VSS, no heme aspirated, tolerated well.   Jenita Seashore, MD

## 2021-05-12 NOTE — Anesthesia Procedure Notes (Signed)
Spinal  Patient location during procedure: OR Start time: 05/12/2021 9:53 AM End time: 05/12/2021 9:59 AM Reason for block: surgical anesthesia Staffing Performed: anesthesiologist  Anesthesiologist: Annye Asa, MD Preanesthetic Checklist Completed: patient identified, IV checked, site marked, risks and benefits discussed, surgical consent, monitors and equipment checked, pre-op evaluation and timeout performed Spinal Block Patient position: sitting Prep: DuraPrep and site prepped and draped Patient monitoring: blood pressure, continuous pulse ox, cardiac monitor and heart rate Approach: midline Location: L3-4 Injection technique: single-shot Needle Needle type: Pencan and Introducer  Needle length: 9 cm Assessment Events: CSF return Additional Notes Pt identified in Operating room.  Monitors applied. Working IV access confirmed. Sterile prep, drape lumbar spine.  1% lido local L 3,4.  #24ga Pencan into clear CSF L 3,4.  15mg  0.75% Bupivacaine with dextrose injected with asp CSF beginning and end of injection.  Patient asymptomatic, VSS, no heme aspirated, tolerated well.  Jenita Seashore, MD

## 2021-05-12 NOTE — Op Note (Signed)
Total Knee Arthroplasty Procedure Note  Preoperative diagnosis: Left knee osteoarthritis  Postoperative diagnosis:same  Operative procedure: Left total knee arthroplasty. CPT [phone]  Surgeon: N. Eduard Roux, MD  Assist: Madalyn Rob, PA-C; necessary for the timely completion of procedure and due to complexity of procedure.  Anesthesia: Spinal, regional  Tourniquet time: 48 mins  Implants used: Zimmer persona pressfit Femur: CR 9 Tibia: F Patella: 35 mm Polyethylene: 14 mm, MC  Indication: Ricky Barton is a 66 y.o. year old male with a history of knee pain. Having failed conservative management, the patient elected to proceed with a total knee arthroplasty.  We have reviewed the risk and benefits of the surgery and they elected to proceed after voicing understanding.  Procedure:  After informed consent was obtained and understanding of the risk were voiced including but not limited to bleeding, infection, damage to surrounding structures including nerves and vessels, blood clots, leg length inequality and the failure to achieve desired results, the operative extremity was marked with verbal confirmation of the patient in the holding area.   The patient was then brought to the operating room and transported to the operating room table in the supine position.  A tourniquet was applied to the operative extremity around the upper thigh. The operative limb was then prepped and draped in the usual sterile fashion and preoperative antibiotics were administered.  A time out was performed prior to the start of surgery confirming the correct extremity, preoperative antibiotic administration, as well as team members, implants and instruments available for the case. Correct surgical site was also confirmed with preoperative radiographs. The limb was then elevated for exsanguination and the tourniquet was inflated. A midline incision was made and a standard medial parapatellar approach  was performed.  The patella was prepared and sized to a 35 mm.  A cover was placed on the patella for protection from retractors.  We then turned our attention to the femur. Posterior cruciate ligament was sacrificed. Start site was drilled in the femur and the intramedullary distal femoral cutting guide was placed, set at 5 degrees valgus, taking 10 mm of distal resection. The distal cut was made. Osteophytes were then removed.   Next, the proximal tibial cutting guide was placed with appropriate slope, varus/valgus alignment and depth of resection. The proximal tibial cut was made below the medial osteophyte and sclerotic bone. Gap blocks were then used to assess the extension gap and alignment, and appropriate soft tissue releases were performed. Attention was turned back to the femur, which was sized using the sizing guide to a size 9. Appropriate rotation of the femoral component was determined using epicondylar axis, Whitesides line, and assessing the flexion gap under ligament tension. The appropriate size 4-in-1 cutting block was placed and cuts were made.  Posterior femoral osteophytes and uncapped bone were then removed with the curved osteotome.  Trial components were placed, and stability was checked in full extension, mid-flexion, and deep flexion. Proper tibial rotation was determined and marked.  The patella tracked well without a lateral release. Trial components were then removed and tibial preparation performed.  The tibia was sized for a size F component.  The bony surfaces were irrigated with a pulse lavage and then dried. The stability of the construct was re-evaluated throughout a range of motion and found to be acceptable. The trial liner was removed, the knee was copiously irrigated, and the knee was re-evaluated for any excess bone debris. The real polyethylene liner, 14 mm thick,  was inserted and checked to ensure the locking mechanism had engaged appropriately. The tourniquet was  deflated and hemostasis was achieved. The wound was irrigated with normal saline.  One gram of vancomycin powder was placed in the surgical bed.  Topical 0.25% bupivacaine and meloxicam was placed in the joint for postoperative pain.  Capsular closure was performed with a #1 vicryl, subcutaneous fat closed with a 0 vicryl suture, then subcutaneous tissue closed with interrupted 2.0 vicryl suture. The skin was then closed with a 2.0 nylon and dermabond1. A sterile dressing was applied.  The patient was awakened in the operating room and taken to recovery in stable condition. All sponge, needle, and instrument counts were correct at the end of the case.  [name] was necessary for opening, closing, retracting, limb positioning and overall facilitation and completion of the surgery.  Position: supine  Complications: none.  Time Out: performed   Drains/Packing: none  Estimated blood loss: minimal  Returned to Recovery Room: in good condition.   Antibiotics: yes   Mechanical VTE (DVT) Prophylaxis: sequential compression devices, TED thigh-high  Chemical VTE (DVT) Prophylaxis: aspirin  Fluid Replacement  Crystalloid: see anesthesia record Blood: none  FFP: none   Specimens Removed: 1 to pathology   Sponge and Instrument Count Correct? yes   PACU: portable radiograph - knee AP and Lateral   Plan/RTC: Return in 2 weeks for wound check.   Weight Bearing/Load Lower Extremity: full   Implant Name Type Inv. Item Serial No. Manufacturer Lot No. LRB No. Used Action  STEM TIBIAL TRAB SZF LT - [id number] Stem STEM TIBIAL TRAB SZF LT  [employer](ORTH,TRAU,BIO,SG) [id number] Left 1 Implanted  COMP FEM PS 9 LT STD - [id number] Joint COMP FEM PS 9 LT STD  [employer]ON(ORTH,TRAU,BIO,SG) [id number] Left 1 Implanted  COMP PATELLAR 10X35 METAL - [id number] Joint COMP PATELLAR 10X35 METAL  [employer]ON(ORTH,TRAU,BIO,SG) [id number] Left 1 Implanted  INSERT ASF POLY 14 LT - [id number] Insert INSERT ASF  POLY 14 LT  ZIMMER [employer]ON(ORTH,TRAU,BIO,SG) [id number] Left 1 Implanted    N. Eduard Roux, MD [facility] 11:21 AM

## 2021-05-12 NOTE — Anesthesia Preprocedure Evaluation (Addendum)
Anesthesia Evaluation  Patient identified by MRN, date of birth, ID band Patient awake    Reviewed: Allergy & Precautions, NPO status , Patient's Chart, lab work & pertinent test results  History of Anesthesia Complications Negative for: history of anesthetic complications  Airway Mallampati: II  TM Distance: >3 FB Neck ROM: Full    Dental  (+) Dental Advisory Given   Pulmonary sleep apnea and Continuous Positive Airway Pressure Ventilation ,  05/08/2021 SARS coronavirus NEG   breath sounds clear to auscultation       Cardiovascular (-) anginanegative cardio ROS   Rhythm:Regular Rate:Normal     Neuro/Psych Seizures -, Well Controlled,  negative psych ROS   GI/Hepatic negative GI ROS, Neg liver ROS, neg GERD  ,  Endo/Other  Morbid obesity  Renal/GU negative Renal ROS     Musculoskeletal  (+) Arthritis , Osteoarthritis,    Abdominal (+) + obese,   Peds  Hematology negative hematology ROS (+)   Anesthesia Other Findings   Reproductive/Obstetrics                            Anesthesia Physical Anesthesia Plan  ASA: 3  Anesthesia Plan: Spinal   Post-op Pain Management: Regional block* and Tylenol PO (pre-op)*   Induction:   PONV Risk Score and Plan: 1 and Ondansetron and Treatment may vary due to age or medical condition  Airway Management Planned: Natural Airway and Simple Face Mask  Additional Equipment: None  Intra-op Plan:   Post-operative Plan:   Informed Consent: I have reviewed the patients History and Physical, chart, labs and discussed the procedure including the risks, benefits and alternatives for the proposed anesthesia with the patient or authorized representative who has indicated his/her understanding and acceptance.     Dental advisory given  Plan Discussed with: CRNA and Surgeon  Anesthesia Plan Comments: (Plan routine monitors, SAB with adductor canal block  for post op analgesia)       Anesthesia Quick Evaluation

## 2021-05-12 NOTE — Anesthesia Procedure Notes (Signed)
Procedure Name: LMA Insertion Date/Time: 05/12/2021 10:24 AM Performed by: Thelma Comp, CRNA Pre-anesthesia Checklist: Patient identified, Emergency Drugs available, Suction available and Patient being monitored Patient Re-evaluated:Patient Re-evaluated prior to induction Oxygen Delivery Method: Circle System Utilized Preoxygenation: Pre-oxygenation with 100% oxygen Induction Type: IV induction Ventilation: Mask ventilation without difficulty LMA: LMA inserted LMA Size: 5.0 Number of attempts: 1 Placement Confirmation: positive ETCO2 Tube secured with: Tape Dental Injury: Teeth and Oropharynx as per pre-operative assessment

## 2021-05-12 NOTE — Addendum Note (Signed)
Addendum  created 05/12/21 1518 by Annye Asa, MD   Clinical Note Signed, SmartForm saved

## 2021-05-12 NOTE — H&P (Signed)
PREOPERATIVE H&P  Chief Complaint: left knee osteoarthritis  HPI: [name] is a 66 y.o. male who presents for surgical treatment of left knee osteoarthritis.  He denies any changes in medical history.  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   COLONOSCOPY  07/2015   WNL, rpt 5 yrs Fuller Plan)   Head MRI  06/02   POLYPECTOMY     Social History   Socioeconomic History   Marital status: Married    Spouse name: Not on file   Number of children: 2   Years of education: Not on file   Highest education level: Not on file  Occupational History   Occupation: Route Social research officer, government: [employer]    Comment: [employer]  Tobacco Use   Smoking status: Never   Smokeless tobacco: Never  Vaping Use   Vaping Use: Never used  Substance and Sexual Activity   Alcohol use: No   Drug use: No   Sexual activity: Yes  Other Topics Concern   Not on file  Social History Narrative   Caffeine: occasional coffee/tea   Married and lives with wife, grown children, 2 dogs   Occupation: [employer]   Activity: no regular exercise, some golfing   Diet: good water, fruits/vegetables daily   Social Determinants of Radio broadcast assistant Strain: Not on file  Food Insecurity: Not on file  Transportation Needs: Not on file  Physical Activity: Not on file  Stress: Not on file  Social Connections: Not on file   Family History  Problem Relation Age of Onset   Hypertension Mother    Colon polyps Mother    Cancer Father 80       stomach cancer   Hyperlipidemia Brother    CAD  Brother 63       MI (smoker)   Hyperlipidemia Brother    Sleep apnea Brother    Pancreatic cancer Brother 68   Hyperlipidemia Brother    CAD Brother 36       MI (smoker)   Hyperlipidemia Brother    Multiple sclerosis Brother    Colon cancer Neg Hx    Esophageal cancer Neg Hx    Rectal cancer Neg Hx    Allergies  Allergen Reactions   Piroxicam Rash    blisters   Prior to Admission medications   Medication Sig Start Date End Date Taking? Authorizing Provider  aspirin EC 81 MG tablet Take 1 tablet (81 mg total) by mouth 2 (two) times daily. To be taken after surgery to prevent blood clots 05/05/21  Yes Aundra Dubin, PA-C  Boswellia-Glucosamine-Vit D (OSTEO BI-FLEX ONE PER DAY PO) Take 2 tablets by mouth daily.   Yes [provider]  cetirizine (ZYRTEC) 10 MG tablet TAKE 1 TABLET BY MOUTH EVERY DAY Patient taking differently: Take 10 mg by mouth daily as needed for allergies. 07/11/20  Yes Flora Lipps, MD  cyanocobalamin 1000 MCG tablet Take 1,000 mcg by mouth daily. Vit b12   Yes  [provider]  docusate sodium (COLACE) 100 MG capsule Take 1 capsule (100 mg total) by mouth daily as needed. Patient not taking: Reported on 05/05/2021 05/05/21 05/05/22  Aundra Dubin, PA-C  ibuprofen (ADVIL,MOTRIN) 200 MG tablet Take 400-600 mg by mouth daily as needed for mild pain, moderate pain or headache.   Yes [provider]  methocarbamol (ROBAXIN) 500 MG tablet Take 1 tablet (500 mg total) by mouth 2 (two) times daily as needed. 05/05/21   Aundra Dubin, PA-C  Omega-3 Fatty Acids (FISH OIL) 1000 MG CAPS Take 1-2 capsules (1,000-2,000 mg total) by mouth daily. Patient taking differently: Take 1,200 mg by mouth daily. 11/14/19  Yes Ria Bush, MD  ondansetron (ZOFRAN) 4 MG tablet Take 1 tablet (4 mg total) by mouth every 8 (eight) hours as needed for nausea or vomiting. 05/05/21   Aundra Dubin, PA-C  oxyCODONE-acetaminophen (PERCOCET) 5-325 MG tablet Take  1-2 tablets by mouth every 6 (six) hours as needed. To be taken after surgery 05/05/21   Aundra Dubin, PA-C  pravastatin (PRAVACHOL) 20 MG tablet Take 1 tablet (20 mg total) by mouth daily. 12/06/20  Yes Ria Bush, MD  atorvastatin (LIPITOR) 10 MG tablet TAKE 1 TABLET BY MOUTH EVERY DAY Patient not taking: Reported on 12/09/2020 09/23/20   Ria Bush, MD  sildenafil (VIAGRA) 100 MG tablet TAKE 1/2 TO 1 TABLET BY MOUTH 1 HOUR PRIOR AS NEEDED 04/05/14   Ria Bush, MD     Positive ROS: All other systems have been reviewed and were otherwise negative with the exception of those mentioned in the HPI and as above.  Physical Exam: General: Alert, no acute distress Cardiovascular: No pedal edema Respiratory: No cyanosis, no use of accessory musculature GI: abdomen soft Skin: No lesions in the area of chief complaint Neurologic: Sensation intact distally Psychiatric: Patient is competent for consent with normal mood and affect Lymphatic: no lymphedema  MUSCULOSKELETAL: exam stable  Assessment: left knee osteoarthritis  Plan: Plan for Procedure(s): LEFT TOTAL KNEE ARTHROPLASTY  The risks benefits and alternatives were discussed with the patient including but not limited to the risks of nonoperative treatment, versus surgical intervention including infection, bleeding, nerve injury,  blood clots, cardiopulmonary complications, morbidity, mortality, among others, and they were willing to proceed.   Preoperative templating of the joint replacement has been completed, documented, and submitted to the Operating Room personnel in order to optimize intra-operative equipment management.   Eduard Roux, MD 05/12/2021 8:54 AM

## 2021-05-12 NOTE — Discharge Instructions (Signed)

## 2021-05-12 NOTE — Evaluation (Signed)
Physical Therapy Evaluation Patient Details Name: Ricky Barton MRN: [id number] DOB: 14-Aug-1955 Today's Date: 05/12/2021  History of Present Illness  Pt is 66 yo male s/p L TKA on 05/12/21 .  Pt with hx including arthritis, hypoglycemic syndrome, L ear hearing loss, OSA, isolated seizure.  Clinical Impression  Pt is s/p TKA resulting in the deficits listed below (see PT Problem List). At baseline, pt is independent without AD and enjoys playing golf.  Today, pt with good quad activation but reports still feels weak in L LE.  He was able to transfer and ambulate a few steps with min A.  Pt expected to progress well.  Pt will benefit from skilled PT to increase their independence and safety with mobility to allow discharge to the venue listed below.          Recommendations for follow up therapy are one component of a multi-disciplinary discharge planning process, led by the attending physician.  Recommendations may be updated based on patient status, additional functional criteria and insurance authorization.  Follow Up Recommendations Follow physician's recommendations for discharge plan and follow up therapies    Assistance Recommended at Discharge Frequent or constant Supervision/Assistance  Patient can return home with the following  A little help with walking and/or transfers;A little help with bathing/dressing/bathroom;Help with stairs or ramp for entrance;Assistance with cooking/housework    Equipment Recommendations None recommended by PT  Recommendations for Other Services       Functional Status Assessment Patient has had a recent decline in their functional status and demonstrates the ability to make significant improvements in function in a reasonable and predictable amount of time.     Precautions / Restrictions Precautions Precautions: Fall Restrictions Weight Bearing Restrictions: Yes LLE Weight Bearing: Weight bearing as tolerated      Mobility  Bed  Mobility Overal bed mobility: Needs Assistance Bed Mobility: Supine to Sit     Supine to sit: Min assist     General bed mobility comments: Cues and min A for L LE    Transfers Overall transfer level: Needs assistance Equipment used: Rolling walker (2 wheels) Transfers: Sit to/from Stand Sit to Stand: Min assist           General transfer comment: Cues for hand placement and L LE management    Ambulation/Gait Ambulation/Gait assistance: Min guard Gait Distance (Feet): 5 Feet Assistive device: Rolling walker (2 wheels) Gait Pattern/deviations: Step-to pattern, Decreased stride length, Shuffle, Decreased stance time - left Gait velocity: decreased     General Gait Details: Cues for sequencing and RW use; min guard for safety; limited due to pain and weakness - only to chair  Stairs            Wheelchair Mobility    Modified Rankin (Stroke Patients Only)       Balance Overall balance assessment: Needs assistance Sitting-balance support: No upper extremity supported Sitting balance-Leahy Scale: Good     Standing balance support: Bilateral upper extremity supported, Reliant on assistive device for balance Standing balance-Leahy Scale: Poor Standing balance comment: requiring RW                             Pertinent Vitals/Pain Pain Assessment Pain Assessment: Faces Faces Pain Scale: Hurts little more Pain Location: L knee Pain Descriptors / Indicators: Discomfort, Sore Pain Intervention(s): Limited activity within patient's tolerance, Monitored during session, Ice applied    Home Living Family/patient expects to be discharged to::  Private residence Living Arrangements: Spouse/significant other Available Help at Discharge: Family;Available 24 hours/day Type of Home: House Home Access: Stairs to enter Entrance Stairs-Rails: None Entrance Stairs-Number of Steps: 2   Home Layout: One level Home Equipment: Conservation officer, nature (2 wheels);Shower  seat - built in      Prior Function Prior Level of Function : Independent/Modified Independent;Driving             Mobility Comments: Could ambulate in community ADLs Comments: Independent ADLS and IADLs     Hand Dominance        Extremity/Trunk Assessment   Upper Extremity Assessment Upper Extremity Assessment: Overall WFL for tasks assessed    Lower Extremity Assessment Lower Extremity Assessment: LLE deficits/detail;RLE deficits/detail RLE Deficits / Details: ROM WFL; MMT 5/5 RLE Sensation: WNL LLE Deficits / Details: ROM: WFL hip and ankle, knee 5 to 70 degrees; MMT: ankle 5/5, hip 2/5, knee 2/5 LLE Sensation: WNL    Cervical / Trunk Assessment Cervical / Trunk Assessment: Normal  Communication   Communication: No difficulties  Cognition Arousal/Alertness: Awake/alert Behavior During Therapy: WFL for tasks assessed/performed Overall Cognitive Status: Within Functional Limits for tasks assessed                                          General Comments General comments (skin integrity, edema, etc.): VSS; educated on PT role, POC, resting with leg straight, WBAT, performing ankle pumps and quad sets tonight, safe ice use    Exercises Total Joint Exercises Ankle Circles/Pumps: AROM, Both, 5 reps, Supine Quad Sets: AROM, Both, 5 reps, Supine   Assessment/Plan    PT Assessment Patient needs continued PT services  PT Problem List Decreased mobility;Decreased strength;Decreased safety awareness;Decreased range of motion;Decreased activity tolerance;Decreased balance;Decreased knowledge of use of DME       PT Treatment Interventions DME instruction;Therapeutic activities;Modalities;Gait training;Therapeutic exercise;Patient/family education;Stair training;Balance training;Functional mobility training    PT Goals (Current goals can be found in the Care Plan section)  Acute Rehab PT Goals Patient Stated Goal: return home PT Goal Formulation:  With patient/family Time For Goal Achievement: 05/26/21 Potential to Achieve Goals: Good    Frequency 7X/week     Co-evaluation               AM-PAC PT "6 Clicks" Mobility  Outcome Measure Help needed turning from your back to your side while in a flat bed without using bedrails?: A Little Help needed moving from lying on your back to sitting on the side of a flat bed without using bedrails?: A Little Help needed moving to and from a bed to a chair (including a wheelchair)?: A Little Help needed standing up from a chair using your arms (e.g., wheelchair or bedside chair)?: A Little Help needed to walk in hospital room?: A Little Help needed climbing 3-5 steps with a railing? : A Lot 6 Click Score: 17    End of Session Equipment Utilized During Treatment: Gait belt Activity Tolerance: Patient tolerated treatment well Patient left: in chair;with call bell/phone within reach;with family/visitor present (on 3c no alarms) Nurse Communication: Mobility status PT Visit Diagnosis: Other abnormalities of gait and mobility (R26.89);Muscle weakness (generalized) (M62.81)    Time: 2536-6440 PT Time Calculation (min) (ACUTE ONLY): 30 min   Charges:   PT Evaluation $PT Eval Low Complexity: 1 Low PT Treatments $Therapeutic Activity: 8-22 mins  Abran Richard, PT Acute Rehab Services Pager [phone] [facility] 05/12/2021, 4:09 PM

## 2021-05-12 NOTE — TOC Progression Note (Addendum)
Transition of Care [facility]) - Progression Note    Patient Details  Name: Ricky Barton MRN: [id number] Date of Birth: 06-23-1955  Transition of Care [facility]) CM/SW Contact  Jacalyn Lefevre Edson Snowball, RN Phone Number: 05/12/2021, 1:41 PM  Clinical Narrative:     [facility] unable to accept patient.   NCM calling other home health agencies      [facility] can [facility] for HHPT.   3C staff will provide any needed DME.    Transition of Care [facility]) Screening Note   Patient Details  Name: Ricky Barton Date of Birth: [birth year]   Transition of Care [facility]) has reviewed patient and no TOC needs have been identified at this time. We will continue to monitor patient advancement through interdisciplinary progression rounds. If new patient transition needs arise, please place a TOC consult.    Expected Discharge Plan and Services                                                 Social Determinants of Health (SDOH) Interventions    Readmission Risk Interventions No flowsheet data found.

## 2021-05-12 NOTE — Transfer of Care (Signed)
Immediate Anesthesia Transfer of Care Note  Patient: Ricky Barton  Procedure(s) Performed: LEFT TOTAL KNEE ARTHROPLASTY (Left: Knee)  Patient Location: PACU  Anesthesia Type:General, Regional and Spinal  Level of Consciousness: drowsy and patient cooperative  Airway & Oxygen Therapy: Patient Spontanous Breathing and Patient connected to face mask oxygen  Post-op Assessment: Report given to RN and Post -op Vital signs reviewed and stable  Post vital signs: Reviewed and stable  Last Vitals:  Vitals Value Taken Time  BP 156/85   Temp    Pulse 98   Resp 14   SpO2 96     Last Pain:  Vitals:   05/12/21 0905  TempSrc:   PainSc: 0-No pain      Patients Stated Pain Goal: 0 (71/69/67 8938)  Complications: No notable events documented.

## 2021-05-12 NOTE — Anesthesia Postprocedure Evaluation (Addendum)
Anesthesia Post Note  Patient: Ricky Barton  Procedure(s) Performed: LEFT TOTAL KNEE ARTHROPLASTY (Left: Knee)     Patient location during evaluation: PACU Anesthesia Type: General Level of consciousness: awake and alert, patient cooperative and oriented Pain management: pain level controlled Vital Signs Assessment: post-procedure vital signs reviewed and stable Respiratory status: spontaneous breathing, nonlabored ventilation and respiratory function stable Cardiovascular status: blood pressure returned to baseline and stable Postop Assessment: patient able to bend at knees, spinal receding, no apparent nausea or vomiting and adequate PO intake Anesthetic complications: no   No notable events documented.  Last Vitals:  Vitals:   05/12/21 1235 05/12/21 1301  BP: (!) 142/94 (!) 143/84  Pulse: 87 88  Resp: 16 18  Temp: 36.7 C 36.7 C  SpO2: 96% 100%    Last Pain:  Vitals:   05/12/21 1301  TempSrc: Oral  PainSc:                  Saige Busby,E. Aalyssa Elderkin

## 2021-05-13 ENCOUNTER — Encounter (HOSPITAL_COMMUNITY): Payer: Self-pay | Admitting: Orthopaedic Surgery

## 2021-05-13 ENCOUNTER — Other Ambulatory Visit: Payer: Self-pay | Admitting: Physician Assistant

## 2021-05-13 DIAGNOSIS — Z96652 Presence of left artificial knee joint: Secondary | ICD-10-CM | POA: Diagnosis not present

## 2021-05-13 DIAGNOSIS — M1712 Unilateral primary osteoarthritis, left knee: Secondary | ICD-10-CM | POA: Diagnosis not present

## 2021-05-13 LAB — BASIC METABOLIC PANEL
Anion gap: 9 (ref 5–15)
BUN: 8 mg/dL (ref 8–23)
CO2: 25 mmol/L (ref 22–32)
Calcium: 8.8 mg/dL — ABNORMAL LOW (ref 8.9–10.3)
Chloride: 104 mmol/L (ref 98–111)
Creatinine, Ser: 0.89 mg/dL (ref 0.61–1.24)
GFR, Estimated: >60 mL/min (ref >60)
Glucose, Bld: 118 mg/dL — ABNORMAL HIGH (ref 70–99)
Potassium: 4.6 mmol/L (ref 3.5–5.1)
Sodium: 138 mmol/L (ref 135–145)

## 2021-05-13 LAB — CBC
HCT: 40.8 % (ref 39.0–52.0)
Hemoglobin: 14.1 g/dL (ref 13.0–17.0)
MCH: 30.9 pg (ref 26.0–34.0)
MCHC: 34.6 g/dL (ref 30.0–36.0)
MCV: 89.5 fL (ref 80.0–100.0)
Platelets: 329 10*3/uL (ref 150–400)
RBC: 4.56 MIL/uL (ref 4.22–5.81)
RDW: 12.4 % (ref 11.5–15.5)
WBC: 19.3 10*3/uL — ABNORMAL HIGH (ref 4.0–10.5)
nRBC: 0 % (ref 0.0–0.2)

## 2021-05-13 NOTE — Progress Notes (Signed)
Patient alert and oriented, voiding adequately, skin clean, dry and intact without evidence of skin break down, or symptoms of complications - no redness or edema noted, only slight tenderness at site.  Patient states pain is manageable at time of discharge. Patient has an appointment with MD.

## 2021-05-13 NOTE — Progress Notes (Signed)
Subjective: 1 Day Post-Op Procedure(s) (LRB): LEFT TOTAL KNEE ARTHROPLASTY (Left) Patient reports pain as mild.    Objective: Vital signs in last 24 hours: Temp:  [97.6 F (36.4 C)-98.6 F (37 C)] 98.5 F (36.9 C) (02/28 0419) Pulse Rate:  [87-110] 92 (02/28 0419) Resp:  [14-20] 20 (02/28 0419) BP: (122-163)/(78-104) 162/95 (02/28 0419) SpO2:  [89 %-100 %] 97 % (02/28 0419) Weight:  [97.5 kg] 97.5 kg (02/27 0813)  Intake/Output from previous day: 02/27 0701 - 02/28 0700 In: 1300 [I.V.:1200; IV Piggyback:100] Out: 3150 [Urine:3050; Blood:100] Intake/Output this shift: Total I/O In: -  Out: 1500 [Urine:1500]  Recent Labs    05/13/21 0500  HGB 14.1   Recent Labs    05/13/21 0500  WBC 19.3*  RBC 4.56  HCT 40.8  PLT 329   Recent Labs    05/13/21 0500  NA 138  K 4.6  CL 104  CO2 25  BUN 8  CREATININE 0.89  GLUCOSE 118*  CALCIUM 8.8*   No results for input(s): LABPT, INR in the last 72 hours.  Neurologically intact Neurovascular intact Sensation intact distally Intact pulses distally Dorsiflexion/Plantar flexion intact Incision: dressing C/D/I No cellulitis present Compartment soft   Assessment/Plan: 1 Day Post-Op Procedure(s) (LRB): LEFT TOTAL KNEE ARTHROPLASTY (Left) Advance diet Up with therapy D/C IV fluids Discharge home with home health after first or second pt session (depending on how well he mobilizes) WBAT LLE   Anticipated LOS equal to or greater than 2 midnights due to - Age 66 and older with one or more of the following:  - Obesity  - Expected need for hospital services (PT, OT, Nursing) required for safe  discharge  - Anticipated need for postoperative skilled nursing care or inpatient rehab  - Active co-morbidities: None OR   - Unanticipated findings during/Post Surgery: None  - Patient is a high risk of re-admission due to: None   Aundra Dubin 05/13/2021, 7:00 AM

## 2021-05-13 NOTE — Discharge Summary (Signed)
Patient ID: Ricky Barton MRN: [id number] DOB/AGE: [birth year] 66 y.o.  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 male who was admitted 05/12/2021 for operative treatment ofPrimary osteoarthritis of left knee. Patient has severe unremitting pain that affects sleep, daily activities, and work/hobbies. After pre-op clearance the patient was taken to the operating room on 05/12/2021 and underwent  Procedure(s): LEFT TOTAL KNEE ARTHROPLASTY.    Patient was given perioperative antibiotics:  Anti-infectives (From admission, onward)    Start     Dose/Rate Route Frequency Ordered Stop   05/12/21 1600  ceFAZolin (ANCEF) IVPB 2g/100 mL premix        2 g 200 mL/hr over 30 Minutes Intravenous Every 6 hours 05/12/21 1246 05/12/21 2231   05/12/21 1031  vancomycin (VANCOCIN) powder  Status:  Discontinued          As needed 05/12/21 1031 05/12/21 1159   05/12/21 0815  ceFAZolin (ANCEF) IVPB 2g/100 mL premix        2 g 200 mL/hr over 30 Minutes Intravenous On call to O.R. 05/12/21 0804 05/13/21 0559        Patient was given sequential compression devices, early ambulation, and chemoprophylaxis to prevent DVT.  Patient benefited maximally from hospital stay and there were no complications.    Recent vital signs: Patient Vitals for the past 24 hrs:  BP Temp Temp src  Pulse Resp SpO2 Height Weight  05/13/21 0419 (!) 162/95 98.5 F (36.9 C) Oral 92 20 97 % -- --  05/12/21 2254 (!) 152/91 98.4 F (36.9 C) Oral (!) 104 18 97 % -- --  05/12/21 2025 131/90 98.2 F (36.8 C) Oral (!) 101 18 98 % -- --  05/12/21 1554 (!) 149/93 98.2 F (36.8 C) Oral 99 17 98 % -- --  05/12/21 1301 (!) 143/84 98.1 F (36.7 C) Oral 88 18 100 % -- --  05/12/21 1235 (!) 142/94 98 F (36.7 C) -- 87 16 96 % -- --  05/12/21 1220 (!) 135/93 -- -- 89 19 96 % -- --  05/12/21 1205 (!) 156/85 97.6 F (36.4 C) -- 99 14 96 % -- --  05/12/21 0905 122/86 -- -- 94 16 96 % -- --  05/12/21 0900 129/78 -- -- 95 14 (!) 89 % -- --  05/12/21 0855 (!) 149/92 -- -- 100 20 94 % -- --  05/12/21 0831 (!) 162/98 -- -- (!) 102 -- -- -- --  05/12/21 0813 (!) 163/104 98.6 F (37 C) Oral (!) 110 18 99 % 5\' 6"  (1.676 m) 97.5 kg     Recent laboratory studies:  Recent Labs    05/13/21 0500  WBC 19.3*  HGB 14.1  HCT 40.8  PLT 329  NA 138  K 4.6  CL 104  CO2 25  BUN 8  CREATININE 0.89  GLUCOSE 118*  CALCIUM 8.8*     Discharge Medications:   Allergies as of 05/13/2021       Reactions   Piroxicam Rash   blisters        Medication List     STOP taking these medications    Fish Oil 1000 MG Caps   ibuprofen 200 MG tablet Commonly known as: ADVIL       TAKE these medications    aspirin EC 81 MG tablet Take 1 tablet (81 mg total) by mouth 2 (two) times daily. To be taken after surgery to prevent blood clots   atorvastatin 10 MG tablet Commonly known as: LIPITOR TAKE 1 TABLET BY MOUTH EVERY DAY   cetirizine 10 MG tablet Commonly known as: ZYRTEC TAKE 1 TABLET BY MOUTH EVERY DAY What changed:  when to take this reasons to take this   cyanocobalamin 1000 MCG tablet Take 1,000 mcg by mouth daily. Vit b12   docusate sodium 100 MG capsule Commonly known as: Colace Take 1 capsule (100 mg total) by mouth daily as needed.   methocarbamol 500 MG tablet Commonly known as:  Robaxin Take 1 tablet (500 mg total) by mouth 2 (two) times daily as needed.   ondansetron 4 MG tablet Commonly known as: Zofran Take 1 tablet (4 mg total) by mouth every 8 (eight) hours as needed for nausea or vomiting.   OSTEO BI-FLEX ONE PER DAY PO Take 2 tablets by mouth daily.   oxyCODONE-acetaminophen 5-325 MG tablet Commonly known as: Percocet Take 1-2 tablets by mouth every 6 (six) hours as needed. To be taken after surgery   pravastatin 20 MG tablet Commonly known as: PRAVACHOL Take 1 tablet (20 mg total) by mouth daily.   sildenafil 100 MG tablet Commonly known as: Viagra TAKE 1/2 TO 1 TABLET BY MOUTH 1 HOUR PRIOR AS NEEDED               Durable Medical Equipment  (From admission, onward)           Start     Ordered   05/12/21 1310  DME Walker rolling  Once       Question Answer Comment  Walker: With [place] Wheels   Patient needs a walker to treat with the following condition Status post left partial knee replacement      05/12/21 1309   05/12/21 1310  DME 3 n 1  Once        05/12/21 1309   05/12/21 1310  DME Bedside commode  Once       Question:  Patient needs a bedside commode to treat with the following condition  Answer:  Status post left partial knee replacement   05/12/21 1309            Diagnostic Studies: DG Knee Left Port  Result Date: 05/12/2021 CLINICAL DATA:  Post LEFT total knee arthroplasty EXAM: PORTABLE LEFT KNEE - 1-2 VIEW COMPARISON:  Portable exam 11 hours compared to 12/26/2020 FINDINGS: Osseous demineralization. New LEFT knee prosthesis without fracture or dislocation. No acute osseous findings. IMPRESSION: LEFT knee prosthesis without acute abnormalities. Electronically Signed   By: Lavonia Dana M.D.   On: 05/12/2021 12:18    Disposition: Discharge disposition: 01-Home or Self Care          Follow-up Information     Leandrew Koyanagi, MD. Schedule an appointment as soon as possible for a visit in 2 week(s).    Specialty: Orthopedic Surgery Contact information: [phone]  [place] [address] [phone]         [facility], [facility] Follow up.   Specialty: Home Health Services Contact information: [place] [address] [phone]                  Signed: Aundra Dubin 05/13/2021, 7:01 AM

## 2021-05-13 NOTE — Progress Notes (Signed)
Physical Therapy Treatment and Discharge Patient Details Name: Ricky Barton MRN: [id number] DOB: 1955-07-15 Today's Date: 05/13/2021   History of Present Illness Pt is 66 yo male s/p L TKA on 05/12/21 .  Pt with hx including arthritis, hypoglycemic syndrome, L ear hearing loss, OSA, isolated seizure.    PT Comments    Pt made excellent progress towards his physical therapy goals during his inpatient stay; overall reporting fair pain control. Pt ambulating 200 feet with a walker and negotiated a step with a walker without physical assist. Performed HEP for L knee strengthening and ROM (written handout provided). Pt achieving 85 degrees seated knee flexion. Will benefit from follow up PT to maximize functional independence.    Recommendations for follow up therapy are one component of a multi-disciplinary discharge planning process, led by the attending physician.  Recommendations may be updated based on patient status, additional functional criteria and insurance authorization.  Follow Up Recommendations  Follow physician's recommendations for discharge plan and follow up therapies     Assistance Recommended at Discharge PRN  Patient can return home with the following A little help with bathing/dressing/bathroom;Help with stairs or ramp for entrance;Assistance with cooking/housework   Equipment Recommendations  None recommended by PT    Recommendations for Other Services       Precautions / Restrictions Precautions Precautions: Fall Restrictions Weight Bearing Restrictions: Yes LLE Weight Bearing: Weight bearing as tolerated     Mobility  Bed Mobility Overal bed mobility: Modified Independent                  Transfers Overall transfer level: Modified independent Equipment used: Rolling walker (2 wheels)                    Ambulation/Gait Ambulation/Gait assistance: Modified independent (Device/Increase time) Gait Distance (Feet): 220 Feet Assistive  device: Rolling walker (2 wheels) Gait Pattern/deviations: Step-through pattern, Decreased stance time - left, Decreased weight shift to left Gait velocity: decreased     General Gait Details: Pt utilizing step through pattern, cues for L heel strike   Stairs Stairs: Yes Stairs assistance: Supervision Stair Management: With walker Number of Stairs: 1 General stair comments: Pt negotiated 1 step with walker at a supervision level with cues for technique/sequencing   Wheelchair Mobility    Modified Rankin (Stroke Patients Only)       Balance Overall balance assessment: Needs assistance Sitting-balance support: No upper extremity supported Sitting balance-Leahy Scale: Good     Standing balance support: Bilateral upper extremity supported, Reliant on assistive device for balance Standing balance-Leahy Scale: Poor Standing balance comment: requiring RW                            Cognition Arousal/Alertness: Awake/alert Behavior During Therapy: WFL for tasks assessed/performed Overall Cognitive Status: Within Functional Limits for tasks assessed                                          Exercises Total Joint Exercises Quad Sets: Left, 15 reps, Supine Heel Slides: Left, 5 reps, Supine Hip ABduction/ADduction: Left, 5 reps, Supine Straight Leg Raises: Left, 5 reps, Supine Long Arc Quad: Left, 10 reps, Seated Knee Flexion: Left, 10 reps, Seated Goniometric ROM: 85 degrees seated knee flexion    General Comments        Pertinent Vitals/Pain  Pain Assessment Pain Assessment: Faces Faces Pain Scale: Hurts little more Pain Location: L knee Pain Descriptors / Indicators: Discomfort, Sore Pain Intervention(s): Limited activity within patient's tolerance, Monitored during session    Home Living                          Prior Function            PT Goals (current goals can now be found in the care plan section) Acute Rehab PT  Goals Patient Stated Goal: return home Potential to Achieve Goals: Good Progress towards PT goals: Goals met/education completed, patient discharged from PT    Frequency    7X/week      PT Plan Other (comment) (d/c therapies)    Co-evaluation              AM-PAC PT "6 Clicks" Mobility   Outcome Measure  Help needed turning from your back to your side while in a flat bed without using bedrails?: None Help needed moving from lying on your back to sitting on the side of a flat bed without using bedrails?: None Help needed moving to and from a bed to a chair (including a wheelchair)?: None Help needed standing up from a chair using your arms (e.g., wheelchair or bedside chair)?: None Help needed to walk in hospital room?: None Help needed climbing 3-5 steps with a railing? : A Little 6 Click Score: 23    End of Session   Activity Tolerance: Patient tolerated treatment well Patient left: with call bell/phone within reach;in chair Nurse Communication: Mobility status PT Visit Diagnosis: Other abnormalities of gait and mobility (R26.89);Muscle weakness (generalized) (M62.81)     Time: 2924-4628 PT Time Calculation (min) (ACUTE ONLY): 24 min  Charges:  $Gait Training: 8-22 mins $Therapeutic Exercise: 8-22 mins                     Ricky Barton, PT, DPT Acute Rehabilitation Services Pager [phone] Office [phone]    Ricky Barton 05/13/2021, 10:12 AM

## 2021-05-13 NOTE — Evaluation (Signed)
Occupational Therapy Evaluation Patient Details Name: Ricky Barton MRN: [id number] DOB: 05/16/1955 Today's Date: 05/13/2021   History of Present Illness Pt is 66 yo male s/p L TKA on 05/12/21 .  Pt with hx including arthritis, hypoglycemic syndrome, L ear hearing loss, OSA, isolated seizure.   Clinical Impression   Patient is s/p L TKA surgery resulting in functional limitations due to the deficits listed below (see OT problem list). Pt has wife (A) 24/7 at home as needed. Patient will benefit from skilled OT acutely to increase independence and safety with ADLS to allow discharge home.       Recommendations for follow up therapy are one component of a multi-disciplinary discharge planning process, led by the attending physician.  Recommendations may be updated based on patient status, additional functional criteria and insurance authorization.   Follow Up Recommendations  No OT follow up    Assistance Recommended at Discharge None  Patient can return home with the following A little help with bathing/dressing/bathroom    Functional Status Assessment  Patient has had a recent decline in their functional status and demonstrates the ability to make significant improvements in function in a reasonable and predictable amount of time.  Equipment Recommendations  None recommended by OT    Recommendations for Other Services       Precautions / Restrictions Precautions Precautions: Fall Restrictions Weight Bearing Restrictions: Yes LLE Weight Bearing: Weight bearing as tolerated      Mobility Bed Mobility               General bed mobility comments: oob in chair    Transfers Overall transfer level: Modified independent                        Balance                                           ADL either performed or assessed with clinical judgement   ADL Overall ADL's : Needs assistance/impaired Eating/Feeding: Independent   Grooming:  Wash/dry hands   Upper Body Bathing: Min guard   Lower Body Bathing: Moderate assistance                         General ADL Comments: pt and wife educated on ted hose with don and doff . pt and wife expressed understandin. educated to use clean linen and wash around the wound     Vision Baseline Vision/History: 1 Wears glasses       Perception     Praxis      Pertinent Vitals/Pain Pain Assessment Pain Assessment: 0-10 Pain Score: 6  Pain Location: L knee Pain Descriptors / Indicators: Discomfort, Sore Pain Intervention(s): Monitored during session, Premedicated before session, Repositioned     Hand Dominance Right   Extremity/Trunk Assessment Upper Extremity Assessment Upper Extremity Assessment: Overall WFL for tasks assessed   Lower Extremity Assessment Lower Extremity Assessment: Defer to PT evaluation   Cervical / Trunk Assessment Cervical / Trunk Assessment: Normal   Communication Communication Communication: No difficulties   Cognition Arousal/Alertness: Awake/alert Behavior During Therapy: WFL for tasks assessed/performed Overall Cognitive Status: Within Functional Limits for tasks assessed  General Comments       Exercises     Shoulder Instructions      Home Living Family/patient expects to be discharged to:: Private residence Living Arrangements: Spouse/significant other Available Help at Discharge: Family;Available 24 hours/day Type of Home: House Home Access: Stairs to enter [employer] of Steps: 2 Entrance Stairs-Rails: None Home Layout: One level     Bathroom Shower/Tub: Occupational psychologist: Standard     Home Equipment: Conservation officer, nature (2 wheels);Shower seat - built in;Hand held shower head;Adaptive equipment Adaptive Equipment: Reacher        Prior Functioning/Environment Prior Level of Function : Independent/Modified Independent;Driving                ADLs Comments: Independent ADLS and IADLs        OT Problem List:        OT Treatment/Interventions:      OT Goals(Current goals can be found in the care plan section) Acute Rehab OT Goals Patient Stated Goal: to return to playing in golf group by May Potential to Achieve Goals: Good  OT Frequency:      Co-evaluation              AM-PAC OT "6 Clicks" Daily Activity     Outcome Measure Help from another person eating meals?: None Help from another person taking care of personal grooming?: None Help from another person toileting, which includes using toliet, bedpan, or urinal?: A Little Help from another person bathing (including washing, rinsing, drying)?: A Little Help from another person to put on and taking off regular upper body clothing?: None Help from another person to put on and taking off regular lower body clothing?: A Little 6 Click Score: 21   End of Session Nurse Communication: Mobility status;Precautions  Activity Tolerance: Patient tolerated treatment well Patient left: in chair;with call bell/phone within reach;with family/visitor present  OT Visit Diagnosis: Unsteadiness on feet (R26.81)                Time: 5003-7048 OT Time Calculation (min): 24 min Charges:  OT General Charges $OT Visit: 1 Visit OT Evaluation $OT Eval Moderate Complexity: 1 Mod   Ricky Barton, OTR/L  Acute Rehabilitation Services Pager: [phone] Office: [phone] .   Jeri Modena 05/13/2021, 10:57 AM

## 2021-05-14 ENCOUNTER — Telehealth: Payer: Self-pay | Admitting: *Deleted

## 2021-05-14 ENCOUNTER — Telehealth: Payer: Self-pay

## 2021-05-14 ENCOUNTER — Other Ambulatory Visit: Payer: Self-pay | Admitting: Physician Assistant

## 2021-05-14 MED ORDER — PROMETHAZINE HCL 12.5 MG PO TABS: 12.5000 mg | ORAL_TABLET | Freq: Four times a day (QID) | ORAL | 0 refills | Status: DC | PRN

## 2021-05-14 NOTE — Telephone Encounter (Signed)
Ortho bundle D/C call completed. 

## 2021-05-14 NOTE — Telephone Encounter (Signed)
49 T has spoken to patients wife.  ? ? ?PA done pending response. ? ? ? ? ? Ricky Barton (Key: [id number]) ?Ondansetron HCl 4MG  tablets ?    ?Outcome: N/A ? ?Created: February 20th, 2023 [phone] ? ?Sent: March 1st, 2023 ? ?Open  Archive ?

## 2021-05-14 NOTE — Telephone Encounter (Signed)
PA says N/A will have to wait for a decision via fax. ? ?Mendel Ryder, Did you want to send in something else in the mean time? ? ?

## 2021-05-14 NOTE — Telephone Encounter (Signed)
Patient's wife called stating that patient had passed out this morning while doing his exercises and she had to call the ambulance to her home.  Stated that she was told that something came up on the EKG and was advised to call us.  I did advise patient that she should also contact patient's primary care doctor.  Cb# [phone].  Please advise.  Thank you. ?

## 2021-05-14 NOTE — Telephone Encounter (Signed)
Sent in phenergan.  Will you let him know that it may cause drowsiness

## 2021-05-14 NOTE — Telephone Encounter (Signed)
Thank you for letting us know.  Did they take him to the hospital?  I would definitely advise them calling primary care doctor

## 2021-05-14 NOTE — Care Plan (Signed)
(  Late entry for 2//28/23)- RNCM went to hospital and met with patient and his wife to discuss the Ortho bundle program and that patient will be followed by [facility]. Was not given his surgery information prior to surgery on Monday, 05/12/21, so was unable to reach out pre-operatively and discuss his Left total knee arthroplasty per Dr. Erlinda Hong. Reviewed all information including post op instructions. HHPT referral made by hospital to [facility], who accepted referral. Patient was given RW, 3in1/BSC, and CPM for home use by [employer] prior to surgery. Would like to attend OPPT at [facility]. This appointment has been scheduled for same day as post op appointment with Dr. Erlinda Hong.  ? ?Update from today- RNCM call received from patient's wife stating he passed out today while sitting in his chair and doing his home exercises. She called EMS, who arrived very quickly since they are close to their home and patient was already waking up when they arrived. HR was in low 100s, BP 140/84. She states they did a bedside EKG and determined it was abnormal with Sinus tachycardia and other abnormalities per the wife. He denies any SOB, chest pain or other issues other than pain related to his knee replacement. He did not go to the ED. He took muscle relaxer last night, pain medication this morning and is having some nausea with this. Unable to pick up his Zofran due to insurance prior auth needed. Called in Phenergan until this can be resolved by PA. Discussed the abnormal EKG results and that this should be followed up with his PCP. Wife attempted to call and reach them but was on hold for a long time. Will call back and update. RNCM checked with [facility] and they will see him later in the week. Updated on events of today as well including when OPPT will be starting. Updated wife of new Rx for Phenergan. Patient doing better after lunch today.  ?

## 2021-05-15 ENCOUNTER — Telehealth: Payer: Self-pay | Admitting: *Deleted

## 2021-05-15 NOTE — Telephone Encounter (Signed)
Ortho bundle call completed to check status. No further issues with passing out or feeling like this. Doing well he states. ?

## 2021-05-17 DIAGNOSIS — H9192 Unspecified hearing loss, left ear: Secondary | ICD-10-CM | POA: Diagnosis not present

## 2021-05-17 DIAGNOSIS — G4733 Obstructive sleep apnea (adult) (pediatric): Secondary | ICD-10-CM | POA: Diagnosis not present

## 2021-05-17 DIAGNOSIS — Z471 Aftercare following joint replacement surgery: Secondary | ICD-10-CM | POA: Diagnosis not present

## 2021-05-17 DIAGNOSIS — G40909 Epilepsy, unspecified, not intractable, without status epilepticus: Secondary | ICD-10-CM | POA: Diagnosis not present

## 2021-05-17 DIAGNOSIS — Z9181 History of falling: Secondary | ICD-10-CM | POA: Diagnosis not present

## 2021-05-17 DIAGNOSIS — Z7982 Long term (current) use of aspirin: Secondary | ICD-10-CM | POA: Diagnosis not present

## 2021-05-17 DIAGNOSIS — Z96652 Presence of left artificial knee joint: Secondary | ICD-10-CM | POA: Diagnosis not present

## 2021-05-19 ENCOUNTER — Telehealth: Payer: Self-pay | Admitting: Orthopaedic Surgery

## 2021-05-19 ENCOUNTER — Telehealth: Payer: Self-pay | Admitting: *Deleted

## 2021-05-19 NOTE — Telephone Encounter (Signed)
Called amy to approve orders ? ?

## 2021-05-19 NOTE — Telephone Encounter (Signed)
Ortho bundle 7 day call completed. 

## 2021-05-19 NOTE — Telephone Encounter (Signed)
Amy (PT) from [facility] called requesting verbal orders for plan of care for home health pt of 3 wk 2. Amy also needs to give medication changes. Please call Amy at [phone]. If unable to answer leave vm for call back on secure line. ?

## 2021-05-27 ENCOUNTER — Other Ambulatory Visit: Payer: Self-pay

## 2021-05-27 ENCOUNTER — Encounter: Payer: Self-pay | Admitting: Physical Therapy

## 2021-05-27 ENCOUNTER — Ambulatory Visit (INDEPENDENT_AMBULATORY_CARE_PROVIDER_SITE_OTHER): Payer: PPO | Admitting: Physician Assistant

## 2021-05-27 ENCOUNTER — Telehealth: Payer: Self-pay | Admitting: *Deleted

## 2021-05-27 ENCOUNTER — Ambulatory Visit: Payer: PPO | Admitting: Physical Therapy

## 2021-05-27 ENCOUNTER — Encounter: Payer: Self-pay | Admitting: Orthopaedic Surgery

## 2021-05-27 DIAGNOSIS — R2681 Unsteadiness on feet: Secondary | ICD-10-CM

## 2021-05-27 DIAGNOSIS — R2689 Other abnormalities of gait and mobility: Secondary | ICD-10-CM | POA: Diagnosis not present

## 2021-05-27 DIAGNOSIS — Z96652 Presence of left artificial knee joint: Secondary | ICD-10-CM

## 2021-05-27 DIAGNOSIS — R600 Localized edema: Secondary | ICD-10-CM

## 2021-05-27 DIAGNOSIS — M6281 Muscle weakness (generalized): Secondary | ICD-10-CM

## 2021-05-27 DIAGNOSIS — M25562 Pain in left knee: Secondary | ICD-10-CM | POA: Diagnosis not present

## 2021-05-27 DIAGNOSIS — M25662 Stiffness of left knee, not elsewhere classified: Secondary | ICD-10-CM | POA: Diagnosis not present

## 2021-05-27 MED ORDER — OXYCODONE-ACETAMINOPHEN 5-325 MG PO TABS: 1.0000 | ORAL_TABLET | Freq: Four times a day (QID) | ORAL | 0 refills | Status: DC | PRN

## 2021-05-27 MED ORDER — METHOCARBAMOL 500 MG PO TABS: 500.0000 mg | ORAL_TABLET | Freq: Two times a day (BID) | ORAL | 2 refills | Status: DC | PRN

## 2021-05-27 NOTE — Therapy (Signed)
?OUTPATIENT PHYSICAL THERAPY LOWER EXTREMITY EVALUATION ? ? ?Patient Name: Ricky Barton ?MRN: [id number] ?DOB:November 28, 1955, 66 y.o., male ?Today's Date: 05/27/2021 ? ? PT End of Session - 05/27/21 1632   ? ? Visit Number 1   ? Number of Visits 18   ? Date for PT Re-Evaluation 07/18/21   ? Authorization Type [employer]   ? Authorization Time Period $30 co-pay   ? Progress Note Due on Visit 10   ? PT Start Time 1430   ? PT Stop Time 6195   ? PT Time Calculation (min) 44 min   ? ?  ?  ? ?  ? ? ?Past Medical History:  ?Diagnosis Date  ? Arthritis   ? Hypoglycemic syndrome   ? 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 OSA on CPAP 09/12/2015  ? Severe by HST  ? Seizures ([place]) 1990s  ? isolated, none since, was on dilantin for 3 yrs, then stopped  ? ?Past Surgical History:  ?Procedure Laterality Date  ? CHOLECYSTECTOMY  1989  ? COLONOSCOPY  03/2011  ? 3 adenomatous polyps, rec rpt 5 yrs (Dr Fuller Plan).  ? COLONOSCOPY  07/2015  ? WNL, rpt 5 yrs Fuller Plan)  ? Head MRI  06/02  ? POLYPECTOMY    ? TOTAL KNEE ARTHROPLASTY Left 05/12/2021  ? Procedure: LEFT TOTAL KNEE ARTHROPLASTY;  Surgeon: Leandrew Koyanagi, MD;  Location: [place];  Service: Orthopedics;  Laterality: Left;  ? ?Patient Active Problem List  ? Diagnosis Date Noted  ? Primary osteoarthritis of left knee 05/12/2021  ? Status post total left knee replacement 05/12/2021  ? Allergic rhinitis 12/09/2020  ? Low serum vitamin B12 12/07/2020  ? Welcome to Medicare preventive visit 12/06/2020  ? Advanced directives, counseling/discussion 12/06/2020  ? Angular cheilitis 12/06/2020  ? 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 Elevated blood pressure reading without diagnosis of hypertension 11/10/2018  ? Renal lesion 03/28/2018  ? Arthralgia of hands, bilateral 09/12/2015  ? OSA on CPAP 09/12/2015  ? Obesity, Class I, BMI 30-34.9 04/05/2014  ? Hyperglycemia 03/30/2013  ? Healthcare maintenance 03/04/2012  ? HLD (hyperlipidemia) 01/18/2009  ? 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 History of seizure 08/15/1991  ? ? ?PCP: Ria Bush, MD ? ?REFERRING PROVIDER: Aundra Dubin, PA-C ? ?REFERRING DIAG: K93.267 (ICD-10-CM) - Hx of total knee replacement, left  ? ?THERAPY DIAG:  ?Acute pain of left knee ? ?Stiffness of left knee, not elsewhere classified ? ?Muscle weakness (generalized) ? ?Localized edema ? ?Unsteadiness on feet ? ?Other abnormalities of gait and mobility ? ?ONSET DATE: 05/12/2021 ? ?SUBJECTIVE:  ? ?SUBJECTIVE STATEMENT: ?This 66yo male underwent a left Total Knee Replacement on 05/12/2021 due to arthritis.  HHPT finished on 05/26/2021.  He saw MD / PA just before PT with good response thus far and removed staples.  He reports that skin is very sensitive.  ? ?PERTINENT HISTORY: ?arthritis, hypoglemic syndrome, left ear hearing loss, seizures,  ? ?PAIN:  ?Are you having pain? Yes: NPRS scale: 6/10  in last week lowest 0/10 and highest 10/10 ?Pain location: knee left medial & lateral down to ankle ?Pain description: throbbing & sharp ?Aggravating factors: laying down & when first moves ?Relieving factors: meds, ice,  ? ?PRECAUTIONS: None ? ?WEIGHT BEARING RESTRICTIONS No ? ?FALLS:  ?Has patient fallen in last 6 months? No, Number of falls: 0 ? ?LIVING ENVIRONMENT: ?Lives with: lives with their family, lives with their spouse, and 2 dogs 14# &  50# leash walked,  ?Lives in: House/apartment ?Stairs: Yes; External: 6 steps; on left going up ?Has following equipment at home: Single point cane, Walker - 2 wheeled, and shower chair ? ?OCCUPATION: retired ? ?PLOF: Independent, Independent with household mobility without device, and Independent with community mobility without device ? ?PATIENT GOALS   play golf, yard work, Animator work, walk & care for dogs.  ? ? ?OBJECTIVE:  ? ?PATIENT SURVEYS:  ?FOTO 49% target 66% ? ?COGNITION:  Overall cognitive status: Within functional limits for tasks assessed   ? ?POSTURE:  stands weight shift to right, flexed  posture, head forward.  ? ?EDEMA: ?Right knee above knee 44.8cm  around knee  42.8cm below knee  38.5cm ?Left knee   above knee 51.0 cm around knee 48.6cm  below knee  44.5 cm ? ?PALPATION:  tenderness medial & lateral knee,  hypersensitive of skin to touch around knee mainly where he was shaved for surgery.  ? ?LE ROM: ? ?Passive(P) & Active(A) ROM Right ?05/27/2021 Left ?05/27/2021  ?Hip flexion    ?Hip extension    ?Hip abduction    ?Hip adduction    ?Hip internal rotation    ?Hip external rotation    ?Knee flexion  P: supine ?84* ?A: seated 79*  ?Knee extension  P: supine ?-7* ?A: seated LAQ -24*  ?Ankle dorsiflexion    ?Ankle plantarflexion    ?Ankle inversion    ?Ankle eversion    ?(Blank rows = not tested) ? ?LE MMT: ? ?MMT Right ?05/27/2021 Left ?05/27/2021  ?Hip flexion    ?Hip extension    ?Hip abduction    ?Hip adduction    ?Hip internal rotation    ?Hip external rotation    ?Knee flexion  3-/5  ?Knee extension  3-/5  ?Ankle dorsiflexion    ?Ankle plantarflexion    ?Ankle inversion    ?Ankle eversion    ? (Blank rows = not tested) ? ?GAIT: ?Distance walked: 120' ?Assistive device utilized: Single point cane ?Level of assistance: SBA ?Comments: antalgic, decreased stance LLE, left knee flexed in stance and minimal flexion for swing,  hip hiking & circumduction to clear LLE. Step-to pattern.  ? ? ?TODAY'S TREATMENT: ?PT instructed in HEP.  ? ?PATIENT EDUCATION:  ?Education details: Access Code: [email or web address] ?Person educated: Patient and Spouse ?Education method: Explanation, Demonstration, Tactile cues, Verbal cues, and Handouts ?Education comprehension: verbalized understanding, returned demonstration, verbal cues required, tactile cues required, and needs further education ? ?HOME EXERCISE PROGRAM: ?Access Code: [email or web address] ?URL: [email or web address] ?Date: 05/27/2021 ?Prepared by: Jamey Reas ? ?Exercises ?Quad Setting and Stretching - 2-4 x daily - 7 x weekly - 5-10 sets - 10 reps - prop 5-10  minutes & quad set5 seconds hold ?Ankle Alphabet in Elevation - 2-4 x daily - 7 x weekly - 1 sets - 1 reps ?Supine Heel Slide with Strap - 2-3 x daily - 7 x weekly - 2-3 sets - 10 reps - 5 seconds hold ?Supine Straight Leg Raises - 2-3 x daily - 7 x weekly - 2-3 sets - 10 reps - 5 seconds hold ?Seated Knee Flexion Extension AROM - 2-4 x daily - 7 x weekly - 2-3 sets - 10 reps - 5 seconds hold ?Seated Hamstring Stretch with Strap - 2-4 x daily - 7 x weekly - 1 sets - 3 reps - 20-30 seconds hold ?Standing Gastroc Stretch - 2-4 x daily - 7 x weekly - 1 sets - 3 reps - 20-30 seconds hold ? ? ?  ASSESSMENT: ? ?CLINICAL IMPRESSION: ?Patient is a 66 y.o. male who was seen today for physical therapy evaluation and treatment for left Total Knee Replacement.  He demonstrates decreased strength, ROM, increased edema and pain with gait abnormalities affecting functional mobility. ? ?OBJECTIVE IMPAIRMENTS Abnormal gait, decreased activity tolerance, decreased balance, decreased endurance, decreased knowledge of use of DME, decreased mobility, decreased ROM, decreased strength, increased edema, impaired flexibility, postural dysfunction, and pain.  ? ?ACTIVITY LIMITATIONS community activity, driving, and yard work.  ? ?PERSONAL FACTORS 1 comorbidity: arthritis, hypoglemic syndrome, left ear hearing loss, seizure last one 65yr ago  are also affecting patient's functional outcome.  ? ?REHAB POTENTIAL: Good ? ?CLINICAL DECISION MAKING: Stable/uncomplicated ? ?EVALUATION COMPLEXITY: Low ? ? ?GOALS: ?Goals reviewed with patient? Yes ? ?SHORT TERM GOALS: Target date: 06/20/2021 ? ?Patient independent and verbalizes compliance with initial HEP ?Baseline: SEE OBJECTIVE DATA ?Goal status: INITIAL ? ?2.  Patient reports 50% improvement in left knee pain. ?Baseline: SEE OBJECTIVE DATA ?Goal status: INITIAL ? ?3.  PROM left knee extension -3* to flexion 90* ?Baseline: SEE OBJECTIVE DATA ?Goal status: INITIAL ? ?LONG TERM GOALS: Target date:  07/18/2021 ? ?Patient will improve FOTO score to 66% ?Baseline: SEE OBJECTIVE DATA ?Goal status: INITIAL ? ?2.  Patient reports left knee pain </= 2/10 with standing & gait activities.  ?Baseline: SEE OBJECTIVE DAT

## 2021-05-27 NOTE — Progress Notes (Signed)
? ?Post-Op Visit Note ?  ?Patient: Ricky Barton           ?Date of Birth: Feb 11, 1956           ?MRN: [id number] ?Visit Date: 05/27/2021 ?PCP: Ria Bush, MD ? ? ?Assessment & Plan: ? ?Chief Complaint:  ?Chief Complaint  ?Patient presents with  ? Left Knee - Routine Post Op, Pain  ? ?Visit Diagnoses:  ?1. S/P total knee replacement, left   ? ? ?Plan: Patient is a pleasant 66 year old gentleman who comes in today 2 weeks status post left total knee replacement 05/12/2021.  He has been doing well.  He has been getting home health physical therapy.  He is ambulating with a single-point cane.  He has been taking oxycodone and Robaxin.  He has been compliant taking a baby aspirin twice daily.  Examination of the left knee reveals a well-healing surgical incision with nylon sutures in place.  No evidence of infection or cellulitis.  Calf is soft and minimally tender.  Negative Homans.  He is neurovascularly intact distally.  Today, sutures removed and Steri-Strips applied.  He is scheduled to start outpatient physical therapy this afternoon.  He will continue his baby aspirin twice daily for another 4 weeks.  Dental prophylaxis reinforced.  Follow-up with [place] in 4 weeks time for repeat evaluation and 2 view x-rays of the left knee.  Call with concerns or questions. ? ?Follow-Up Instructions: Return in about 4 weeks (around 06/24/2021).  ? ?Orders:  ?No orders of the defined types were placed in this encounter. ? ?Meds ordered this encounter  ?Medications  ? methocarbamol (ROBAXIN) 500 MG tablet  ?  Sig: Take 1 tablet (500 mg total) by mouth 2 (two) times daily as needed.  ?  Dispense:  20 tablet  ?  Refill:  2  ? oxyCODONE-acetaminophen (PERCOCET) 5-325 MG tablet  ?  Sig: Take 1-2 tablets by mouth every 6 (six) hours as needed.  ?  Dispense:  40 tablet  ?  Refill:  0  ? ? ?Imaging: ?No new imaging ? ?PMFS History: ?Patient Active Problem List  ? Diagnosis Date Noted  ? Primary osteoarthritis of left knee 05/12/2021   ? Status post total left knee replacement 05/12/2021  ? Allergic rhinitis 12/09/2020  ? Low serum vitamin B12 12/07/2020  ? Welcome to Medicare preventive visit 12/06/2020  ? Advanced directives, counseling/discussion 12/06/2020  ? Angular cheilitis 12/06/2020  ? 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 Elevated blood pressure reading without diagnosis of hypertension 11/10/2018  ? Renal lesion 03/28/2018  ? Arthralgia of hands, bilateral 09/12/2015  ? OSA on CPAP 09/12/2015  ? Obesity, Class I, BMI 30-34.9 04/05/2014  ? Hyperglycemia 03/30/2013  ? Healthcare maintenance 03/04/2012  ? HLD (hyperlipidemia) 01/18/2009  ? 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 History of seizure 08/15/1991  ? ?Past Medical History:  ?Diagnosis Date  ? Arthritis   ? Hypoglycemic syndrome   ? 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 OSA on CPAP 09/12/2015  ? Severe by HST  ? Seizures ([place]) 1990s  ? isolated, none since, was on dilantin for 3 yrs, then stopped  ?  ?Family History  ?Problem Relation Age of Onset  ? Hypertension Mother   ? Colon polyps Mother   ? Cancer Father 41  ?     stomach cancer  ? Hyperlipidemia Brother   ? CAD Brother 7  ?     MI (smoker)  ?  Hyperlipidemia Brother   ? Sleep apnea Brother   ? Pancreatic cancer Brother 83  ? Hyperlipidemia Brother   ? CAD Brother 87  ?     MI (smoker)  ? Hyperlipidemia Brother   ? Multiple sclerosis Brother   ? Colon cancer Neg Hx   ? Esophageal cancer Neg Hx   ? Rectal cancer Neg Hx   ?  ?Past Surgical History:  ?Procedure Laterality Date  ? CHOLECYSTECTOMY  1989  ? COLONOSCOPY  03/2011  ? 3 adenomatous polyps, rec rpt 5 yrs (Dr Fuller Plan).  ? COLONOSCOPY  07/2015  ? WNL, rpt 5 yrs Fuller Plan)  ? Head MRI  06/02  ? POLYPECTOMY    ? TOTAL KNEE ARTHROPLASTY Left 05/12/2021  ? Procedure: LEFT TOTAL KNEE ARTHROPLASTY;  Surgeon: Leandrew Koyanagi, MD;  Location: [place];  Service: Orthopedics;  Laterality: Left;  ? ?Social History  ? ?Occupational History  ? Occupation:  Art therapist  ?  Employer: [employer]  ?  Comment: [employer]  ?Tobacco Use  ? Smoking status: Never  ? Smokeless tobacco: Never  ?Vaping Use  ? Vaping Use: Never used  ?Substance and Sexual Activity  ? Alcohol use: No  ? Drug use: No  ? Sexual activity: Yes  ? ? ? ?

## 2021-05-27 NOTE — Telephone Encounter (Signed)
Ortho bundle 14 day in office meeting completed today during patient's post op appointment. ?

## 2021-05-28 ENCOUNTER — Ambulatory Visit (INDEPENDENT_AMBULATORY_CARE_PROVIDER_SITE_OTHER): Payer: PPO | Admitting: Physical Therapy

## 2021-05-28 ENCOUNTER — Encounter: Payer: Self-pay | Admitting: Physical Therapy

## 2021-05-28 DIAGNOSIS — M6281 Muscle weakness (generalized): Secondary | ICD-10-CM | POA: Diagnosis not present

## 2021-05-28 DIAGNOSIS — M25662 Stiffness of left knee, not elsewhere classified: Secondary | ICD-10-CM | POA: Diagnosis not present

## 2021-05-28 DIAGNOSIS — R600 Localized edema: Secondary | ICD-10-CM | POA: Diagnosis not present

## 2021-05-28 DIAGNOSIS — M25562 Pain in left knee: Secondary | ICD-10-CM

## 2021-05-28 DIAGNOSIS — R2689 Other abnormalities of gait and mobility: Secondary | ICD-10-CM

## 2021-05-28 DIAGNOSIS — R2681 Unsteadiness on feet: Secondary | ICD-10-CM | POA: Diagnosis not present

## 2021-05-28 NOTE — Therapy (Signed)
?OUTPATIENT PHYSICAL THERAPY Treatment ? ? ?Patient Name: Ricky Barton ?MRN: [id number] ?DOB:1955/10/20, 66 y.o., male ?Today's Date: 05/28/2021 ? ? PT End of Session - 05/28/21 0712   ? ? Visit Number 2   ? Number of Visits 18   ? Date for PT Re-Evaluation 07/18/21   ? Authorization Type [employer]   ? Authorization Time Period $30 co-pay   ? Progress Note Due on Visit 10   ? PT Start Time 0800   ? PT Stop Time 0850   ? PT Time Calculation (min) 50 min   ? ?  ?  ? ?  ? ? ?Past Medical History:  ?Diagnosis Date  ? Arthritis   ? Hypoglycemic syndrome   ? 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 OSA on CPAP 09/12/2015  ? Severe by HST  ? Seizures ([place]) 1990s  ? isolated, none since, was on dilantin for 3 yrs, then stopped  ? ?Past Surgical History:  ?Procedure Laterality Date  ? CHOLECYSTECTOMY  1989  ? COLONOSCOPY  03/2011  ? 3 adenomatous polyps, rec rpt 5 yrs (Dr Fuller Plan).  ? COLONOSCOPY  07/2015  ? WNL, rpt 5 yrs Fuller Plan)  ? Head MRI  06/02  ? POLYPECTOMY    ? TOTAL KNEE ARTHROPLASTY Left 05/12/2021  ? Procedure: LEFT TOTAL KNEE ARTHROPLASTY;  Surgeon: Leandrew Koyanagi, MD;  Location: [facility];  Service: Orthopedics;  Laterality: Left;  ? ?Patient Active Problem List  ? Diagnosis Date Noted  ? Primary osteoarthritis of left knee 05/12/2021  ? Status post total left knee replacement 05/12/2021  ? Allergic rhinitis 12/09/2020  ? Low serum vitamin B12 12/07/2020  ? Welcome to Medicare preventive visit 12/06/2020  ? Advanced directives, counseling/discussion 12/06/2020  ? Angular cheilitis 12/06/2020  ? 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 Elevated blood pressure reading without diagnosis of hypertension 11/10/2018  ? Renal lesion 03/28/2018  ? Arthralgia of hands, bilateral 09/12/2015  ? OSA on CPAP 09/12/2015  ? Obesity, Class I, BMI 30-34.9 04/05/2014  ? Hyperglycemia 03/30/2013  ? Healthcare maintenance 03/04/2012  ? HLD (hyperlipidemia) 01/18/2009  ? 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 History of seizure 08/15/1991  ? ? ?PCP: Ria Bush, MD ? ?REFERRING PROVIDER: Aundra Dubin, PA-C ? ?REFERRING DIAG: R97.588 (ICD-10-CM) - Hx of total knee replacement, left  ? ?THERAPY DIAG:  ?Acute pain of left knee ? ?Stiffness of left knee, not elsewhere classified ? ?Muscle weakness (generalized) ? ?Localized edema ? ?Unsteadiness on feet ? ?Other abnormalities of gait and mobility ? ?ONSET DATE: 05/12/2021 ? ?SUBJECTIVE:  ? ?SUBJECTIVE STATEMENT: ?He says he has stiffness and soreness after prolonged sitting ? ?PERTINENT HISTORY: ?arthritis, hypoglemic syndrome, left ear hearing loss, seizures,  ? ?PAIN:  ?Are you having pain? Yes: NPRS scale: 3/10   ?Pain location: knee left medial & lateral down to ankle ?Pain description: throbbing & sharp ?Aggravating factors: laying down & when first moves ?Relieving factors: meds, ice,  ?PRECAUTIONS: None ? ?WEIGHT BEARING RESTRICTIONS No ? ?PATIENT GOALS   play golf, yard work, Animator work, walk & care for dogs.  ? ? ?OBJECTIVE:  ? ?PATIENT SURVEYS:  ?FOTO 49% target 66% ? ?EDEMA: ?Right knee above knee 44.8cm  around knee  42.8cm below knee  38.5cm ?Left knee   above knee 51.0 cm around knee 48.6cm  below knee  44.5 cm ? ? ?LE ROM: ? ?Passive(P) & Active(A) ROM Right ?05/27/2021 Left ?05/27/2021  ?Hip  flexion    ?Hip extension    ?Hip abduction    ?Hip adduction    ?Hip internal rotation    ?Hip external rotation    ?Knee flexion  P: supine ?84* ?A: seated 79*  ?Knee extension  P: supine ?-7* ?A: seated LAQ -24*  ?Ankle dorsiflexion    ?Ankle plantarflexion    ?Ankle inversion    ?Ankle eversion    ?(Blank rows = not tested) ? ?LE MMT: ? ?MMT Right ?05/27/2021 Left ?05/27/2021  ?Hip flexion    ?Hip extension    ?Hip abduction    ?Hip adduction    ?Hip internal rotation    ?Hip external rotation    ?Knee flexion  3-/5  ?Knee extension  3-/5  ?Ankle dorsiflexion    ?Ankle plantarflexion    ?Ankle inversion    ?Ankle eversion    ? (Blank rows = not  tested) ? ?GAIT:05/27/21 ?Distance walked: 120' ?Assistive device utilized: Single point cane ?Level of assistance: SBA ?Comments: antalgic, decreased stance LLE, left knee flexed in stance and minimal flexion for swing,  hip hiking & circumduction to clear LLE. Step-to pattern.  ? ? ?TODAY'S TREATMENT: ?05/28/21 ?Therapeutic Exercise: ? Aerobic:Nu step UE/LE gentle ROM 8 min L5 ?Supine:quad set TKE 5 sec X10, heelslides 10 X 5 sec ?Prone: ? Seated:LAQ 1.5# 2X10 on Left, Hamstring curl red 2X10 on left ? Standing:Slantboard 30 sec X 3, heel toe raises  X15 ?Neuromuscular Re-education: ?Manual Therapy:supine Lt knee PROM and manual hamstring stretching to tolerance ?Therapeutic Activity: ?Self Care: ?Trigger Point Dry Needling:  ?Modalities: Vasopnuematic to Lt knee 10 min 34 deg medium compression ? ? ?PATIENT EDUCATION:  ?Education details: Access Code: [email or web address] ?Person educated: Patient and Spouse ?Education method: Explanation, Demonstration, Tactile cues, Verbal cues, and Handouts ?Education comprehension: verbalized understanding, returned demonstration, verbal cues required, tactile cues required, and needs further education ? ?HOME EXERCISE PROGRAM: ?Access Code: [email or web address] ?URL: [email or web address] ?Date: 05/27/2021 ?Prepared by: Jamey Reas ? ?Exercises ?Quad Setting and Stretching - 2-4 x daily - 7 x weekly - 5-10 sets - 10 reps - prop 5-10 minutes & quad set5 seconds hold ?Ankle Alphabet in Elevation - 2-4 x daily - 7 x weekly - 1 sets - 1 reps ?Supine Heel Slide with Strap - 2-3 x daily - 7 x weekly - 2-3 sets - 10 reps - 5 seconds hold ?Supine Straight Leg Raises - 2-3 x daily - 7 x weekly - 2-3 sets - 10 reps - 5 seconds hold ?Seated Knee Flexion Extension AROM - 2-4 x daily - 7 x weekly - 2-3 sets - 10 reps - 5 seconds hold ?Seated Hamstring Stretch with Strap - 2-4 x daily - 7 x weekly - 1 sets - 3 reps - 20-30 seconds hold ?Standing Gastroc Stretch - 2-4 x daily - 7 x weekly - 1 sets -  3 reps - 20-30 seconds hold ? ? ?ASSESSMENT: ? ?CLINICAL IMPRESSION: ?Session focused on improving Lt knee ROM and quad activation. He had fair to good overall tolerance to session and used Vasopnuematic at end to control pain and edema.  ? ?OBJECTIVE IMPAIRMENTS Abnormal gait, decreased activity tolerance, decreased balance, decreased endurance, decreased knowledge of use of DME, decreased mobility, decreased ROM, decreased strength, increased edema, impaired flexibility, postural dysfunction, and pain.  ? ?ACTIVITY LIMITATIONS community activity, driving, and yard work.  ? ?PERSONAL FACTORS 1 comorbidity: arthritis, hypoglemic syndrome, left ear hearing loss, seizure last one 29yr ago  are also affecting patient's functional outcome.  ? ?  REHAB POTENTIAL: Good ? ?CLINICAL DECISION MAKING: Stable/uncomplicated ? ?EVALUATION COMPLEXITY: Low ? ? ?GOALS: ?Goals reviewed with patient? Yes ? ?SHORT TERM GOALS: Target date: 06/20/2021 ? ?Patient independent and verbalizes compliance with initial HEP ?Baseline: SEE OBJECTIVE DATA ?Goal status: INITIAL ? ?2.  Patient reports 50% improvement in left knee pain. ?Baseline: SEE OBJECTIVE DATA ?Goal status: INITIAL ? ?3.  PROM left knee extension -3* to flexion 90* ?Baseline: SEE OBJECTIVE DATA ?Goal status: INITIAL ? ?LONG TERM GOALS: Target date: 07/18/2021 ? ?Patient will improve FOTO score to 66% ?Baseline: SEE OBJECTIVE DATA ?Goal status: INITIAL ? ?2.  Patient reports left knee pain </= 2/10 with standing & gait activities.  ?Baseline: SEE OBJECTIVE DATA ?Goal status: INITIAL ? ?3.  Left Knee PROM 0* extension to 100* flexion ?Baseline: SEE OBJECTIVE DATA ?Goal status: INITIAL ? ?4.  Left Knee AROM anti-gravity -3* extension to 90* flexion ?Baseline: SEE OBJECTIVE DATA ?Goal status: INITIAL ? ?5.  Patient ambulates >500' community distances including negotiating ramps, curbs & stairs without device independently. ?Baseline: SEE OBJECTIVE DATA ?Goal status: INITIAL ? ?6.   Patient reports & demo tasks to enable return to yard work, walking his dogs & light handyman activities to meet his goals.  ?Baseline: SEE OBJECTIVE DATA ?Goal status: INITIAL ? ?PLAN: ?PT FREQUENCY: 3x/w

## 2021-05-29 ENCOUNTER — Ambulatory Visit: Payer: PPO | Admitting: Physical Therapy

## 2021-05-29 ENCOUNTER — Other Ambulatory Visit: Payer: Self-pay

## 2021-05-29 ENCOUNTER — Encounter: Payer: Self-pay | Admitting: Physical Therapy

## 2021-05-29 DIAGNOSIS — R600 Localized edema: Secondary | ICD-10-CM

## 2021-05-29 DIAGNOSIS — M6281 Muscle weakness (generalized): Secondary | ICD-10-CM

## 2021-05-29 DIAGNOSIS — R2681 Unsteadiness on feet: Secondary | ICD-10-CM | POA: Diagnosis not present

## 2021-05-29 DIAGNOSIS — M25662 Stiffness of left knee, not elsewhere classified: Secondary | ICD-10-CM

## 2021-05-29 DIAGNOSIS — M25562 Pain in left knee: Secondary | ICD-10-CM | POA: Diagnosis not present

## 2021-05-29 NOTE — Therapy (Signed)
?OUTPATIENT PHYSICAL THERAPY Treatment ? ? ?Patient Name: Ricky Barton ?MRN: [id number] ?DOB:[birth year], 66 y.o., male ?Today's Date: 05/29/2021 ? ? PT End of Session - 05/29/21 0936   ? ? Visit Number 3   ? Number of Visits 18   ? Date for PT Re-Evaluation 07/18/21   ? Authorization Type [employer]   ? Authorization Time Period $30 co-pay   ? Progress Note Due on Visit 10   ? PT Start Time [phone]   ? PT Stop Time 1020   ? PT Time Calculation (min) 49 min   ? Activity Tolerance Patient tolerated treatment well   ? Behavior During Therapy [facility] for tasks assessed/performed   ? ?  ?  ? ?  ? ? ?Past Medical History:  ?Diagnosis Date  ? Arthritis   ? Hypoglycemic syndrome   ? 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 OSA on CPAP 09/12/2015  ? Severe by HST  ? Seizures ([place]) 1990s  ? isolated, none since, was on dilantin for 3 yrs, then stopped  ? ?Past Surgical History:  ?Procedure Laterality Date  ? CHOLECYSTECTOMY  1989  ? COLONOSCOPY  03/2011  ? 3 adenomatous polyps, rec rpt 5 yrs (Dr Fuller Plan).  ? COLONOSCOPY  07/2015  ? WNL, rpt 5 yrs Fuller Plan)  ? Head MRI  06/02  ? POLYPECTOMY    ? TOTAL KNEE ARTHROPLASTY Left 05/12/2021  ? Procedure: LEFT TOTAL KNEE ARTHROPLASTY;  Surgeon: Leandrew Koyanagi, MD;  Location: [facility];  Service: Orthopedics;  Laterality: Left;  ? ?Patient Active Problem List  ? Diagnosis Date Noted  ? Primary osteoarthritis of left knee 05/12/2021  ? Status post total left knee replacement 05/12/2021  ? Allergic rhinitis 12/09/2020  ? Low serum vitamin B12 12/07/2020  ? Welcome to Medicare preventive visit 12/06/2020  ? Advanced directives, counseling/discussion 12/06/2020  ? Angular cheilitis 12/06/2020  ? 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 Elevated blood pressure reading without diagnosis of hypertension 11/10/2018  ? Renal lesion 03/28/2018  ? Arthralgia of hands, bilateral 09/12/2015  ? OSA on CPAP 09/12/2015  ? Obesity, Class I, BMI 30-34.9 04/05/2014  ? Hyperglycemia  03/30/2013  ? Healthcare maintenance 03/04/2012  ? HLD (hyperlipidemia) 01/18/2009  ? 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 History of seizure 08/15/1991  ? ? ?PCP: Ria Bush, MD ? ?REFERRING PROVIDER: Aundra Dubin, PA-C ? ?REFERRING DIAG: Z02.585 (ICD-10-CM) - Hx of total knee replacement, left  ? ?THERAPY DIAG:  ?Acute pain of left knee ? ?Stiffness of left knee, not elsewhere classified ? ?Muscle weakness (generalized) ? ?Localized edema ? ?Unsteadiness on feet ? ?ONSET DATE: 05/12/2021 ? ?PERTINENT HISTORY: ?arthritis, hypoglemic syndrome, left ear hearing loss, seizures,  ? ?SUBJECTIVE:  ?SUBJECTIVE STATEMENT: ?He relays some intermittent sharp pains last night. ? ?PAIN:  ?Are you having pain? Yes: NPRS scale: 6/10   ?Pain location: knee left medial & lateral down to ankle ?Pain description: throbbing & sharp ?Aggravating factors: laying down & when first moves ?Relieving factors: meds, ice,  ? ?PRECAUTIONS: None ?WEIGHT BEARING RESTRICTIONS No ?PATIENT GOALS   play golf, yard work, Animator work, walk & care for dogs.  ? ?OBJECTIVE:  ? ?PATIENT SURVEYS:  ?FOTO 49% target 66% ? ?EDEMA: ?Right knee above knee 44.8cm  around knee  42.8cm below knee  38.5cm ?Left knee   above knee 51.0 cm around knee 48.6cm  below knee  44.5 cm ? ? ?LE ROM: ? ?Passive(P) & Active(A)  ROM Right ?05/27/2021 Left ?05/27/2021  ?Hip flexion    ?Hip extension    ?Hip abduction    ?Hip adduction    ?Hip internal rotation    ?Hip external rotation    ?Knee flexion  P: supine ?84* ?A: seated 79*  ?Knee extension  P: supine ?-7* ?A: seated LAQ -24*  ?Ankle dorsiflexion    ?Ankle plantarflexion    ?Ankle inversion    ?Ankle eversion    ?(Blank rows = not tested) ? ?LE MMT: ? ?MMT Right ?05/27/2021 Left ?05/27/2021  ?Hip flexion    ?Hip extension    ?Hip abduction    ?Hip adduction    ?Hip internal rotation    ?Hip external rotation    ?Knee flexion  3-/5  ?Knee extension  3-/5  ?Ankle dorsiflexion    ?Ankle plantarflexion     ?Ankle inversion    ?Ankle eversion    ? (Blank rows = not tested) ? ?GAIT:05/27/21 ?Distance walked: 120' ?Assistive device utilized: Single point cane ?Level of assistance: SBA ?Comments: antalgic, decreased stance LLE, left knee flexed in stance and minimal flexion for swing,  hip hiking & circumduction to clear LLE. Step-to pattern.  ? ? ?TODAY'S TREATMENT: ?05/29/21 ?Therapeutic Exercise: ? Aerobic:Nu step UE/LE gentle ROM 8 min L5 ?Supine:quad set TKE 5 sec X10, heelslides 10 X 5 sec ?Prone: ? Seated:LAQ 1.5# 2X10 on left with opposite leg reciprocal curl/extension, Hamstring curl red 2X10 on left, seated hamstring stretch on Lt 3X30 sec ? Standing:Slantboard 30 sec X 3, heel toe raises  X15 ?Neuromuscular Re-education: ?Manual Therapy:supine Lt knee PROM and manual hamstring stretching to tolerance ?Therapeutic Activity: ?Self Care: ?Trigger Point Dry Needling:  ?Modalities: Vasopnuematic to Lt knee 10 min 34 deg medium compression ? ?05/28/21 ?Therapeutic Exercise: ? Aerobic:Nu step UE/LE gentle ROM 8 min L5 ?Supine:quad set TKE 5 sec X10, heelslides 10 X 5 sec ?Prone: ? Seated:LAQ 1.5# 2X10 on Left, Hamstring curl red 2X10 on left ? Standing:Slantboard 30 sec X 3, heel toe raises  X15 ?Neuromuscular Re-education: ?Manual Therapy:supine Lt knee PROM and manual hamstring stretching to tolerance ?Therapeutic Activity: ?Self Care: ?Trigger Point Dry Needling:  ?Modalities: Vasopnuematic to Lt knee 10 min 34 deg medium compression ? ? ?PATIENT EDUCATION:  ?Education details: Access Code: [email or web address] ?Person educated: Patient and Spouse ?Education method: Explanation, Demonstration, Tactile cues, Verbal cues, and Handouts ?Education comprehension: verbalized understanding, returned demonstration, verbal cues required, tactile cues required, and needs further education ? ?HOME EXERCISE PROGRAM: ?Access Code: [email or web address] ?URL: [email or web address] ?Date: 05/27/2021 ?Prepared by: Jamey Reas ? ?Exercises ?Quad Setting and Stretching - 2-4 x daily - 7 x weekly - 5-10 sets - 10 reps - prop 5-10 minutes & quad set5 seconds hold ?Ankle Alphabet in Elevation - 2-4 x daily - 7 x weekly - 1 sets - 1 reps ?Supine Heel Slide with Strap - 2-3 x daily - 7 x weekly - 2-3 sets - 10 reps - 5 seconds hold ?Supine Straight Leg Raises - 2-3 x daily - 7 x weekly - 2-3 sets - 10 reps - 5 seconds hold ?Seated Knee Flexion Extension AROM - 2-4 x daily - 7 x weekly - 2-3 sets - 10 reps - 5 seconds hold ?Seated Hamstring Stretch with Strap - 2-4 x daily - 7 x weekly - 1 sets - 3 reps - 20-30 seconds hold ?Standing Gastroc Stretch - 2-4 x daily - 7 x weekly - 1 sets - 3 reps - 20-30 seconds hold ? ? ?ASSESSMENT: ? ?  CLINICAL IMPRESSION: ?Continued with Lt knee ROM and gentle strengthening to his tolerance. Did not progress this today due to higher pain levels. We will attempt to progress this next week if able. Again used Vasopnuematic at end to control pain and edema.  ? ?OBJECTIVE IMPAIRMENTS Abnormal gait, decreased activity tolerance, decreased balance, decreased endurance, decreased knowledge of use of DME, decreased mobility, decreased ROM, decreased strength, increased edema, impaired flexibility, postural dysfunction, and pain.  ? ?ACTIVITY LIMITATIONS community activity, driving, and yard work.  ? ?PERSONAL FACTORS 1 comorbidity: arthritis, hypoglemic syndrome, left ear hearing loss, seizure last one 61yr ago  are also affecting patient's functional outcome.  ? ?REHAB POTENTIAL: Good ? ?CLINICAL DECISION MAKING: Stable/uncomplicated ? ?EVALUATION COMPLEXITY: Low ? ? ?GOALS: ?Goals reviewed with patient? Yes ? ?SHORT TERM GOALS: Target date: 06/20/2021 ? ?Patient independent and verbalizes compliance with initial HEP ?Baseline: SEE OBJECTIVE DATA ?Goal status: INITIAL ? ?2.  Patient reports 50% improvement in left knee pain. ?Baseline: SEE OBJECTIVE DATA ?Goal status: INITIAL ? ?3.  PROM left knee extension -3*  to flexion 90* ?Baseline: SEE OBJECTIVE DATA ?Goal status: INITIAL ? ?LONG TERM GOALS: Target date: 07/18/2021 ? ?Patient will improve FOTO score to 66% ?Baseline: SEE OBJECTIVE DATA ?Goal status: INITIAL ? ?2.  Patient reports lef

## 2021-06-02 ENCOUNTER — Encounter: Payer: PPO | Admitting: Physical Therapy

## 2021-06-03 ENCOUNTER — Other Ambulatory Visit: Payer: Self-pay

## 2021-06-03 ENCOUNTER — Ambulatory Visit: Payer: PPO | Admitting: Rehabilitative and Restorative Service Providers"

## 2021-06-03 ENCOUNTER — Encounter: Payer: Self-pay | Admitting: Rehabilitative and Restorative Service Providers"

## 2021-06-03 DIAGNOSIS — M6281 Muscle weakness (generalized): Secondary | ICD-10-CM

## 2021-06-03 DIAGNOSIS — M25662 Stiffness of left knee, not elsewhere classified: Secondary | ICD-10-CM | POA: Diagnosis not present

## 2021-06-03 DIAGNOSIS — M25562 Pain in left knee: Secondary | ICD-10-CM | POA: Diagnosis not present

## 2021-06-03 DIAGNOSIS — R2689 Other abnormalities of gait and mobility: Secondary | ICD-10-CM

## 2021-06-03 DIAGNOSIS — R2681 Unsteadiness on feet: Secondary | ICD-10-CM

## 2021-06-03 DIAGNOSIS — R600 Localized edema: Secondary | ICD-10-CM | POA: Diagnosis not present

## 2021-06-03 NOTE — Therapy (Signed)
?OUTPATIENT PHYSICAL THERAPY Treatment ? ? ?Patient Name: Ricky Barton ?MRN: [id number] ?DOB:05-15-1955, 66 y.o., male ?Today's Date: 06/03/2021 ? ? PT End of Session - 06/03/21 1359   ? ? Visit Number 4   ? Number of Visits 18   ? Date for PT Re-Evaluation 07/18/21   ? Authorization Type [employer]   ? Authorization Time Period $30 co-pay   ? Progress Note Due on Visit 10   ? PT Start Time 1341   ? PT Stop Time 3474   ? PT Time Calculation (min) 50 min   ? Activity Tolerance Patient tolerated treatment well   ? Behavior During Therapy [facility] for tasks assessed/performed   ? ?  ?  ? ?  ? ? ? ?Past Medical History:  ?Diagnosis Date  ? Arthritis   ? Hypoglycemic syndrome   ? 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 OSA on CPAP 09/12/2015  ? Severe by HST  ? Seizures ([place]) 1990s  ? isolated, none since, was on dilantin for 3 yrs, then stopped  ? ?Past Surgical History:  ?Procedure Laterality Date  ? CHOLECYSTECTOMY  1989  ? COLONOSCOPY  03/2011  ? 3 adenomatous polyps, rec rpt 5 yrs (Dr Fuller Plan).  ? COLONOSCOPY  07/2015  ? WNL, rpt 5 yrs Fuller Plan)  ? Head MRI  06/02  ? POLYPECTOMY    ? TOTAL KNEE ARTHROPLASTY Left 05/12/2021  ? Procedure: LEFT TOTAL KNEE ARTHROPLASTY;  Surgeon: Leandrew Koyanagi, MD;  Location: [place];  Service: Orthopedics;  Laterality: Left;  ? ?Patient Active Problem List  ? Diagnosis Date Noted  ? Primary osteoarthritis of left knee 05/12/2021  ? Status post total left knee replacement 05/12/2021  ? Allergic rhinitis 12/09/2020  ? Low serum vitamin B12 12/07/2020  ? Welcome to Medicare preventive visit 12/06/2020  ? Advanced directives, counseling/discussion 12/06/2020  ? Angular cheilitis 12/06/2020  ? 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 Elevated blood pressure reading without diagnosis of hypertension 11/10/2018  ? Renal lesion 03/28/2018  ? Arthralgia of hands, bilateral 09/12/2015  ? OSA on CPAP 09/12/2015  ? Obesity, Class I, BMI 30-34.9 04/05/2014  ?  Hyperglycemia 03/30/2013  ? Healthcare maintenance 03/04/2012  ? HLD (hyperlipidemia) 01/18/2009  ? 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 History of seizure 08/15/1991  ? ? ?PCP: Ria Bush, MD ? ?REFERRING PROVIDER: Ria Bush, MD ? ?REFERRING DIAG: Q59.563 (ICD-10-CM) - Hx of total knee replacement, left  ? ?THERAPY DIAG:  ?Acute pain of left knee ? ?Stiffness of left knee, not elsewhere classified ? ?Muscle weakness (generalized) ? ?Localized edema ? ?Unsteadiness on feet ? ?Other abnormalities of gait and mobility ? ?ONSET DATE: 05/12/2021 ? ?PERTINENT HISTORY: ?arthritis, hypoglemic syndrome, left ear hearing loss, seizures,  ? ?SUBJECTIVE:  ?SUBJECTIVE STATEMENT: ?Pt indicated bending continued to be tough. Indicated soreness about 5/10.  ? ?PAIN:  ?Are you having pain? Yes: NPRS scale: 5/10   ?Pain location: knee left medial & lateral down to ankle ?Pain description: throbbing & sharp ?Aggravating factors: tightness c end range movements, general soreness ?Relieving factors: meds, ice ? ?PRECAUTIONS: None ?WEIGHT BEARING RESTRICTIONS No ?PATIENT GOALS   play golf, yard work, Animator work, walk & care for dogs.  ? ?OBJECTIVE:  ? ?PATIENT SURVEYS:  ?05/27/2021 eval: FOTO 49% target 66% ? ?EDEMA: ?05/27/2021 eval: Right knee above knee 44.8cm  around knee  42.8cm below knee  38.5cm ?Left knee   above knee 51.0 cm around knee  48.6cm  below knee  44.5 cm ? ? ?LE ROM: ? ?Passive(P) & Active(A) ROM Right ?05/27/2021 Left ?05/27/2021 Left ?06/03/2021  ?Hip flexion     ?Hip extension     ?Hip abduction     ?Hip adduction     ?Hip internal rotation     ?Hip external rotation     ?Knee flexion  P: supine ?84* ?A: seated 79* Supine AROM heel slide: 88 ?  ?Knee extension  P: supine ?-7* ?A: seated LAQ -24*   ?Ankle dorsiflexion     ?Ankle plantarflexion     ?Ankle inversion     ?Ankle eversion     ?(Blank rows = not tested) ? ?LE MMT: ? ?MMT Right ?05/27/2021 Left ?05/27/2021  ?Hip flexion    ?Hip extension     ?Hip abduction    ?Hip adduction    ?Hip internal rotation    ?Hip external rotation    ?Knee flexion  3-/5  ?Knee extension  3-/5  ?Ankle dorsiflexion    ?Ankle plantarflexion    ?Ankle inversion    ?Ankle eversion    ? (Blank rows = not tested) ? ?GAIT:05/27/21 ?Distance walked: 120' ?Assistive device utilized: Single point cane ?Level of assistance: SBA ?Comments: antalgic, decreased stance LLE, left knee flexed in stance and minimal flexion for swing,  hip hiking & circumduction to clear LLE. Step-to pattern.  ? ? ?TODAY'S TREATMENT: ?06/03/21 ?Therapeutic Exercise: ?  Nu step UE/LE gentle ROM 8 min L5, seat 9 ?Supine heel slide Lt x 10 AROM ?  LAQ x15 on left with opposite leg reciprocal curl/extension ?  Hamstring curl red 2X10 on left, seated hamstring stretch on Lt 3X30 sec ?  Leg press: Double leg 50 lbs x 15, single leg Lt 25 lbs 2 x 15 ?  Incline board stretch bilateral gastroc 30 sec x 3 ? ? ?Manual Therapy:  ?  Seated Lt knee flexion mobilization c movement c IR/distraction to Lt knee c contralateral leg movement opposite. ? ?Modalities: Vasopnuematic to Lt knee 10 min 34 deg medium compression ? ?05/29/21 ?Therapeutic Exercise: ? Aerobic:Nu step UE/LE gentle ROM 8 min L5 ?Supine:quad set TKE 5 sec X10, heelslides 10 X 5 sec ?Prone: ? Seated:LAQ 1.5# 2X10 on left with opposite leg reciprocal curl/extension, Hamstring curl red 2X10 on left, seated hamstring stretch on Lt 3X30 sec ? Standing:Slantboard 30 sec X 3, heel toe raises  X15 ?Neuromuscular Re-education: ?Manual Therapy:supine Lt knee PROM and manual hamstring stretching to tolerance ?Therapeutic Activity: ?Self Care: ?Trigger Point Dry Needling:  ?Modalities: Vasopnuematic to Lt knee 10 min 34 deg medium compression ? ?05/28/21 ?Therapeutic Exercise: ? Aerobic:Nu step UE/LE gentle ROM 8 min L5 ?Supine:quad set TKE 5 sec X10, heelslides 10 X 5 sec ?Prone: ? Seated:LAQ 1.5# 2X10 on Left, Hamstring curl red 2X10 on left ? Standing:Slantboard 30 sec X  3, heel toe raises  X15 ?Neuromuscular Re-education: ?Manual Therapy:supine Lt knee PROM and manual hamstring stretching to tolerance ?Therapeutic Activity: ?Self Care: ?Trigger Point Dry Needling:  ?Modalities: Vasopnuematic to Lt knee 10 min 34 deg medium compression ? ? ?PATIENT EDUCATION:  ?Education details: Access Code: [email or web address] ?Person educated: Patient and Spouse ?Education method: Explanation, Demonstration, Tactile cues, Verbal cues, and Handouts ?Education comprehension: verbalized understanding, returned demonstration, verbal cues required, tactile cues required, and needs further education ? ?HOME EXERCISE PROGRAM: ?Access Code: [email or web address] ?URL: [email or web address] ?Date: 05/27/2021 ?Prepared by: Jamey Reas ? ?Exercises ?Quad Setting and Stretching - 2-4 x daily - 7 x weekly -  5-10 sets - 10 reps - prop 5-10 minutes & quad set5 seconds hold ?Ankle Alphabet in Elevation - 2-4 x daily - 7 x weekly - 1 sets - 1 reps ?Supine Heel Slide with Strap - 2-3 x daily - 7 x weekly - 2-3 sets - 10 reps - 5 seconds hold ?Supine Straight Leg Raises - 2-3 x daily - 7 x weekly - 2-3 sets - 10 reps - 5 seconds hold ?Seated Knee Flexion Extension AROM - 2-4 x daily - 7 x weekly - 2-3 sets - 10 reps - 5 seconds hold ?Seated Hamstring Stretch with Strap - 2-4 x daily - 7 x weekly - 1 sets - 3 reps - 20-30 seconds hold ?Standing Gastroc Stretch - 2-4 x daily - 7 x weekly - 1 sets - 3 reps - 20-30 seconds hold ? ? ?ASSESSMENT: ? ?CLINICAL IMPRESSION: ?Antalgic gait still noted c pressure complaints on Lt leg with use of SPC at this time.  Continued skilled PT services indicated to continue to improve mobility, strength and movement coordination.  ? ?OBJECTIVE IMPAIRMENTS Abnormal gait, decreased activity tolerance, decreased balance, decreased endurance, decreased knowledge of use of DME, decreased mobility, decreased ROM, decreased strength, increased edema, impaired flexibility, postural dysfunction, and  pain.  ? ?ACTIVITY LIMITATIONS community activity, driving, and yard work.  ? ?PERSONAL FACTORS 1 comorbidity: arthritis, hypoglemic syndrome, left ear hearing loss, seizure last one 95yr ago  are also affecting

## 2021-06-04 ENCOUNTER — Ambulatory Visit: Payer: PPO | Admitting: Physical Therapy

## 2021-06-04 ENCOUNTER — Encounter: Payer: Self-pay | Admitting: Physical Therapy

## 2021-06-04 ENCOUNTER — Encounter: Payer: PPO | Admitting: Physical Therapy

## 2021-06-04 DIAGNOSIS — R600 Localized edema: Secondary | ICD-10-CM

## 2021-06-04 DIAGNOSIS — R2681 Unsteadiness on feet: Secondary | ICD-10-CM

## 2021-06-04 DIAGNOSIS — M25662 Stiffness of left knee, not elsewhere classified: Secondary | ICD-10-CM | POA: Diagnosis not present

## 2021-06-04 DIAGNOSIS — M6281 Muscle weakness (generalized): Secondary | ICD-10-CM

## 2021-06-04 DIAGNOSIS — M25562 Pain in left knee: Secondary | ICD-10-CM | POA: Diagnosis not present

## 2021-06-04 NOTE — Therapy (Signed)
?OUTPATIENT PHYSICAL THERAPY Treatment ? ? ?Patient Name: Ricky Barton ?MRN: [id number] ?DOB:December 06, 1955, 66 y.o., male ?Today's Date: 06/04/2021 ? ? PT End of Session - 06/04/21 1225   ? ? Visit Number 5   ? Number of Visits 18   ? Date for PT Re-Evaluation 07/18/21   ? Authorization Type [employer]   ? Authorization Time Period $30 co-pay   ? Progress Note Due on Visit 10   ? PT Start Time 1145   ? PT Stop Time 1233   ? PT Time Calculation (min) 48 min   ? Activity Tolerance Patient tolerated treatment well   ? Behavior During Therapy [facility] for tasks assessed/performed   ? ?  ?  ? ?  ? ? ? ? ?Past Medical History:  ?Diagnosis Date  ? Arthritis   ? Hypoglycemic syndrome   ? 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 OSA on CPAP 09/12/2015  ? Severe by HST  ? Seizures ([place]) 1990s  ? isolated, none since, was on dilantin for 3 yrs, then stopped  ? ?Past Surgical History:  ?Procedure Laterality Date  ? CHOLECYSTECTOMY  1989  ? COLONOSCOPY  03/2011  ? 3 adenomatous polyps, rec rpt 5 yrs (Dr Fuller Plan).  ? COLONOSCOPY  07/2015  ? WNL, rpt 5 yrs Fuller Plan)  ? Head MRI  06/02  ? POLYPECTOMY    ? TOTAL KNEE ARTHROPLASTY Left 05/12/2021  ? Procedure: LEFT TOTAL KNEE ARTHROPLASTY;  Surgeon: Leandrew Koyanagi, MD;  Location: [place];  Service: Orthopedics;  Laterality: Left;  ? ?Patient Active Problem List  ? Diagnosis Date Noted  ? Primary osteoarthritis of left knee 05/12/2021  ? Status post total left knee replacement 05/12/2021  ? Allergic rhinitis 12/09/2020  ? Low serum vitamin B12 12/07/2020  ? Welcome to Medicare preventive visit 12/06/2020  ? Advanced directives, counseling/discussion 12/06/2020  ? Angular cheilitis 12/06/2020  ? 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 Elevated blood pressure reading without diagnosis of hypertension 11/10/2018  ? Renal lesion 03/28/2018  ? Arthralgia of hands, bilateral 09/12/2015  ? OSA on CPAP 09/12/2015  ? Obesity, Class I, BMI 30-34.9 04/05/2014  ?  Hyperglycemia 03/30/2013  ? Healthcare maintenance 03/04/2012  ? HLD (hyperlipidemia) 01/18/2009  ? 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 History of seizure 08/15/1991  ? ? ?PCP: Ria Bush, MD ? ?REFERRING PROVIDER: Ria Bush, MD ? ?REFERRING DIAG: U76.546 (ICD-10-CM) - Hx of total knee replacement, left  ? ?THERAPY DIAG:  ?Acute pain of left knee ? ?Stiffness of left knee, not elsewhere classified ? ?Muscle weakness (generalized) ? ?Localized edema ? ?Unsteadiness on feet ? ?ONSET DATE: 05/12/2021 ? ?PERTINENT HISTORY: ?arthritis, hypoglemic syndrome, left ear hearing loss, seizures,  ? ?SUBJECTIVE:  ?SUBJECTIVE STATEMENT: ?Pt indicated he felt better after last session. Reports soreness and stiffness today ? ?PAIN:  ?Are you having pain? Yes: NPRS scale: 4/10   ?Pain location: knee left medial & lateral down to ankle ?Pain description: soreness and stifffness ?Aggravating factors: tightness c end range movements, general soreness ?Relieving factors: meds, ice ? ?PRECAUTIONS: None ?WEIGHT BEARING RESTRICTIONS No ?PATIENT GOALS   play golf, yard work, Animator work, walk & care for dogs.  ? ?OBJECTIVE:  ? ?PATIENT SURVEYS:  ?05/27/2021 eval: FOTO 49% target 66% ? ?EDEMA: ?05/27/2021 eval: Right knee above knee 44.8cm  around knee  42.8cm below knee  38.5cm ?Left knee   above knee 51.0 cm around knee 48.6cm  below knee  44.5 cm ? ? ?LE ROM: ? ?Passive(P) & Active(A) ROM Right ?05/27/2021 Left ?05/27/2021 Left ?06/03/2021  ?Hip flexion     ?Hip extension     ?Hip abduction     ?Hip adduction     ?Hip internal rotation     ?Hip external rotation     ?Knee flexion  P: supine ?84* ?A: seated 79* Supine AROM heel slide: 88 ?  ?Knee extension  P: supine ?-7* ?A: seated LAQ -24*   ?Ankle dorsiflexion     ?Ankle plantarflexion     ?Ankle inversion     ?Ankle eversion     ?(Blank rows = not tested) ? ?LE MMT: ? ?MMT Right ?05/27/2021 Left ?05/27/2021  ?Hip flexion    ?Hip extension    ?Hip abduction    ?Hip  adduction    ?Hip internal rotation    ?Hip external rotation    ?Knee flexion  3-/5  ?Knee extension  3-/5  ?Ankle dorsiflexion    ?Ankle plantarflexion    ?Ankle inversion    ?Ankle eversion    ? (Blank rows = not tested) ? ?GAIT:05/27/21 ?Distance walked: 120' ?Assistive device utilized: Single point cane ?Level of assistance: SBA ?Comments: antalgic, decreased stance LLE, left knee flexed in stance and minimal flexion for swing,  hip hiking & circumduction to clear LLE. Step-to pattern.  ? ? ?TODAY'S TREATMENT: ?06/04/21 ?Therapeutic Exercise: ?  Nu step UE/LE gentle ROM 8 min L5, seat 9 ?  Incline board stretch bilateral gastroc 30 sec x 3 ?  Standing heel and toe raises X 15 bilat ?  Leg press: Double leg 50 lbs x 15, single leg Lt 25 lbs 2 x 15 ?  LAQ 2X10 2# on left with opposite leg reciprocal curl/extension ?  Seated tailgate stretch for Lt knee flexion 10 sec X 2 min ?  Supine quad set 5 sec X 10 ?Supine heel slide Lt x 10 AAROM holding 5 sec ?  ?Manual Therapy:  ?  Supine Lt knee PROM with overpressure and manual hamstring stretching. ? ?Modalities: Vasopnuematic to Lt knee 10 min 34 deg medium compression ? ? ?06/03/21 ?Therapeutic Exercise: ?  Nu step UE/LE gentle ROM 8 min L5, seat 9 ?Supine heel slide Lt x 10 AROM ?  LAQ x15 on left with opposite leg reciprocal curl/extension ?  Hamstring curl red 2X10 on left, seated hamstring stretch on Lt 3X30 sec ?  Leg press: Double leg 50 lbs x 15, single leg Lt 25 lbs 2 x 15 ?  Incline board stretch bilateral gastroc 30 sec x 3 ? ? ?Manual Therapy:  ?  Seated Lt knee flexion mobilization c movement c IR/distraction to Lt knee c contralateral leg movement opposite. ? ?Modalities: Vasopnuematic to Lt knee 10 min 34 deg medium compression ? ? ?PATIENT EDUCATION:  ?Education details: Access Code: [email or web address] ?Person educated: Patient and Spouse ?Education method: Explanation, Demonstration, Tactile cues, Verbal cues, and Handouts ?Education comprehension: verbalized  understanding, returned demonstration, verbal cues required, tactile cues required, and needs further education ? ?HOME EXERCISE PROGRAM: ?Access Code: [email or web address] ?URL: [email or web address] ?Date: 05/27/2021 ?Prepared by: Jamey Reas ? ?Exercises ?Quad Setting and Stretching - 2-4 x daily - 7 x weekly - 5-10 sets - 10 reps - prop 5-10 minutes & quad set5 seconds hold ?Ankle Alphabet in Elevation - 2-4 x daily - 7 x weekly - 1 sets - 1 reps ?Supine Heel Slide with Strap - 2-3 x daily - 7 x weekly - 2-3 sets -  10 reps - 5 seconds hold ?Supine Straight Leg Raises - 2-3 x daily - 7 x weekly - 2-3 sets - 10 reps - 5 seconds hold ?Seated Knee Flexion Extension AROM - 2-4 x daily - 7 x weekly - 2-3 sets - 10 reps - 5 seconds hold ?Seated Hamstring Stretch with Strap - 2-4 x daily - 7 x weekly - 1 sets - 3 reps - 20-30 seconds hold ?Standing Gastroc Stretch - 2-4 x daily - 7 x weekly - 1 sets - 3 reps - 20-30 seconds hold ? ? ?ASSESSMENT: ? ?CLINICAL IMPRESSION: ?We continue to work to improve his Lt knee strength, ROM, gait and functional abilities to his overall tolerance. Progress has been slow but he is progressing. He still hs significant deficits and will continue to benefit from PT.  ? ?OBJECTIVE IMPAIRMENTS Abnormal gait, decreased activity tolerance, decreased balance, decreased endurance, decreased knowledge of use of DME, decreased mobility, decreased ROM, decreased strength, increased edema, impaired flexibility, postural dysfunction, and pain.  ? ?ACTIVITY LIMITATIONS community activity, driving, and yard work.  ? ?PERSONAL FACTORS 1 comorbidity: arthritis, hypoglemic syndrome, left ear hearing loss, seizure last one 74yr ago  are also affecting patient's functional outcome.  ? ?REHAB POTENTIAL: Good ? ?CLINICAL DECISION MAKING: Stable/uncomplicated ? ?EVALUATION COMPLEXITY: Low ? ? ?GOALS: ?Goals reviewed with patient? Yes ? ?SHORT TERM GOALS: Target date: 06/20/2021 ? ?Patient independent and  verbalizes compliance with initial HEP ? ?Goal status: MET - assessed 06/03/2021 ? ?2.  Patient reports 50% improvement in left knee pain. ? ?Goal status: on going - assessed 06/03/2021 ? ?3.  PROM left knee extension -3*

## 2021-06-05 ENCOUNTER — Encounter: Payer: Self-pay | Admitting: Physical Therapy

## 2021-06-05 ENCOUNTER — Other Ambulatory Visit: Payer: Self-pay

## 2021-06-05 ENCOUNTER — Ambulatory Visit: Payer: PPO | Admitting: Physical Therapy

## 2021-06-05 DIAGNOSIS — M25562 Pain in left knee: Secondary | ICD-10-CM | POA: Diagnosis not present

## 2021-06-05 DIAGNOSIS — M25662 Stiffness of left knee, not elsewhere classified: Secondary | ICD-10-CM | POA: Diagnosis not present

## 2021-06-05 DIAGNOSIS — R600 Localized edema: Secondary | ICD-10-CM | POA: Diagnosis not present

## 2021-06-05 DIAGNOSIS — R2681 Unsteadiness on feet: Secondary | ICD-10-CM | POA: Diagnosis not present

## 2021-06-05 DIAGNOSIS — M6281 Muscle weakness (generalized): Secondary | ICD-10-CM

## 2021-06-05 NOTE — Therapy (Signed)
?OUTPATIENT PHYSICAL THERAPY Treatment ? ? ?Patient Name: Ricky Barton ?MRN: [id number] ?DOB:1955/10/09, 66 y.o., male ?Today's Date: 06/05/2021 ? ? PT End of Session - 06/05/21 1017   ? ? Visit Number 6   ? Number of Visits 18   ? Date for PT Re-Evaluation 07/18/21   ? Authorization Type [employer]   ? Authorization Time Period $30 co-pay   ? Progress Note Due on Visit 10   ? PT Start Time 1012   ? PT Stop Time 1105   ? PT Time Calculation (min) 53 min   ? Activity Tolerance Patient tolerated treatment well   ? Behavior During Therapy [facility] for tasks assessed/performed   ? ?  ?  ? ?  ? ? ? ? ?Past Medical History:  ?Diagnosis Date  ? Arthritis   ? Hypoglycemic syndrome   ? 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 OSA on CPAP 09/12/2015  ? Severe by HST  ? Seizures ([place]) 1990s  ? isolated, none since, was on dilantin for 3 yrs, then stopped  ? ?Past Surgical History:  ?Procedure Laterality Date  ? CHOLECYSTECTOMY  1989  ? COLONOSCOPY  03/2011  ? 3 adenomatous polyps, rec rpt 5 yrs (Dr Fuller Plan).  ? COLONOSCOPY  07/2015  ? WNL, rpt 5 yrs Fuller Plan)  ? Head MRI  06/02  ? POLYPECTOMY    ? TOTAL KNEE ARTHROPLASTY Left 05/12/2021  ? Procedure: LEFT TOTAL KNEE ARTHROPLASTY;  Surgeon: Leandrew Koyanagi, MD;  Location: [place];  Service: Orthopedics;  Laterality: Left;  ? ?Patient Active Problem List  ? Diagnosis Date Noted  ? Primary osteoarthritis of left knee 05/12/2021  ? Status post total left knee replacement 05/12/2021  ? Allergic rhinitis 12/09/2020  ? Low serum vitamin B12 12/07/2020  ? Welcome to Medicare preventive visit 12/06/2020  ? Advanced directives, counseling/discussion 12/06/2020  ? Angular cheilitis 12/06/2020  ? 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 Elevated blood pressure reading without diagnosis of hypertension 11/10/2018  ? Renal lesion 03/28/2018  ? Arthralgia of hands, bilateral 09/12/2015  ? OSA on CPAP 09/12/2015  ? Obesity, Class I, BMI 30-34.9 04/05/2014  ?  Hyperglycemia 03/30/2013  ? Healthcare maintenance 03/04/2012  ? HLD (hyperlipidemia) 01/18/2009  ? 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 History of seizure 08/15/1991  ? ? ?PCP: Ria Bush, MD ? ?REFERRING PROVIDER: Ria Bush, MD ? ?REFERRING DIAG: M57.846 (ICD-10-CM) - Hx of total knee replacement, left  ? ?THERAPY DIAG:  ?Acute pain of left knee ? ?Stiffness of left knee, not elsewhere classified ? ?Muscle weakness (generalized) ? ?Localized edema ? ?Unsteadiness on feet ? ?ONSET DATE: 05/12/2021 ? ?PERTINENT HISTORY: ?arthritis, hypoglemic syndrome, left ear hearing loss, seizures,  ? ?SUBJECTIVE:  ?SUBJECTIVE STATEMENT: ?Pt indicated he has some soreness and stiffness in his Lt knee and he did not sleep very well last night. ? ?PAIN:  ?Are you having pain? Yes: NPRS scale: 4/10   ?Pain location: knee left medial & lateral down to ankle ?Pain description: soreness and stifffness ?Aggravating factors: tightness c end range movements, general soreness ?Relieving factors: meds, ice ? ?PRECAUTIONS: None ?WEIGHT BEARING RESTRICTIONS No ?PATIENT GOALS   play golf, yard work, Animator work, walk & care for dogs.  ? ?OBJECTIVE:  ? ?PATIENT SURVEYS:  ?05/27/2021 eval: FOTO 49% target 66% ? ?EDEMA: ?05/27/2021 eval: Right knee above knee 44.8cm  around knee  42.8cm below knee  38.5cm ?Left knee   above knee 51.0  cm around knee 48.6cm  below knee  44.5 cm ? ? ?LE ROM: ? ?Passive(P) & Active(A) ROM Left ?05/27/2021 Left ?06/03/2021  ?Hip flexion    ?Hip extension    ?Hip abduction    ?Hip adduction    ?Hip internal rotation    ?Hip external rotation    ?Knee flexion P: supine ?84* ?A: seated 79* Supine AROM heel slide: 88 ?  ?Knee extension P: supine ?-7* ?A: seated LAQ -24*   ?Ankle dorsiflexion    ?Ankle plantarflexion    ?Ankle inversion    ?Ankle eversion    ?(Blank rows = not tested) ? ?LE MMT: ? ?MMT Right ?05/27/2021 Left ?05/27/2021 Left ?06/05/21  ?Hip flexion     ?Hip extension     ?Hip abduction      ?Hip adduction     ?Hip internal rotation     ?Hip external rotation     ?Knee flexion  3-/5 4  ?Knee extension  3-/5 4  ?Ankle dorsiflexion     ?Ankle plantarflexion     ?Ankle inversion     ?Ankle eversion     ? (Blank rows = not tested) ? ?GAIT:05/27/21 ?Distance walked: 120' ?Assistive device utilized: Single point cane ?Level of assistance: SBA ?Comments: antalgic, decreased stance LLE, left knee flexed in stance and minimal flexion for swing,  hip hiking & circumduction to clear LLE. Step-to pattern.  ? ? ?TODAY'S TREATMENT: ?06/05/21 ?Therapeutic Exercise: ?  Nu step UE/LE gentle ROM 8 min L5, seat 9 ?  Incline board stretch bilateral gastroc 30 sec x 3 ?  Standing knee flexion lunge stretch 5 sec X 15 ?  Leg press: Double leg 56 lbs 2x10, single leg Lt 25 lbs 2 x 15 ?  LAQ 2X10 2# on left with opposite leg reciprocal curl/extension ?  Seated tailgate stretch for Lt knee flexion 10 sec X 3 min ?  Sit to stands 21.5 inch  ?  ?Manual Therapy:  ?  Supine Lt knee PROM with overpressure and manual hamstring stretching. ? ?Modalities: Vasopnuematic to Lt knee 10 min 34 deg medium compression ?06/04/21 ?Therapeutic Exercise: ?  Nu step UE/LE gentle ROM 8 min L5, seat 9 ?  Incline board stretch bilateral gastroc 30 sec x 3 ?  Standing heel and toe raises X 15 bilat ?  Leg press: Double leg 50 lbs x 15, single leg Lt 25 lbs 2 x 15 ?  LAQ 2X10 2# on left with opposite leg reciprocal curl/extension ?  Seated tailgate stretch for Lt knee flexion 10 sec X 2 min ?  Supine quad set 5 sec X 10 ?Supine heel slide Lt x 10 AAROM holding 5 sec ?  ?Manual Therapy:  ?  Supine Lt knee PROM with overpressure and manual hamstring stretching. ? ?Modalities: Vasopnuematic to Lt knee 10 min 34 deg medium compression ? ? ?PATIENT EDUCATION:  ?Education details: Access Code: [email or web address] ?Person educated: Patient and Spouse ?Education method: Explanation, Demonstration, Tactile cues, Verbal cues, and Handouts ?Education comprehension:  verbalized understanding, returned demonstration, verbal cues required, tactile cues required, and needs further education ? ?HOME EXERCISE PROGRAM: ?Access Code: [email or web address] ?URL: [email or web address] ?Date: 05/27/2021 ?Prepared by: Jamey Reas ? ?Exercises ?Quad Setting and Stretching - 2-4 x daily - 7 x weekly - 5-10 sets - 10 reps - prop 5-10 minutes & quad set5 seconds hold ?Ankle Alphabet in Elevation - 2-4 x daily - 7 x weekly - 1 sets - 1 reps ?Supine Heel Slide with Strap -  2-3 x daily - 7 x weekly - 2-3 sets - 10 reps - 5 seconds hold ?Supine Straight Leg Raises - 2-3 x daily - 7 x weekly - 2-3 sets - 10 reps - 5 seconds hold ?Seated Knee Flexion Extension AROM - 2-4 x daily - 7 x weekly - 2-3 sets - 10 reps - 5 seconds hold ?Seated Hamstring Stretch with Strap - 2-4 x daily - 7 x weekly - 1 sets - 3 reps - 20-30 seconds hold ?Standing Gastroc Stretch - 2-4 x daily - 7 x weekly - 1 sets - 3 reps - 20-30 seconds hold ? ? ?ASSESSMENT: ? ?CLINICAL IMPRESSION: ?He is progressing with knee extension ROM and overall strength but Lt knee flexion is more difficult to progress for him due to pain and stiffness. We will continue to work to improve this as tolerated.  ? ?OBJECTIVE IMPAIRMENTS Abnormal gait, decreased activity tolerance, decreased balance, decreased endurance, decreased knowledge of use of DME, decreased mobility, decreased ROM, decreased strength, increased edema, impaired flexibility, postural dysfunction, and pain.  ? ?ACTIVITY LIMITATIONS community activity, driving, and yard work.  ? ?PERSONAL FACTORS 1 comorbidity: arthritis, hypoglemic syndrome, left ear hearing loss, seizure last one 4yr ago  are also affecting patient's functional outcome.  ? ?REHAB POTENTIAL: Good ? ?CLINICAL DECISION MAKING: Stable/uncomplicated ? ?EVALUATION COMPLEXITY: Low ? ? ?GOALS: ?Goals reviewed with patient? Yes ? ?SHORT TERM GOALS: Target date: 06/20/2021 ? ?Patient independent and verbalizes  compliance with initial HEP ? ?Goal status: MET - assessed 06/03/2021 ? ?2.  Patient reports 50% improvement in left knee pain. ? ?Goal status: on going - assessed 06/03/2021 ? ?3.  PROM left knee extension -3* to flexion 90* ? ?G

## 2021-06-09 ENCOUNTER — Other Ambulatory Visit: Payer: Self-pay

## 2021-06-09 ENCOUNTER — Telehealth: Payer: Self-pay | Admitting: *Deleted

## 2021-06-09 ENCOUNTER — Ambulatory Visit: Payer: PPO | Admitting: Physical Therapy

## 2021-06-09 ENCOUNTER — Encounter: Payer: Self-pay | Admitting: Physical Therapy

## 2021-06-09 DIAGNOSIS — M25562 Pain in left knee: Secondary | ICD-10-CM | POA: Diagnosis not present

## 2021-06-09 DIAGNOSIS — R2681 Unsteadiness on feet: Secondary | ICD-10-CM | POA: Diagnosis not present

## 2021-06-09 DIAGNOSIS — M6281 Muscle weakness (generalized): Secondary | ICD-10-CM | POA: Diagnosis not present

## 2021-06-09 DIAGNOSIS — M25662 Stiffness of left knee, not elsewhere classified: Secondary | ICD-10-CM | POA: Diagnosis not present

## 2021-06-09 DIAGNOSIS — R2689 Other abnormalities of gait and mobility: Secondary | ICD-10-CM

## 2021-06-09 DIAGNOSIS — R600 Localized edema: Secondary | ICD-10-CM | POA: Diagnosis not present

## 2021-06-09 NOTE — Telephone Encounter (Signed)
Sounds like a vicryl suture that's spitting out.  Thanks.

## 2021-06-09 NOTE — Therapy (Addendum)
?OUTPATIENT PHYSICAL THERAPY Treatment ? ? ?Patient Name: Ricky Barton ?MRN: [id number] ?DOB:[birth year], 66 y.o., male ?Today's Date: 06/09/2021 ? ? PT End of Session - 06/09/21 1518   ? ? Visit Number 7   ? Number of Visits 18   ? Date for PT Re-Evaluation 07/18/21   ? Authorization Type [employer]   ? Authorization Time Period $30 co-pay   ? Progress Note Due on Visit 10   ? PT Start Time 1515   ? PT Stop Time 8003   ? PT Time Calculation (min) 62 min   ? Activity Tolerance Patient tolerated treatment well   ? Behavior During Therapy [facility] for tasks assessed/performed   ? ?  ?  ? ?  ? ? ? ? ? ?Past Medical History:  ?Diagnosis Date  ? Arthritis   ? Hypoglycemic syndrome   ? 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 OSA on CPAP 09/12/2015  ? Severe by HST  ? Seizures ([place]) 1990s  ? isolated, none since, was on dilantin for 3 yrs, then stopped  ? ?Past Surgical History:  ?Procedure Laterality Date  ? CHOLECYSTECTOMY  1989  ? COLONOSCOPY  03/2011  ? 3 adenomatous polyps, rec rpt 5 yrs (Dr Fuller Plan).  ? COLONOSCOPY  07/2015  ? WNL, rpt 5 yrs Fuller Plan)  ? Head MRI  06/02  ? POLYPECTOMY    ? TOTAL KNEE ARTHROPLASTY Left 05/12/2021  ? Procedure: LEFT TOTAL KNEE ARTHROPLASTY;  Surgeon: Leandrew Koyanagi, MD;  Location: [place];  Service: Orthopedics;  Laterality: Left;  ? ?Patient Active Problem List  ? Diagnosis Date Noted  ? Primary osteoarthritis of left knee 05/12/2021  ? Status post total left knee replacement 05/12/2021  ? Allergic rhinitis 12/09/2020  ? Low serum vitamin B12 12/07/2020  ? Welcome to Medicare preventive visit 12/06/2020  ? Advanced directives, counseling/discussion 12/06/2020  ? Angular cheilitis 12/06/2020  ? 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 Elevated blood pressure reading without diagnosis of hypertension 11/10/2018  ? Renal lesion 03/28/2018  ? Arthralgia of hands, bilateral 09/12/2015  ? OSA on CPAP 09/12/2015  ? Obesity, Class I, BMI 30-34.9 04/05/2014  ?  Hyperglycemia 03/30/2013  ? Healthcare maintenance 03/04/2012  ? HLD (hyperlipidemia) 01/18/2009  ? 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 History of seizure 08/15/1991  ? ? ?PCP: Ria Bush, MD ? ?REFERRING PROVIDER: Aundra Dubin, PA-C ? ?REFERRING DIAG: K91.791 (ICD-10-CM) - Hx of total knee replacement, left  ? ?THERAPY DIAG:  ?Acute pain of left knee ? ?Stiffness of left knee, not elsewhere classified ? ?Muscle weakness (generalized) ? ?Localized edema ? ?Unsteadiness on feet ? ?Other abnormalities of gait and mobility ? ?ONSET DATE: 05/12/2021 ? ?PERTINENT HISTORY: ?arthritis, hypoglemic syndrome, left ear hearing loss, seizures,  ? ?SUBJECTIVE:  ?SUBJECTIVE STATEMENT: ?He tried bed a couple of nights but can not get comfortable.  He went back to recliner.   ? ?PAIN:  ?Are you having pain? Yes: NPRS scale:  5/10  since last PT lowest 2/10 and highest 10/10 ?Pain location: knee left medial & lateral down to ankle ?Pain description: soreness and stifffness ?Aggravating factors: tightness c end range movements, general soreness, worst middle of night. ?Relieving factors: meds, ice ? ?PRECAUTIONS: None ?WEIGHT BEARING RESTRICTIONS No ?PATIENT GOALS   play golf, yard work, Animator work, walk & care for dogs.  ? ?OBJECTIVE:  ? ?PATIENT SURVEYS:  ?05/27/2021 eval: FOTO 49% target 66% ? ?EDEMA: ?05/27/2021 eval: Right  knee above knee 44.8cm  around knee  42.8cm below knee  38.5cm ?Left knee   above knee 51.0 cm around knee 48.6cm  below knee  44.5 cm ? ? ?LE ROM: ? ?Passive(P) & Active(A) ROM Left ?05/27/21 Left ?06/03/21 Left ?06/09/21  ?Hip flexion     ?Hip extension     ?Hip abduction     ?Hip adduction     ?Hip internal rotation     ?Hip external rotation     ?Knee flexion P: supine ?84* ?A: seated 79* Supine AROM heel slide: 88 ? Seated ?P:98*  ?Knee extension P: supine ?-7* ?A: seated LAQ -24*  P: supine ?-3*  ?Ankle dorsiflexion     ?Ankle plantarflexion     ?Ankle inversion     ?Ankle eversion      ?(Blank rows = not tested) ? ?LE MMT: ? ?MMT Right ?05/27/2021 Left ?05/27/2021 Left ?06/05/21  ?Hip flexion     ?Hip extension     ?Hip abduction     ?Hip adduction     ?Hip internal rotation     ?Hip external rotation     ?Knee flexion  3-/5 4  ?Knee extension  3-/5 4  ?Ankle dorsiflexion     ?Ankle plantarflexion     ?Ankle inversion     ?Ankle eversion     ? (Blank rows = not tested) ? ?GAIT:05/27/21 ?Distance walked: 120' ?Assistive device utilized: Single point cane ?Level of assistance: SBA ?Comments: antalgic, decreased stance LLE, left knee flexed in stance and minimal flexion for swing,  hip hiking & circumduction to clear LLE. Step-to pattern.  ? ? ?TODAY'S TREATMENT: ?06/09/21 TREATMENT ?Therapeutic Exercise: ? SciFit bike seat 9 rocking back & forth for 8 min ? Incline board stretch bilateral gastroc 30 sec x 3 ? Hamstring stretch SLR w/ strap DF 30 sec hold 2 reps ? Leg press: Double leg 75 lbs 2x10, single leg Lt 31 lbs 2 x 10 ? LAQ 2X10 2# LLE ? Sit to / from stand 18" chair 5 reps with PT demo & verbal cues on technique. ? ?Neuromuscular Re-ed: working SLS with tennis ball rolls - stance on RLE (coordination LLE) without UE support and stance on LLE with single finger tip support - ant/post, med/lat and circles CW / CCW 10 reps each.  ?  ?Manual Therapy:  ?Grade II mob seated for flex with traction & tibial int rot and contract-relax antagonist & agonist. Supine grade II for ext with femoral int rot.  ? ?Modalities: Vasopnuematic to Lt knee 10 min 34 deg medium compression ? ?06/05/21 ?Therapeutic Exercise: ?  Nu step UE/LE gentle ROM 8 min L5, seat 9 ?  Incline board stretch bilateral gastroc 30 sec x 3 ?  Standing knee flexion lunge stretch 5 sec X 15 ?  Leg press: Double leg 56 lbs 2x10, single leg Lt 25 lbs 2 x 15 ?  LAQ 2X10 2# on left with opposite leg reciprocal curl/extension ?  Seated tailgate stretch for Lt knee flexion 10 sec X 3 min ?  Sit to stands 21.5 inch  ?  ?Manual Therapy:  ?  Supine  Lt knee PROM with overpressure and manual hamstring stretching. ? ?Modalities: Vasopnuematic to Lt knee 10 min 34 deg medium compression ? ?06/04/21 ?Therapeutic Exercise: ?  Nu step UE/LE gentle ROM 8 min L5, seat 9 ?  Incline board stretch bilateral gastroc 30 sec x 3 ?  Standing heel and toe raises X 15 bilat ?  Leg press: Double leg 50 lbs x 15, single leg Lt 25 lbs 2 x 15 ?  LAQ 2X10 2# on left with opposite leg reciprocal curl/extension ?  Seated tailgate stretch for Lt knee flexion 10 sec X 2 min ?  Supine quad set 5 sec X 10 ?Supine heel slide Lt x 10 AAROM holding 5 sec ?  ?Manual Therapy:  ?  Supine Lt knee PROM with overpressure and manual hamstring stretching. ? ?Modalities: Vasopnuematic to Lt knee 10 min 34 deg medium compression ? ? ?PATIENT EDUCATION:  ?Education details: Access Code: [email or web address] ?Person educated: Patient and Spouse ?Education method: Explanation, Demonstration, Tactile cues, Verbal cues, and Handouts ?Education comprehension: verbalized understanding, returned demonstration, verbal cues required, tactile cues required, and needs further education ? ?HOME EXERCISE PROGRAM: ?Access Code: [email or web address] ?URL: [email or web address] ?Date: 05/27/2021 ?Prepared by: Jamey Reas ? ?Exercises ?Quad Setting and Stretching - 2-4 x daily - 7 x weekly - 5-10 sets - 10 reps - prop 5-10 minutes & quad set5 seconds hold ?Ankle Alphabet in Elevation - 2-4 x daily - 7 x weekly - 1 sets - 1 reps ?Supine Heel Slide with Strap - 2-3 x daily - 7 x weekly - 2-3 sets - 10 reps - 5 seconds hold ?Supine Straight Leg Raises - 2-3 x daily - 7 x weekly - 2-3 sets - 10 reps - 5 seconds hold ?Seated Knee Flexion Extension AROM - 2-4 x daily - 7 x weekly - 2-3 sets - 10 reps - 5 seconds hold ?Seated Hamstring Stretch with Strap - 2-4 x daily - 7 x weekly - 1 sets - 3 reps - 20-30 seconds hold ?Standing Gastroc Stretch - 2-4 x daily - 7 x weekly - 1 sets - 3 reps - 20-30 seconds  hold ? ? ?ASSESSMENT: ? ?CLINICAL IMPRESSION: ?Patient's range is slowly improving.  He met [facility]G for PROM today.  His pain & edema has improved but continues to limit his motion & function. Patient continues to benefit from skilled PT.  ? ?OBJ

## 2021-06-09 NOTE — Telephone Encounter (Signed)
Patient would like refill of pain medication. Thank you, ?

## 2021-06-09 NOTE — Telephone Encounter (Signed)
Patient called today stating he has a small area of incision that had a scab that came off and there is a small white "string" that is sticking through skin. Wife attempted to pull with tweezers and got part of it out, but there is still some under skin. It is extremely tender he states. I will see about getting him an appointment for some time tomorrow for someone to see it. Thank you.  ?

## 2021-06-10 ENCOUNTER — Ambulatory Visit (INDEPENDENT_AMBULATORY_CARE_PROVIDER_SITE_OTHER): Payer: PPO | Admitting: Orthopaedic Surgery

## 2021-06-10 ENCOUNTER — Encounter: Payer: Self-pay | Admitting: Orthopaedic Surgery

## 2021-06-10 ENCOUNTER — Other Ambulatory Visit: Payer: Self-pay | Admitting: Physician Assistant

## 2021-06-10 DIAGNOSIS — Z96652 Presence of left artificial knee joint: Secondary | ICD-10-CM

## 2021-06-10 MED ORDER — OXYCODONE-ACETAMINOPHEN 5-325 MG PO TABS: 1.0000 | ORAL_TABLET | Freq: Three times a day (TID) | ORAL | 0 refills | Status: DC | PRN

## 2021-06-10 NOTE — Progress Notes (Signed)
? ?Post-Op Visit Note ?  ?Patient: Ricky Barton           ?Date of Birth: [birth year]           ?MRN: [id number] ?Visit Date: 06/10/2021 ?PCP: Ria Bush, MD ? ? ?Assessment & Plan: ? ?Chief Complaint:  ?Chief Complaint  ?Patient presents with  ? Left Knee - Pain  ? ?Visit Diagnoses:  ?1. S/P total knee replacement, left   ? ? ?Plan: Ricky Barton comes in today for Vicryl stitch that is coming through the incision.  He is 4 weeks status post left total knee replacement.  He is doing well in that regard.  I pulled as much of the Vicryl stitch out of the incision as possible and that I cut this short.  Betadine was applied to the hole.  He is to put Neosporin on this area with a Band-Aid twice a day.  Follow-up as scheduled with two-view x-rays of the left knee. ? ?Follow-Up Instructions: No follow-ups on file.  ? ?Orders:  ?No orders of the defined types were placed in this encounter. ? ?No orders of the defined types were placed in this encounter. ? ? ?Imaging: ?No results found. ? ?PMFS History: ?Patient Active Problem List  ? Diagnosis Date Noted  ? Primary osteoarthritis of left knee 05/12/2021  ? Status post total left knee replacement 05/12/2021  ? Allergic rhinitis 12/09/2020  ? Low serum vitamin B12 12/07/2020  ? Welcome to Medicare preventive visit 12/06/2020  ? Advanced directives, counseling/discussion 12/06/2020  ? Angular cheilitis 12/06/2020  ? 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 Elevated blood pressure reading without diagnosis of hypertension 11/10/2018  ? Renal lesion 03/28/2018  ? Arthralgia of hands, bilateral 09/12/2015  ? OSA on CPAP 09/12/2015  ? Obesity, Class I, BMI 30-34.9 04/05/2014  ? Hyperglycemia 03/30/2013  ? Healthcare maintenance 03/04/2012  ? HLD (hyperlipidemia) 01/18/2009  ? 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 History of seizure 08/15/1991  ? ?Past Medical History:  ?Diagnosis Date  ? Arthritis   ? Hypoglycemic syndrome   ? 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 Present  since infant ?congenital   ? OSA on CPAP 09/12/2015  ? Severe by HST  ? Seizures ([place]) 1990s  ? isolated, none since, was on dilantin for 3 yrs, then stopped  ?  ?Family History  ?Problem Relation Age of Onset  ? Hypertension Mother   ? Colon polyps Mother   ? Cancer Father 74  ?     stomach cancer  ? Hyperlipidemia Brother   ? CAD Brother 43  ?     MI (smoker)  ? Hyperlipidemia Brother   ? Sleep apnea Brother   ? Pancreatic cancer Brother 34  ? Hyperlipidemia Brother   ? CAD Brother 69  ?     MI (smoker)  ? Hyperlipidemia Brother   ? Multiple sclerosis Brother   ? Colon cancer Neg Hx   ? Esophageal cancer Neg Hx   ? Rectal cancer Neg Hx   ?  ?Past Surgical History:  ?Procedure Laterality Date  ? CHOLECYSTECTOMY  1989  ? COLONOSCOPY  03/2011  ? 3 adenomatous polyps, rec rpt 5 yrs (Dr Fuller Plan).  ? COLONOSCOPY  07/2015  ? WNL, rpt 5 yrs Fuller Plan)  ? Head MRI  06/02  ? POLYPECTOMY    ? TOTAL KNEE ARTHROPLASTY Left 05/12/2021  ? Procedure: LEFT TOTAL KNEE ARTHROPLASTY;  Surgeon: Leandrew Koyanagi, MD;  Location: [place];  Service: Orthopedics;  Laterality: Left;  ? ?Social History  ? ?Occupational History  ? Occupation: Art therapist  ?  Employer: [employer]  ?  Comment: [employer]  ?Tobacco Use  ? Smoking status: Never  ? Smokeless tobacco: Never  ?Vaping Use  ? Vaping Use: Never used  ?Substance and Sexual Activity  ? Alcohol use: No  ? Drug use: No  ? Sexual activity: Yes  ? ? ? ?

## 2021-06-10 NOTE — Telephone Encounter (Signed)
Sent in oxy

## 2021-06-11 ENCOUNTER — Ambulatory Visit: Payer: PPO | Admitting: Physical Therapy

## 2021-06-11 ENCOUNTER — Encounter: Payer: Self-pay | Admitting: Physical Therapy

## 2021-06-11 DIAGNOSIS — R2689 Other abnormalities of gait and mobility: Secondary | ICD-10-CM | POA: Diagnosis not present

## 2021-06-11 DIAGNOSIS — R600 Localized edema: Secondary | ICD-10-CM

## 2021-06-11 DIAGNOSIS — M25562 Pain in left knee: Secondary | ICD-10-CM | POA: Diagnosis not present

## 2021-06-11 DIAGNOSIS — R2681 Unsteadiness on feet: Secondary | ICD-10-CM | POA: Diagnosis not present

## 2021-06-11 DIAGNOSIS — M25662 Stiffness of left knee, not elsewhere classified: Secondary | ICD-10-CM | POA: Diagnosis not present

## 2021-06-11 DIAGNOSIS — M6281 Muscle weakness (generalized): Secondary | ICD-10-CM

## 2021-06-11 NOTE — Therapy (Signed)
?OUTPATIENT PHYSICAL THERAPY Treatment ? ? ?Patient Name: Ricky Barton ?MRN: [id number] ?DOB:04-07-1955, 66 y.o., male ?Today's Date: 06/11/2021 ? ? PT End of Session - 06/11/21 1259   ? ? Visit Number 8   ? Number of Visits 18   ? Date for PT Re-Evaluation 07/18/21   ? Authorization Type [employer]   ? Authorization Time Period $30 co-pay   ? Progress Note Due on Visit 10   ? PT Start Time 1259   ? PT Stop Time 1355   ? PT Time Calculation (min) 56 min   ? Activity Tolerance Patient tolerated treatment well   ? Behavior During Therapy [facility] for tasks assessed/performed   ? ?  ?  ? ?  ? ? ? ? ? ? ?Past Medical History:  ?Diagnosis Date  ? Arthritis   ? Hypoglycemic syndrome   ? 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 OSA on CPAP 09/12/2015  ? Severe by HST  ? Seizures ([place]) 1990s  ? isolated, none since, was on dilantin for 3 yrs, then stopped  ? ?Past Surgical History:  ?Procedure Laterality Date  ? CHOLECYSTECTOMY  1989  ? COLONOSCOPY  03/2011  ? 3 adenomatous polyps, rec rpt 5 yrs (Dr Fuller Plan).  ? COLONOSCOPY  07/2015  ? WNL, rpt 5 yrs Fuller Plan)  ? Head MRI  06/02  ? POLYPECTOMY    ? TOTAL KNEE ARTHROPLASTY Left 05/12/2021  ? Procedure: LEFT TOTAL KNEE ARTHROPLASTY;  Surgeon: Leandrew Koyanagi, MD;  Location: [place];  Service: Orthopedics;  Laterality: Left;  ? ?Patient Active Problem List  ? Diagnosis Date Noted  ? Primary osteoarthritis of left knee 05/12/2021  ? Status post total left knee replacement 05/12/2021  ? Allergic rhinitis 12/09/2020  ? Low serum vitamin B12 12/07/2020  ? Welcome to Medicare preventive visit 12/06/2020  ? Advanced directives, counseling/discussion 12/06/2020  ? Angular cheilitis 12/06/2020  ? 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 Elevated blood pressure reading without diagnosis of hypertension 11/10/2018  ? Renal lesion 03/28/2018  ? Arthralgia of hands, bilateral 09/12/2015  ? OSA on CPAP 09/12/2015  ? Obesity, Class I, BMI 30-34.9 04/05/2014  ?  Hyperglycemia 03/30/2013  ? Healthcare maintenance 03/04/2012  ? HLD (hyperlipidemia) 01/18/2009  ? 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 History of seizure 08/15/1991  ? ? ?PCP: Ria Bush, MD ? ?REFERRING PROVIDER: Aundra Dubin, PA-C ? ?REFERRING DIAG: T06.269 (ICD-10-CM) - Hx of total knee replacement, left  ? ?THERAPY DIAG:  ?Acute pain of left knee ? ?Stiffness of left knee, not elsewhere classified ? ?Muscle weakness (generalized) ? ?Localized edema ? ?Unsteadiness on feet ? ?Other abnormalities of gait and mobility ? ?ONSET DATE: 05/12/2021 ? ?PERTINENT HISTORY: ?arthritis, hypoglemic syndrome, left ear hearing loss, seizures,  ? ?SUBJECTIVE:  ?SUBJECTIVE STATEMENT: ?He had suture removed yesterday.  He continues to do his exercises.  He is elevating LE also.  ? ?PAIN:  ?Are you having pain? Yes: NPRS scale:   5-6/10  and last week lowest 2/10 and highest 10/10 ?Pain location: knee left medial & lateral down to ankle ?Pain description: soreness and stifffness ?Aggravating factors: tightness c end range movements, general soreness, worst middle of night. ?Relieving factors: meds, ice ? ?PRECAUTIONS: None ?WEIGHT BEARING RESTRICTIONS No ?PATIENT GOALS   play golf, yard work, Animator work, walk & care for dogs.  ? ?OBJECTIVE:  ? ?PATIENT SURVEYS:  ?05/27/2021 eval: FOTO 49% target 66% ? ?EDEMA: ?05/27/2021 eval:  Right knee above knee 44.8cm  around knee  42.8cm below knee  38.5cm ?Left knee   above knee 51.0 cm around knee 48.6cm  below knee  44.5 cm ? ? ?LE ROM: ? ?Passive(P) & Active(A) ROM Left ?05/27/21 Left ?06/03/21 Left ?06/09/21  ?Hip flexion     ?Hip extension     ?Hip abduction     ?Hip adduction     ?Hip internal rotation     ?Hip external rotation     ?Knee flexion P: supine ?84* ?A: seated 79* Supine AROM heel slide: 88 ? Seated ?P:98*  ?Knee extension P: supine ?-7* ?A: seated LAQ -24*  P: supine ?-3*  ?Ankle dorsiflexion     ?Ankle plantarflexion     ?Ankle inversion     ?Ankle  eversion     ?(Blank rows = not tested) ? ?LE MMT: ? ?MMT Right ?05/27/2021 Left ?05/27/2021 Left ?06/05/21  ?Hip flexion     ?Hip extension     ?Hip abduction     ?Hip adduction     ?Hip internal rotation     ?Hip external rotation     ?Knee flexion  3-/5 4  ?Knee extension  3-/5 4  ?Ankle dorsiflexion     ?Ankle plantarflexion     ?Ankle inversion     ?Ankle eversion     ? (Blank rows = not tested) ? ?GAIT:05/27/21 ?Distance walked: 120' ?Assistive device utilized: Single point cane ?Level of assistance: SBA ?Comments: antalgic, decreased stance LLE, left knee flexed in stance and minimal flexion for swing,  hip hiking & circumduction to clear LLE. Step-to pattern.  ? ? ?TODAY'S TREATMENT: ?06/11/2021 TREATMENT ?Therapeutic Exercise: ? SciFit bike seat 9 rocking back & forth for 8 min ? Stretch gastroc step heel depression 30 sec x 2 ? Hamstring stretch SLR w/ strap DF 30 sec hold 2 reps ? Leg press: Double leg 81# 2 sets x15reps, single leg Lt 37# 10 reps, then 31# 10 reps ? Step Up & Down: BUE (rail & cane) 4" step 10 reps.  PT demo & verbal cues on technique.  ? LAQ 2 X 15 2.5# LLE ? Sit to / from stand 18" chair 5 reps with PT demo & verbal cues on technique. ? ?Neuromuscular Re-ed:   ?Manual Therapy:  ?Grade II mob seated for flex with traction & tibial int rot and contract-relax antagonist & agonist. Supine grade II for ext with femoral int rot.  ? ?Modalities: Vasopnuematic to Lt knee 10 min 34 deg medium compression with LLE intermittent prop for ext.  ? ?06/09/21 TREATMENT ?Therapeutic Exercise: ? SciFit bike seat 9 rocking back & forth for 8 min ? Incline board stretch bilateral gastroc 30 sec x 3 ? Hamstring stretch SLR w/ strap DF 30 sec hold 2 reps ? Leg press: Double leg 75 lbs 2x10, single leg Lt 31 lbs 2 x 10 ? LAQ 2X10 2# LLE ? Sit to / from stand 18" chair 5 reps with PT demo & verbal cues on technique. ? ?Neuromuscular Re-ed: working SLS with tennis ball rolls - stance on RLE (coordination LLE)  without UE support and stance on LLE with single finger tip support - ant/post, med/lat and circles CW / CCW 10 reps each.  ?  ?Manual Therapy:  ?Grade II mob seated for flex with traction & tibial int rot and contract-relax antagonist & agonist. Supine grade II for ext with femoral int rot.  ? ?Modalities: Vasopnuematic to Lt knee 10 min 34  deg medium compression ? ?06/05/21 ?Therapeutic Exercise: ?  Nu step UE/LE gentle ROM 8 min L5, seat 9 ?  Incline board stretch bilateral gastroc 30 sec x 3 ?  Standing knee flexion lunge stretch 5 sec X 15 ?  Leg press: Double leg 56 lbs 2x10, single leg Lt 25 lbs 2 x 15 ?  LAQ 2X10 2# on left with opposite leg reciprocal curl/extension ?  Seated tailgate stretch for Lt knee flexion 10 sec X 3 min ?  Sit to stands 21.5 inch  ?  ?Manual Therapy:  ?  Supine Lt knee PROM with overpressure and manual hamstring stretching. ? ?Modalities: Vasopnuematic to Lt knee 10 min 34 deg medium compression ? ? ?PATIENT EDUCATION:  ?Education details: Access Code: [email or web address] ?Person educated: Patient and Spouse ?Education method: Explanation, Demonstration, Tactile cues, Verbal cues, and Handouts ?Education comprehension: verbalized understanding, returned demonstration, verbal cues required, tactile cues required, and needs further education ? ?HOME EXERCISE PROGRAM: ?Access Code: [email or web address] ?URL: [email or web address] ?Date: 05/27/2021 ?Prepared by: Jamey Reas ? ?Exercises ?Quad Setting and Stretching - 2-4 x daily - 7 x weekly - 5-10 sets - 10 reps - prop 5-10 minutes & quad set5 seconds hold ?Ankle Alphabet in Elevation - 2-4 x daily - 7 x weekly - 1 sets - 1 reps ?Supine Heel Slide with Strap - 2-3 x daily - 7 x weekly - 2-3 sets - 10 reps - 5 seconds hold ?Supine Straight Leg Raises - 2-3 x daily - 7 x weekly - 2-3 sets - 10 reps - 5 seconds hold ?Seated Knee Flexion Extension AROM - 2-4 x daily - 7 x weekly - 2-3 sets - 10 reps - 5 seconds hold ?Seated Hamstring Stretch with  Strap - 2-4 x daily - 7 x weekly - 1 sets - 3 reps - 20-30 seconds hold ?Standing Gastroc Stretch - 2-4 x daily - 7 x weekly - 1 sets - 3 reps - 20-30 seconds hold ? ? ?ASSESSMENT: ?CLINICAL IMPRESSION: ?Patient is toler

## 2021-06-12 ENCOUNTER — Other Ambulatory Visit: Payer: Self-pay | Admitting: Physician Assistant

## 2021-06-16 ENCOUNTER — Encounter: Payer: Self-pay | Admitting: Physical Therapy

## 2021-06-16 ENCOUNTER — Ambulatory Visit: Payer: PPO | Admitting: Physical Therapy

## 2021-06-16 DIAGNOSIS — M25662 Stiffness of left knee, not elsewhere classified: Secondary | ICD-10-CM | POA: Diagnosis not present

## 2021-06-16 DIAGNOSIS — R600 Localized edema: Secondary | ICD-10-CM | POA: Diagnosis not present

## 2021-06-16 DIAGNOSIS — M25562 Pain in left knee: Secondary | ICD-10-CM | POA: Diagnosis not present

## 2021-06-16 DIAGNOSIS — R2681 Unsteadiness on feet: Secondary | ICD-10-CM

## 2021-06-16 DIAGNOSIS — R2689 Other abnormalities of gait and mobility: Secondary | ICD-10-CM | POA: Diagnosis not present

## 2021-06-16 DIAGNOSIS — M6281 Muscle weakness (generalized): Secondary | ICD-10-CM | POA: Diagnosis not present

## 2021-06-16 NOTE — Therapy (Signed)
?OUTPATIENT PHYSICAL THERAPY Treatment ? ? ?Patient Name: Ricky Barton ?MRN: [id number] ?DOB:1955/07/16, 66 y.o., male ?Today's Date: 06/16/2021 ? ? PT End of Session - 06/16/21 1301   ? ? Visit Number 9   ? Number of Visits 18   ? Date for PT Re-Evaluation 07/18/21   ? Authorization Type [employer]   ? Authorization Time Period $30 co-pay   ? Progress Note Due on Visit 10   ? PT Start Time 1300   ? PT Stop Time 1348   ? PT Time Calculation (min) 48 min   ? Activity Tolerance Patient tolerated treatment well   ? Behavior During Therapy [facility] for tasks assessed/performed   ? ?  ?  ? ?  ? ? ? ? ? ? ? ?Past Medical History:  ?Diagnosis Date  ? Arthritis   ? Hypoglycemic syndrome   ? 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 OSA on CPAP 09/12/2015  ? Severe by HST  ? Seizures ([place]) 1990s  ? isolated, none since, was on dilantin for 3 yrs, then stopped  ? ?Past Surgical History:  ?Procedure Laterality Date  ? CHOLECYSTECTOMY  1989  ? COLONOSCOPY  03/2011  ? 3 adenomatous polyps, rec rpt 5 yrs (Dr Fuller Plan).  ? COLONOSCOPY  07/2015  ? WNL, rpt 5 yrs Fuller Plan)  ? Head MRI  06/02  ? POLYPECTOMY    ? TOTAL KNEE ARTHROPLASTY Left 05/12/2021  ? Procedure: LEFT TOTAL KNEE ARTHROPLASTY;  Surgeon: Leandrew Koyanagi, MD;  Location: [place];  Service: Orthopedics;  Laterality: Left;  ? ?Patient Active Problem List  ? Diagnosis Date Noted  ? Primary osteoarthritis of left knee 05/12/2021  ? Status post total left knee replacement 05/12/2021  ? Allergic rhinitis 12/09/2020  ? Low serum vitamin B12 12/07/2020  ? Welcome to Medicare preventive visit 12/06/2020  ? Advanced directives, counseling/discussion 12/06/2020  ? Angular cheilitis 12/06/2020  ? 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 Elevated blood pressure reading without diagnosis of hypertension 11/10/2018  ? Renal lesion 03/28/2018  ? Arthralgia of hands, bilateral 09/12/2015  ? OSA on CPAP 09/12/2015  ? Obesity, Class I, BMI 30-34.9 04/05/2014  ?  Hyperglycemia 03/30/2013  ? Healthcare maintenance 03/04/2012  ? HLD (hyperlipidemia) 01/18/2009  ? 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 History of seizure 08/15/1991  ? ? ?PCP: Ria Bush, MD ? ?REFERRING PROVIDER: Aundra Dubin, PA-C ? ?REFERRING DIAG: K93.818 (ICD-10-CM) - Hx of total knee replacement, left  ? ?THERAPY DIAG:  ?Acute pain of left knee ? ?Stiffness of left knee, not elsewhere classified ? ?Muscle weakness (generalized) ? ?Localized edema ? ?Unsteadiness on feet ? ?Other abnormalities of gait and mobility ? ?ONSET DATE: 05/12/2021 ? ?PERTINENT HISTORY: ?arthritis, hypoglemic syndrome, left ear hearing loss, seizures,  ? ?SUBJECTIVE:  ?SUBJECTIVE STATEMENT: ?Still having trouble sleeping, but slowly improving ? ? ?PAIN:  ?Are you having pain? Yes: NPRS scale:   4-5/10  and last week lowest 2/10 and highest 10/10 ?Pain location: knee left medial & lateral down to ankle ?Pain description: soreness and stifffness ?Aggravating factors: tightness c end range movements, general soreness, worst middle of night. ?Relieving factors: meds, ice ? ?PRECAUTIONS: None ?WEIGHT BEARING RESTRICTIONS No ?PATIENT GOALS   play golf, yard work, Animator work, walk & care for dogs.  ? ?OBJECTIVE:  ? ?PATIENT SURVEYS:  ?05/27/2021 eval: FOTO 49% target 66% ? ?EDEMA: ?05/27/2021 eval: Right knee above knee 44.8cm  around knee  42.8cm  below knee  38.5cm ?Left knee   above knee 51.0 cm around knee 48.6cm  below knee  44.5 cm ? ? ?LE ROM: ? ?Passive(P) & Active(A) ROM Left ?05/27/21 Left ?06/03/21 Left ?06/09/21  ?Hip flexion     ?Hip extension     ?Hip abduction     ?Hip adduction     ?Hip internal rotation     ?Hip external rotation     ?Knee flexion P: supine ?84* ?A: seated 79* Supine AROM heel slide: 88 ? Seated ?P:98*  ?Knee extension P: supine ?-7* ?A: seated LAQ -24*  P: supine ?-3*  ?Ankle dorsiflexion     ?Ankle plantarflexion     ?Ankle inversion     ?Ankle eversion     ?(Blank rows = not tested) ? ?LE  MMT: ? ?MMT Right ?05/27/2021 Left ?05/27/2021 Left ?06/05/21  ?Hip flexion     ?Hip extension     ?Hip abduction     ?Hip adduction     ?Hip internal rotation     ?Hip external rotation     ?Knee flexion  3-/5 4  ?Knee extension  3-/5 4  ?Ankle dorsiflexion     ?Ankle plantarflexion     ?Ankle inversion     ?Ankle eversion     ? (Blank rows = not tested) ? ?GAIT:05/27/21 ?Distance walked: 120' ?Assistive device utilized: Single point cane ?Level of assistance: SBA ?Comments: antalgic, decreased stance LLE, left knee flexed in stance and minimal flexion for swing,  hip hiking & circumduction to clear LLE. Step-to pattern.  ? ? ?TODAY'S TREATMENT: ?06/16/21 ?Therex: ?     Aerobic: ?NuStep seat 9, L5 x 8 min ?    Machines: ?Leg Press 81# 2x15 (1 min flexion hold between sets); LLE only 37# 2x15 ?    Standing: ?Incline board gastroc stretch 3x30 sec; cues for quad set to maximize knee extension ?    Sitting: ?AA Lt knee flexion 10 x 10 sec hold, RLE providing assist ? ?Manual: ?Lt knee flexion focus in sitting with contract/relax ?Modalities: ?Vaso med pressure, Lt knee x 10 min; 34 deg ? ? ? ?06/11/2021 TREATMENT ?Therapeutic Exercise: ? SciFit bike seat 9 rocking back & forth for 8 min ? Stretch gastroc step heel depression 30 sec x 2 ? Hamstring stretch SLR w/ strap DF 30 sec hold 2 reps ? Leg press: Double leg 81# 2 sets x15reps, single leg Lt 37# 10 reps, then 31# 10 reps ? Step Up & Down: BUE (rail & cane) 4" step 10 reps.  PT demo & verbal cues on technique.  ? LAQ 2 X 15 2.5# LLE ? Sit to / from stand 18" chair 5 reps with PT demo & verbal cues on technique. ? ?Neuromuscular Re-ed:   ?Manual Therapy:  ?Grade II mob seated for flex with traction & tibial int rot and contract-relax antagonist & agonist. Supine grade II for ext with femoral int rot.  ? ?Modalities: Vasopnuematic to Lt knee 10 min 34 deg medium compression with LLE intermittent prop for ext.  ? ?06/09/21 TREATMENT ?Therapeutic Exercise: ? SciFit bike seat  9 rocking back & forth for 8 min ? Incline board stretch bilateral gastroc 30 sec x 3 ? Hamstring stretch SLR w/ strap DF 30 sec hold 2 reps ? Leg press: Double leg 75 lbs 2x10, single leg Lt 31 lbs 2 x 10 ? LAQ 2X10 2# LLE ? Sit to / from stand 18" chair 5 reps with PT demo &  verbal cues on technique. ? ?Neuromuscular Re-ed: working SLS with tennis ball rolls - stance on RLE (coordination LLE) without UE support and stance on LLE with single finger tip support - ant/post, med/lat and circles CW / CCW 10 reps each.  ?  ?Manual Therapy:  ?Grade II mob seated for flex with traction & tibial int rot and contract-relax antagonist & agonist. Supine grade II for ext with femoral int rot.  ? ?Modalities: Vasopnuematic to Lt knee 10 min 34 deg medium compression ? ? ? ?PATIENT EDUCATION:  ?Education details: Access Code: [email or web address] ?Person educated: Patient and Spouse ?Education method: Explanation, Demonstration, Tactile cues, Verbal cues, and Handouts ?Education comprehension: verbalized understanding, returned demonstration, verbal cues required, tactile cues required, and needs further education ? ?HOME EXERCISE PROGRAM: ?Access Code: [email or web address] ?URL: [email or web address] ?Date: 05/27/2021 ?Prepared by: Jamey Reas ? ?Exercises ?Quad Setting and Stretching - 2-4 x daily - 7 x weekly - 5-10 sets - 10 reps - prop 5-10 minutes & quad set5 seconds hold ?Ankle Alphabet in Elevation - 2-4 x daily - 7 x weekly - 1 sets - 1 reps ?Supine Heel Slide with Strap - 2-3 x daily - 7 x weekly - 2-3 sets - 10 reps - 5 seconds hold ?Supine Straight Leg Raises - 2-3 x daily - 7 x weekly - 2-3 sets - 10 reps - 5 seconds hold ?Seated Knee Flexion Extension AROM - 2-4 x daily - 7 x weekly - 2-3 sets - 10 reps - 5 seconds hold ?Seated Hamstring Stretch with Strap - 2-4 x daily - 7 x weekly - 1 sets - 3 reps - 20-30 seconds hold ?Standing Gastroc Stretch - 2-4 x daily - 7 x weekly - 1 sets - 3 reps - 20-30 seconds  hold ? ? ?ASSESSMENT: ?CLINICAL IMPRESSION: ?Pt tolerated session well today needing continued focus on strengthening and ROM.  Overall progressing well with PT and will benefit from continued PT to maximize function.  Pain

## 2021-06-18 ENCOUNTER — Encounter: Payer: Self-pay | Admitting: Physical Therapy

## 2021-06-18 ENCOUNTER — Ambulatory Visit: Payer: PPO | Admitting: Physical Therapy

## 2021-06-18 DIAGNOSIS — M6281 Muscle weakness (generalized): Secondary | ICD-10-CM

## 2021-06-18 DIAGNOSIS — M25662 Stiffness of left knee, not elsewhere classified: Secondary | ICD-10-CM

## 2021-06-18 DIAGNOSIS — R600 Localized edema: Secondary | ICD-10-CM | POA: Diagnosis not present

## 2021-06-18 DIAGNOSIS — M25562 Pain in left knee: Secondary | ICD-10-CM | POA: Diagnosis not present

## 2021-06-18 DIAGNOSIS — R2689 Other abnormalities of gait and mobility: Secondary | ICD-10-CM

## 2021-06-18 DIAGNOSIS — R2681 Unsteadiness on feet: Secondary | ICD-10-CM

## 2021-06-18 NOTE — Therapy (Signed)
?OUTPATIENT PHYSICAL THERAPY Treatment ?Progress Note ?Reporting Period 05/27/21 to 06/18/21 ? ?See note below for Objective Data and Assessment of Progress/Goals.  ? ? ? ? ? ?Patient Name: Ricky Barton ?MRN: [id number] ?DOB:1955/07/22, 66 y.o., male ?Today's Date: 06/18/2021 ? ? PT End of Session - 06/18/21 1346   ? ? Visit Number 10   ? Number of Visits 18   ? Date for PT Re-Evaluation 07/18/21   ? Authorization Type [employer]   ? Authorization Time Period $30 co-pay   ? Progress Note Due on Visit 20   ? PT Start Time 1345   ? PT Stop Time 1426   ? PT Time Calculation (min) 41 min   ? Activity Tolerance Patient tolerated treatment well   ? Behavior During Therapy [facility] for tasks assessed/performed   ? ?  ?  ? ?  ? ? ? ? ? ? ? ? ?Past Medical History:  ?Diagnosis Date  ? Arthritis   ? Hypoglycemic syndrome   ? 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 OSA on CPAP 09/12/2015  ? Severe by HST  ? Seizures ([place]) 1990s  ? isolated, none since, was on dilantin for 3 yrs, then stopped  ? ?Past Surgical History:  ?Procedure Laterality Date  ? CHOLECYSTECTOMY  1989  ? COLONOSCOPY  03/2011  ? 3 adenomatous polyps, rec rpt 5 yrs (Dr Fuller Plan).  ? COLONOSCOPY  07/2015  ? WNL, rpt 5 yrs Fuller Plan)  ? Head MRI  06/02  ? POLYPECTOMY    ? TOTAL KNEE ARTHROPLASTY Left 05/12/2021  ? Procedure: LEFT TOTAL KNEE ARTHROPLASTY;  Surgeon: Leandrew Koyanagi, MD;  Location: [facility];  Service: Orthopedics;  Laterality: Left;  ? ?Patient Active Problem List  ? Diagnosis Date Noted  ? Primary osteoarthritis of left knee 05/12/2021  ? Status post total left knee replacement 05/12/2021  ? Allergic rhinitis 12/09/2020  ? Low serum vitamin B12 12/07/2020  ? Welcome to Medicare preventive visit 12/06/2020  ? Advanced directives, counseling/discussion 12/06/2020  ? Angular cheilitis 12/06/2020  ? 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 Elevated blood pressure reading without diagnosis of hypertension 11/10/2018  ? Renal lesion  03/28/2018  ? Arthralgia of hands, bilateral 09/12/2015  ? OSA on CPAP 09/12/2015  ? Obesity, Class I, BMI 30-34.9 04/05/2014  ? Hyperglycemia 03/30/2013  ? Healthcare maintenance 03/04/2012  ? HLD (hyperlipidemia) 01/18/2009  ? 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 History of seizure 08/15/1991  ? ? ?PCP: Ria Bush, MD ? ?REFERRING PROVIDER: Aundra Dubin, PA-C ? ?REFERRING DIAG: P79.480 (ICD-10-CM) - Hx of total knee replacement, left  ? ?THERAPY DIAG:  ?Acute pain of left knee ? ?Stiffness of left knee, not elsewhere classified ? ?Muscle weakness (generalized) ? ?Localized edema ? ?Unsteadiness on feet ? ?Other abnormalities of gait and mobility ? ?ONSET DATE: 05/12/2021 ? ?PERTINENT HISTORY: ?arthritis, hypoglemic syndrome, left ear hearing loss, seizures,  ? ?SUBJECTIVE:  ?SUBJECTIVE STATEMENT: ?Sore and hurting after last session, but it's okay  ? ? ?PAIN:  ?Are you having pain? Yes: NPRS scale:   5-6/10  and last week lowest 2/10 and highest 10/10 ?Pain location: knee left medial & lateral down to ankle ?Pain description: soreness and stifffness ?Aggravating factors: tightness c end range movements, general soreness, worst middle of night. ?Relieving factors: meds, ice ? ?PRECAUTIONS: None ?WEIGHT BEARING RESTRICTIONS No ?PATIENT GOALS   play golf, yard work, Animator work, walk & care for dogs.  ? ?  OBJECTIVE:  ? ?PATIENT SURVEYS:  ?05/27/2021 eval: FOTO 49% target 66% ?06/18/21 FOTO 47 ? ?EDEMA: ?05/27/2021 eval: Right knee above knee 44.8cm  around knee  42.8cm below knee  38.5cm ?Left knee   above knee 51.0 cm around knee 48.6cm  below knee  44.5 cm ? ? ?LE ROM: ? ?Passive(P) & Active(A) ROM Left ?05/27/21 Left ?06/03/21 Left ?06/09/21 Left ?06/18/21  ?Hip flexion      ?Hip extension      ?Hip abduction      ?Hip adduction      ?Hip internal rotation      ?Hip external rotation      ?Knee flexion P: supine ?84* ?A: seated 79* Supine AROM heel slide: 88 ? Seated ?P:98* Supine: ?P: 95 ?A: 85 (92 after  exercises)  ?Knee extension P: supine ?-7* ?A: seated LAQ -24*  P: supine ?-3* P:-3 ?(Supine) ?A: -9 (seated LAQ)  ?Ankle dorsiflexion      ?Ankle plantarflexion      ?Ankle inversion      ?Ankle eversion      ?(Blank rows = not tested) ? ?LE MMT: ? ?MMT Right ?05/27/2021 Left ?05/27/2021 Left ?06/05/21  ?Hip flexion     ?Hip extension     ?Hip abduction     ?Hip adduction     ?Hip internal rotation     ?Hip external rotation     ?Knee flexion  3-/5 4  ?Knee extension  3-/5 4  ?Ankle dorsiflexion     ?Ankle plantarflexion     ?Ankle inversion     ?Ankle eversion     ? (Blank rows = not tested) ? ?GAIT:05/27/21 ?Distance walked: 120' ?Assistive device utilized: Single point cane ?Level of assistance: SBA ?Comments: antalgic, decreased stance LLE, left knee flexed in stance and minimal flexion for swing,  hip hiking & circumduction to clear LLE. Step-to pattern.  ? ? ?TODAY'S TREATMENT: ?06/18/21 ?Therex: ?     Aerobic: ?NuStep seat 9, L5 x 8 min ?     Supine: ?AA heel slides with red physioball and strap 20 reps x 10 sec hold each ?     Sitting: ?AA Lt knee flexion 10 x 10 sec hold, RLE providing assist ? ?06/16/21 ?Therex: ?     Aerobic: ?NuStep seat 9, L5 x 8 min ?    Machines: ?Leg Press 81# 2x15 (1 min flexion hold between sets); LLE only 37# 2x15 ?    Standing: ?Incline board gastroc stretch 3x30 sec; cues for quad set to maximize knee extension ?    Sitting: ?AA Lt knee flexion 10 x 10 sec hold, RLE providing assist ? ?Manual: ?Lt knee flexion focus in sitting with contract/relax ?Modalities: ?Vaso med pressure, Lt knee x 10 min; 34 deg ? ? ? ?06/11/2021 TREATMENT ?Therapeutic Exercise: ? SciFit bike seat 9 rocking back & forth for 8 min ? Stretch gastroc step heel depression 30 sec x 2 ? Hamstring stretch SLR w/ strap DF 30 sec hold 2 reps ? Leg press: Double leg 81# 2 sets x15reps, single leg Lt 37# 10 reps, then 31# 10 reps ? Step Up & Down: BUE (rail & cane) 4" step 10 reps.  PT demo & verbal cues on technique.   ? LAQ 2 X 15 2.5# LLE ? Sit to / from stand 18" chair 5 reps with PT demo & verbal cues on technique. ? ?Neuromuscular Re-ed:   ?Manual Therapy:  ?Grade II mob seated for flex with traction & tibial  int rot and contract-relax antagonist & agonist. Supine grade II for ext with femoral int rot.  ? ?Modalities: Vasopnuematic to Lt knee 10 min 34 deg medium compression with LLE intermittent prop for ext.  ? ?06/09/21 TREATMENT ?Therapeutic Exercise: ? SciFit bike seat 9 rocking back & forth for 8 min ? Incline board stretch bilateral gastroc 30 sec x 3 ? Hamstring stretch SLR w/ strap DF 30 sec hold 2 reps ? Leg press: Double leg 75 lbs 2x10, single leg Lt 31 lbs 2 x 10 ? LAQ 2X10 2# LLE ? Sit to / from stand 18" chair 5 reps with PT demo & verbal cues on technique. ? ?Neuromuscular Re-ed: working SLS with tennis ball rolls - stance on RLE (coordination LLE) without UE support and stance on LLE with single finger tip support - ant/post, med/lat and circles CW / CCW 10 reps each.  ?  ?Manual Therapy:  ?Grade II mob seated for flex with traction & tibial int rot and contract-relax antagonist & agonist. Supine grade II for ext with femoral int rot.  ? ?Modalities: Vasopnuematic to Lt knee 10 min 34 deg medium compression ? ? ? ?PATIENT EDUCATION:  ?Education details: Access Code: [email or web address] ?Person educated: Patient and Spouse ?Education method: Explanation, Demonstration, Tactile cues, Verbal cues, and Handouts ?Education comprehension: verbalized understanding, returned demonstration, verbal cues required, tactile cues required, and needs further education ? ?HOME EXERCISE PROGRAM: ?Access Code: [email or web address] ?URL: [email or web address] ?Date: 05/27/2021 ?Prepared by: Jamey Reas ? ?Exercises ?Quad Setting and Stretching - 2-4 x daily - 7 x weekly - 5-10 sets - 10 reps - prop 5-10 minutes & quad set5 seconds hold ?Ankle Alphabet in Elevation - 2-4 x daily - 7 x weekly - 1 sets - 1 reps ?Supine Heel Slide with  Strap - 2-3 x daily - 7 x weekly - 2-3 sets - 10 reps - 5 seconds hold ?Supine Straight Leg Raises - 2-3 x daily - 7 x weekly - 2-3 sets - 10 reps - 5 seconds hold ?Seated Knee Flexion Extension AROM - 2-4 x

## 2021-06-23 ENCOUNTER — Encounter: Payer: Self-pay | Admitting: Physical Therapy

## 2021-06-23 ENCOUNTER — Ambulatory Visit: Payer: PPO | Admitting: Physical Therapy

## 2021-06-23 DIAGNOSIS — M6281 Muscle weakness (generalized): Secondary | ICD-10-CM

## 2021-06-23 DIAGNOSIS — R2681 Unsteadiness on feet: Secondary | ICD-10-CM | POA: Diagnosis not present

## 2021-06-23 DIAGNOSIS — M25662 Stiffness of left knee, not elsewhere classified: Secondary | ICD-10-CM

## 2021-06-23 DIAGNOSIS — R2689 Other abnormalities of gait and mobility: Secondary | ICD-10-CM | POA: Diagnosis not present

## 2021-06-23 DIAGNOSIS — R600 Localized edema: Secondary | ICD-10-CM

## 2021-06-23 DIAGNOSIS — M25562 Pain in left knee: Secondary | ICD-10-CM | POA: Diagnosis not present

## 2021-06-23 NOTE — Therapy (Addendum)
?OUTPATIENT PHYSICAL THERAPY Treatment ? ? ? ? ? ?Patient Name: Ricky Barton ?MRN: [id number] ?DOB:Jan 07, 1956, 66 y.o., male ?Today's Date: 06/23/2021 ? ? PT End of Session - 06/23/21 1259   ? ? Visit Number 11   ? Number of Visits 18   ? Date for PT Re-Evaluation 07/18/21   ? Authorization Type [employer]   ? Authorization Time Period $30 co-pay   ? Progress Note Due on Visit 20   ? PT Start Time 1301   ? PT Stop Time 5284   ? PT Time Calculation (min) 51 min   ? Activity Tolerance Patient tolerated treatment well   ? Behavior During Therapy [facility] for tasks assessed/performed   ? ?  ?  ? ?  ? ? ? ? ? ? ? ? ? ?Past Medical History:  ?Diagnosis Date  ? Arthritis   ? Hypoglycemic syndrome   ? 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 OSA on CPAP 09/12/2015  ? Severe by HST  ? Seizures ([place]) 1990s  ? isolated, none since, was on dilantin for 3 yrs, then stopped  ? ?Past Surgical History:  ?Procedure Laterality Date  ? CHOLECYSTECTOMY  1989  ? COLONOSCOPY  03/2011  ? 3 adenomatous polyps, rec rpt 5 yrs (Dr Fuller Plan).  ? COLONOSCOPY  07/2015  ? WNL, rpt 5 yrs Fuller Plan)  ? Head MRI  06/02  ? POLYPECTOMY    ? TOTAL KNEE ARTHROPLASTY Left 05/12/2021  ? Procedure: LEFT TOTAL KNEE ARTHROPLASTY;  Surgeon: Leandrew Koyanagi, MD;  Location: [facility];  Service: Orthopedics;  Laterality: Left;  ? ?Patient Active Problem List  ? Diagnosis Date Noted  ? Primary osteoarthritis of left knee 05/12/2021  ? Status post total left knee replacement 05/12/2021  ? Allergic rhinitis 12/09/2020  ? Low serum vitamin B12 12/07/2020  ? Welcome to Medicare preventive visit 12/06/2020  ? Advanced directives, counseling/discussion 12/06/2020  ? Angular cheilitis 12/06/2020  ? 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 Elevated blood pressure reading without diagnosis of hypertension 11/10/2018  ? Renal lesion 03/28/2018  ? Arthralgia of hands, bilateral 09/12/2015  ? OSA on CPAP 09/12/2015  ? Obesity, Class I, BMI 30-34.9  04/05/2014  ? Hyperglycemia 03/30/2013  ? Healthcare maintenance 03/04/2012  ? HLD (hyperlipidemia) 01/18/2009  ? 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 History of seizure 08/15/1991  ? ? ?PCP: Ria Bush, MD ? ?REFERRING PROVIDER: Aundra Dubin, PA-C ? ?REFERRING DIAG: X32.440 (ICD-10-CM) - Hx of total knee replacement, left  ? ?THERAPY DIAG:  ?Acute pain of left knee ? ?Stiffness of left knee, not elsewhere classified ? ?Muscle weakness (generalized) ? ?Localized edema ? ?Unsteadiness on feet ? ?Other abnormalities of gait and mobility ? ?ONSET DATE: 05/12/2021 ? ?PERTINENT HISTORY: ?arthritis, hypoglemic syndrome, left ear hearing loss, seizures,  ? ?SUBJECTIVE:  ?SUBJECTIVE STATEMENT: ?Soreness is improving, but still having some pain.  ? ? ?PAIN:  ?Are you having pain? Yes: NPRS scale:   5/10  and last week lowest 2/10 and highest 10/10 ?Pain location: knee left medial & lateral down to ankle ?Pain description: soreness and stifffness ?Aggravating factors: tightness c end range movements, general soreness, worst middle of night. ?Relieving factors: meds, ice ? ?PRECAUTIONS: None ?WEIGHT BEARING RESTRICTIONS No ?PATIENT GOALS   play golf, yard work, Animator work, walk & care for dogs.  ? ?OBJECTIVE:  ? ?PATIENT SURVEYS:  ?05/27/2021 eval: FOTO 49% target 66% ?06/18/21 FOTO 47 ? ?EDEMA: ?05/27/2021 eval:  Right knee above knee 44.8cm  around knee  42.8cm below knee  38.5cm ?Left knee   above knee 51.0 cm around knee 48.6cm  below knee  44.5 cm ?4/10: Lt knee around knee 47.7 cm ? ?LE ROM: ? ?Passive(P) & Active(A) ROM Left ?05/27/21 Left ?06/03/21 Left ?06/09/21 Left ?06/18/21   ?Hip flexion       ?Hip extension       ?Hip abduction       ?Hip adduction       ?Hip internal rotation       ?Hip external rotation       ?Knee flexion P: supine ?84* ?A: seated 79* Supine AROM heel slide: 88 ? Seated ?P:98* Supine: ?P: 95 ?A: 85 (92 after exercises) Supine: ?A 93  ?Knee extension P: supine ?-7* ?A: seated LAQ -24*   P: supine ?-3* P:-3 ?(Supine) ?A: -9 (seated LAQ)   ?Ankle dorsiflexion       ?Ankle plantarflexion       ?Ankle inversion       ?Ankle eversion       ?(Blank rows = not tested) ? ?LE MMT: ? ?MMT Right ?05/27/2021 Left ?05/27/2021 Left ?06/05/21  ?Hip flexion     ?Hip extension     ?Hip abduction     ?Hip adduction     ?Hip internal rotation     ?Hip external rotation     ?Knee flexion  3-/5 4  ?Knee extension  3-/5 4  ?Ankle dorsiflexion     ?Ankle plantarflexion     ?Ankle inversion     ?Ankle eversion     ? (Blank rows = not tested) ? ?GAIT:05/27/21 ?Distance walked: 120' ?Assistive device utilized: Single point cane ?Level of assistance: SBA ?Comments: antalgic, decreased stance LLE, left knee flexed in stance and minimal flexion for swing,  hip hiking & circumduction to clear LLE. Step-to pattern.  ? ? ?TODAY'S TREATMENT: ?06/23/21 ?Therex: ?     Aerobic: ?NuStep seat 9, L7 x 8 min ?     Standing: ?AA knee flexion on 8" step 10 x 10 sec hold on Lt ?Forward step ups 6" step x 10 reps with 1 UE support ?RLE heel taps with LLE on step x 10 reps; heavy reliance on bil UEs ?      Sitting: ?Lt LAQ 4# 2x10 ? ?Manual: ? PROM: Seated Lt knee flexion with active Rt knee flexion to tolerance ?Self-Care ? Discussed edema strategies, recommend thigh high compression stockings to help with edema and see if that will help increased flexion ROM ? ?Modalities: ?Vaso x 10 min, med pressure, 34 deg to Lt knee ? ?06/18/21 ?Therex: ?     Aerobic: ?NuStep seat 9, L5 x 8 min ?     Supine: ?AA heel slides with red physioball and strap 20 reps x 10 sec hold each ?     Sitting: ?AA Lt knee flexion 10 x 10 sec hold, RLE providing assist ? ?06/16/21 ?Therex: ?     Aerobic: ?NuStep seat 9, L5 x 8 min ?    Machines: ?Leg Press 81# 2x15 (1 min flexion hold between sets); LLE only 37# 2x15 ?    Standing: ?Incline board gastroc stretch 3x30 sec; cues for quad set to maximize knee extension ?    Sitting: ?AA Lt knee flexion 10 x 10 sec hold, RLE  providing assist ? ?Manual: ?Lt knee flexion focus in sitting with contract/relax ?Modalities: ?Vaso med pressure, Lt knee x 10 min; 34  deg ? ? ?PATIENT EDUCATION:  ?Education details: Access Code: [email or web address] ?Person educated: Patient and Spouse ?Education method: Explanation, Demonstration, Tactile cues, Verbal cues, and Handouts ?Education comprehension: verbalized understanding, returned demonstration, verbal cues required, tactile cues required, and needs further education ? ?HOME EXERCISE PROGRAM: ?Access Code: [email or web address] ?URL: [email or web address] ?Date: 05/27/2021 ?Prepared by: Jamey Reas ? ?Exercises ?Quad Setting and Stretching - 2-4 x daily - 7 x weekly - 5-10 sets - 10 reps - prop 5-10 minutes & quad set5 seconds hold ?Ankle Alphabet in Elevation - 2-4 x daily - 7 x weekly - 1 sets - 1 reps ?Supine Heel Slide with Strap - 2-3 x daily - 7 x weekly - 2-3 sets - 10 reps - 5 seconds hold ?Supine Straight Leg Raises - 2-3 x daily - 7 x weekly - 2-3 sets - 10 reps - 5 seconds hold ?Seated Knee Flexion Extension AROM - 2-4 x daily - 7 x weekly - 2-3 sets - 10 reps - 5 seconds hold ?Seated Hamstring Stretch with Strap - 2-4 x daily - 7 x weekly - 1 sets - 3 reps - 20-30 seconds hold ?Standing Gastroc Stretch - 2-4 x daily - 7 x weekly - 1 sets - 3 reps - 20-30 seconds hold ? ? ?ASSESSMENT: ?CLINICAL IMPRESSION: ?Pt with slight improvement in active knee flexion today but continues to be limited, with swelling impacting ability to flex knee.  Recommend stockings to help with swelling and see if that can increase flexion.  Pt to MD next week. ? ? ?OBJECTIVE IMPAIRMENTS Abnormal gait, decreased activity tolerance, decreased balance, decreased endurance, decreased knowledge of use of DME, decreased mobility, decreased ROM, decreased strength, increased edema, impaired flexibility, postural dysfunction, and pain.  ? ?ACTIVITY LIMITATIONS community activity, driving, and yard work.  ? ?PERSONAL FACTORS 1  comorbidity: arthritis, hypoglemic syndrome, left ear hearing loss, seizure last one 5yr ago  are also affecting patient's functional outcome.  ? ?REHAB POTENTIAL: Good ? ?CLINICAL DECISION MAKING: Stable/uncom

## 2021-06-25 ENCOUNTER — Ambulatory Visit: Payer: PPO | Admitting: Physical Therapy

## 2021-06-25 ENCOUNTER — Other Ambulatory Visit: Payer: Self-pay

## 2021-06-25 ENCOUNTER — Encounter: Payer: Self-pay | Admitting: Physical Therapy

## 2021-06-25 DIAGNOSIS — M25562 Pain in left knee: Secondary | ICD-10-CM | POA: Diagnosis not present

## 2021-06-25 DIAGNOSIS — R2689 Other abnormalities of gait and mobility: Secondary | ICD-10-CM | POA: Diagnosis not present

## 2021-06-25 DIAGNOSIS — R2681 Unsteadiness on feet: Secondary | ICD-10-CM

## 2021-06-25 DIAGNOSIS — M6281 Muscle weakness (generalized): Secondary | ICD-10-CM | POA: Diagnosis not present

## 2021-06-25 DIAGNOSIS — R600 Localized edema: Secondary | ICD-10-CM | POA: Diagnosis not present

## 2021-06-25 DIAGNOSIS — M25662 Stiffness of left knee, not elsewhere classified: Secondary | ICD-10-CM

## 2021-06-25 NOTE — Therapy (Signed)
?OUTPATIENT PHYSICAL THERAPY Treatment ? ? ? ? ? ?Patient Name: Ricky Barton ?MRN: [id number] ?DOB:October 23, 1955, 66 y.o., male ?Today's Date: 06/25/2021 ? ? PT End of Session - 06/25/21 1258   ? ? Visit Number 12   ? Number of Visits 18   ? Date for PT Re-Evaluation 07/18/21   ? Authorization Type [employer]   ? Authorization Time Period $30 co-pay   ? Progress Note Due on Visit 20   ? PT Start Time 1258   ? PT Stop Time 1355   ? PT Time Calculation (min) 57 min   ? Activity Tolerance Patient tolerated treatment well   ? Behavior During Therapy [facility] for tasks assessed/performed   ? ?  ?  ? ?  ? ? ? ? ? ? ? ? ? ? ?Past Medical History:  ?Diagnosis Date  ? Arthritis   ? Hypoglycemic syndrome   ? 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 OSA on CPAP 09/12/2015  ? Severe by HST  ? Seizures ([place]) 1990s  ? isolated, none since, was on dilantin for 3 yrs, then stopped  ? ?Past Surgical History:  ?Procedure Laterality Date  ? CHOLECYSTECTOMY  1989  ? COLONOSCOPY  03/2011  ? 3 adenomatous polyps, rec rpt 5 yrs (Dr Fuller Plan).  ? COLONOSCOPY  07/2015  ? WNL, rpt 5 yrs Fuller Plan)  ? Head MRI  06/02  ? POLYPECTOMY    ? TOTAL KNEE ARTHROPLASTY Left 05/12/2021  ? Procedure: LEFT TOTAL KNEE ARTHROPLASTY;  Surgeon: Leandrew Koyanagi, MD;  Location: [place];  Service: Orthopedics;  Laterality: Left;  ? ?Patient Active Problem List  ? Diagnosis Date Noted  ? Primary osteoarthritis of left knee 05/12/2021  ? Status post total left knee replacement 05/12/2021  ? Allergic rhinitis 12/09/2020  ? Low serum vitamin B12 12/07/2020  ? Welcome to Medicare preventive visit 12/06/2020  ? Advanced directives, counseling/discussion 12/06/2020  ? Angular cheilitis 12/06/2020  ? 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 Elevated blood pressure reading without diagnosis of hypertension 11/10/2018  ? Renal lesion 03/28/2018  ? Arthralgia of hands, bilateral 09/12/2015  ? OSA on CPAP 09/12/2015  ? Obesity, Class I, BMI 30-34.9  04/05/2014  ? Hyperglycemia 03/30/2013  ? Healthcare maintenance 03/04/2012  ? HLD (hyperlipidemia) 01/18/2009  ? 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 History of seizure 08/15/1991  ? ? ?PCP: Ria Bush, MD ? ?REFERRING PROVIDER: Aundra Dubin, PA-C ? ?REFERRING DIAG: G28.366 (ICD-10-CM) - Hx of total knee replacement, left  ? ?THERAPY DIAG:  ?Acute pain of left knee ? ?Stiffness of left knee, not elsewhere classified ? ?Muscle weakness (generalized) ? ?Localized edema ? ?Unsteadiness on feet ? ?Other abnormalities of gait and mobility ? ?ONSET DATE: 05/12/2021 ? ?PERTINENT HISTORY: ?arthritis, hypoglemic syndrome, left ear hearing loss, seizures,  ? ?SUBJECTIVE:  ?SUBJECTIVE STATEMENT: ?He continues to do his exercises. The trick of leaning forward with seated heel slides seems to help.   ? ?PAIN:  ?Are you having pain? Yes: NPRS scale: upon arrival 3-4/10  and over last week lowest 2/10 and highest 8-10/10 ?Pain location: knee left medial & lateral down to ankle ?Pain description: soreness and stifffness ?Aggravating factors: tightness c end range movements, general soreness, worst middle of night. ?Relieving factors: meds, ice ? ?PRECAUTIONS: None ?WEIGHT BEARING RESTRICTIONS No ?PATIENT GOALS   play golf, yard work, Animator work, walk & care for dogs.  ? ?OBJECTIVE:  ? ?PATIENT SURVEYS:  ?05/27/2021  eval: FOTO 49% target 66% ?06/18/21 FOTO 47 ? ?EDEMA: ?05/27/2021 eval: Right knee above knee 44.8cm  around knee  42.8cm below knee  38.5cm ?Left knee   above knee 51.0 cm around knee 48.6cm  below knee  44.5 cm ?4/10: Lt knee around knee 47.7 cm ? ?LE ROM: ? ?Passive(P) & Active(A) ROM Left ?05/27/21 Left ?06/03/21 Left ?06/09/21 Left ?06/18/21 Left ?06/23/21  ?Hip flexion       ?Hip extension       ?Hip abduction       ?Hip adduction       ?Hip internal rotation       ?Hip external rotation       ?Knee flexion P: supine ?84* ?A: seated 79* Supine AROM heel slide: 88 ? Seated ?P:98* Supine: ?P: 95 ?A: 85 (92  after exercises) Supine: ?A 93  ?Knee extension P: supine ?-7* ?A: seated LAQ -24*  P: supine ?-3* P:-3 ?(Supine) ?A: -9 (seated LAQ)   ?Ankle dorsiflexion       ?Ankle plantarflexion       ?Ankle inversion       ?Ankle eversion       ?(Blank rows = not tested) ? ?LE MMT: ? ?MMT Right ?05/27/2021 Left ?05/27/2021 Left ?06/05/21  ?Hip flexion     ?Hip extension     ?Hip abduction     ?Hip adduction     ?Hip internal rotation     ?Hip external rotation     ?Knee flexion  3-/5 4  ?Knee extension  3-/5 4  ?Ankle dorsiflexion     ?Ankle plantarflexion     ?Ankle inversion     ?Ankle eversion     ? (Blank rows = not tested) ? ?GAIT:05/27/21 ?Distance walked: 120' ?Assistive device utilized: Single point cane ?Level of assistance: SBA ?Comments: antalgic, decreased stance LLE, left knee flexed in stance and minimal flexion for swing,  hip hiking & circumduction to clear LLE. Step-to pattern.  ? ? ?TODAY'S TREATMENT: ?06/25/2021 ?Therex: ?     Aerobic: ?Recumbent bike seat 6 rocking back/forth for knee flexion stretch x 8 min ?     Standing: ?AA knee flexion on 8" step 10 x 10 sec hold on Lt ?Forward step ups 6" step x 10 reps with 1 UE support ?Gastroc stretch step heel depression 30 sec 2 reps ?Bil heel raises light UE support 10 reps. ?Step downs 4" step BUE 10 reps with PT demo & verbal cues on technique ?      Sitting: ?Heel slides on pillow case with leg press / quad set 5 sec for ext and knee flexion with trunk lean  20 reps.  ?Lt LAQ 4# 2 x 15 reps ?Hamstring stretch strap DF & trunk forward lean 30 sec 2 reps ? ?Neuromuscular Re-ed:  tandem stance on foam beam 30 sec LLE in front & 30 sec LLE in back. ? Fitter circular rocker board with BUE support ant/post 10 reps & right/left 10 reps. ?Self-Care ? Reviewed recommendation for elevation with quad sets & alphabet for muscle activity 2+ times per day. Pt verbalized understanding.  ? PT demo & verbal cues on scar mobs. Pt verbalized understanding.  ? ?Manual: ? PROM:  Seated Lt knee flexion with active Rt knee flexion to tolerance ? ?Vaso:  left knee medium compression 34* for 10 min ? ? ?06/23/21 ?Therex: ?     Aerobic: ?NuStep seat 9, L7 x 8 min ?     Standing: ?AA knee  flexion on 8" step 10 x 10 sec hold on Lt ?Forward step ups 6" step x 10 reps with 1 UE support ?RLE heel taps with LLE on step x 10 reps; heavy reliance on bil UEs ?      Sitting: ?Lt LAQ 4# 2x10 ? ?Manual: ? PROM: Seated Lt knee flexion with active Rt knee flexion to tolerance ?Self-Care ? Discussed edema strategies, recommend thigh high compression stockings to help with edema and see if that will help increased flexion ROM ? ?06/18/21 ?Therex: ?     Aerobic: ?NuStep seat 9, L5 x 8 min ?     Supine: ?AA heel slides with red physioball and strap 20 reps x 10 sec hold each ?     Sitting: ?AA Lt knee flexion 10 x 10 sec hold, RLE providing assist ? ? ?PATIENT EDUCATION:  ?Education details: Access Code: [email or web address] ?Person educated: Patient and Spouse ?Education method: Explanation, Demonstration, Tactile cues, Verbal cues, and Handouts ?Education comprehension: verbalized understanding, returned demonstration, verbal cues required, tactile cues required, and needs further education ? ?HOME EXERCISE PROGRAM: ?Access Code: [email or web address] ?URL: [email or web address] ?Date: 05/27/2021 ?Prepared by: Jamey Reas ? ?Exercises ?Quad Setting and Stretching - 2-4 x daily - 7 x weekly - 5-10 sets - 10 reps - prop 5-10 minutes & quad set5 seconds hold ?Ankle Alphabet in Elevation - 2-4 x daily - 7 x weekly - 1 sets - 1 reps ?Supine Heel Slide with Strap - 2-3 x daily - 7 x weekly - 2-3 sets - 10 reps - 5 seconds hold ?Supine Straight Leg Raises - 2-3 x daily - 7 x weekly - 2-3 sets - 10 reps - 5 seconds hold ?Seated Knee Flexion Extension AROM - 2-4 x daily - 7 x weekly - 2-3 sets - 10 reps - 5 seconds hold ?Seated Hamstring Stretch with Strap - 2-4 x daily - 7 x weekly - 1 sets - 3 reps - 20-30 seconds hold ?Standing  Gastroc Stretch - 2-4 x daily - 7 x weekly - 1 sets - 3 reps - 20-30 seconds hold ? ? ?ASSESSMENT: ?CLINICAL IMPRESSION: ?Patient tolerated progression of exercises. He was challenged by balance activities.  H

## 2021-06-27 DIAGNOSIS — G4733 Obstructive sleep apnea (adult) (pediatric): Secondary | ICD-10-CM | POA: Diagnosis not present

## 2021-06-30 ENCOUNTER — Encounter: Payer: Self-pay | Admitting: Physical Therapy

## 2021-06-30 ENCOUNTER — Ambulatory Visit: Payer: PPO | Admitting: Physical Therapy

## 2021-06-30 DIAGNOSIS — R600 Localized edema: Secondary | ICD-10-CM

## 2021-06-30 DIAGNOSIS — R2681 Unsteadiness on feet: Secondary | ICD-10-CM | POA: Diagnosis not present

## 2021-06-30 DIAGNOSIS — M6281 Muscle weakness (generalized): Secondary | ICD-10-CM

## 2021-06-30 DIAGNOSIS — M25562 Pain in left knee: Secondary | ICD-10-CM

## 2021-06-30 DIAGNOSIS — M25662 Stiffness of left knee, not elsewhere classified: Secondary | ICD-10-CM

## 2021-06-30 DIAGNOSIS — R2689 Other abnormalities of gait and mobility: Secondary | ICD-10-CM

## 2021-06-30 NOTE — Therapy (Signed)
?OUTPATIENT PHYSICAL THERAPY Treatment ? ? ? ? ? ?Patient Name: Ricky Barton ?MRN: [id number] ?DOB:17-Aug-1955, 66 y.o., male ?Today's Date: 06/30/2021 ? ? PT End of Session - 06/30/21 1143   ? ? Visit Number 13   ? Number of Visits 18   ? Date for PT Re-Evaluation 07/18/21   ? Authorization Type [employer]   ? Authorization Time Period $30 co-pay   ? Progress Note Due on Visit 20   ? PT Start Time 1141   ? PT Stop Time 1230   ? PT Time Calculation (min) 49 min   ? Activity Tolerance Patient tolerated treatment well   ? Behavior During Therapy [facility] for tasks assessed/performed   ? ?  ?  ? ?  ? ? ? ? ? ? ? ? ? ? ? ?Past Medical History:  ?Diagnosis Date  ? Arthritis   ? Hypoglycemic syndrome   ? 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 OSA on CPAP 09/12/2015  ? Severe by HST  ? Seizures ([place]) 1990s  ? isolated, none since, was on dilantin for 3 yrs, then stopped  ? ?Past Surgical History:  ?Procedure Laterality Date  ? CHOLECYSTECTOMY  1989  ? COLONOSCOPY  03/2011  ? 3 adenomatous polyps, rec rpt 5 yrs (Dr Fuller Plan).  ? COLONOSCOPY  07/2015  ? WNL, rpt 5 yrs Fuller Plan)  ? Head MRI  06/02  ? POLYPECTOMY    ? TOTAL KNEE ARTHROPLASTY Left 05/12/2021  ? Procedure: LEFT TOTAL KNEE ARTHROPLASTY;  Surgeon: Leandrew Koyanagi, MD;  Location: [place];  Service: Orthopedics;  Laterality: Left;  ? ?Patient Active Problem List  ? Diagnosis Date Noted  ? Primary osteoarthritis of left knee 05/12/2021  ? Status post total left knee replacement 05/12/2021  ? Allergic rhinitis 12/09/2020  ? Low serum vitamin B12 12/07/2020  ? Welcome to Medicare preventive visit 12/06/2020  ? Advanced directives, counseling/discussion 12/06/2020  ? Angular cheilitis 12/06/2020  ? 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 Elevated blood pressure reading without diagnosis of hypertension 11/10/2018  ? Renal lesion 03/28/2018  ? Arthralgia of hands, bilateral 09/12/2015  ? OSA on CPAP 09/12/2015  ? Obesity, Class I, BMI 30-34.9  04/05/2014  ? Hyperglycemia 03/30/2013  ? Healthcare maintenance 03/04/2012  ? HLD (hyperlipidemia) 01/18/2009  ? 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 History of seizure 08/15/1991  ? ? ?PCP: Ria Bush, MD ? ?REFERRING PROVIDER: Aundra Dubin, PA-C ? ?REFERRING DIAG: H20.947 (ICD-10-CM) - Hx of total knee replacement, left  ? ?THERAPY DIAG:  ?Acute pain of left knee ? ?Stiffness of left knee, not elsewhere classified ? ?Muscle weakness (generalized) ? ?Localized edema ? ?Unsteadiness on feet ? ?Other abnormalities of gait and mobility ? ?ONSET DATE: 05/12/2021 ? ?PERTINENT HISTORY: ?arthritis, hypoglemic syndrome, left ear hearing loss, seizures  ? ?SUBJECTIVE:  ?SUBJECTIVE STATEMENT: ?Wearing compression stockings; thinks swelling is better   ? ?PAIN:  ?Are you having pain? Yes: NPRS scale: upon arrival 2/10  and over last week lowest 2/10 and highest 7-8/10 ?Pain location: knee left medial & lateral down to ankle ?Pain description: soreness and stifffness ?Aggravating factors: tightness c end range movements, general soreness, worst middle of night. ?Relieving factors: meds, ice ? ?PRECAUTIONS: None ?WEIGHT BEARING RESTRICTIONS No ?PATIENT GOALS   play golf, yard work, Animator work, walk & care for dogs.  ? ?OBJECTIVE:  ? ?PATIENT SURVEYS:  ?05/27/2021 eval: FOTO 49% target 66% ?06/18/21 FOTO 47 ? ?EDEMA: ?  05/27/2021 eval: Right knee above knee 44.8cm  around knee  42.8cm below knee  38.5cm ?Left knee   above knee 51.0 cm around knee 48.6cm  below knee  44.5 cm ?4/10: Lt knee around knee 47.7 cm ?4/17: Lt knee joint line: 46 cm ? ?LE ROM: ? ?Passive(P) & Active(A) ROM Left ?05/27/21 Left ?06/03/21 Left ?06/09/21 Left ?06/18/21 Left ?06/23/21 Left ?06/30/21  ?Knee flexion P: supine ?84* ?A: seated 79* Supine AROM heel slide: 88 ? Seated ?P:98* Supine: ?P: 95 ?A: 85 (92 after exercises) Supine: ?A 93 Supine: ?A 95 ?P 98  ?Knee extension P: supine ?-7* ?A: seated LAQ -24*  P: supine ?-3* P:-3 ?(Supine) ?A: -9  (seated LAQ)  P: 0 ?A -4 (seated LAQ)  ?(Blank rows = not tested) ? ?LE MMT: ? ?MMT Right ?05/27/2021 Left ?05/27/2021 Left ?06/05/21  ?Knee flexion  3-/5 4  ?Knee extension  3-/5 4  ? (Blank rows = not tested) ? ?GAIT:05/27/21 ?Distance walked: 120' ?Assistive device utilized: Single point cane ?Level of assistance: SBA ?Comments: antalgic, decreased stance LLE, left knee flexed in stance and minimal flexion for swing,  hip hiking & circumduction to clear LLE. Step-to pattern.  ? ? ?TODAY'S TREATMENT: ?06/30/21 ?Therex: ?     Aerobic: ?Recumbent bike seat 6 rocking back/forth for knee flexion stretch x 8 min ?     Supine ?AA heel slide x 20 reps ?LAQ 2x15 on Lt with 4#, 5 sec hold ?      Sitting: ?AA Lt knee flexion 10 x 10 sec hold; RLE providing assist ?Manual: ?Lt knee PROM flexion and extension ?Modalities: ?Vaso x 10 min, Lt knee, medium pressure ? ? ?06/25/2021 ?Therex: ?     Aerobic: ?Recumbent bike seat 6 rocking back/forth for knee flexion stretch x 8 min ?     Standing: ?AA knee flexion on 8" step 10 x 10 sec hold on Lt ?Forward step ups 6" step x 10 reps with 1 UE support ?Gastroc stretch step heel depression 30 sec 2 reps ?Bil heel raises light UE support 10 reps. ?Step downs 4" step BUE 10 reps with PT demo & verbal cues on technique ?      Sitting: ?Heel slides on pillow case with leg press / quad set 5 sec for ext and knee flexion with trunk lean  20 reps.  ?Lt LAQ 4# 2 x 15 reps ?Hamstring stretch strap DF & trunk forward lean 30 sec 2 reps ? ?Neuromuscular Re-ed:  tandem stance on foam beam 30 sec LLE in front & 30 sec LLE in back. ? Fitter circular rocker board with BUE support ant/post 10 reps & right/left 10 reps. ?Self-Care ? Reviewed recommendation for elevation with quad sets & alphabet for muscle activity 2+ times per day. Pt verbalized understanding.  ? PT demo & verbal cues on scar mobs. Pt verbalized understanding.  ? ?Manual: ? PROM: Seated Lt knee flexion with active Rt knee flexion to  tolerance ? ?Vaso:  left knee medium compression 34* for 10 min ? ? ?06/23/21 ?Therex: ?     Aerobic: ?NuStep seat 9, L7 x 8 min ?     Standing: ?AA knee flexion on 8" step 10 x 10 sec hold on Lt ?Forward step ups 6" step x 10 reps with 1 UE support ?RLE heel taps with LLE on step x 10 reps; heavy reliance on bil UEs ?      Sitting: ?Lt LAQ 4# 2x10 ? ?Manual: ? PROM: Seated Lt  knee flexion with active Rt knee flexion to tolerance ?Self-Care ? Discussed edema strategies, recommend thigh high compression stockings to help with edema and see if that will help increased flexion ROM ? ?06/18/21 ?Therex: ?     Aerobic: ?NuStep seat 9, L5 x 8 min ?     Supine: ?AA heel slides with red physioball and strap 20 reps x 10 sec hold each ?     Sitting: ?AA Lt knee flexion 10 x 10 sec hold, RLE providing assist ? ? ?PATIENT EDUCATION:  ?Education details: Access Code: [email or web address] ?Person educated: Patient and Spouse ?Education method: Explanation, Demonstration, Tactile cues, Verbal cues, and Handouts ?Education comprehension: verbalized understanding, returned demonstration, verbal cues required, tactile cues required, and needs further education ? ?HOME EXERCISE PROGRAM: ?Access Code: [email or web address] ?URL: [email or web address] ?Date: 05/27/2021 ?Prepared by: Jamey Reas ? ?Exercises ?Quad Setting and Stretching - 2-4 x daily - 7 x weekly - 5-10 sets - 10 reps - prop 5-10 minutes & quad set5 seconds hold ?Ankle Alphabet in Elevation - 2-4 x daily - 7 x weekly - 1 sets - 1 reps ?Supine Heel Slide with Strap - 2-3 x daily - 7 x weekly - 2-3 sets - 10 reps - 5 seconds hold ?Supine Straight Leg Raises - 2-3 x daily - 7 x weekly - 2-3 sets - 10 reps - 5 seconds hold ?Seated Knee Flexion Extension AROM - 2-4 x daily - 7 x weekly - 2-3 sets - 10 reps - 5 seconds hold ?Seated Hamstring Stretch with Strap - 2-4 x daily - 7 x weekly - 1 sets - 3 reps - 20-30 seconds hold ?Standing Gastroc Stretch - 2-4 x daily - 7 x weekly - 1 sets -  3 reps - 20-30 seconds hold ? ? ?ASSESSMENT: ?CLINICAL IMPRESSION: ?Pt tolerated session well today with reduction in edema noted as well as improved ROM.  Steady progress with PT, and will continue to benefit

## 2021-07-01 ENCOUNTER — Ambulatory Visit (INDEPENDENT_AMBULATORY_CARE_PROVIDER_SITE_OTHER): Payer: PPO | Admitting: Orthopaedic Surgery

## 2021-07-01 ENCOUNTER — Encounter: Payer: Self-pay | Admitting: Orthopaedic Surgery

## 2021-07-01 ENCOUNTER — Ambulatory Visit (INDEPENDENT_AMBULATORY_CARE_PROVIDER_SITE_OTHER): Payer: PPO

## 2021-07-01 DIAGNOSIS — Z96652 Presence of left artificial knee joint: Secondary | ICD-10-CM

## 2021-07-01 MED ORDER — TRAMADOL HCL 50 MG PO TABS
50.0000 mg | ORAL_TABLET | Freq: Three times a day (TID) | ORAL | 0 refills | Status: DC | PRN
Start: 2021-07-01 — End: 2021-10-23

## 2021-07-01 NOTE — Progress Notes (Signed)
? ?Post-Op Visit Note ?  ?Patient: Ricky Barton           ?Date of Birth: 11-02-1955           ?MRN: [id number] ?Visit Date: 07/01/2021 ?PCP: Ria Bush, MD ? ? ?Assessment & Plan: ? ?Chief Complaint:  ?Chief Complaint  ?Patient presents with  ? Left Knee - Routine Post Op  ? ?Visit Diagnoses:  ?1. S/P total knee replacement, left   ? ? ?Plan: Patient is a pleasant 66 year old gentleman who comes in today approximately 7 weeks status post left total knee replacement 05/12/2018.  He has been doing relatively well.  He has been in physical therapy making slow but steady progress.  Examination of his left knee reveals a fully healed surgical scar without complication.  Range of motion 5 to 98 degrees.  He is stable vascular stress.  Is neurovascular intact distally.  At this point, we have discussed really pushing things with physical therapy to increase his range of motion he is only taking Tylenol so have called in tramadol to hopefully help with this.  He will follow-up with [place] in 3 weeks time when he is 10 weeks out from surgery for repeat evaluation or we will further discuss manipulation if needed.  Dental prophylaxis reinforced.  Call with concerns or questions. ? ?Follow-Up Instructions: Return in about 5 weeks (around 08/05/2021).  ? ?Orders:  ?Orders Placed This Encounter  ?Procedures  ? XR Knee 1-2 Views Left  ? ?No orders of the defined types were placed in this encounter. ? ? ?Imaging: ?XR Knee 1-2 Views Left ? ?Result Date: 07/01/2021 ?Well-seated prosthesis without complication  ? ?PMFS History: ?Patient Active Problem List  ? Diagnosis Date Noted  ? Primary osteoarthritis of left knee 05/12/2021  ? Status post total left knee replacement 05/12/2021  ? Allergic rhinitis 12/09/2020  ? Low serum vitamin B12 12/07/2020  ? Welcome to Medicare preventive visit 12/06/2020  ? Advanced directives, counseling/discussion 12/06/2020  ? Angular cheilitis 12/06/2020  ? 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  Elevated blood pressure reading without diagnosis of hypertension 11/10/2018  ? Renal lesion 03/28/2018  ? Arthralgia of hands, bilateral 09/12/2015  ? OSA on CPAP 09/12/2015  ? Obesity, Class I, BMI 30-34.9 04/05/2014  ? Hyperglycemia 03/30/2013  ? Healthcare maintenance 03/04/2012  ? HLD (hyperlipidemia) 01/18/2009  ? 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 History of seizure 08/15/1991  ? ?Past Medical History:  ?Diagnosis Date  ? Arthritis   ? Hypoglycemic syndrome   ? 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 OSA on CPAP 09/12/2015  ? Severe by HST  ? Seizures ([place]) 1990s  ? isolated, none since, was on dilantin for 3 yrs, then stopped  ?  ?Family History  ?Problem Relation Age of Onset  ? Hypertension Mother   ? Colon polyps Mother   ? Cancer Father 31  ?     stomach cancer  ? Hyperlipidemia Brother   ? CAD Brother 26  ?     MI (smoker)  ? Hyperlipidemia Brother   ? Sleep apnea Brother   ? Pancreatic cancer Brother 46  ? Hyperlipidemia Brother   ? CAD Brother 68  ?     MI (smoker)  ? Hyperlipidemia Brother   ? Multiple sclerosis Brother   ? Colon cancer Neg Hx   ? Esophageal cancer Neg Hx   ? Rectal cancer Neg Hx   ?  ?Past Surgical  History:  ?Procedure Laterality Date  ? CHOLECYSTECTOMY  1989  ? COLONOSCOPY  03/2011  ? 3 adenomatous polyps, rec rpt 5 yrs (Dr Fuller Plan).  ? COLONOSCOPY  07/2015  ? WNL, rpt 5 yrs Fuller Plan)  ? Head MRI  06/02  ? POLYPECTOMY    ? TOTAL KNEE ARTHROPLASTY Left 05/12/2021  ? Procedure: LEFT TOTAL KNEE ARTHROPLASTY;  Surgeon: Leandrew Koyanagi, MD;  Location: [place];  Service: Orthopedics;  Laterality: Left;  ? ?Social History  ? ?Occupational History  ? Occupation: Art therapist  ?  Employer: [employer]  ?  Comment: [employer]  ?Tobacco Use  ? Smoking status: Never  ? Smokeless tobacco: Never  ?Vaping Use  ? Vaping Use: Never used  ?Substance and Sexual Activity  ? Alcohol use: No  ? Drug use: No  ? Sexual activity: Yes  ? ? ? ?

## 2021-07-03 ENCOUNTER — Ambulatory Visit: Payer: PPO | Admitting: Physical Therapy

## 2021-07-03 ENCOUNTER — Encounter: Payer: Self-pay | Admitting: Physical Therapy

## 2021-07-03 DIAGNOSIS — M25562 Pain in left knee: Secondary | ICD-10-CM

## 2021-07-03 DIAGNOSIS — R600 Localized edema: Secondary | ICD-10-CM | POA: Diagnosis not present

## 2021-07-03 DIAGNOSIS — M25662 Stiffness of left knee, not elsewhere classified: Secondary | ICD-10-CM | POA: Diagnosis not present

## 2021-07-03 DIAGNOSIS — M6281 Muscle weakness (generalized): Secondary | ICD-10-CM | POA: Diagnosis not present

## 2021-07-03 DIAGNOSIS — R2689 Other abnormalities of gait and mobility: Secondary | ICD-10-CM | POA: Diagnosis not present

## 2021-07-03 DIAGNOSIS — R2681 Unsteadiness on feet: Secondary | ICD-10-CM

## 2021-07-03 NOTE — Therapy (Signed)
?OUTPATIENT PHYSICAL THERAPY Treatment ? ? ? ? ? ?Patient Name: Ricky Barton ?MRN: [id number] ?DOB:1955/08/15, 66 y.o., male ?Today's Date: 07/03/2021 ? ? PT End of Session - 07/03/21 1403   ? ? Visit Number 14   ? Number of Visits 18   ? Date for PT Re-Evaluation 07/18/21   ? Authorization Type [employer]   ? Authorization Time Period $30 co-pay   ? Progress Note Due on Visit 20   ? PT Start Time 1340   ? PT Stop Time 0867   ? PT Time Calculation (min) 49 min   ? Activity Tolerance Patient tolerated treatment well   ? Behavior During Therapy [facility] for tasks assessed/performed   ? ?  ?  ? ?  ? ? ? ? ? ? ? ? ? ? ? ? ?Past Medical History:  ?Diagnosis Date  ? Arthritis   ? Hypoglycemic syndrome   ? 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 OSA on CPAP 09/12/2015  ? Severe by HST  ? Seizures ([place]) 1990s  ? isolated, none since, was on dilantin for 3 yrs, then stopped  ? ?Past Surgical History:  ?Procedure Laterality Date  ? CHOLECYSTECTOMY  1989  ? COLONOSCOPY  03/2011  ? 3 adenomatous polyps, rec rpt 5 yrs (Dr Fuller Plan).  ? COLONOSCOPY  07/2015  ? WNL, rpt 5 yrs Fuller Plan)  ? Head MRI  06/02  ? POLYPECTOMY    ? TOTAL KNEE ARTHROPLASTY Left 05/12/2021  ? Procedure: LEFT TOTAL KNEE ARTHROPLASTY;  Surgeon: Leandrew Koyanagi, MD;  Location: [facility];  Service: Orthopedics;  Laterality: Left;  ? ?Patient Active Problem List  ? Diagnosis Date Noted  ? Primary osteoarthritis of left knee 05/12/2021  ? Status post total left knee replacement 05/12/2021  ? Allergic rhinitis 12/09/2020  ? Low serum vitamin B12 12/07/2020  ? Welcome to Medicare preventive visit 12/06/2020  ? Advanced directives, counseling/discussion 12/06/2020  ? Angular cheilitis 12/06/2020  ? 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 Elevated blood pressure reading without diagnosis of hypertension 11/10/2018  ? Renal lesion 03/28/2018  ? Arthralgia of hands, bilateral 09/12/2015  ? OSA on CPAP 09/12/2015  ? Obesity, Class I, BMI 30-34.9  04/05/2014  ? Hyperglycemia 03/30/2013  ? Healthcare maintenance 03/04/2012  ? HLD (hyperlipidemia) 01/18/2009  ? 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 History of seizure 08/15/1991  ? ? ?PCP: Ria Bush, MD ? ?REFERRING PROVIDER: Aundra Dubin, PA-C ? ?REFERRING DIAG: Y19.509 (ICD-10-CM) - Hx of total knee replacement, left  ? ?THERAPY DIAG:  ?Acute pain of left knee ? ?Stiffness of left knee, not elsewhere classified ? ?Muscle weakness (generalized) ? ?Localized edema ? ?Unsteadiness on feet ? ?Other abnormalities of gait and mobility ? ?ONSET DATE: 05/12/2021 ? ?PERTINENT HISTORY: ?arthritis, hypoglemic syndrome, left ear hearing loss, seizures  ? ?SUBJECTIVE:  ?SUBJECTIVE STATEMENT: ?Knee is more tight today, thinks he may have pushed a little too hard yesterday ? ?PAIN:  ?Are you having pain? Yes: NPRS scale: upon arrival 2/10  and over last week lowest 2/10 and highest 7-8/10 ?Pain location: knee left medial & lateral down to ankle ?Pain description: soreness and stifffness ?Aggravating factors: tightness c end range movements, general soreness, worst middle of night. ?Relieving factors: meds, ice ? ?PRECAUTIONS: None ?WEIGHT BEARING RESTRICTIONS No ?PATIENT GOALS   play golf, yard work, Animator work, walk & care for dogs.  ? ?OBJECTIVE:  ? ?PATIENT SURVEYS:  ?05/27/2021 eval: FOTO 49%  target 66% ?06/18/21 FOTO 47 ? ?EDEMA: ?05/27/2021 eval: Right knee above knee 44.8cm  around knee  42.8cm below knee  38.5cm ?Left knee   above knee 51.0 cm around knee 48.6cm  below knee  44.5 cm ?4/10: Lt knee around knee 47.7 cm ?4/17: Lt knee joint line: 46 cm ? ?LE ROM: ? ?Passive(P) & Active(A) ROM Left ?05/27/21 Left ?06/03/21 Left ?06/09/21 Left ?06/18/21 Left ?06/23/21 Left ?06/30/21 Left ?07/03/21  ?Knee flexion P: supine ?84* ?A: seated 79* Supine AROM heel slide: 88 ? Seated ?P:98* Supine: ?P: 95 ?A: 85 (92 after exercises) Supine: ?A 93 Supine: ?A 95 ?P 98 Supine:  ?A 98  ?Knee extension P: supine ?-7* ?A:  seated LAQ -24*  P: supine ?-3* P:-3 ?(Supine) ?A: -9 (seated LAQ)  P: 0 ?A -4 (seated LAQ)   ?(Blank rows = not tested) ? ?LE MMT: ? ?MMT Right ?05/27/2021 Left ?05/27/2021 Left ?06/05/21  ?Knee flexion  3-/5 4  ?Knee extension  3-/5 4  ? (Blank rows = not tested) ? ?GAIT:05/27/21 ?Distance walked: 120' ?Assistive device utilized: Single point cane ?Level of assistance: SBA ?Comments: antalgic, decreased stance LLE, left knee flexed in stance and minimal flexion for swing,  hip hiking & circumduction to clear LLE. Step-to pattern.  ? ? ?TODAY'S TREATMENT: ?07/03/21 ?Therex: ?     Aerobic: ?Recumbent bike seat 7 x 8 min; partial revolutions ?    Machines: ?LLE only leg press 3x10; 50#; 1 min flexion holds between sets ?    Sitting: ?AA Lt knee flexion 10 x 10 sec hold; RLE providing assist ? ?Manual: ?Lt knee flexion in sitting with contract/relax ?Modalities: ?Vaso x 10 min, Lt knee, mod pressure ? ? ?06/30/21 ?Therex: ?     Aerobic: ?Recumbent bike seat 6 rocking back/forth for knee flexion stretch x 8 min ?     Supine ?AA heel slide x 20 reps ?LAQ 2x15 on Lt with 4#, 5 sec hold ?      Sitting: ?AA Lt knee flexion 10 x 10 sec hold; RLE providing assist ?Manual: ?Lt knee PROM flexion and extension ?Modalities: ?Vaso x 10 min, Lt knee, medium pressure ? ? ?06/25/2021 ?Therex: ?     Aerobic: ?Recumbent bike seat 6 rocking back/forth for knee flexion stretch x 8 min ?     Standing: ?AA knee flexion on 8" step 10 x 10 sec hold on Lt ?Forward step ups 6" step x 10 reps with 1 UE support ?Gastroc stretch step heel depression 30 sec 2 reps ?Bil heel raises light UE support 10 reps. ?Step downs 4" step BUE 10 reps with PT demo & verbal cues on technique ?      Sitting: ?Heel slides on pillow case with leg press / quad set 5 sec for ext and knee flexion with trunk lean  20 reps.  ?Lt LAQ 4# 2 x 15 reps ?Hamstring stretch strap DF & trunk forward lean 30 sec 2 reps ? ?Neuromuscular Re-ed:  tandem stance on foam beam 30 sec LLE in  front & 30 sec LLE in back. ? Fitter circular rocker board with BUE support ant/post 10 reps & right/left 10 reps. ?Self-Care ? Reviewed recommendation for elevation with quad sets & alphabet for muscle activity 2+ times per day. Pt verbalized understanding.  ? PT demo & verbal cues on scar mobs. Pt verbalized understanding.  ? ?Manual: ? PROM: Seated Lt knee flexion with active Rt knee flexion to tolerance ? ?Vaso:  left knee medium  compression 34* for 10 min ? ? ?PATIENT EDUCATION:  ?Education details: Access Code: [email or web address] ?Person educated: Patient and Spouse ?Education method: Explanation, Demonstration, Tactile cues, Verbal cues, and Handouts ?Education comprehension: verbalized understanding, returned demonstration, verbal cues required, tactile cues required, and needs further education ? ?HOME EXERCISE PROGRAM: ?Access Code: [email or web address] ?URL: [email or web address] ?Date: 05/27/2021 ?Prepared by: Jamey Reas ? ?Exercises ?Quad Setting and Stretching - 2-4 x daily - 7 x weekly - 5-10 sets - 10 reps - prop 5-10 minutes & quad set5 seconds hold ?Ankle Alphabet in Elevation - 2-4 x daily - 7 x weekly - 1 sets - 1 reps ?Supine Heel Slide with Strap - 2-3 x daily - 7 x weekly - 2-3 sets - 10 reps - 5 seconds hold ?Supine Straight Leg Raises - 2-3 x daily - 7 x weekly - 2-3 sets - 10 reps - 5 seconds hold ?Seated Knee Flexion Extension AROM - 2-4 x daily - 7 x weekly - 2-3 sets - 10 reps - 5 seconds hold ?Seated Hamstring Stretch with Strap - 2-4 x daily - 7 x weekly - 1 sets - 3 reps - 20-30 seconds hold ?Standing Gastroc Stretch - 2-4 x daily - 7 x weekly - 1 sets - 3 reps - 20-30 seconds hold ? ? ?ASSESSMENT: ?CLINICAL IMPRESSION: ?Pt tolerated session well with slight improvement in active flexion noted today.  Will continue to benefit from PT to maximize function.  Swelling continues to impact ROM at this time. ? ?OBJECTIVE IMPAIRMENTS Abnormal gait, decreased activity tolerance, decreased balance,  decreased endurance, decreased knowledge of use of DME, decreased mobility, decreased ROM, decreased strength, increased edema, impaired flexibility, postural dysfunction, and pain.  ? ?ACTIVITY LIMITATION

## 2021-07-07 ENCOUNTER — Encounter: Payer: Self-pay | Admitting: Physical Therapy

## 2021-07-07 ENCOUNTER — Ambulatory Visit: Payer: PPO | Admitting: Physical Therapy

## 2021-07-07 DIAGNOSIS — M6281 Muscle weakness (generalized): Secondary | ICD-10-CM

## 2021-07-07 DIAGNOSIS — M25562 Pain in left knee: Secondary | ICD-10-CM | POA: Diagnosis not present

## 2021-07-07 DIAGNOSIS — R600 Localized edema: Secondary | ICD-10-CM

## 2021-07-07 DIAGNOSIS — M25662 Stiffness of left knee, not elsewhere classified: Secondary | ICD-10-CM

## 2021-07-07 DIAGNOSIS — R2689 Other abnormalities of gait and mobility: Secondary | ICD-10-CM

## 2021-07-07 DIAGNOSIS — R2681 Unsteadiness on feet: Secondary | ICD-10-CM

## 2021-07-07 NOTE — Therapy (Signed)
?OUTPATIENT PHYSICAL THERAPY Treatment ? ? ? ? ? ?Patient Name: Ricky Barton ?MRN: [id number] ?DOB:1956/01/25, 66 y.o., male ?Today's Date: 07/07/2021 ? ? PT End of Session - 07/07/21 1220   ? ? Visit Number 15   ? Number of Visits 18   ? Date for PT Re-Evaluation 07/18/21   ? Authorization Type [employer]   ? Authorization Time Period $30 co-pay   ? Progress Note Due on Visit 20   ? PT Start Time 1145   ? PT Stop Time 1225   ? PT Time Calculation (min) 40 min   ? Activity Tolerance Patient tolerated treatment well   ? Behavior During Therapy [facility] for tasks assessed/performed   ? ?  ?  ? ?  ? ? ? ? ? ? ? ? ? ? ? ? ? ?Past Medical History:  ?Diagnosis Date  ? Arthritis   ? Hypoglycemic syndrome   ? 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 OSA on CPAP 09/12/2015  ? Severe by HST  ? Seizures ([place]) 1990s  ? isolated, none since, was on dilantin for 3 yrs, then stopped  ? ?Past Surgical History:  ?Procedure Laterality Date  ? CHOLECYSTECTOMY  1989  ? COLONOSCOPY  03/2011  ? 3 adenomatous polyps, rec rpt 5 yrs (Dr Fuller Plan).  ? COLONOSCOPY  07/2015  ? WNL, rpt 5 yrs Fuller Plan)  ? Head MRI  06/02  ? POLYPECTOMY    ? TOTAL KNEE ARTHROPLASTY Left 05/12/2021  ? Procedure: LEFT TOTAL KNEE ARTHROPLASTY;  Surgeon: Leandrew Koyanagi, MD;  Location: [facility];  Service: Orthopedics;  Laterality: Left;  ? ?Patient Active Problem List  ? Diagnosis Date Noted  ? Primary osteoarthritis of left knee 05/12/2021  ? Status post total left knee replacement 05/12/2021  ? Allergic rhinitis 12/09/2020  ? Low serum vitamin B12 12/07/2020  ? Welcome to Medicare preventive visit 12/06/2020  ? Advanced directives, counseling/discussion 12/06/2020  ? Angular cheilitis 12/06/2020  ? 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 Elevated blood pressure reading without diagnosis of hypertension 11/10/2018  ? Renal lesion 03/28/2018  ? Arthralgia of hands, bilateral 09/12/2015  ? OSA on CPAP 09/12/2015  ? Obesity, Class I, BMI 30-34.9  04/05/2014  ? Hyperglycemia 03/30/2013  ? Healthcare maintenance 03/04/2012  ? HLD (hyperlipidemia) 01/18/2009  ? 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 History of seizure 08/15/1991  ? ? ?PCP: Ria Bush, MD ? ?REFERRING PROVIDER: Aundra Dubin, PA-C ? ?REFERRING DIAG: C94.709 (ICD-10-CM) - Hx of total knee replacement, left  ? ?THERAPY DIAG:  ?Acute pain of left knee ? ?Stiffness of left knee, not elsewhere classified ? ?Muscle weakness (generalized) ? ?Localized edema ? ?Unsteadiness on feet ? ?Other abnormalities of gait and mobility ? ?ONSET DATE: 05/12/2021 ? ?PERTINENT HISTORY: ?arthritis, hypoglemic syndrome, left ear hearing loss, seizures  ? ?SUBJECTIVE:  ?SUBJECTIVE STATEMENT: ?Sleeping better lately, Rt knee has had some slight pain ? ?PAIN:  ?Are you having pain? Yes: NPRS scale: upon arrival 2/10  and over last week lowest 2/10 and highest 7-8/10 ?Pain location: knee left medial & lateral down to ankle ?Pain description: soreness and stifffness ?Aggravating factors: tightness c end range movements, general soreness, worst middle of night. ?Relieving factors: meds, ice ? ?PRECAUTIONS: None ?WEIGHT BEARING RESTRICTIONS No ?PATIENT GOALS   play golf, yard work, Animator work, walk & care for dogs.  ? ?OBJECTIVE:  ? ?PATIENT SURVEYS:  ?05/27/2021 eval: FOTO 49% target 66% ?06/18/21 FOTO  96 ? ?EDEMA: ?05/27/2021 eval: Right knee above knee 44.8cm  around knee  42.8cm below knee  38.5cm ?Left knee   above knee 51.0 cm around knee 48.6cm  below knee  44.5 cm ?4/10: Lt knee around knee 47.7 cm ?4/17: Lt knee joint line: 46 cm ? ?LE ROM: ? ?Passive(P) & Active(A) ROM Left ?05/27/21 Left ?06/03/21 Left ?06/09/21 Left ?06/18/21 Left ?06/23/21 Left ?06/30/21 Left ?07/03/21  ?Knee flexion P: supine ?84* ?A: seated 79* Supine AROM heel slide: 88 ? Seated ?P:98* Supine: ?P: 95 ?A: 85 (92 after exercises) Supine: ?A 93 Supine: ?A 95 ?P 98 Supine:  ?A 98  ?Knee extension P: supine ?-7* ?A: seated LAQ -24*  P:  supine ?-3* P:-3 ?(Supine) ?A: -9 (seated LAQ)  P: 0 ?A -4 (seated LAQ)   ?(Blank rows = not tested) ? ?LE MMT: ? ?MMT Right ?05/27/2021 Left ?05/27/2021 Left ?06/05/21  ?Knee flexion  3-/5 4  ?Knee extension  3-/5 4  ? (Blank rows = not tested) ? ?GAIT:05/27/21 ?Distance walked: 120' ?Assistive device utilized: Single point cane ?Level of assistance: SBA ?Comments: antalgic, decreased stance LLE, left knee flexed in stance and minimal flexion for swing,  hip hiking & circumduction to clear LLE. Step-to pattern.  ? ? ?TODAY'S TREATMENT: ?07/07/21 ?Therex: ?     Aerobic: ?Recumbent bike seat 7 x 8 min; partial revolutions ?    Machines: ?LLE only leg press 3x10; 50#; 1 min flexion holds between sets ?LLE knee extension 10# 3x10 (min A from RLE) ?LLE knee flexion 20# 3x10 (min A from RLE) ?FWD step ups onto 6" step 2x10 bil; intermittent UE support needed ?LLE on 4" step: Rt heel tap 2x10 ? ?07/03/21 ?Therex: ?     Aerobic: ?Recumbent bike seat 7 x 8 min; partial revolutions ?    Machines: ?LLE only leg press 3x10; 50#; 1 min flexion holds between sets ?    Sitting: ?AA Lt knee flexion 10 x 10 sec hold; RLE providing assist ? ?Manual: ?Lt knee flexion in sitting with contract/relax ?Modalities: ?Vaso x 10 min, Lt knee, mod pressure ? ? ?06/30/21 ?Therex: ?     Aerobic: ?Recumbent bike seat 6 rocking back/forth for knee flexion stretch x 8 min ?     Supine ?AA heel slide x 20 reps ?LAQ 2x15 on Lt with 4#, 5 sec hold ?      Sitting: ?AA Lt knee flexion 10 x 10 sec hold; RLE providing assist ?Manual: ?Lt knee PROM flexion and extension ?Modalities: ?Vaso x 10 min, Lt knee, medium pressure ? ? ? ?PATIENT EDUCATION:  ?Education details: Access Code: [email or web address] ?Person educated: Patient and Spouse ?Education method: Explanation, Demonstration, Tactile cues, Verbal cues, and Handouts ?Education comprehension: verbalized understanding, returned demonstration, verbal cues required, tactile cues required, and needs further  education ? ?HOME EXERCISE PROGRAM: ?Access Code: [email or web address] ?URL: [email or web address] ?Date: 05/27/2021 ?Prepared by: Jamey Reas ? ?Exercises ?Quad Setting and Stretching - 2-4 x daily - 7 x weekly - 5-10 sets - 10 reps - prop 5-10 minutes & quad set5 seconds hold ?Ankle Alphabet in Elevation - 2-4 x daily - 7 x weekly - 1 sets - 1 reps ?Supine Heel Slide with Strap - 2-3 x daily - 7 x weekly - 2-3 sets - 10 reps - 5 seconds hold ?Supine Straight Leg Raises - 2-3 x daily - 7 x weekly - 2-3 sets - 10 reps - 5 seconds hold ?Seated Knee Flexion Extension AROM - 2-4 x daily -  7 x weekly - 2-3 sets - 10 reps - 5 seconds hold ?Seated Hamstring Stretch with Strap - 2-4 x daily - 7 x weekly - 1 sets - 3 reps - 20-30 seconds hold ?Standing Gastroc Stretch - 2-4 x daily - 7 x weekly - 1 sets - 3 reps - 20-30 seconds hold ? ? ?ASSESSMENT: ?CLINICAL IMPRESSION: ?Pt tolerated session well today focusing on strengthening and balance activities.  Will continue to benefit from PT to maximize function. ? ? ?OBJECTIVE IMPAIRMENTS Abnormal gait, decreased activity tolerance, decreased balance, decreased endurance, decreased knowledge of use of DME, decreased mobility, decreased ROM, decreased strength, increased edema, impaired flexibility, postural dysfunction, and pain.  ? ?ACTIVITY LIMITATIONS community activity, driving, and yard work.  ? ?PERSONAL FACTORS 1 comorbidity: arthritis, hypoglemic syndrome, left ear hearing loss, seizure last one 72yr ago  are also affecting patient's functional outcome.  ? ?REHAB POTENTIAL: Good ? ?CLINICAL DECISION MAKING: Stable/uncomplicated ? ?EVALUATION COMPLEXITY: Low ? ? ?GOALS: ?Goals reviewed with patient? Yes ? ?SHORT TERM GOALS: Target date: 06/20/2021 ? ?Patient independent and verbalizes compliance with initial HEP ? ?Goal status: MET - assessed 06/03/2021 ? ?2.  Patient reports 50% improvement in left knee pain. ? ?Goal status: on going - assessed 06/16/21 (still  2-10/10) ? ?3.  PROM left knee extension -3* to flexion 90* ? ?Goal status: MET 06/09/2021 ? ?LONG TERM GOALS: Target date: 07/18/2021 ? ?Patient will improve FOTO score to 66% ? ?Goal status: ONGOING 06/18/21 ? ?2.  Patient reports l

## 2021-07-09 ENCOUNTER — Encounter: Payer: Self-pay | Admitting: Physical Therapy

## 2021-07-09 ENCOUNTER — Ambulatory Visit: Payer: PPO | Admitting: Physical Therapy

## 2021-07-09 DIAGNOSIS — M25562 Pain in left knee: Secondary | ICD-10-CM

## 2021-07-09 DIAGNOSIS — R2681 Unsteadiness on feet: Secondary | ICD-10-CM

## 2021-07-09 DIAGNOSIS — R2689 Other abnormalities of gait and mobility: Secondary | ICD-10-CM

## 2021-07-09 DIAGNOSIS — R600 Localized edema: Secondary | ICD-10-CM

## 2021-07-09 DIAGNOSIS — M25662 Stiffness of left knee, not elsewhere classified: Secondary | ICD-10-CM

## 2021-07-09 DIAGNOSIS — M6281 Muscle weakness (generalized): Secondary | ICD-10-CM | POA: Diagnosis not present

## 2021-07-09 NOTE — Therapy (Signed)
?OUTPATIENT PHYSICAL THERAPY Treatment ? ? ? ? ? ?Patient Name: Ricky Barton ?MRN: [id number] ?DOB:January 10, 1956, 66 y.o., male ?Today's Date: 07/09/2021 ? ? PT End of Session - 07/09/21 1245   ? ? Visit Number 16   ? Number of Visits 18   ? Date for PT Re-Evaluation 07/18/21   ? Authorization Type [employer]   ? Authorization Time Period $30 co-pay   ? Progress Note Due on Visit 20   ? PT Start Time 1144   ? PT Stop Time 1232   ? PT Time Calculation (min) 48 min   ? Activity Tolerance Patient tolerated treatment well   ? Behavior During Therapy [facility] for tasks assessed/performed   ? ?  ?  ? ?  ? ? ? ? ? ? ? ? ? ? ? ? ? ? ?Past Medical History:  ?Diagnosis Date  ? Arthritis   ? Hypoglycemic syndrome   ? 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 OSA on CPAP 09/12/2015  ? Severe by HST  ? Seizures ([place]) 1990s  ? isolated, none since, was on dilantin for 3 yrs, then stopped  ? ?Past Surgical History:  ?Procedure Laterality Date  ? CHOLECYSTECTOMY  1989  ? COLONOSCOPY  03/2011  ? 3 adenomatous polyps, rec rpt 5 yrs (Dr Fuller Plan).  ? COLONOSCOPY  07/2015  ? WNL, rpt 5 yrs Fuller Plan)  ? Head MRI  06/02  ? POLYPECTOMY    ? TOTAL KNEE ARTHROPLASTY Left 05/12/2021  ? Procedure: LEFT TOTAL KNEE ARTHROPLASTY;  Surgeon: Leandrew Koyanagi, MD;  Location: [place];  Service: Orthopedics;  Laterality: Left;  ? ?Patient Active Problem List  ? Diagnosis Date Noted  ? Primary osteoarthritis of left knee 05/12/2021  ? Status post total left knee replacement 05/12/2021  ? Allergic rhinitis 12/09/2020  ? Low serum vitamin B12 12/07/2020  ? Welcome to Medicare preventive visit 12/06/2020  ? Advanced directives, counseling/discussion 12/06/2020  ? Angular cheilitis 12/06/2020  ? 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 Elevated blood pressure reading without diagnosis of hypertension 11/10/2018  ? Renal lesion 03/28/2018  ? Arthralgia of hands, bilateral 09/12/2015  ? OSA on CPAP 09/12/2015  ? Obesity, Class I, BMI  30-34.9 04/05/2014  ? Hyperglycemia 03/30/2013  ? Healthcare maintenance 03/04/2012  ? HLD (hyperlipidemia) 01/18/2009  ? 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 History of seizure 08/15/1991  ? ? ?PCP: Ria Bush, MD ? ?REFERRING PROVIDER: Aundra Dubin, PA-C ? ?REFERRING DIAG: L89.211 (ICD-10-CM) - Hx of total knee replacement, left  ? ?THERAPY DIAG:  ?Stiffness of left knee, not elsewhere classified ? ?Muscle weakness (generalized) ? ?Localized edema ? ?Unsteadiness on feet ? ?Acute pain of left knee ? ?Other abnormalities of gait and mobility ? ?ONSET DATE: 05/12/2021 ? ?PERTINENT HISTORY: ?arthritis, hypoglemic syndrome, left ear hearing loss, seizures  ? ?SUBJECTIVE:  ?SUBJECTIVE STATEMENT: ?Sleeping better lately, Rt knee has had some slight pain ? ?PAIN:  ?Are you having pain? Yes: NPRS scale: upon arrival 2/10  and over last week lowest 2/10 and highest 7-8/10 ?Pain location: knee left medial & lateral down to ankle ?Pain description: soreness and stifffness ?Aggravating factors: tightness c end range movements, general soreness, worst middle of night. ?Relieving factors: meds, ice ? ?PRECAUTIONS: None ?WEIGHT BEARING RESTRICTIONS No ?PATIENT GOALS   play golf, yard work, Animator work, walk & care for dogs.  ? ?OBJECTIVE:  ? ?PATIENT SURVEYS:  ?05/27/2021 eval: FOTO 49% target 66% ?06/18/21  FOTO 47 ? ?EDEMA: ?05/27/2021 eval: Right knee above knee 44.8cm  around knee  42.8cm below knee  38.5cm ?Left knee   above knee 51.0 cm around knee 48.6cm  below knee  44.5 cm ?4/10: Lt knee around knee 47.7 cm ?4/17: Lt knee joint line: 46 cm ?07/09/21: Lt knee joint line 45.5 cm ? ?LE ROM: ? ?Passive(P) & Active(A) ROM Left ?05/27/21 Left ?06/03/21 Left ?06/09/21 Left ?06/18/21 Left ?06/23/21 Left ?06/30/21 Left ?07/03/21 Left ?07/09/21  ?Knee flexion P: supine ?84* ?A: seated 79* Supine AROM heel slide: 88 ? Seated ?P:98* Supine: ?P: 95 ?A: 85 (92 after exercises) Supine: ?A 93 Supine: ?A 95 ?P 98 Supine:  ?A 98  Supine: ?A 99 ?P 102  ?Knee extension P: supine ?-7* ?A: seated LAQ -24*  P: supine ?-3* P:-3 ?(Supine) ?A: -9 (seated LAQ)  P: 0 ?A -4 (seated LAQ)  A -2 ?(Seated LAQ)  ?(Blank rows = not tested) ? ?LE MMT: ? ?MMT Right ?05/27/2021 Left ?05/27/2021 Left ?06/05/21  ?Knee flexion  3-/5 4  ?Knee extension  3-/5 4  ? (Blank rows = not tested) ? ?GAIT:05/27/21 ?Distance walked: 120' ?Assistive device utilized: Single point cane ?Level of assistance: SBA ?Comments: antalgic, decreased stance LLE, left knee flexed in stance and minimal flexion for swing,  hip hiking & circumduction to clear LLE. Step-to pattern.  ? ? ?TODAY'S TREATMENT: ?07/09/21 ?Therex: ?     Aerobic: ?Recumbent bike x 8 min; seat 7; partial revolutions ?    Supine: ?AA heelslides on Lt 10 x 5 sed hold ?    Standing: ?RDL 12# KB 2x15 ?Squats 2x15 ? ?Manual: ?Lt knee flexion in supine ?Modalities: ?Vaso x 10 min; Lt knee mod pressure, 34 deg ? ? ? ? ?07/07/21 ?Therex: ?     Aerobic: ?Recumbent bike seat 7 x 8 min; partial revolutions ?    Machines: ?LLE only leg press 3x10; 50#; 1 min flexion holds between sets ?LLE knee extension 10# 3x10 (min A from RLE) ?LLE knee flexion 20# 3x10 (min A from RLE) ?FWD step ups onto 6" step 2x10 bil; intermittent UE support needed ?LLE on 4" step: Rt heel tap 2x10 ? ?07/03/21 ?Therex: ?     Aerobic: ?Recumbent bike seat 7 x 8 min; partial revolutions ?    Machines: ?LLE only leg press 3x10; 50#; 1 min flexion holds between sets ?    Sitting: ?AA Lt knee flexion 10 x 10 sec hold; RLE providing assist ? ?Manual: ?Lt knee flexion in sitting with contract/relax ?Modalities: ?Vaso x 10 min, Lt knee, mod pressure ? ? ? ?PATIENT EDUCATION:  ?Education details: Access Code: [email or web address] ?Person educated: Patient and Spouse ?Education method: Explanation, Demonstration, Tactile cues, Verbal cues, and Handouts ?Education comprehension: verbalized understanding, returned demonstration, verbal cues required, tactile cues required, and needs  further education ? ?HOME EXERCISE PROGRAM: ?Access Code: [email or web address] ?URL: [email or web address] ?Date: 07/09/2021 ?Prepared by: Faustino Congress ? ?Exercises ?- Quad Setting and Stretching  - 2-4 x daily - 7 x weekly - 5-10 sets - 10 reps - prop 5-10 minutes & quad set5 seconds hold ?- Ankle Alphabet in Elevation  - 2-4 x daily - 7 x weekly - 1 sets - 1 reps ?- Supine Heel Slide with Strap  - 2-3 x daily - 7 x weekly - 2-3 sets - 10 reps - 5 seconds hold ?- Supine Straight Leg Raises  - 2-3 x daily - 7 x weekly - 2-3 sets - 10 reps - 5  seconds hold ?- Seated Knee Flexion Extension AROM   - 2-4 x daily - 7 x weekly - 2-3 sets - 10 reps - 5 seconds hold ?- Seated Hamstring Stretch with Strap  - 2-4 x daily - 7 x weekly - 1 sets - 3 reps - 20-30 seconds hold ?- Standing Gastroc Stretch  - 2-4 x daily - 7 x weekly - 1 sets - 3 reps - 20-30 seconds hold ?- Kettlebell Deadlift  - 1 x daily - 7 x weekly - 3 sets - 10 reps ?- Mini Squat with Counter Support  - 1 x daily - 7 x weekly - 3 sets - 10 reps ?- Standing Single Leg Stance with Unilateral Counter Support  - 2 x daily - 7 x weekly - 1 sets - 3 reps - 10 sec hold ?- Heel Raises with Counter Support  - 1 x daily - 7 x weekly - 1 sets - 20 reps ? ? ? ?ASSESSMENT: ?CLINICAL IMPRESSION: ?Pt tolerated session well today demonstrating slow improvement in Lt knee flexion.  Did add additional strengthening exercises to HEP today to work on functional strengthening.  Will continue to benefit from PT to maximize function. ? ?OBJECTIVE IMPAIRMENTS Abnormal gait, decreased activity tolerance, decreased balance, decreased endurance, decreased knowledge of use of DME, decreased mobility, decreased ROM, decreased strength, increased edema, impaired flexibility, postural dysfunction, and pain.  ? ?ACTIVITY LIMITATIONS community activity, driving, and yard work.  ? ?PERSONAL FACTORS 1 comorbidity: arthritis, hypoglemic syndrome, left ear hearing loss, seizure last one  41yr ago  are also affecting patient's functional outcome.  ? ?REHAB POTENTIAL: Good ? ?CLINICAL DECISION MAKING: Stable/uncomplicated ? ?EVALUATION COMPLEXITY: Low ? ? ?GOALS: ?Goals reviewed with patient? YAlfred Levins

## 2021-07-14 ENCOUNTER — Encounter: Payer: PPO | Admitting: Physical Therapy

## 2021-07-15 ENCOUNTER — Ambulatory Visit: Payer: PPO | Admitting: Physical Therapy

## 2021-07-15 ENCOUNTER — Encounter: Payer: Self-pay | Admitting: Physical Therapy

## 2021-07-15 ENCOUNTER — Other Ambulatory Visit: Payer: Self-pay

## 2021-07-15 DIAGNOSIS — R600 Localized edema: Secondary | ICD-10-CM | POA: Diagnosis not present

## 2021-07-15 DIAGNOSIS — M25562 Pain in left knee: Secondary | ICD-10-CM

## 2021-07-15 DIAGNOSIS — R2681 Unsteadiness on feet: Secondary | ICD-10-CM | POA: Diagnosis not present

## 2021-07-15 DIAGNOSIS — M6281 Muscle weakness (generalized): Secondary | ICD-10-CM | POA: Diagnosis not present

## 2021-07-15 DIAGNOSIS — M25662 Stiffness of left knee, not elsewhere classified: Secondary | ICD-10-CM

## 2021-07-15 DIAGNOSIS — R2689 Other abnormalities of gait and mobility: Secondary | ICD-10-CM

## 2021-07-15 NOTE — Therapy (Signed)
?OUTPATIENT PHYSICAL THERAPY Treatment ? ? ? ? ? ?Patient Name: Ricky Barton ?MRN: [id number] ?DOB:[birth year], 66 y.o., male ?Today's Date: 07/15/2021 ? ? PT End of Session - 07/15/21 1105   ? ? Visit Number 17   ? Number of Visits 18   ? Date for PT Re-Evaluation 07/18/21   ? Authorization Type [employer]   ? Authorization Time Period $30 co-pay   ? Progress Note Due on Visit 20   ? PT Start Time 1103   ? PT Stop Time 1150   ? PT Time Calculation (min) 47 min   ? Activity Tolerance Patient tolerated treatment well   ? Behavior During Therapy [facility] for tasks assessed/performed   ? ?  ?  ? ?  ? ? ? ? ? ? ? ? ? ? ? ? ? ? ? ?Past Medical History:  ?Diagnosis Date  ? Arthritis   ? Hypoglycemic syndrome   ? 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 OSA on CPAP 09/12/2015  ? Severe by HST  ? Seizures ([place]) 1990s  ? isolated, none since, was on dilantin for 3 yrs, then stopped  ? ?Past Surgical History:  ?Procedure Laterality Date  ? CHOLECYSTECTOMY  1989  ? COLONOSCOPY  03/2011  ? 3 adenomatous polyps, rec rpt 5 yrs (Dr Fuller Plan).  ? COLONOSCOPY  07/2015  ? WNL, rpt 5 yrs Fuller Plan)  ? Head MRI  06/02  ? POLYPECTOMY    ? TOTAL KNEE ARTHROPLASTY Left 05/12/2021  ? Procedure: LEFT TOTAL KNEE ARTHROPLASTY;  Surgeon: Leandrew Koyanagi, MD;  Location: [place];  Service: Orthopedics;  Laterality: Left;  ? ?Patient Active Problem List  ? Diagnosis Date Noted  ? Primary osteoarthritis of left knee 05/12/2021  ? Status post total left knee replacement 05/12/2021  ? Allergic rhinitis 12/09/2020  ? Low serum vitamin B12 12/07/2020  ? Welcome to Medicare preventive visit 12/06/2020  ? Advanced directives, counseling/discussion 12/06/2020  ? Angular cheilitis 12/06/2020  ? 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 Elevated blood pressure reading without diagnosis of hypertension 11/10/2018  ? Renal lesion 03/28/2018  ? Arthralgia of hands, bilateral 09/12/2015  ? OSA on CPAP 09/12/2015  ? Obesity, Class I, BMI  30-34.9 04/05/2014  ? Hyperglycemia 03/30/2013  ? Healthcare maintenance 03/04/2012  ? HLD (hyperlipidemia) 01/18/2009  ? 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 History of seizure 08/15/1991  ? ? ?PCP: Ria Bush, MD ? ?REFERRING PROVIDER: Aundra Dubin, PA-C ? ?REFERRING DIAG: W41.324 (ICD-10-CM) - Hx of total knee replacement, left  ? ?THERAPY DIAG:  ?Stiffness of left knee, not elsewhere classified ? ?Muscle weakness (generalized) ? ?Localized edema ? ?Unsteadiness on feet ? ?Acute pain of left knee ? ?Other abnormalities of gait and mobility ? ?ONSET DATE: 05/12/2021 ? ?PERTINENT HISTORY: ?arthritis, hypoglemic syndrome, left ear hearing loss, seizures  ? ?SUBJECTIVE:  ?SUBJECTIVE STATEMENT: ?He has been doing new exercises.  He still wakes up ~2am for 30 min. He sleeps 5-6 hours total.  ? ?PAIN:  ?Are you having pain? Yes: NPRS scale: upon arrival  1-2/10  and over last week lowest 0/10 and highest 8/10 ?Pain location: knee left medial & lateral down to ankle ?Pain description: soreness and stifffness ?Aggravating factors: tightness c end range movements, general soreness, worst middle of night. ?Relieving factors: meds, ice ? ?PRECAUTIONS: None ?WEIGHT BEARING RESTRICTIONS No ?PATIENT GOALS   play golf, yard work, Animator work, walk & care for dogs.  ? ?  OBJECTIVE:  ? ?PATIENT SURVEYS:  ?05/27/2021 eval: FOTO 49% target 66% ?06/18/21 FOTO 47 ? ?EDEMA: ?05/27/2021 eval: Right knee above knee 44.8cm  around knee  42.8cm below knee  38.5cm ?Left knee   above knee 51.0 cm around knee 48.6cm  below knee  44.5 cm ?4/10: Lt knee around knee 47.7 cm ?4/17: Lt knee joint line: 46 cm ?07/09/21: Lt knee joint line 45.5 cm ? ?LE ROM: ? ?Passive(P) & Active(A) ROM Left ?05/27/21 Left ?06/03/21 Left ?06/09/21 Left ?06/18/21 Left ?06/23/21 Left ?06/30/21 Left ?07/03/21 Left ?07/09/21 Left ?07/15/21  ?Knee flexion P: supine ?84* ?A: seated 79* Supine AROM heel slide: 88 ? Seated ?P:98* Supine: ?P: 95 ?A: 85 (92 after exercises)  Supine: ?A 93 Supine: ?A 95 ?P 98 Supine:  ?A 98 Supine: ?A 99 ?P 102 P:103* ?seated  ?Knee extension P: supine ?-7* ?A: seated LAQ -24*  P: supine ?-3* P:-3 ?(Supine) ?A: -9 (seated LAQ)  P: 0 ?A -4 (seated LAQ)  A -2 ?(Seated LAQ)   ?(Blank rows = not tested) ? ?LE MMT: ? ?MMT Right ?05/27/2021 Left ?05/27/2021 Left ?06/05/21  ?Knee flexion  3-/5 4  ?Knee extension  3-/5 4  ? (Blank rows = not tested) ? ?GAIT:05/27/21 ?Distance walked: 120' ?Assistive device utilized: Single point cane ?Level of assistance: SBA ?Comments: antalgic, decreased stance LLE, left knee flexed in stance and minimal flexion for swing,  hip hiking & circumduction to clear LLE. Step-to pattern.  ? ? ?TODAY'S TREATMENT: ?07/15/2021 ?Therex: ?     Aerobic: ?Precor Recumbent bike x 8 min; seat 7; partial revolutions;  then SciFit seat 10 level 1 with BLEs & BUEs for 4 min.  ?    Seated: ?LLE Hamstring stretch long sit with strap 30 sec hold 2 reps ?    Supine: ?AA heelslides on Lt 10 x 5 sed hold ?LLE Quad stretch hooklying with LE over edge 30 sec 2 reps. ? ?    Standing: ?Dead lift 12# KB 2x15 ?Squats 2x15 ?LLE Gastroc stretch step heel depression 30 sec 2 reps ? ?Manual: ?Lt knee flexion in supine ?Modalities: ?Vaso x 10 min; Lt knee mod pressure, 34 deg ? ? ? 07/15/21 1151  ?PT Education  ?Education Details added stretches to HEP Access Code: [email or web address]  ?Person(s) Educated Patient  ?Methods Explanation;Demonstration;Tactile cues;Verbal cues;Handout  ?Comprehension Verbalized understanding;Returned demonstration  ? ? ? ?Access Code: [email or web address] ?URL: [email or web address] ?Date: 07/15/2021 ?Prepared by: Jamey Reas ? ?Exercises ?added ?- Gastroc Stretch on Step  - 2 x daily - 7 x weekly - 1 sets - 2-3 reps - 30 seconds hold ?- Seated Table Hamstring Stretch with strap dorsiflexion & towel roll under ankle - 2 x daily - 7 x weekly - 1 sets - 2-3 reps - 30 seconds hold ?- Supine Quadriceps Stretch with Strap on Table  - 2 x daily - 7 x  weekly - 1 sets - 2-3 reps - 30 seconds hold ? ? ?07/09/21 ?Therex: ?     Aerobic: ?Recumbent bike x 8 min; seat 7; partial revolutions ?    Supine: ?AA heelslides on Lt 10 x 5 sed hold ?    Standing: ?RDL 12# KB 2x15 ?Squats 2x15 ? ?Manual: ?Lt knee flexion in supine ?Modalities: ?Vaso x 10 min; Lt knee mod pressure, 34 deg ? ? ? ?07/07/21 ?Therex: ?     Aerobic: ?Recumbent bike seat 7 x 8 min; partial revolutions ?    Machines: ?LLE only leg press 3x10; 50#; 1  min flexion holds between sets ?LLE knee extension 10# 3x10 (min A from RLE) ?LLE knee flexion 20# 3x10 (min A from RLE) ?FWD step ups onto 6" step 2x10 bil; intermittent UE support needed ?LLE on 4" step: Rt heel tap 2x10 ? ? ? ?PATIENT EDUCATION:  ?Education details: Access Code: [email or web address] ?Person educated: Patient and Spouse ?Education method: Explanation, Demonstration, Tactile cues, Verbal cues, and Handouts ?Education comprehension: verbalized understanding, returned demonstration, verbal cues required, tactile cues required, and needs further education ? ?HOME EXERCISE PROGRAM: ?Access Code: [email or web address] ?URL: [email or web address] ?Date: 07/09/2021 ?Prepared by: Faustino Congress ? ?Exercises ?- Quad Setting and Stretching  - 2-4 x daily - 7 x weekly - 5-10 sets - 10 reps - prop 5-10 minutes & quad set5 seconds hold ?- Ankle Alphabet in Elevation  - 2-4 x daily - 7 x weekly - 1 sets - 1 reps ?- Supine Heel Slide with Strap  - 2-3 x daily - 7 x weekly - 2-3 sets - 10 reps - 5 seconds hold ?- Supine Straight Leg Raises  - 2-3 x daily - 7 x weekly - 2-3 sets - 10 reps - 5 seconds hold ?- Seated Knee Flexion Extension AROM   - 2-4 x daily - 7 x weekly - 2-3 sets - 10 reps - 5 seconds hold ?- Seated Hamstring Stretch with Strap  - 2-4 x daily - 7 x weekly - 1 sets - 3 reps - 20-30 seconds hold ?- Standing Gastroc Stretch  - 2-4 x daily - 7 x weekly - 1 sets - 3 reps - 20-30 seconds hold ?- Kettlebell Deadlift  - 1 x daily - 7 x weekly - 3 sets -  10 reps ?- Mini Squat with Counter Support  - 1 x daily - 7 x weekly - 3 sets - 10 reps ?- Standing Single Leg Stance with Unilateral Counter Support  - 2 x daily - 7 x weekly - 1 sets - 3 reps - 10 sec h

## 2021-07-16 ENCOUNTER — Ambulatory Visit: Payer: PPO | Admitting: Physical Therapy

## 2021-07-16 ENCOUNTER — Encounter: Payer: Self-pay | Admitting: Physical Therapy

## 2021-07-16 DIAGNOSIS — R2689 Other abnormalities of gait and mobility: Secondary | ICD-10-CM | POA: Diagnosis not present

## 2021-07-16 DIAGNOSIS — R600 Localized edema: Secondary | ICD-10-CM

## 2021-07-16 DIAGNOSIS — M25662 Stiffness of left knee, not elsewhere classified: Secondary | ICD-10-CM

## 2021-07-16 DIAGNOSIS — M6281 Muscle weakness (generalized): Secondary | ICD-10-CM

## 2021-07-16 DIAGNOSIS — R2681 Unsteadiness on feet: Secondary | ICD-10-CM

## 2021-07-16 DIAGNOSIS — M25562 Pain in left knee: Secondary | ICD-10-CM

## 2021-07-16 NOTE — Therapy (Addendum)
OUTPATIENT PHYSICAL THERAPY TREATMENT   OUTPATIENT PHYSICAL THERAPY DISCHARGE SUMMARY (08/19/2021)   Patient Name: Ricky Barton MRN: [id number] DOB:1955-07-01, 66 y.o., male Today's Date: 07/16/2021  PHYSICAL THERAPY DISCHARGE SUMMARY  Visits from Start of Care: 18  Current functional level related to goals / functional outcomes: See below   Remaining deficits: See below   Education / Equipment: HEP   Patient agrees to discharge. Patient goals were partially met. Patient is being discharged due to  did not return after 30 day hold.  08/19/2021  Jamey Reas, PT, DPT Physical Therapist Specializing in Prosthetic Rehab [facility]. 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   COLONOSCOPY  07/2015   WNL, rpt 5 yrs Fuller Plan)   Head MRI  06/02   POLYPECTOMY     TOTAL KNEE ARTHROPLASTY Left 05/12/2021   Procedure: LEFT TOTAL KNEE ARTHROPLASTY;  Surgeon: Leandrew Koyanagi, MD;  Location: [place];  Service: Orthopedics;   Laterality: Left;   Patient Active Problem List   Diagnosis Date Noted   Primary osteoarthritis of left knee 05/12/2021   Status post total left knee replacement 05/12/2021   Allergic rhinitis 12/09/2020   Low serum vitamin B12 12/07/2020   Welcome to Medicare preventive visit 12/06/2020   Advanced directives, counseling/discussion 12/06/2020   Angular cheilitis 12/06/2020   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   Elevated blood pressure reading without diagnosis of hypertension 11/10/2018   Renal lesion 03/28/2018   Arthralgia of hands, bilateral 09/12/2015   OSA on CPAP 09/12/2015   Obesity, Class I, BMI 30-34.9 04/05/2014   Hyperglycemia 03/30/2013   Healthcare maintenance 03/04/2012   HLD (hyperlipidemia) 01/18/2009   ERECTILE DYSFUNCTION, 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   History of seizure 08/15/1991    PCP: Ria Bush, MD  REFERRING PROVIDER: Nathaniel Man  REFERRING DIAG: [phone] (ICD-10-CM) - Hx of total knee replacement, left   THERAPY DIAG:  Stiffness of left knee, not elsewhere classified  Muscle weakness (generalized)  Localized edema  Unsteadiness on feet  Acute pain of left knee  Other abnormalities of gait and mobility  ONSET DATE: 05/12/2021  PERTINENT HISTORY: arthritis, hypoglemic syndrome, left ear hearing loss, seizures   SUBJECTIVE:  SUBJECTIVE STATEMENT: Feeling a  little tight this morning, doing well otherwise, walking dogs and did some light yard work yesterday (only made it about 10 minutes)  PAIN:  Are you having pain? Yes: NPRS scale: upon arrival  1-2/10  and over last week lowest 0/10 and highest 8/10 Pain location: knee left medial & lateral down to ankle Pain description: soreness and stifffness Aggravating factors: tightness c end range movements, general soreness, worst middle of night. Relieving factors: meds, ice  PRECAUTIONS: None WEIGHT BEARING RESTRICTIONS No PATIENT GOALS   play golf, yard work, Animator work, walk &  care for dogs.   OBJECTIVE:   PATIENT SURVEYS:  05/27/2021 eval: FOTO 49% target 66% 06/18/21 FOTO 47 07/16/21 FOTO 61  EDEMA: 05/27/2021 eval: Right knee above knee 44.8cm  around knee  42.8cm below knee  38.5cm Left knee   above knee 51.0 cm around knee 48.6cm  below knee  44.5 cm 4/10: Lt knee around knee 47.7 cm 4/17: Lt knee joint line: 46 cm 07/09/21: Lt knee joint line 45.5 cm  LE ROM:  Passive(P) & Active(A) ROM Left 05/27/21 Left 06/03/21 Left 06/09/21 Left 06/18/21 Left 06/23/21 Left 06/30/21 Left 07/03/21 Left 07/09/21 Left 07/15/21 Left 07/16/21  Knee flexion P: supine 84* A: seated 79* Supine AROM heel slide: 88  Seated P:98* Supine: P: 95 A: 85 (92 after exercises) Supine: A 93 Supine: A 95 P 98 Supine:  A 98 Supine: A 99 P 102 P:103* seated A supine: 101  Knee extension P: supine -7* A: seated LAQ -24*  P: supine -3* P:-3 (Supine) A: -9 (seated LAQ)  P: 0 A -4 (seated LAQ)  A -2 (Seated LAQ)  A: 0 (Seated LAQ)  (Blank rows = not tested)  LE MMT:  MMT Right 05/27/2021 Left 05/27/2021 Left 06/05/21  Knee flexion  3-/5 4  Knee extension  3-/5 4   (Blank rows = not tested)  GAIT:05/27/21 Distance walked: 120' Assistive device utilized: Single point cane Level of assistance: SBA Comments: antalgic, decreased stance LLE, left knee flexed in stance and minimal flexion for swing,  hip hiking & circumduction to clear LLE. Step-to pattern.    TODAY'S TREATMENT: 07/16/21 Therex:      Aerobic: Recumbent bike x 8 min; seat 7 partial to full revolutions     Supine: AA heel slides x 10 reps on Lt      Other: Discussed HEP and current exercises - pt verbalized understanding and deferred reviwe   Gait: Negotiated ramp/curb independently; stairs reciprocally mod I with single handrail     07/15/2021 Therex:      Aerobic: Precor Recumbent bike x 8 min; seat 7; partial revolutions;  then SciFit seat 10 level 1 with BLEs & BUEs for 4 min.      Seated: LLE  Hamstring stretch long sit with strap 30 sec hold 2 reps     Supine: AA heelslides on Lt 10 x 5 sed hold LLE Quad stretch hooklying with LE over edge 30 sec 2 reps.      Standing: Dead lift 12# KB 2x15 Squats 2x15 LLE Gastroc stretch step heel depression 30 sec 2 reps  Manual: Lt knee flexion in supine Modalities: Vaso x 10 min; Lt knee mod pressure, 34 deg    07/15/21 1151  PT Education  Education Details added stretches to HEP Access Code: [email or web address]  Person(s) Educated Patient  Methods Explanation;Demonstration;Tactile cues;Verbal cues;Handout  Comprehension Verbalized understanding;Returned demonstration     Access Code: [email or web address] URL: [email or web address] Date: 07/15/2021 Prepared by:  Vladimir Faster  Exercises added - Theme park manager on Step  - 2 x daily - 7 x weekly - 1 sets - 2-3 reps - 30 seconds hold - Seated Table Hamstring Stretch with strap dorsiflexion & towel roll under ankle - 2 x daily - 7 x weekly - 1 sets - 2-3 reps - 30 seconds hold - Supine Quadriceps Stretch with Strap on Table  - 2 x daily - 7 x weekly - 1 sets - 2-3 reps - 30 seconds hold   07/09/21 Therex:      Aerobic: Recumbent bike x 8 min; seat 7; partial revolutions     Supine: AA heelslides on Lt 10 x 5 sed hold     Standing: RDL 12# KB 2x15 Squats 2x15  Manual: Lt knee flexion in supine Modalities: Vaso x 10 min; Lt knee mod pressure, 34 deg    PATIENT EDUCATION:  Education details: Access Code: [email or web address] Person educated: Patient and Spouse Education method: Explanation, Demonstration, Tactile cues, Verbal cues, and Handouts Education comprehension: verbalized understanding, returned demonstration, verbal cues required, tactile cues required, and needs further education  HOME EXERCISE PROGRAM: Access Code: [email or web address] URL: [email or web address] Date: 07/09/2021 Prepared by: Moshe Cipro  Exercises - Quad Setting and Stretching  - 2-4 x daily -  7 x weekly - 5-10 sets - 10 reps - prop 5-10 minutes & quad set5 seconds hold - Ankle Alphabet in Elevation  - 2-4 x daily - 7 x weekly - 1 sets - 1 reps - Supine Heel Slide with Strap  - 2-3 x daily - 7 x weekly - 2-3 sets - 10 reps - 5 seconds hold - Supine Straight Leg Raises  - 2-3 x daily - 7 x weekly - 2-3 sets - 10 reps - 5 seconds hold - Seated Knee Flexion Extension AROM   - 2-4 x daily - 7 x weekly - 2-3 sets - 10 reps - 5 seconds hold - Seated Hamstring Stretch with Strap  - 2-4 x daily - 7 x weekly - 1 sets - 3 reps - 20-30 seconds hold - Standing Gastroc Stretch  - 2-4 x daily - 7 x weekly - 1 sets - 3 reps - 20-30 seconds hold - Kettlebell Deadlift  - 1 x daily - 7 x weekly - 3 sets - 10 reps - Mini Squat with Counter Support  - 1 x daily - 7 x weekly - 3 sets - 10 reps - Standing Single Leg Stance with Unilateral Counter Support  - 2 x daily - 7 x weekly - 1 sets - 3 reps - 10 sec hold - Heel Raises with Counter Support  - 1 x daily - 7 x weekly - 1 sets - 20 reps    ASSESSMENT: CLINICAL IMPRESSION: Pt has met all goals at this time except FOTO score which improved but not quite to goal.  Will hold PT at this time, and feel overall he's ready for d/c but will await final MD visit before formally discharging.   OBJECTIVE IMPAIRMENTS Abnormal gait, decreased activity tolerance, decreased balance, decreased endurance, decreased knowledge of use of DME, decreased mobility, decreased ROM, decreased strength, increased edema, impaired flexibility, postural dysfunction, and pain.   ACTIVITY LIMITATIONS community activity, driving, and yard work.   PERSONAL FACTORS 1 comorbidity: arthritis, hypoglemic syndrome, left ear hearing loss, seizure last one 93yrs ago  are also affecting patient's functional outcome.   REHAB POTENTIAL: Good  CLINICAL DECISION MAKING: Stable/uncomplicated  EVALUATION COMPLEXITY: Low  GOALS: Goals reviewed with patient? Yes  SHORT TERM GOALS: Target  date: 06/20/2021  Patient independent and verbalizes compliance with initial HEP  Goal status: MET - assessed 06/03/2021  2.  Patient reports 50% improvement in left knee pain.  Goal status: on going - assessed 06/16/21 (still 2-10/10)  3.  PROM left knee extension -3* to flexion 90*  Goal status: MET 06/09/2021  LONG TERM GOALS: Target date: 07/18/2021  Patient will improve FOTO score to 66%  Goal status: NOT MET 07/16/21  2.  Patient reports left knee pain </= 2/10 with standing & gait activities.   Goal status: MET 07/16/21  3.  Left Knee PROM 0* extension to 100* flexion  Goal status: MET 07/15/21  4.  Left Knee AROM anti-gravity -3* extension to 90* flexion  Goal status: MET 07/16/21  5.  Patient ambulates >500' community distances including negotiating ramps, curbs & stairs without device independently.  Goal status: MET 07/16/21  6.  Patient reports & demo tasks to enable return to yard work, walking his dogs & light handyman activities to meet his goals.   Goal status: MET 07/16/21  PLAN: PT FREQUENCY: 3x/week for 2 weeks, then 2x/wk for 6 weeks.   PT DURATION: 8 weeks  PLANNED INTERVENTIONS: Therapeutic exercises, Therapeutic activity, Neuromuscular re-education, Balance training, Gait training, Patient/Family education, Joint manipulation, Joint mobilization, Stair training, Dry Needling, Electrical stimulation, Cryotherapy, Moist heat, scar mobilization, Vasopneumatic device, and Manual therapy  PLAN FOR NEXT SESSION:  hold PT x 30 days, see what MD says, d/c if pt doesn't return  Laureen Abrahams, PT, DPT 07/16/21 12:41 PM

## 2021-07-22 ENCOUNTER — Telehealth: Payer: Self-pay

## 2021-07-22 ENCOUNTER — Ambulatory Visit (INDEPENDENT_AMBULATORY_CARE_PROVIDER_SITE_OTHER): Payer: PPO | Admitting: Orthopaedic Surgery

## 2021-07-22 DIAGNOSIS — Z96652 Presence of left artificial knee joint: Secondary | ICD-10-CM

## 2021-07-22 NOTE — Telephone Encounter (Signed)
VOB submitted for Monovisc, right knee. 

## 2021-07-22 NOTE — Telephone Encounter (Signed)
Noted  

## 2021-07-22 NOTE — Progress Notes (Signed)
? ?Post-Op Visit Note ?  ?Patient: Ricky Barton           ?Date of Birth: 1955-07-07           ?MRN: [id number] ?Visit Date: 07/22/2021 ?PCP: Ria Bush, MD ? ? ?Assessment & Plan: ? ?Chief Complaint:  ?Chief Complaint  ?Patient presents with  ? Left Knee - Routine Post Op  ? ?Visit Diagnoses:  ?1. Status post total left knee replacement   ? ? ?Plan: Mr. Geisel is 10 weeks status post left total knee replacement.  Reports mainly soreness after rehab exercises.  Takes tramadol and melatonin nighttime. ? ?Examination of left knee shows mild to moderate swelling.  Range of motion is functional.  Stable to varus valgus. ? ?Overall Mr. Gatley is doing well from his surgery.  We will check on the status of the [place] for his right knee.  Dental prophylaxis reinforced.  We will see him back in 3 months with two-view x-rays of the left knee. ? ?Follow-Up Instructions: Return in about 3 months (around 10/22/2021).  ? ?Orders:  ?No orders of the defined types were placed in this encounter. ? ?No orders of the defined types were placed in this encounter. ? ? ?Imaging: ?No results found. ? ?PMFS History: ?Patient Active Problem List  ? Diagnosis Date Noted  ? Primary osteoarthritis of left knee 05/12/2021  ? Status post total left knee replacement 05/12/2021  ? Allergic rhinitis 12/09/2020  ? Low serum vitamin B12 12/07/2020  ? Welcome to Medicare preventive visit 12/06/2020  ? Advanced directives, counseling/discussion 12/06/2020  ? Angular cheilitis 12/06/2020  ? 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 Elevated blood pressure reading without diagnosis of hypertension 11/10/2018  ? Renal lesion 03/28/2018  ? Arthralgia of hands, bilateral 09/12/2015  ? OSA on CPAP 09/12/2015  ? Obesity, Class I, BMI 30-34.9 04/05/2014  ? Hyperglycemia 03/30/2013  ? Healthcare maintenance 03/04/2012  ? HLD (hyperlipidemia) 01/18/2009  ? 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 History of seizure 08/15/1991  ? ?Past Medical History:   ?Diagnosis Date  ? Arthritis   ? Hypoglycemic syndrome   ? 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 OSA on CPAP 09/12/2015  ? Severe by HST  ? Seizures ([place]) 1990s  ? isolated, none since, was on dilantin for 3 yrs, then stopped  ?  ?Family History  ?Problem Relation Age of Onset  ? Hypertension Mother   ? Colon polyps Mother   ? Cancer Father 65  ?     stomach cancer  ? Hyperlipidemia Brother   ? CAD Brother 74  ?     MI (smoker)  ? Hyperlipidemia Brother   ? Sleep apnea Brother   ? Pancreatic cancer Brother 27  ? Hyperlipidemia Brother   ? CAD Brother 36  ?     MI (smoker)  ? Hyperlipidemia Brother   ? Multiple sclerosis Brother   ? Colon cancer Neg Hx   ? Esophageal cancer Neg Hx   ? Rectal cancer Neg Hx   ?  ?Past Surgical History:  ?Procedure Laterality Date  ? CHOLECYSTECTOMY  1989  ? COLONOSCOPY  03/2011  ? 3 adenomatous polyps, rec rpt 5 yrs (Dr Fuller Plan).  ? COLONOSCOPY  07/2015  ? WNL, rpt 5 yrs Fuller Plan)  ? Head MRI  06/02  ? POLYPECTOMY    ? TOTAL KNEE ARTHROPLASTY Left 05/12/2021  ? Procedure: LEFT TOTAL KNEE ARTHROPLASTY;  Surgeon: Leandrew Koyanagi, MD;  Location: [facility];  Service: Orthopedics;  Laterality: Left;  ? ?Social History  ? ?Occupational History  ? Occupation: Art therapist  ?  Employer: [employer]  ?  Comment: [employer]  ?Tobacco Use  ? Smoking status: Never  ? Smokeless tobacco: Never  ?Vaping Use  ? Vaping Use: Never used  ?Substance and Sexual Activity  ? Alcohol use: No  ? Drug use: No  ? Sexual activity: Yes  ? ? ? ?

## 2021-07-22 NOTE — Telephone Encounter (Signed)
Please submit for right knee gel inj 

## 2021-08-08 ENCOUNTER — Telehealth: Payer: Self-pay

## 2021-08-08 DIAGNOSIS — M1711 Unilateral primary osteoarthritis, right knee: Secondary | ICD-10-CM

## 2021-08-08 NOTE — Telephone Encounter (Signed)
Called and left a VM for patient to CB to schedule with Dr. Erlinda Hong for gel injection.  Check referral tab

## 2021-08-19 ENCOUNTER — Ambulatory Visit: Payer: PPO | Admitting: Orthopaedic Surgery

## 2021-08-19 ENCOUNTER — Encounter: Payer: Self-pay | Admitting: Orthopaedic Surgery

## 2021-08-19 ENCOUNTER — Telehealth: Payer: Self-pay | Admitting: *Deleted

## 2021-08-19 DIAGNOSIS — M1711 Unilateral primary osteoarthritis, right knee: Secondary | ICD-10-CM

## 2021-08-19 MED ORDER — BUPIVACAINE HCL 0.25 % IJ SOLN
2.0000 mL | INTRAMUSCULAR | Status: AC | PRN
  Administered 2021-08-19: 2 mL via INTRA_ARTICULAR

## 2021-08-19 MED ORDER — LIDOCAINE HCL 1 % IJ SOLN
2.0000 mL | INTRAMUSCULAR | Status: AC | PRN
  Administered 2021-08-19: 2 mL

## 2021-08-19 MED ORDER — HYALURONAN 88 MG/4ML IX SOSY
88.0000 mg | PREFILLED_SYRINGE | INTRA_ARTICULAR | Status: AC | PRN
  Administered 2021-08-19: 88 mg via INTRA_ARTICULAR

## 2021-08-19 NOTE — Telephone Encounter (Signed)
Ortho bundle 90 day in office meeting with Sydnee Levans. Completed.

## 2021-08-19 NOTE — Progress Notes (Signed)
Office Visit Note   Patient: Ricky Barton           Date of Birth: [birth year]           MRN: [id number] Visit Date: 08/19/2021              Requested by: Ria Bush, MD [address] [place],  [place] [address] PCP: Ria Bush, MD   Assessment & Plan: Visit Diagnoses:  1. Unilateral primary osteoarthritis, right knee     Plan: Impression is right knee osteoarthritis.  Today, proceed with right knee Monovisc injection.  He tolerated this well.  He will follow-up with [place] as needed.  Follow-Up Instructions: Return if symptoms worsen or fail to improve.   Orders:  Orders Placed This Encounter  Procedures   Large Joint Inj: R knee   No orders of the defined types were placed in this encounter.     Procedures: Large Joint Inj: R knee on 08/19/2021 2:46 PM Indications: pain Details: 22 G needle, anterolateral approach Medications: 2 mL lidocaine 1 %; 2 mL bupivacaine 0.25 %; 88 mg Hyaluronan 88 MG/4ML     Clinical Data: No additional findings.   Subjective: Chief Complaint  Patient presents with   Right Knee - Pain    Monovisc    HPI patient is a pleasant 66 year old gentleman who comes in today for right knee Monovisc injection.  He has not previously undergone viscosupplementation injection in the past to the right knee     Objective: Vital Signs: There were no vitals taken for this visit.    Ortho Exam unchanged right knee exam  Specialty Comments:  No specialty comments available.  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   Elevated  blood pressure reading without diagnosis of hypertension 11/10/2018   Renal lesion 03/28/2018   Arthralgia of hands, bilateral 09/12/2015   OSA on CPAP 09/12/2015   Obesity, Class I, BMI 30-34.9 04/05/2014   Hyperglycemia 03/30/2013   Healthcare maintenance 03/04/2012   HLD (hyperlipidemia) 01/18/2009   ERECTILE DYSFUNCTION, 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   History of seizure 08/15/1991   Past Medical History:  Diagnosis Date   Arthritis    Hypoglycemic syndrome    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    OSA on CPAP 09/12/2015   Severe by HST   Seizures ([place]) 1990s   isolated, none since, was on dilantin for 3 yrs, then stopped    Family History  Problem Relation Age of Onset   Hypertension Mother    Colon polyps Mother    Cancer Father 81       stomach cancer   Hyperlipidemia Brother    CAD Brother 48       MI (smoker)   Hyperlipidemia Brother    Sleep apnea Brother    Pancreatic cancer Brother 59   Hyperlipidemia Brother    CAD Brother 73       MI (smoker)   Hyperlipidemia Brother    Multiple sclerosis Brother    Colon cancer Neg Hx    Esophageal cancer Neg Hx  Rectal cancer Neg Hx     Past Surgical History:  Procedure Laterality Date   CHOLECYSTECTOMY  1989   COLONOSCOPY  03/2011   3 adenomatous polyps, rec rpt 5 yrs (Dr Fuller Plan).   COLONOSCOPY  07/2015   WNL, rpt 5 yrs Fuller Plan)   Head MRI  06/02   POLYPECTOMY     TOTAL KNEE ARTHROPLASTY Left 05/12/2021   Procedure: LEFT TOTAL KNEE ARTHROPLASTY;  Surgeon: Leandrew Koyanagi, MD;  Location: [facility];  Service: Orthopedics;  Laterality: Left;   Social History   Occupational History   Occupation: Radiographer, therapeutic: [employer]    Comment: [employer]  Tobacco Use   Smoking status: Never   Smokeless tobacco: Never  Vaping Use   Vaping Use: Never used  Substance and Sexual Activity   Alcohol use: No   Drug use: No   Sexual activity: Yes

## 2021-08-22 ENCOUNTER — Encounter: Payer: Self-pay | Admitting: Gastroenterology

## 2021-09-17 ENCOUNTER — Other Ambulatory Visit: Payer: Self-pay

## 2021-09-17 ENCOUNTER — Telehealth: Payer: Self-pay | Admitting: Orthopaedic Surgery

## 2021-09-17 ENCOUNTER — Other Ambulatory Visit: Payer: Self-pay | Admitting: Orthopaedic Surgery

## 2021-09-17 MED ORDER — AMOXICILLIN 500 MG PO TABS: ORAL_TABLET | ORAL | 0 refills | Status: DC

## 2021-09-17 NOTE — Telephone Encounter (Signed)
Pt doesn't have a PCN allergy, aware that we will send in amoxicillin to [facility]. He is aware of how he will need to take this.

## 2021-09-17 NOTE — Telephone Encounter (Signed)
Notified patient.

## 2021-09-17 NOTE — Telephone Encounter (Signed)
Patient called. He has a dental procedure 7/13. Would like antibiotic called in.

## 2021-09-22 ENCOUNTER — Encounter: Payer: Self-pay | Admitting: Gastroenterology

## 2021-09-24 DIAGNOSIS — G4733 Obstructive sleep apnea (adult) (pediatric): Secondary | ICD-10-CM | POA: Diagnosis not present

## 2021-10-23 ENCOUNTER — Ambulatory Visit (INDEPENDENT_AMBULATORY_CARE_PROVIDER_SITE_OTHER): Payer: PPO

## 2021-10-23 ENCOUNTER — Ambulatory Visit: Payer: PPO | Admitting: Orthopaedic Surgery

## 2021-10-23 DIAGNOSIS — M1711 Unilateral primary osteoarthritis, right knee: Secondary | ICD-10-CM

## 2021-10-23 DIAGNOSIS — Z96652 Presence of left artificial knee joint: Secondary | ICD-10-CM | POA: Diagnosis not present

## 2021-10-23 MED ORDER — BUPIVACAINE HCL 0.25 % IJ SOLN
2.0000 mL | INTRAMUSCULAR | Status: AC | PRN
  Administered 2021-10-23: 2 mL via INTRA_ARTICULAR

## 2021-10-23 MED ORDER — METHYLPREDNISOLONE ACETATE 40 MG/ML IJ SUSP
40.0000 mg | INTRAMUSCULAR | Status: AC | PRN
  Administered 2021-10-23: 40 mg via INTRA_ARTICULAR

## 2021-10-23 MED ORDER — LIDOCAINE HCL 1 % IJ SOLN
2.0000 mL | INTRAMUSCULAR | Status: AC | PRN
  Administered 2021-10-23: 2 mL

## 2021-10-23 MED ORDER — TRAMADOL HCL 50 MG PO TABS: 50.0000 mg | ORAL_TABLET | Freq: Two times a day (BID) | ORAL | 2 refills | Status: DC | PRN

## 2021-10-23 NOTE — Progress Notes (Signed)
   Procedure Note  Patient: Ricky Barton             Date of Birth: [birth year]           MRN: [id number]             Visit Date: 10/23/2021  Procedures: Visit Diagnoses:  1. History of left knee replacement   2. Unilateral primary osteoarthritis, right knee     Large Joint Inj: R knee on 10/23/2021 9:13 AM Indications: pain Details: 22 G needle, anterolateral approach Medications: 2 mL lidocaine 1 %; 2 mL bupivacaine 0.25 %; 40 mg methylPREDNISolone acetate 40 MG/ML

## 2021-10-23 NOTE — Progress Notes (Signed)
Post-Op Visit Note   Patient: Ricky Barton           Date of Birth: 09-Feb-1956           MRN: [id number] Visit Date: 10/23/2021 PCP: Ria Bush, MD   Assessment & Plan:  Chief Complaint:  Chief Complaint  Patient presents with   Left Knee - Follow-up   Visit Diagnoses:  1. Unilateral primary osteoarthritis, right knee   2. History of left knee replacement     Plan: Patient is a pleasant 66 year old gentleman who comes in today 6 months status post left total knee replacement 05/12/2021.  He has been doing relatively well.  He still notes some start up stiffness which has slightly improved.  He is also still having decree sensation in the lateral knee.  The other issue he brings up today is right knee pain.  History of osteoarthritis.  He has had cortisone injections in the past with minimal relief.  He did undergo viscosupplementation injection in May which temporarily helped.  The pain he is having is to the entire knee and is worse with activity.  He does take occasional anti-inflammatories and uses various rubs with slight relief.  Left knee exam reveals a fully healed surgical scar without complication.  Range of motion 0 to 120 degrees.  Stable varus valgus stress.  He is neurovascular intact distally.  In regards to the left knee, he is doing well.  He will continue to advance with activity as tolerated.  Dental prophylaxis reinforced.  Follow-up in 6 months for repeat evaluation and 2 view x-rays left knee.  In regards to the right knee, he would like to proceed with cortisone injection today and is aware this may only provide temporary relief.  He will follow-up with [place] as needed for the right knee.  Follow-Up Instructions: Return in about 6 months (around 04/25/2022).   Orders:  Orders Placed This Encounter  Procedures   Large Joint Inj: R knee   XR Knee 1-2 Views Left   Meds ordered this encounter  Medications   traMADol (ULTRAM) 50 MG tablet    Sig: Take 1  tablet (50 mg total) by mouth every 12 (twelve) hours as needed.    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   Elevated blood pressure reading without diagnosis of hypertension 11/10/2018   Renal lesion 03/28/2018   Arthralgia of hands, bilateral 09/12/2015   OSA on CPAP 09/12/2015   Obesity, Class I, BMI 30-34.9 04/05/2014   Hyperglycemia 03/30/2013   Healthcare maintenance 03/04/2012   HLD (hyperlipidemia) 01/18/2009   ERECTILE DYSFUNCTION, 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   History of seizure 08/15/1991   Past Medical History:  Diagnosis Date   Arthritis    Hypoglycemic syndrome    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    OSA on CPAP 09/12/2015   Severe by HST   Seizures ([place]) 1990s   isolated, none since, was  on dilantin for 3 yrs, then stopped    Family History  Problem Relation Age of Onset   Hypertension Mother    Colon polyps Mother    Cancer Father 49       stomach cancer   Hyperlipidemia Brother    CAD Brother 45       MI (smoker)   Hyperlipidemia Brother    Sleep apnea Brother    Pancreatic cancer Brother 38   Hyperlipidemia Brother    CAD Brother 21       MI (smoker)   Hyperlipidemia Brother    Multiple sclerosis Brother    Colon cancer Neg Hx    Esophageal cancer Neg Hx    Rectal cancer Neg Hx     Past Surgical History:  Procedure Laterality Date   CHOLECYSTECTOMY  1989   COLONOSCOPY  03/2011   3  adenomatous polyps, rec rpt 5 yrs (Dr Fuller Plan).   COLONOSCOPY  07/2015   WNL, rpt 5 yrs Fuller Plan)   Head MRI  06/02   POLYPECTOMY     TOTAL KNEE ARTHROPLASTY Left 05/12/2021   Procedure: LEFT TOTAL KNEE ARTHROPLASTY;  Surgeon: Leandrew Koyanagi, MD;  Location: [facility];  Service: Orthopedics;  Laterality: Left;   Social History   Occupational History   Occupation: Radiographer, therapeutic: [employer]    Comment:  Vending  Tobacco Use   Smoking status: Never   Smokeless tobacco: Never  Vaping Use   Vaping Use: Never used  Substance and Sexual Activity   Alcohol use: No   Drug use: No   Sexual activity: Yes

## 2021-10-28 ENCOUNTER — Ambulatory Visit (AMBULATORY_SURGERY_CENTER): Payer: Self-pay

## 2021-10-28 VITALS — Ht 65.5 in | Wt 223.0 lb

## 2021-10-28 DIAGNOSIS — Z86010 Personal history of colonic polyps: Secondary | ICD-10-CM

## 2021-10-28 MED ORDER — PEG 3350-KCL-NA BICARB-NACL 420 G PO SOLR: 4000.0000 mL | Freq: Once | ORAL | 0 refills | Status: AC

## 2021-10-28 NOTE — Progress Notes (Signed)
No egg or soy allergy known to patient  No issues known to pt with past sedation with any surgeries or procedures Patient denies ever being told they had issues or difficulty with intubation  No FH of Malignant Hyperthermia Pt is not on diet pills Pt is not on home 02  Pt is not on blood thinners  Pt denies issues with constipation  No A fib or A flutter Have any cardiac testing pending--NO Pt instructed to use Singlecare.com or GoodRx for a price reduction on prep   

## 2021-11-06 ENCOUNTER — Encounter: Payer: Self-pay | Admitting: Gastroenterology

## 2021-11-11 ENCOUNTER — Telehealth: Payer: Self-pay | Admitting: Orthopaedic Surgery

## 2021-11-14 HISTORY — PX: COLONOSCOPY: SHX174

## 2021-11-18 ENCOUNTER — Ambulatory Visit (AMBULATORY_SURGERY_CENTER): Payer: PPO | Admitting: Gastroenterology

## 2021-11-18 ENCOUNTER — Encounter: Payer: Self-pay | Admitting: Gastroenterology

## 2021-11-18 VITALS — BP 142/93 | HR 81 | Temp 98.5°F | Resp 13 | Ht 65.5 in | Wt 223.0 lb

## 2021-11-18 DIAGNOSIS — Z86010 Personal history of colonic polyps: Secondary | ICD-10-CM | POA: Diagnosis not present

## 2021-11-18 DIAGNOSIS — Z09 Encounter for follow-up examination after completed treatment for conditions other than malignant neoplasm: Secondary | ICD-10-CM

## 2021-11-18 DIAGNOSIS — D123 Benign neoplasm of transverse colon: Secondary | ICD-10-CM

## 2021-11-18 DIAGNOSIS — D128 Benign neoplasm of rectum: Secondary | ICD-10-CM

## 2021-11-18 DIAGNOSIS — G4733 Obstructive sleep apnea (adult) (pediatric): Secondary | ICD-10-CM | POA: Diagnosis not present

## 2021-11-18 MED ORDER — SODIUM CHLORIDE 0.9 % IV SOLN: 500.0000 mL | Freq: Once | INTRAVENOUS | Status: DC

## 2021-11-18 NOTE — Progress Notes (Signed)
VS completed by CW.   Pt's states no medical or surgical changes since previsit or office visit.  

## 2021-11-18 NOTE — Patient Instructions (Signed)

## 2021-11-18 NOTE — Progress Notes (Signed)
Called to room to assist during endoscopic procedure.  Patient ID and intended procedure confirmed with present staff. Received instructions for my participation in the procedure from the performing physician.  

## 2021-11-18 NOTE — Op Note (Signed)
[facility] Patient Name: Ricky Barton Procedure Date: 11/18/2021 9:29 AM MRN: [id number] Endoscopist: Ladene Artist , MD Age: 66 Referring MD:  Date of Birth: Apr 08, 1955 Gender: Male Account #: [id number] Procedure:                Colonoscopy Indications:              Surveillance: Personal history of adenomatous                            polyps on last colonoscopy 5 years ago Medicines:                Monitored Anesthesia Care Procedure:                Pre-Anesthesia Assessment:                           - Prior to the procedure, a History and Physical                            was performed, and patient medications and                            allergies were reviewed. The patient's tolerance of                            previous anesthesia was also reviewed. The risks                            and benefits of the procedure and the sedation                            options and risks were discussed with the patient.                            All questions were answered, and informed consent                            was obtained. Prior Anticoagulants: The patient has                            taken no previous anticoagulant or antiplatelet                            agents. ASA Grade Assessment: III - A patient with                            severe systemic disease. After reviewing the risks                            and benefits, the patient was deemed in                            satisfactory condition to undergo the procedure.  After obtaining informed consent, the colonoscope                            was passed under direct vision. Throughout the                            procedure, the patient's blood pressure, pulse, and                            oxygen saturations were monitored continuously. The                            Olympus CF-HQ190L ([id number]) Colonoscope was                            introduced through the anus  and advanced to the the                            cecum, identified by appendiceal orifice and                            ileocecal valve. The ileocecal valve, appendiceal                            orifice, and rectum were photographed. The quality                            of the bowel preparation was good. The colonoscopy                            was performed without difficulty. The patient                            tolerated the procedure well. Scope In: 9:37:24 AM Scope Out: 9:51:13 AM Scope Withdrawal Time: 0 hours 12 minutes 19 seconds  Total Procedure Duration: 0 hours 13 minutes 49 seconds  Findings:                 The perianal and digital rectal examinations were                            normal.                           Two sessile polyps were found in the mid rectum and                            transverse colon. The polyps were 6 to 8 mm in                            size. These polyps were removed with a cold snare.                            Resection and retrieval were complete.  Anal papilla(e) were hypertrophied.                           Internal hemorrhoids were found during                            retroflexion. The hemorrhoids were small and Grade                            I (internal hemorrhoids that do not prolapse).                           The exam was otherwise without abnormality on                            direct and retroflexion views. Complications:            No immediate complications. Estimated blood loss:                            None. Estimated Blood Loss:     Estimated blood loss: none. Impression:               - Two 6 to 8 mm polyps in the mid rectum and in the                            transverse colon, removed with a cold snare.                            Resected and retrieved.                           - Anal papilla(e) were hypertrophied.                           - Internal hemorrhoids.                            - The examination was otherwise normal on direct                            and retroflexion views. Recommendation:           - Repeat colonoscopy after studies are complete for                            surveillance based on pathology results.                           - Patient has a contact number available for                            emergencies. The signs and symptoms of potential                            delayed complications were discussed with the  patient. Return to normal activities tomorrow.                            Written discharge instructions were provided to the                            patient.                           - Resume previous diet.                           - Continue present medications.                           - Await pathology results. Ladene Artist, MD 11/18/2021 9:55:03 AM This report has been signed electronically.

## 2021-11-18 NOTE — Telephone Encounter (Signed)
Error

## 2021-11-18 NOTE — Progress Notes (Signed)
A and O x3. Report to RN. Tolerated MAC anesthesia well. 

## 2021-11-18 NOTE — Progress Notes (Signed)
History & Physical  Primary Care Physician:  Ria Bush, MD Primary Gastroenterologist: Lucio Edward, MD  CHIEF COMPLAINT:  Personal history of colon polyps   HPI: Ricky Barton is a 66 y.o. male with a personal history of adenomatous colon polyps for surveillance colonoscopy.   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   COLONOSCOPY  07/2016   WNL, rpt 5 yrs Fuller Plan)   Head MRI  08/2000   POLYPECTOMY  2013   TA"s x 3   TOTAL KNEE ARTHROPLASTY Left 05/12/2021   Procedure: LEFT TOTAL KNEE ARTHROPLASTY;  Surgeon: Leandrew Koyanagi, MD;  Location: [facility];  Service: Orthopedics;  Laterality: Left;    Prior to Admission medications   Medication Sig Start Date End Date Taking? Authorizing Provider  amoxicillin (AMOXIL) 500 MG tablet Take 4 tablets 1 hour before dental procedure. 09/17/21  Yes Leandrew Koyanagi, MD  Boswellia-Glucosamine-Vit D (OSTEO BI-FLEX ONE PER DAY PO) Take 2 tablets by mouth daily.   Yes [provider]  cetirizine (ZYRTEC) 10 MG tablet TAKE 1 TABLET BY MOUTH EVERY DAY Patient taking differently: Take 10 mg by mouth daily as needed for allergies. 07/11/20  Yes Flora Lipps, MD  cyanocobalamin 1000 MCG tablet Take 1,000 mcg by mouth daily. Vit b12   Yes [provider]  Omega-3 Fatty Acids (ULTRA OMEGA-3 FISH OIL PO) Take 1 tablet by mouth daily at 6 (six) AM.   Yes [provider]  pravastatin (PRAVACHOL) 20 MG tablet Take 1 tablet (20 mg total) by mouth daily. 12/06/20  Yes Ria Bush, MD   aspirin EC 81 MG tablet Take 1 tablet (81 mg total) by mouth 2 (two) times daily. To be taken after surgery to prevent blood clots Patient taking differently: Take 81 mg by mouth every other day. Mondays, Wednesdays, and Fridays 05/05/21   Aundra Dubin, PA-C  ondansetron (ZOFRAN) 4 MG tablet Take 1 tablet (4 mg total) by mouth every 8 (eight) hours as needed for nausea or vomiting. 05/05/21   Aundra Dubin, PA-C  sildenafil (VIAGRA) 100 MG tablet TAKE 1/2 TO 1 TABLET BY MOUTH 1 HOUR PRIOR AS NEEDED 04/05/14   Ria Bush, MD  traMADol (ULTRAM) 50 MG tablet Take 1 tablet (50 mg total) by mouth every 12 (twelve) hours as needed. Patient not taking: Reported on 10/28/2021 10/23/21   Aundra Dubin, PA-C    Current Outpatient Medications  Medication Sig Dispense Refill   amoxicillin (AMOXIL) 500 MG tablet Take 4 tablets 1 hour before dental procedure. 8 tablet 0   Boswellia-Glucosamine-Vit D (OSTEO BI-FLEX ONE PER DAY PO) Take 2 tablets by mouth daily.     cetirizine (ZYRTEC) 10 MG tablet TAKE 1 TABLET BY MOUTH EVERY DAY (Patient taking differently: Take 10 mg by mouth daily as needed for allergies.) 30 tablet 6   cyanocobalamin 1000 MCG tablet Take 1,000 mcg by mouth daily. Vit b12  Omega-3 Fatty Acids (ULTRA OMEGA-3 FISH OIL PO) Take 1 tablet by mouth daily at 6 (six) AM.     pravastatin (PRAVACHOL) 20 MG tablet Take 1 tablet (20 mg total) by mouth daily. 90 tablet 3   aspirin EC 81 MG tablet Take 1 tablet (81 mg total) by mouth 2 (two) times daily. To be taken after surgery to prevent blood clots (Patient taking differently: Take 81 mg by mouth every other day. Mondays, Wednesdays, and Fridays) 84 tablet 0   ondansetron (ZOFRAN) 4 MG tablet Take 1 tablet (4 mg total) by mouth every 8 (eight) hours as needed for nausea or vomiting. 40 tablet 0   sildenafil (VIAGRA) 100 MG tablet TAKE 1/2 TO 1 TABLET BY MOUTH 1 HOUR PRIOR AS NEEDED 9 tablet 6   traMADol (ULTRAM) 50 MG tablet Take 1  tablet (50 mg total) by mouth every 12 (twelve) hours as needed. (Patient not taking: Reported on 10/28/2021) 30 tablet 2   Current Facility-Administered Medications  Medication Dose Route Frequency Provider Last Rate Last Admin   0.9 %  sodium chloride infusion  500 mL Intravenous Once Ladene Artist, MD        Allergies as of 11/18/2021 - Review Complete 11/18/2021  Allergen Reaction Noted   Piroxicam Rash 11/29/2006    Family History  Problem Relation Age of Onset   Stomach cancer Mother    Hypertension Mother    Colon polyps Mother    Cancer Father 37       stomach cancer   Hyperlipidemia Brother    CAD Brother 73       MI (smoker)   Hyperlipidemia Brother    Sleep apnea Brother    Pancreatic cancer Brother 15   Hyperlipidemia Brother    CAD Brother 60       MI (smoker)   Hyperlipidemia Brother    Multiple sclerosis Brother    Colon cancer Neg Hx    Esophageal cancer Neg Hx    Rectal cancer Neg Hx     Social History   Socioeconomic History   Marital status: Married    Spouse name: Not on file   Number of children: 2   Years of education: Not on file   Highest education level: Not on file  Occupational History   Occupation: Route Social research officer, government: [employer]    Comment: [employer]  Tobacco Use   Smoking status: Never   Smokeless tobacco: Never  Vaping Use   Vaping Use: Never used  Substance and Sexual Activity   Alcohol use: No   Drug use: No   Sexual activity: Yes  Other Topics Concern   Not on file  Social History Narrative   Caffeine: occasional coffee/tea   Married and lives with wife, grown children, 2 dogs   Occupation: [employer]   Activity: no regular exercise, some golfing   Diet: good water, fruits/vegetables daily   Social Determinants of Radio broadcast assistant Strain: Not on file  Food Insecurity: Not on file  Transportation Needs: Not on file  Physical Activity: Not on file  Stress: Not on file  Social  Connections: Not on file  Intimate Partner Violence: Not on file    Review of Systems:  All systems reviewed were negative except where noted in HPI.   Physical Exam: General:  Alert, well-developed, in NAD Head:  Normocephalic and atraumatic. Eyes:  Sclera clear, no icterus.   Conjunctiva pink. Ears:  Normal auditory  acuity. Mouth:  No deformity or lesions.  Neck:  Supple; no masses . Lungs:  Clear throughout to auscultation.   No wheezes, crackles, or rhonchi. No acute distress. Heart:  Regular rate and rhythm; no murmurs. Abdomen:  Soft, nondistended, nontender. No masses, hepatomegaly. No obvious masses.  Normal bowel .    Rectal:  Deferred   Msk:  Symmetrical without gross deformities.. Pulses:  Normal pulses noted. Extremities:  Without edema. Neurologic:  Alert and  oriented x4;  grossly normal neurologically. Skin:  Intact without significant lesions or rashes. Cervical Nodes:  No significant cervical adenopathy. Psych:  Alert and cooperative. Normal mood and affect.   Impression / Plan:   Personal history of adenomatous colon polyps for surveillance colonoscopy.  Pricilla Riffle. Fuller Plan  11/18/2021, 9:35 AM See Shea Evans, [facility], to contact our on call provider

## 2021-11-19 ENCOUNTER — Telehealth: Payer: Self-pay

## 2021-11-19 NOTE — Telephone Encounter (Signed)
  Follow up Call-     11/18/2021    8:52 AM  Call back number  Post procedure Call Back phone  # [phone]  Permission to leave phone message Yes     Patient questions:  Do you have a fever, pain , or abdominal swelling? No. Pain Score  0 *  Have you tolerated food without any problems? Yes.    Have you been able to return to your normal activities? Yes.    Do you have any questions about your discharge instructions: Diet   No. Medications  No. Follow up visit  No.  Do you have questions or concerns about your Care? No.  Actions: * If pain score is 4 or above: No action needed, pain <4.

## 2021-11-21 ENCOUNTER — Encounter: Payer: Self-pay | Admitting: Gastroenterology

## 2021-11-29 ENCOUNTER — Other Ambulatory Visit: Payer: Self-pay | Admitting: Family Medicine

## 2021-12-10 ENCOUNTER — Ambulatory Visit: Payer: PPO | Admitting: Primary Care

## 2021-12-10 ENCOUNTER — Telehealth: Payer: Self-pay | Admitting: Primary Care

## 2021-12-10 ENCOUNTER — Encounter: Payer: Self-pay | Admitting: Primary Care

## 2021-12-10 DIAGNOSIS — G4733 Obstructive sleep apnea (adult) (pediatric): Secondary | ICD-10-CM

## 2021-12-10 DIAGNOSIS — Z9989 Dependence on other enabling machines and devices: Secondary | ICD-10-CM | POA: Diagnosis not present

## 2021-12-10 NOTE — Addendum Note (Signed)
Addended by: Irine Seal B on: 12/10/2021 03:50 PM   Modules accepted: Orders

## 2021-12-10 NOTE — Patient Instructions (Signed)
Excellent compliance with CPAP, sleep apnea is well controlled on current pressure settings No changes needed today Continue to wear CPAP every night for minimum 4 to 6 hours or longer Continue to work on weight loss as able Do not drive experiencing excessive daytime sleepiness fatigue  Orders Renew CPAP supplies with [employer]  Follow-up 1 year with [facility] NP or sooner for sleep apnea  Sleep Apnea Sleep apnea is a condition in which breathing pauses or becomes shallow during sleep. People with sleep apnea usually snore loudly. They may have times when they gasp and stop breathing for 10 seconds or more during sleep. This may happen many times during the night. Sleep apnea disrupts your sleep and keeps your body from getting the rest that it needs. This condition can increase your risk of certain health problems, including: Heart attack. Stroke. Obesity. Type 2 diabetes. Heart failure. Irregular heartbeat. High blood pressure. The goal of treatment is to help you breathe normally again. What are the causes?  The most common cause of sleep apnea is a collapsed or blocked airway. There are three kinds of sleep apnea: Obstructive sleep apnea. This kind is caused by a blocked or collapsed airway. Central sleep apnea. This kind happens when the part of the brain that controls breathing does not send the correct signals to the muscles that control breathing. Mixed sleep apnea. This is a combination of obstructive and central sleep apnea. What increases the risk? You are more likely to develop this condition if you: Are overweight. Smoke. Have a smaller than normal airway. Are older. Are male. Drink alcohol. Take sedatives or tranquilizers. Have a family history of sleep apnea. Have a tongue or tonsils that are larger than normal. What are the signs or symptoms? Symptoms of this condition include: Trouble staying asleep. Loud snoring. Morning headaches. Waking up  gasping. Dry mouth or sore throat in the morning. Daytime sleepiness and tiredness. If you have daytime fatigue because of sleep apnea, you may be more likely to have: Trouble concentrating. Forgetfulness. Irritability or mood swings. Personality changes. Feelings of depression. Sexual dysfunction. This may include loss of interest if you are male, or erectile dysfunction if you are male. How is this diagnosed? This condition may be diagnosed with: A medical history. A physical exam. A series of tests that are done while you are sleeping (sleep study). These tests are usually done in a sleep lab, but they may also be done at home. How is this treated? Treatment for this condition aims to restore normal breathing and to ease symptoms during sleep. It may involve managing health issues that can affect breathing, such as high blood pressure or obesity. Treatment may include: Sleeping on your side. Using a decongestant if you have nasal congestion. Avoiding the use of depressants, including alcohol, sedatives, and narcotics. Losing weight if you are overweight. Making changes to your diet. Quitting smoking. Using a device to open your airway while you sleep, such as: An oral appliance. This is a custom-made mouthpiece that shifts your lower jaw forward. A continuous positive airway pressure (CPAP) device. This device blows air through a mask when you breathe out (exhale). A nasal expiratory positive airway pressure (EPAP) device. This device has valves that you put into each nostril. A bi-level positive airway pressure (BIPAP) device. This device blows air through a mask when you breathe in (inhale) and breathe out (exhale). Having surgery if other treatments do not work. During surgery, excess tissue is removed to create a  wider airway. Follow these instructions at home: Lifestyle Make any lifestyle changes that your health care provider recommends. Eat a healthy, well-balanced  diet. Take steps to lose weight if you are overweight. Avoid using depressants, including alcohol, sedatives, and narcotics. Do not use any products that contain nicotine or tobacco. These products include cigarettes, chewing tobacco, and vaping devices, such as e-cigarettes. If you need help quitting, ask your health care provider. General instructions Take over-the-counter and prescription medicines only as told by your health care provider. If you were given a device to open your airway while you sleep, use it only as told by your health care provider. If you are having surgery, make sure to tell your health care provider you have sleep apnea. You may need to bring your device with you. Keep all follow-up visits. This is important. Contact a health care provider if: The device that you received to open your airway during sleep is uncomfortable or does not seem to be working. Your symptoms do not improve. Your symptoms get worse. Get help right away if: You develop: Chest pain. Shortness of breath. Discomfort in your back, arms, or stomach. You have: Trouble speaking. Weakness on one side of your body. Drooping in your face. These symptoms may represent a serious problem that is an emergency. Do not wait to see if the symptoms will go away. Get medical help right away. Call your local emergency services (911 in the [place] Do not drive yourself to the hospital. Summary Sleep apnea is a condition in which breathing pauses or becomes shallow during sleep. The most common cause is a collapsed or blocked airway. The goal of treatment is to restore normal breathing and to ease symptoms during sleep. This information is not intended to replace advice given to you by your health care provider. Make sure you discuss any questions you have with your health care provider. Document Revised: 10/09/2020 Document Reviewed: 02/09/2020 Elsevier Patient Education  [place].

## 2021-12-10 NOTE — Progress Notes (Signed)
$'@Patient'f$  ID: Ricky Barton, male    DOB: 1955-07-22, 66 y.o.   MRN: [id number]  Chief Complaint  Patient presents with   Follow-up    Referring provider: Ria Bush, MD  HPI: 66 year old male, never smoked.  Past medical history significant for OSA, allergic rhinitis, hyperlipidemia, history of seizure, chronic knee pain, hyperglycemia, obesity.  Patient of Dr. Mortimer Fries, last seen in office on 12/12/2019.  Home sleep study on 02/01/2019 showed severe obstructive sleep apnea, AHI 53.4 with SPO2 low 67%.  Previous [facility] pulmonary encounter:  12/09/2020 Patient presents today for an annual follow-up for obstructive sleep apnea. He is sleeping well, knee pain wakes him up at night. No residual daytime fatigue. No issues with CPAP mask or pressure setting. He uses full face nasal mask. An order was placed for him to switch from [facility] to [facility] for cpap supplies. They are needing office visit notes faxed from today's visit. [place] 7. Patient experiences occasional chest tightness in the morning or while working outside. He takes Cetirizine '10mg'$  daily with reported relief of his symptoms.   Airview download 11/06/20-12/05/20 Usage 30/30 days used; 100% > 4 hours Average usage 6 hours 54 mins Pressure 5-15cm h20 (11.2-95%) Airleaks 9.6L/min (95%) AHI 0.4  12/10/2021- Interim hx Patient presents today for annual follow-up for OSA. He is doing well. No issues with CPAP. He is sleeping ok. He has chronic knee pain. Wakes up around 4am, last hour of sleep is not good. Some daytime sleepiness. He uses nasal mask. CPAP hose gets in the way sometimes. He changed DME companies from [place] to [employer].  Airview download 11/09/2021 - 12/08/2021 30/30 days used; 100% greater than 4 hours Average usage 6 hours 46 minutes Pressure 5 to 15 cm H2O (11.4 cm H2O-95%) Air leaks 14.4 L/min (95%) AHI 0.4  Allergies  Allergen Reactions   Piroxicam Rash    blisters    Immunization  History  Administered Date(s) Administered   Fluad Quad(high Dose 65+) 12/06/2020   Influenza Split 03/04/2011, 03/04/2012   Influenza Whole 01/24/2009, 02/26/2010   Influenza,inj,Quad PF,6+ Mos 03/30/2013, 04/05/2014, 11/29/2017, 11/10/2018   PFIZER(Purple Top)SARS-COV-2 Vaccination 06/03/2019, 06/26/2019   PNEUMOCOCCAL CONJUGATE-20 12/06/2020   Td 01/18/1996, 01/24/2009   Tdap 06/07/2017, 11/18/2018   Zoster Recombinat (Shingrix) 11/29/2017, 03/07/2018    Past Medical History:  Diagnosis Date   Arthritis    bilateral knees   Hypoglycemic syndrome    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 To be taken after surgery to prevent blood clots (Patient taking differently: Take 81 mg by mouth every other day. Mondays, Wednesdays, and Fridays) 84 tablet 0   Boswellia-Glucosamine-Vit D (OSTEO BI-FLEX ONE PER DAY PO) Take 2 tablets by mouth daily.     cetirizine (ZYRTEC) 10 MG tablet TAKE 1 TABLET BY MOUTH EVERY DAY (Patient taking differently: Take 10 mg by mouth daily as needed for allergies.) 30 tablet 6  cyanocobalamin 1000 MCG tablet Take 1,000 mcg by mouth daily. Vit b12     Omega-3 Fatty Acids (ULTRA OMEGA-3 FISH OIL PO) Take 1 tablet by mouth daily at 6 (six) AM.     pravastatin (PRAVACHOL) 20 MG tablet TAKE 1 TABLET BY MOUTH EVERY DAY 90 tablet 0   sildenafil (VIAGRA) 100 MG tablet TAKE 1/2 TO 1 TABLET BY MOUTH 1 HOUR PRIOR AS NEEDED 9 tablet 6   traMADol (ULTRAM) 50 MG tablet Take 1 tablet (50 mg  total) by mouth every 12 (twelve) hours as needed. (Patient not taking: Reported on 10/28/2021) 30 tablet 2   amoxicillin (AMOXIL) 500 MG tablet Take 4 tablets 1 hour before dental procedure. 8 tablet 0   ondansetron (ZOFRAN) 4 MG tablet Take 1 tablet (4 mg total) by mouth every 8 (eight) hours as needed for nausea or vomiting. (Patient not taking: Reported on 12/10/2021) 40 tablet 0   No facility-administered medications prior to visit.   Review of Systems  Review of Systems  Constitutional: Negative.   HENT: Negative.    Respiratory: Negative.     Physical Exam  BP 126/66 (BP Location: Left Arm, Patient Position: Sitting, Cuff Size: Large)   Pulse 80   Temp 98.1 F (36.7 C) (Oral)   Ht '5\' 6"'$  (1.676 m)   Wt 220 lb 12.8 oz (100.2 kg)   SpO2 97%   BMI 35.64 kg/m  Physical Exam Constitutional:      Appearance: Normal appearance.  HENT:     Head: Normocephalic and atraumatic.     Mouth/Throat:     Mouth: Mucous membranes are moist.     Pharynx: Oropharynx is clear.  Cardiovascular:     Rate and Rhythm: Normal rate and regular rhythm.  Pulmonary:     Effort: Pulmonary effort is normal.     Breath sounds: Normal breath sounds.  Musculoskeletal:        General: Normal range of motion.  Skin:    General: Skin is warm and dry.  Neurological:     General: No focal deficit present.     Mental Status: He is alert and oriented to person, place, and time. Mental status is at baseline.  Psychiatric:        Mood and Affect: Mood normal.        Behavior: Behavior normal.        Thought Content: Thought content normal.        Judgment: Judgment normal.      Lab Results:  CBC    Component Value Date/Time   WBC 19.3 (H) 05/13/2021 0500   RBC 4.56 05/13/2021 0500   HGB 14.1 05/13/2021 0500   HCT 40.8 05/13/2021 0500   PLT 329 05/13/2021 0500   MCV 89.5 05/13/2021 0500   MCH 30.9 05/13/2021 0500   MCHC 34.6 05/13/2021 0500   RDW 12.4 05/13/2021 0500   LYMPHSABS 2.7  05/08/2021 1150   MONOABS 0.9 05/08/2021 1150   EOSABS 0.3 05/08/2021 1150   BASOSABS 0.0 05/08/2021 1150    BMET    Component Value Date/Time   NA 138 05/13/2021 0500   K 4.6 05/13/2021 0500   CL 104 05/13/2021 0500   CO2 25 05/13/2021 0500   GLUCOSE 118 (H) 05/13/2021 0500   BUN 8 05/13/2021 0500   CREATININE 0.89 05/13/2021 0500   CALCIUM 8.8 (L) 05/13/2021 0500   GFRNONAA >60 05/13/2021 0500   GFRAA >60 03/06/2019 1709    BNP No results found for: "BNP"  ProBNP No results found for: "PROBNP"  Imaging: No results found.   Assessment & Plan:   OSA on CPAP - Hx severe OSA, well controlled on auto CPAP 5-15cm h20.  It is 100% compliant with use greater than 4 hours over the last 30 days.  She has no residual AHI current pressure settings.  No changes today.  Continue to encourage patient wear CPAP every night for minimum 4 to 6 hours or longer.  Continue to work on weight loss efforts.  Advised against driving experiencing excessive daytime sleepiness or fatigue.  Focus on side sleeping position or elevate head of bed 30 degrees while sleeping.  CPAP supplies with DME company.  Follow-up in 1 year or sooner if needed.     Martyn Ehrich, NP 12/10/2021

## 2021-12-10 NOTE — Assessment & Plan Note (Signed)
-   Hx severe OSA, well controlled on auto CPAP 5-15cm h20.  It is 100% compliant with use greater than 4 hours over the last 30 days.  She has no residual AHI current pressure settings.  No changes today.  Continue to encourage patient wear CPAP every night for minimum 4 to 6 hours or longer.  Continue to work on weight loss efforts.  Advised against driving experiencing excessive daytime sleepiness or fatigue.  Focus on side sleeping position or elevate head of bed 30 degrees while sleeping.  CPAP supplies with DME company.  Follow-up in 1 year or sooner if needed.

## 2021-12-12 NOTE — Telephone Encounter (Signed)
Called and spoke with patient. He has requested more information about the [place] device. Advised him that I could place the info in the mail for him. I verified his address.   Information mailed to patient.

## 2021-12-19 DIAGNOSIS — G4733 Obstructive sleep apnea (adult) (pediatric): Secondary | ICD-10-CM | POA: Diagnosis not present

## 2021-12-30 ENCOUNTER — Ambulatory Visit: Payer: PPO

## 2022-01-05 ENCOUNTER — Ambulatory Visit (INDEPENDENT_AMBULATORY_CARE_PROVIDER_SITE_OTHER): Payer: PPO | Admitting: *Deleted

## 2022-01-05 DIAGNOSIS — Z0000 Encounter for general adult medical examination without abnormal findings: Secondary | ICD-10-CM

## 2022-01-05 NOTE — Patient Instructions (Signed)
Ricky Barton , Thank you for taking time to come for your Medicare Wellness Visit. I appreciate your ongoing commitment to your health goals. Please review the following plan we discussed and let me know if I can assist you in the future.   These are the goals we discussed:  Goals      Patient Stated     No goals        This is a list of the screening recommended for you and due dates:  Health Maintenance  Topic Date Due   Flu Shot  10/14/2021   COVID-19 Vaccine (3 - Pfizer series) 01/21/2022*   Colon Cancer Screening  11/19/2026   Tetanus Vaccine  11/17/2028   Pneumonia Vaccine  Completed   Hepatitis C Screening: USPSTF Recommendation to screen - Ages 18-79 yo.  Completed   Zoster (Shingles) Vaccine  Completed   HPV Vaccine  Aged Out  *Topic was postponed. The date shown is not the original due date.    Advanced directives: Education provided  Conditions/risks identified:   Next appointment: Follow up in one year for your annual wellness visit. 11-21 @ 7:30  LABS   02-12-2023 [facility] 66 Years and Older, Male  Preventive care refers to lifestyle choices and visits with your health care provider that can promote health and wellness. What does preventive care include? A yearly physical exam. This is also called an annual well check. Dental exams once or twice a year. Routine eye exams. Ask your health care provider how often you should have your eyes checked. Personal lifestyle choices, including: Daily care of your teeth and gums. Regular physical activity. Eating a healthy diet. Avoiding tobacco and drug use. Limiting alcohol use. Practicing safe sex. Taking low doses of aspirin every day. Taking vitamin and mineral supplements as recommended by your health care provider. What happens during an annual well check? The services and screenings done by your health care provider during your annual well check will depend on your age, overall health,  lifestyle risk factors, and family history of disease. Counseling  Your health care provider may ask you questions about your: Alcohol use. Tobacco use. Drug use. Emotional well-being. Home and relationship well-being. Sexual activity. Eating habits. History of falls. Memory and ability to understand (cognition). Work and work Statistician. Screening  You may have the following tests or measurements: Height, weight, and BMI. Blood pressure. Lipid and cholesterol levels. These may be checked every 5 years, or more frequently if you are over 72 years old. Skin check. Lung cancer screening. You may have this screening every year starting at age 59 if you have a 30-pack-year history of smoking and currently smoke or have quit within the past 15 years. Fecal occult blood test (FOBT) of the stool. You may have this test every year starting at age 74. Flexible sigmoidoscopy or colonoscopy. You may have a sigmoidoscopy every 5 years or a colonoscopy every 10 years starting at age 47. Prostate cancer screening. Recommendations will vary depending on your family history and other risks. Hepatitis C blood test. Hepatitis B blood test. Sexually transmitted disease (STD) testing. Diabetes screening. This is done by checking your blood sugar (glucose) after you have not eaten for a while (fasting). You may have this done every 1-3 years. Abdominal aortic aneurysm (AAA) screening. You may need this if you are a current or former smoker. Osteoporosis. You may be screened starting at age 16 if you are at high risk. Talk with your  health care provider about your test results, treatment options, and if necessary, the need for more tests. Vaccines  Your health care provider may recommend certain vaccines, such as: Influenza vaccine. This is recommended every year. Tetanus, diphtheria, and acellular pertussis (Tdap, Td) vaccine. You may need a Td booster every 10 years. Zoster vaccine. You may need this  after age 43. Pneumococcal 13-valent conjugate (PCV13) vaccine. One dose is recommended after age 32. Pneumococcal polysaccharide (PPSV23) vaccine. One dose is recommended after age 13. Talk to your health care provider about which screenings and vaccines you need and how often you need them. This information is not intended to replace advice given to you by your health care provider. Make sure you discuss any questions you have with your health care provider. Document Released: 03/29/2015 Document Revised: 11/20/2015 Document Reviewed: 01/01/2015 Elsevier Interactive Patient Education  2017 [place] Prevention in the Home Falls can cause injuries. They can happen to people of all ages. There are many things you can do to make your home safe and to help prevent falls. What can I do on the outside of my home? Regularly fix the edges of walkways and driveways and fix any cracks. Remove anything that might make you trip as you walk through a door, such as a raised step or threshold. Trim any bushes or trees on the path to your home. Use bright outdoor lighting. Clear any walking paths of anything that might make someone trip, such as rocks or tools. Regularly check to see if handrails are loose or broken. Make sure that both sides of any steps have handrails. Any raised decks and porches should have guardrails on the edges. Have any leaves, snow, or ice cleared regularly. Use sand or salt on walking paths during winter. Clean up any spills in your garage right away. This includes oil or grease spills. What can I do in the bathroom? Use night lights. Install grab bars by the toilet and in the tub and shower. Do not use towel bars as grab bars. Use non-skid mats or decals in the tub or shower. If you need to sit down in the shower, use a plastic, non-slip stool. Keep the floor dry. Clean up any water that spills on the floor as soon as it happens. Remove soap buildup in the tub or  shower regularly. Attach bath mats securely with double-sided non-slip rug tape. Do not have throw rugs and other things on the floor that can make you trip. What can I do in the bedroom? Use night lights. Make sure that you have a light by your bed that is easy to reach. Do not use any sheets or blankets that are too big for your bed. They should not hang down onto the floor. Have a firm chair that has side arms. You can use this for support while you get dressed. Do not have throw rugs and other things on the floor that can make you trip. What can I do in the kitchen? Clean up any spills right away. Avoid walking on wet floors. Keep items that you use a lot in easy-to-reach places. If you need to reach something above you, use a strong step stool that has a grab bar. Keep electrical cords out of the way. Do not use floor polish or wax that makes floors slippery. If you must use wax, use non-skid floor wax. Do not have throw rugs and other things on the floor that can make you trip. What  can I do with my stairs? Do not leave any items on the stairs. Make sure that there are handrails on both sides of the stairs and use them. Fix handrails that are broken or loose. Make sure that handrails are as long as the stairways. Check any carpeting to make sure that it is firmly attached to the stairs. Fix any carpet that is loose or worn. Avoid having throw rugs at the top or bottom of the stairs. If you do have throw rugs, attach them to the floor with carpet tape. Make sure that you have a light switch at the top of the stairs and the bottom of the stairs. If you do not have them, ask someone to add them for you. What else can I do to help prevent falls? Wear shoes that: Do not have high heels. Have rubber bottoms. Are comfortable and fit you well. Are closed at the toe. Do not wear sandals. If you use a stepladder: Make sure that it is fully opened. Do not climb a closed stepladder. Make  sure that both sides of the stepladder are locked into place. Ask someone to hold it for you, if possible. Clearly mark and make sure that you can see: Any grab bars or handrails. First and last steps. Where the edge of each step is. Use tools that help you move around (mobility aids) if they are needed. These include: Canes. Walkers. Scooters. Crutches. Turn on the lights when you go into a dark area. Replace any light bulbs as soon as they burn out. Set up your furniture so you have a clear path. Avoid moving your furniture around. If any of your floors are uneven, fix them. If there are any pets around you, be aware of where they are. Review your medicines with your doctor. Some medicines can make you feel dizzy. This can increase your chance of falling. Ask your doctor what other things that you can do to help prevent falls. This information is not intended to replace advice given to you by your health care provider. Make sure you discuss any questions you have with your health care provider. Document Released: 12/27/2008 Document Revised: 08/08/2015 Document Reviewed: 04/06/2014 Elsevier Interactive Patient Education  2017 [employer].

## 2022-01-05 NOTE — Progress Notes (Signed)
Subjective:   Ricky Barton is a 66 y.o. male who presents for an Initial Medicare Annual Wellness Visit.   I connected with  Chrisandra Netters on 01/05/22 by a telephone enabled telemedicine application and verified that I am speaking with the correct person using two identifiers.   I discussed the limitations of evaluation and management by telemedicine. The patient expressed understanding and agreed to proceed.  Patient location: home  Provider location: Tele-health-home   Review of Systems     Cardiac Risk Factors include: advanced age (>65mn, >>34women);family history of premature cardiovascular disease;male gender;obesity (BMI >30kg/m2)     Objective:    Today's Vitals   There is no height or weight on file to calculate BMI.     01/05/2022    9:06 AM 05/27/2021    2:31 PM 05/12/2021    8:26 AM 03/06/2019    8:47 PM 05/30/2017   10:45 PM 07/20/2016    4:18 PM  Advanced Directives  Does Patient Have a Medical Advance Directive? No No No No No No  Would patient like information on creating a medical advance directive? No - Patient declined No - Patient declined No - Patient declined No - Patient declined No - Patient declined     Current Medications (verified) Outpatient Encounter Medications as of 01/05/2022  Medication Sig   aspirin EC 81 MG tablet Take 1 tablet (81 mg total) by mouth 2 (two) times daily. To be taken after surgery to prevent blood clots (Patient taking differently: Take 81 mg by mouth every other day. Mondays, Wednesdays, and Fridays)   Boswellia-Glucosamine-Vit D (OSTEO BI-FLEX ONE PER DAY PO) Take 2 tablets by mouth daily.   cetirizine (ZYRTEC) 10 MG tablet TAKE 1 TABLET BY MOUTH EVERY DAY (Patient taking differently: Take 10 mg by mouth daily as needed for allergies.)   cyanocobalamin 1000 MCG tablet Take 1,000 mcg by mouth daily. Vit b12   Omega-3 Fatty Acids (ULTRA OMEGA-3 FISH OIL PO) Take 1 tablet by mouth daily at 6 (six) AM.    pravastatin (PRAVACHOL) 20 MG tablet TAKE 1 TABLET BY MOUTH EVERY DAY   sildenafil (VIAGRA) 100 MG tablet TAKE 1/2 TO 1 TABLET BY MOUTH 1 HOUR PRIOR AS NEEDED   traMADol (ULTRAM) 50 MG tablet Take 1 tablet (50 mg total) by mouth every 12 (twelve) hours as needed. (Patient not taking: Reported on 10/28/2021)   No facility-administered encounter medications on file as of 01/05/2022.    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   COLONOSCOPY  07/2016   WNL, rpt 5 yrs (Fuller Plan   Head MRI  08/2000   POLYPECTOMY  2013   TA"s x 3   TOTAL KNEE ARTHROPLASTY Left 05/12/2021   Procedure: LEFT TOTAL KNEE ARTHROPLASTY;  Surgeon: XLeandrew Koyanagi MD;  [facility]  Service: Orthopedics;  Laterality: Left;   Family History  Problem Relation Age  of Onset   Stomach cancer Mother    Hypertension Mother    Colon polyps Mother    Cancer Father 61       stomach cancer   Hyperlipidemia Brother    CAD Brother 55       MI (smoker)   Hyperlipidemia Brother    Sleep apnea Brother    Pancreatic cancer Brother 16   Hyperlipidemia Brother    CAD Brother 59       MI (smoker)   Hyperlipidemia Brother    Multiple sclerosis Brother    Colon cancer Neg Hx    Esophageal cancer Neg Hx    Rectal cancer Neg Hx    Social History   Socioeconomic History   Marital status: Married    Spouse name: Not on file   Number of children: 2   Years of education: Not on file   Highest education level: Not on file  Occupational  History   Occupation: Route Social research officer, government: [employer]    Comment: [employer]  Tobacco Use   Smoking status: Never   Smokeless tobacco: Never  Vaping Use   Vaping Use: Never used  Substance and Sexual Activity   Alcohol use: No   Drug use: No   Sexual activity: Yes  Other Topics Concern   Not on file  Social History Narrative   Caffeine: occasional coffee/tea   Married and lives with wife, grown children, 2 dogs   Occupation: [employer]   Activity: no regular exercise, some golfing   Diet: good water, fruits/vegetables daily   Social Determinants of Health   Financial Resource Strain: Low Risk  (01/05/2022)   Overall Financial Resource Strain (CARDIA)    Difficulty of Paying Living Expenses: Not hard at all  Food Insecurity: No Food Insecurity (01/05/2022)   Hunger Vital Sign    Worried About Running Out of Food in the Last Year: Never true    Ran Out of Food in the Last Year: Never true  Transportation Needs: No Transportation Needs (01/05/2022)   PRAPARE - Hydrologist (Medical): No    Lack of Transportation (Non-Medical): No  Physical Activity: Insufficiently Active (01/05/2022)   Exercise Vital Sign    Days of Exercise per Week: 3 days    Minutes of Exercise per Session: 30 min  Stress: No Stress Concern Present (01/05/2022)   [place]    Feeling of Stress : Not at all  Social Connections: [place] (01/05/2022)   Social Connection and Isolation Panel [NHANES]    Frequency of Communication with Friends and Family: More than three times a week    Frequency of Social Gatherings with Friends and Family: Twice a week    Attends Religious Services: More than 4 times per year    Active Member of [employer] or Organizations: Yes    Attends Music therapist: More than 4 times per year    Marital Status: Married    Tobacco  Counseling Counseling given: Not Answered   Clinical Intake:  Pre-visit preparation completed: Yes  Pain : No/denies pain     Diabetes: No  How often do you need to have someone help you when you read instructions, pamphlets, or other written materials from your doctor or pharmacy?: 1 - Never  Diabetic?  no  Interpreter Needed?: No  Information entered by :: Leroy Kennedy LPN   Activities of Daily Living  01/05/2022    9:13 AM 05/08/2021   11:33 AM  In your present state of health, do you have any difficulty performing the following activities:  Hearing? 1   Vision? 0   Difficulty concentrating or making decisions? 0   Walking or climbing stairs? 0   Dressing or bathing? 0   Doing errands, shopping? 0 0  Preparing Food and eating ? N   Using the Toilet? N   In the past six months, have you accidently leaked urine? N   Do you have problems with loss of bowel control? N   Managing your Medications? N   Managing your Finances? N   Housekeeping or managing your Housekeeping? N     Patient Care Team: Ria Bush, MD as PCP - General (Family Medicine)  Indicate any recent Medical Services you may have received from other than [facility] providers in the past year (date may be approximate).     Assessment:   This is a routine wellness examination for Ricky Barton.  Hearing/Vision screen Hearing Screening - Comments:: Left hearing loss  Vision Screening - Comments:: Up to date [facility]  Dietary issues and exercise activities discussed: Current Exercise Habits: Structured exercise class, Time (Minutes): 30, Frequency (Times/Week): 3, Weekly Exercise (Minutes/Week): 90, Intensity: Mild, Exercise limited by: orthopedic condition(s)   Goals Addressed             This Visit's Progress    Patient Stated       No goals       Depression Screen    01/05/2022    9:13 AM 12/06/2020   10:59 AM 11/14/2019   10:36 AM 11/10/2018   12:11 PM 10/19/2017    10:48 AM 10/06/2016    4:14 PM  PHQ 2/9 Scores  PHQ - 2 Score 0 0 0 0 0 0  PHQ- 9 Score 0         Fall Risk    01/05/2022    9:06 AM 12/06/2020   10:59 AM  Fall Risk   Falls in the past year? 0 0  Number falls in past yr: 0   Injury with Fall? 0   Follow up Falls evaluation completed;Education provided;Falls prevention discussed     FALL RISK PREVENTION PERTAINING TO THE HOME:  Any stairs in or around the home? Yes  If so, are there any without handrails? No  Home free of loose throw rugs in walkways, pet beds, electrical cords, etc? Yes  Adequate lighting in your home to reduce risk of falls? Yes   ASSISTIVE DEVICES UTILIZED TO PREVENT FALLS:  Life alert? No  Use of a cane, walker or w/c? No  Grab bars in the bathroom? No  Shower chair or bench in shower? Yes  Elevated toilet seat or a handicapped toilet? Yes   TIMED UP AND GO:  Was the test performed? No .    Cognitive Function:        01/05/2022    9:08 AM  6CIT Screen  What Year? 0 points  What month? 0 points  What time? 0 points  Count back from 20 0 points  Months in reverse 0 points  Repeat phrase 0 points  Total Score 0 points    Immunizations Immunization History  Administered Date(s) Administered   Fluad Quad(high Dose 65+) 12/06/2020   Influenza Split 03/04/2011, 03/04/2012   Influenza Whole 01/24/2009, 02/26/2010   Influenza,inj,Quad PF,6+ Mos 03/30/2013, 04/05/2014, 11/29/2017, 11/10/2018   PFIZER(Purple Top)SARS-COV-2 Vaccination 06/03/2019, 06/26/2019   PNEUMOCOCCAL  CONJUGATE-20 12/06/2020   Td 01/18/1996, 01/24/2009   Tdap 06/07/2017, 11/18/2018   Zoster Recombinat (Shingrix) 11/29/2017, 03/07/2018    TDAP status: Up to date  Flu Vaccine status: Due, Education has been provided regarding the importance of this vaccine. Advised may receive this vaccine at local pharmacy or Health Dept. Aware to provide a copy of the vaccination record if obtained from local pharmacy or Health Dept.  Verbalized acceptance and understanding.  Pneumococcal vaccine status: Up to date  Covid-19 vaccine status: Information provided on how to obtain vaccines.   Qualifies for Shingles Vaccine? No   Zostavax completed No   Shingrix Completed?: Yes  Screening Tests Health Maintenance  Topic Date Due   INFLUENZA VACCINE  10/14/2021   COVID-19 Vaccine (3 - Pfizer series) 01/21/2022 (Originally 08/21/2019)   COLONOSCOPY (Pts 45-64yr Insurance coverage will need to be confirmed)  11/19/2026   TETANUS/TDAP  11/17/2028   Pneumonia Vaccine 66 Years old  Completed   Hepatitis C Screening  Completed   Zoster Vaccines- Shingrix  Completed   HPV VACCINES  Aged Out    Health Maintenance  Health Maintenance Due  Topic Date Due   INFLUENZA VACCINE  10/14/2021    Colorectal cancer screening: Type of screening: Colonoscopy. Completed 2023. Repeat every 7 years  Lung Cancer Screening: (Low Dose CT Chest recommended if Age 66-80years, 30 pack-year currently smoking OR have quit w/in 15years.) does not qualify.   Lung Cancer Screening Referral:   Additional Screening:  Hepatitis C Screening: does not qualify; Completed 2017  Vision Screening: Recommended annual ophthalmology exams for early detection of glaucoma and other disorders of the eye. Is the patient up to date with their annual eye exam?  Yes  Who is the provider or what is the name of the office in which the patient attends annual eye exams? [facility] If pt is not established with a provider, would they like to be referred to a provider to establish care? No .   Dental Screening: Recommended annual dental exams for proper oral hygiene  Community Resource Referral / Chronic Care Management: CRR required this visit?  No   CCM required this visit?  No      Plan:     I have personally reviewed and noted the following in the patient's chart:   Medical and social history Use of alcohol, tobacco or illicit drugs   Current medications and supplements including opioid prescriptions. Patient is not currently taking opioid prescriptions. Functional ability and status Nutritional status Physical activity Advanced directives List of other physicians Hospitalizations, surgeries, and ER visits in previous 12 months Vitals Screenings to include cognitive, depression, and falls Referrals and appointments  In addition, I have reviewed and discussed with patient certain preventive protocols, quality metrics, and best practice recommendations. A written personalized care plan for preventive services as well as general preventive health recommendations were provided to patient.     JLeroy Kennedy LPN   [id number]  Nurse Notes:

## 2022-01-23 DIAGNOSIS — G4733 Obstructive sleep apnea (adult) (pediatric): Secondary | ICD-10-CM | POA: Diagnosis not present

## 2022-02-01 ENCOUNTER — Other Ambulatory Visit: Payer: Self-pay | Admitting: Family Medicine

## 2022-02-01 DIAGNOSIS — E785 Hyperlipidemia, unspecified: Secondary | ICD-10-CM

## 2022-02-01 DIAGNOSIS — R739 Hyperglycemia, unspecified: Secondary | ICD-10-CM

## 2022-02-01 DIAGNOSIS — Z125 Encounter for screening for malignant neoplasm of prostate: Secondary | ICD-10-CM

## 2022-02-01 DIAGNOSIS — E538 Deficiency of other specified B group vitamins: Secondary | ICD-10-CM

## 2022-02-03 ENCOUNTER — Other Ambulatory Visit (INDEPENDENT_AMBULATORY_CARE_PROVIDER_SITE_OTHER): Payer: PPO

## 2022-02-03 DIAGNOSIS — E538 Deficiency of other specified B group vitamins: Secondary | ICD-10-CM

## 2022-02-03 DIAGNOSIS — R739 Hyperglycemia, unspecified: Secondary | ICD-10-CM

## 2022-02-03 DIAGNOSIS — E785 Hyperlipidemia, unspecified: Secondary | ICD-10-CM | POA: Diagnosis not present

## 2022-02-03 DIAGNOSIS — Z125 Encounter for screening for malignant neoplasm of prostate: Secondary | ICD-10-CM | POA: Diagnosis not present

## 2022-02-03 LAB — COMPREHENSIVE METABOLIC PANEL
ALT: 15 U/L (ref 0–53)
AST: 17 U/L (ref 0–37)
Albumin: 4.3 g/dL (ref 3.5–5.2)
Alkaline Phosphatase: 75 U/L (ref 39–117)
BUN: 12 mg/dL (ref 6–23)
CO2: 29 mEq/L (ref 19–32)
Calcium: 9.1 mg/dL (ref 8.4–10.5)
Chloride: 103 mEq/L (ref 96–112)
Creatinine, Ser: 0.82 mg/dL (ref 0.40–1.50)
GFR: 91.51 mL/min (ref >60.00)
Glucose, Bld: 95 mg/dL (ref 70–99)
Potassium: 4.2 mEq/L (ref 3.5–5.1)
Sodium: 139 mEq/L (ref 135–145)
Total Bilirubin: 0.5 mg/dL (ref 0.2–1.2)
Total Protein: 7.2 g/dL (ref 6.0–8.3)

## 2022-02-03 LAB — LIPID PANEL
Cholesterol: 142 mg/dL (ref 0–200)
HDL: 46.6 mg/dL (ref >39.00)
LDL Cholesterol: 62 mg/dL (ref 0–99)
NonHDL: 95.46
Total CHOL/HDL Ratio: 3
Triglycerides: 168 mg/dL — ABNORMAL HIGH (ref 0.0–149.0)
VLDL: 33.6 mg/dL (ref 0.0–40.0)

## 2022-02-03 LAB — HEMOGLOBIN A1C: Hgb A1c MFr Bld: 5.6 % (ref 4.6–6.5)

## 2022-02-03 LAB — VITAMIN B12: Vitamin B-12: 1266 pg/mL — ABNORMAL HIGH (ref 211–911)

## 2022-02-03 LAB — PSA: PSA: 0.42 ng/mL (ref 0.10–4.00)

## 2022-02-04 ENCOUNTER — Ambulatory Visit (INDEPENDENT_AMBULATORY_CARE_PROVIDER_SITE_OTHER): Payer: PPO | Admitting: Orthopaedic Surgery

## 2022-02-04 DIAGNOSIS — M1711 Unilateral primary osteoarthritis, right knee: Secondary | ICD-10-CM | POA: Insufficient documentation

## 2022-02-04 NOTE — Progress Notes (Signed)
Office Visit Note   Patient: Ricky Barton           Date of Birth: Sep 29, 1955           MRN: [id number] Visit Date: 02/04/2022              Requested by: Ria Bush, MD [place],  [place] [id number] PCP: Ria Bush, MD   Assessment & Plan: Visit Diagnoses:  1. Primary osteoarthritis of right knee     Plan: Impression is severe right knee degenerative joint disease secondary to Osteoarthritis.  Bone on bone joint space narrowing is seen on radiographs with mild varus alignment.  At this point, conservative treatments fail to provide any significant relief and the pain is severely affecting ADLs and quality of life.  Based on treatment options, the patient has elected to move forward with a knee replacement.  We have discussed the surgical risks that include but are not limited to infection, DVT, leg length discrepancy, stiffness, numbness, tingling, incomplete relief of pain.  Recovery and prognosis were also reviewed.  Ricky Barton met with Jackelyn Poling today to confirm surgery date.  Anticoagulants: No antithrombotic Postop anticoagulation: Aspirin 81 mg Nickel allergy: No Prior DVT/PE: No Tobacco use: No Clearances needed for surgery: None Anticipated discharge dispo: home   Follow-Up Instructions: No follow-ups on file.   Orders:  No orders of the defined types were placed in this encounter.  No orders of the defined types were placed in this encounter.     Procedures: No procedures performed   Clinical Data: No additional findings.   Subjective: Chief Complaint  Patient presents with   Right Knee - Pain    HPI Ricky Barton is a very pleasant 66 year old gentleman who follows up today to discuss treatment options for his right knee pain.  Ricky Barton has known severe bone-on-bone osteoarthritis.  We replaced his left knee in February of this year and has done very well.  Ricky Barton wears a knee brace at all times.  Ricky Barton has not been able to do daily  activities without pain.  Review of Systems  Constitutional: Negative.   All other systems reviewed and are negative.    Objective: Vital Signs: There were no vitals taken for this visit.  Physical Exam Vitals and nursing note reviewed.  Constitutional:      Appearance: Ricky Barton is well-developed.  Pulmonary:     Effort: Pulmonary effort is normal.  Abdominal:     Palpations: Abdomen is soft.  Skin:    General: Skin is warm.  Neurological:     Mental Status: Ricky Barton is alert and oriented to person, place, and time.  Psychiatric:        Behavior: Behavior normal.        Thought Content: Thought content normal.        Judgment: Judgment normal.     Ortho Exam Examination of the right knee shows a varus alignment.  Medial joint line tenderness.  There is significant pain and crepitus with range of motion.  Collaterals and cruciates are stable. Specialty Comments:  No specialty comments available.  Imaging: No results found.   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   Elevated blood pressure reading without diagnosis of hypertension 11/10/2018   Renal lesion 03/28/2018   Arthralgia of hands, bilateral 09/12/2015   OSA on CPAP 09/12/2015   Obesity, Class I, BMI 30-34.9 04/05/2014   Hyperglycemia 03/30/2013   Healthcare maintenance 03/04/2012   HLD (hyperlipidemia) 01/18/2009   ERECTILE DYSFUNCTION, 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   History of seizure 08/15/1991   Past Medical History:  Diagnosis Date   Arthritis    bilateral knees   Hypoglycemic syndrome    Left ear  hearing loss 11/14/2019   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    OSA on CPAP 09/12/2015   Severe by HST   Seizures ([place]) 1990s   isolated, none since, was on dilantin for 3 yrs, then stopped   Sleep apnea    uses CPAP nightly    Family History  Problem Relation Age of Onset   Stomach cancer Mother    Hypertension Mother    Colon polyps Mother    Cancer Father 69       stomach cancer   Hyperlipidemia Brother    CAD Brother 38       MI (smoker)   Hyperlipidemia Brother    Sleep apnea Brother    Pancreatic cancer Brother 13   Hyperlipidemia Brother    CAD Brother 53       MI (smoker)   Hyperlipidemia Brother    Multiple sclerosis Brother    Colon cancer Neg Hx    Esophageal cancer Neg Hx    Rectal cancer Neg Hx     Past Surgical History:  Procedure Laterality Date   CHOLECYSTECTOMY  1989   COLONOSCOPY  03/2011   3 adenomatous polyps, rec rpt 5 yrs (Dr Fuller Plan).   COLONOSCOPY  07/2016   WNL, rpt 5 yrs Fuller Plan)   Head MRI  08/2000   POLYPECTOMY  2013   TA"s x 3   TOTAL KNEE ARTHROPLASTY Left 05/12/2021   Procedure: LEFT TOTAL KNEE ARTHROPLASTY;  Surgeon: Leandrew Koyanagi, MD;  Location: [facility];  Service: Orthopedics;  Laterality: Left;   Social History   Occupational History   Occupation: Radiographer, therapeutic: [employer]    Comment: [employer]  Tobacco Use   Smoking status: Never   Smokeless tobacco: Never  Vaping Use   Vaping Use: Never used  Substance and Sexual Activity   Alcohol use: No   Drug use: No   Sexual activity: Yes

## 2022-02-11 ENCOUNTER — Encounter: Payer: Self-pay | Admitting: Family Medicine

## 2022-02-11 ENCOUNTER — Ambulatory Visit (INDEPENDENT_AMBULATORY_CARE_PROVIDER_SITE_OTHER): Payer: PPO | Admitting: Family Medicine

## 2022-02-11 VITALS — BP 148/80 | HR 91 | Temp 98.2°F | Ht 65.5 in | Wt 221.0 lb

## 2022-02-11 DIAGNOSIS — G4733 Obstructive sleep apnea (adult) (pediatric): Secondary | ICD-10-CM | POA: Diagnosis not present

## 2022-02-11 DIAGNOSIS — E669 Obesity, unspecified: Secondary | ICD-10-CM

## 2022-02-11 DIAGNOSIS — Z23 Encounter for immunization: Secondary | ICD-10-CM | POA: Diagnosis not present

## 2022-02-11 DIAGNOSIS — M1711 Unilateral primary osteoarthritis, right knee: Secondary | ICD-10-CM

## 2022-02-11 DIAGNOSIS — E538 Deficiency of other specified B group vitamins: Secondary | ICD-10-CM

## 2022-02-11 DIAGNOSIS — R030 Elevated blood-pressure reading, without diagnosis of hypertension: Secondary | ICD-10-CM | POA: Diagnosis not present

## 2022-02-11 DIAGNOSIS — Z0000 Encounter for general adult medical examination without abnormal findings: Secondary | ICD-10-CM

## 2022-02-11 DIAGNOSIS — E785 Hyperlipidemia, unspecified: Secondary | ICD-10-CM

## 2022-02-11 DIAGNOSIS — Z7189 Other specified counseling: Secondary | ICD-10-CM

## 2022-02-11 MED ORDER — FISH OIL 1000 MG PO CAPS: 1.0000 | ORAL_CAPSULE | Freq: Every day | ORAL | 0 refills | Status: DC

## 2022-02-11 MED ORDER — PRAVASTATIN SODIUM 20 MG PO TABS: 20.0000 mg | ORAL_TABLET | Freq: Every day | ORAL | 4 refills | Status: DC

## 2022-02-11 MED ORDER — CYANOCOBALAMIN 1000 MCG PO TABS: 1000.0000 ug | ORAL_TABLET | ORAL | Status: AC

## 2022-02-11 NOTE — Assessment & Plan Note (Signed)
Chronic, overall stable on current regimen - continue. The 10-year ASCVD risk score (Arnett DK, et al., 2019) is: 14.5%   Values used to calculate the score:     Age: 66 years     Sex: Male     Is Non-Hispanic African American: No     Diabetic: No     Tobacco smoker: No     Systolic Blood Pressure: 301 mmHg     Is BP treated: No     HDL Cholesterol: 46.6 mg/dL     Total Cholesterol: 142 mg/dL

## 2022-02-11 NOTE — Progress Notes (Signed)
Patient ID: Ricky Barton, male    DOB: [birth year], 66 y.o.   MRN: [id number]  This visit was conducted in person.  BP (!) 148/80 (BP Location: Right Arm, Cuff Size: Large)   Pulse 91   Temp 98.2 F (36.8 C) (Temporal)   Ht 5' 5.5" (1.664 m)   Wt 221 lb (100.2 kg)   SpO2 95%   BMI 36.22 kg/m    CC: CPE Subjective:   HPI: Ricky Barton is a 66 y.o. male presenting on 02/11/2022 for Annual Exam (MCR prt 2. )   Saw health advisor last month for medicare wellness visit. Note reviewed.   Older brother died last week - sounds like he had degenerative neuromuscular condition - couldn't swallow, choked and aspirated.   No results found.  [place] Office Visit from 02/11/2022 in [facility]  PHQ-2 Total Score 0          01/05/2022    9:06 AM 12/06/2020   10:59 AM  Fall Risk   Falls in the past year? 0 0  Number falls in past yr: 0   Injury with Fall? 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   Denies chest pain, dyspnea, palpitations, leg swelling, HA.  No fevers/chills, coughing, abd pain, diarrhea.  Occ dizziness.   Preventative: COLONOSCOPY 03/2011 - 3 adenomatous polyps, rec rpt 5 yrs (Dr Fuller Plan)  Colonoscopy 07/2016 - WNL, rpt 5 yrs Fuller Plan)  Colonoscopy 11/2021 - 2 TAs, int hem,rpt 7 yrs Fuller Plan) Prostate cancer screening - continue PSA yearly  Lung cancer screening - not eligible  AAA screen - not eligible  Flu shot yearly  Prevnar-20 11/2020 Td 2010, Tdap 05/2017  COVID vaccine - [place] 05/2019, 06/2019  Shingrix - 11/2017, 02/2018  Advanced directive planning - does not have set up. Advanced directive packet provided. Would want wife to be HCPOA.  Seat belt use discussed Sunscreen use discussed.  No changing moles on skin. Sleep - averaging 7  hours/night Non smoker Alcohol - none Dentist - Q64mo Eye exam - yearly  Bowel - no constipation  Bladder - no incontinence   Caffeine: occasional coffee/tea   Married and lives with wife, grown children, dogs  Occupation: [employer]   Activity: golfing, walks dogs at least 1 mile, joined [employer]- likes swimming Diet: good water, fruits/vegetables daily      Relevant past medical, surgical, family and social history reviewed and updated as indicated. Interim medical history since our last visit reviewed. Allergies and medications reviewed and updated. Outpatient Medications Prior to Visit  Medication Sig Dispense Refill   aspirin EC 81 MG tablet Take 1 tablet (81 mg total) by mouth 2 (two) times daily. To be taken after surgery to prevent blood clots (Patient taking differently: Take 81 mg by mouth every other day. Mondays, Wednesdays, and Fridays) 84 tablet 0   cetirizine (ZYRTEC) 10 MG tablet TAKE 1 TABLET BY MOUTH EVERY DAY (Patient taking differently: Take 10 mg by mouth daily.) 30 tablet 6   sildenafil (VIAGRA) 100 MG tablet TAKE 1/2 TO 1 TABLET BY MOUTH 1 HOUR PRIOR AS NEEDED 9 tablet 6   Boswellia-Glucosamine-Vit D (OSTEO BI-FLEX ONE PER DAY PO) Take 2 tablets by mouth daily.     cyanocobalamin 1000 MCG tablet Take  1,000 mcg by mouth daily. Vit b12     Omega-3 Fatty Acids (ULTRA OMEGA-3 FISH OIL PO) Take 1 tablet by mouth daily at 6 (six) AM.     pravastatin (PRAVACHOL) 20 MG tablet TAKE 1 TABLET BY MOUTH EVERY DAY 90 tablet 0   traMADol (ULTRAM) 50 MG tablet Take 1 tablet (50 mg total) by mouth every 12 (twelve) hours as needed. (Patient not taking: Reported on 10/28/2021) 30 tablet 2   No facility-administered medications prior to visit.     Per HPI unless specifically indicated in ROS section below Review of Systems  Constitutional:  Negative for activity change, appetite change, chills, fatigue, fever and unexpected weight change.  HENT:  Negative for hearing  loss.   Eyes:  Negative for visual disturbance.  Respiratory:  Negative for cough, chest tightness, shortness of breath and wheezing.   Cardiovascular:  Negative for chest pain, palpitations and leg swelling.  Gastrointestinal:  Negative for abdominal distention, abdominal pain, blood in stool, constipation, diarrhea, nausea and vomiting.  Genitourinary:  Negative for difficulty urinating and hematuria.  Musculoskeletal:  Negative for arthralgias, myalgias and neck pain.  Skin:  Negative for rash.  Neurological:  Negative for dizziness, seizures, syncope and headaches.  Hematological:  Negative for adenopathy. Does not bruise/bleed easily.  Psychiatric/Behavioral:  Negative for dysphoric mood. The patient is not nervous/anxious.     Objective:  BP (!) 148/80 (BP Location: Right Arm, Cuff Size: Large)   Pulse 91   Temp 98.2 F (36.8 C) (Temporal)   Ht 5' 5.5" (1.664 m)   Wt 221 lb (100.2 kg)   SpO2 95%   BMI 36.22 kg/m   Wt Readings from Last 3 Encounters:  02/11/22 221 lb (100.2 kg)  12/10/21 220 lb 12.8 oz (100.2 kg)  11/18/21 223 lb (101.2 kg)      Physical Exam Vitals and nursing note reviewed.  Constitutional:      General: He is not in acute distress.    Appearance: Normal appearance. He is well-developed. He is not ill-appearing.  HENT:     Head: Normocephalic and atraumatic.     Right Ear: Hearing, tympanic membrane, ear canal and external ear normal.     Left Ear: Hearing, tympanic membrane, ear canal and external ear normal.     Nose: Nose normal.     Mouth/Throat:     Mouth: Mucous membranes are moist.     Pharynx: Oropharynx is clear. No oropharyngeal exudate or posterior oropharyngeal erythema.  Eyes:     General: No scleral icterus.    Extraocular Movements: Extraocular movements intact.     Conjunctiva/sclera: Conjunctivae normal.     Pupils: Pupils are equal, round, and reactive to light.  Neck:     Thyroid: No thyroid mass or thyromegaly.     Vascular:  No carotid bruit.  Cardiovascular:     Rate and Rhythm: Normal rate and regular rhythm.     Pulses: Normal pulses.          Radial pulses are 2+ on the right side and 2+ on the left side.     Heart sounds: Normal heart sounds. No murmur heard. Pulmonary:     Effort: Pulmonary effort is normal. No respiratory distress.     Breath sounds: Normal breath sounds. No wheezing, rhonchi or rales.  Abdominal:     General: Bowel sounds are normal. There is no distension.     Palpations: Abdomen is soft. There is no mass.  Tenderness: There is no abdominal tenderness. There is no guarding or rebound.     Hernia: No hernia is present.  Musculoskeletal:        General: Normal range of motion.     Cervical back: Normal range of motion and neck supple.     Right lower leg: No edema.     Left lower leg: No edema.  Lymphadenopathy:     Cervical: No cervical adenopathy.  Skin:    General: Skin is warm and dry.     Findings: No rash.  Neurological:     General: No focal deficit present.     Mental Status: He is alert and oriented to person, place, and time.  Psychiatric:        Mood and Affect: Mood normal.        Behavior: Behavior normal.        Thought Content: Thought content normal.        Judgment: Judgment normal.       Results for orders placed or performed in visit on 02/03/22  PSA  Result Value Ref Range   PSA 0.42 0.10 - 4.00 ng/mL  Vitamin B12  Result Value Ref Range   Vitamin B-12 1,266 (H) 211 - 911 pg/mL  Hemoglobin A1c  Result Value Ref Range   Hgb A1c MFr Bld 5.6 4.6 - 6.5 %  Comprehensive metabolic panel  Result Value Ref Range   Sodium 139 135 - 145 mEq/L   Potassium 4.2 3.5 - 5.1 mEq/L   Chloride 103 96 - 112 mEq/L   CO2 29 19 - 32 mEq/L   Glucose, Bld 95 70 - 99 mg/dL   BUN 12 6 - 23 mg/dL   Creatinine, Ser 0.82 0.40 - 1.50 mg/dL   Total Bilirubin 0.5 0.2 - 1.2 mg/dL   Alkaline Phosphatase 75 39 - 117 U/L   AST 17 0 - 37 U/L   ALT 15 0 - 53 U/L   Total  Protein 7.2 6.0 - 8.3 g/dL   Albumin 4.3 3.5 - 5.2 g/dL   GFR 91.51 >60.00 mL/min   Calcium 9.1 8.4 - 10.5 mg/dL  Lipid panel  Result Value Ref Range   Cholesterol 142 0 - 200 mg/dL   Triglycerides 168.0 (H) 0.0 - 149.0 mg/dL   HDL 46.60 >39.00 mg/dL   VLDL 33.6 0.0 - 40.0 mg/dL   LDL Cholesterol 62 0 - 99 mg/dL   Total CHOL/HDL Ratio 3    NonHDL 95.46     Assessment & Plan:   Problem List Items Addressed This Visit       Unprioritized   Healthcare maintenance - Primary (Chronic)    Preventative protocols reviewed and updated unless pt declined. Discussed healthy diet and lifestyle.       Advanced directives, counseling/discussion (Chronic)    Advanced directive planning - does not have set up. Advanced directive packet provided. Would want wife to be HCPOA.       HLD (hyperlipidemia)    Chronic, overall stable on current regimen - continue. The 10-year ASCVD risk score (Arnett DK, et al., 2019) is: 14.5%   Values used to calculate the score:     Age: 89 years     Sex: Male     Is Non-Hispanic African American: No     Diabetic: No     Tobacco smoker: No     Systolic Blood Pressure: 161 mmHg     Is BP treated: No     HDL  Cholesterol: 46.6 mg/dL     Total Cholesterol: 142 mg/dL       Relevant Medications   pravastatin (PRAVACHOL) 20 MG tablet   Obesity, Class I, BMI 30-34.9    Encouraged healthy diet and lifestyle choices to affect sustainable weight loss. Obesity complicated by comorbidities of OA, HLD, sleep apnea.       OSA on CPAP    Appreciate pulm care, on auto CPAP 5-15 cm H2O.  Benefits from regular CPAP use.       Elevated blood pressure reading without diagnosis of hypertension    BP mildly elevated today. Reviewed decreasing sodium/salt in diet, ensure staying well hydrated.       Low serum vitamin B12    Levels now too high - will drop dose to b12 1028mg MWF.       Primary osteoarthritis of right knee    S/p L knee replacement, pending R  side 03/2022.       Other Visit Diagnoses     Need for influenza vaccination       Relevant Orders   Flu Vaccine QUAD High Dose(Fluad) (Completed)        Meds ordered this encounter  Medications   cyanocobalamin 1000 MCG tablet    Sig: Take 1 tablet (1,000 mcg total) by mouth every Monday, Wednesday, and Friday. Vit b12   Omega-3 Fatty Acids (FISH OIL) 1000 MG CAPS    Sig: Take 1 capsule (1,000 mg total) by mouth daily.    Refill:  0   pravastatin (PRAVACHOL) 20 MG tablet    Sig: Take 1 tablet (20 mg total) by mouth daily.    Dispense:  90 tablet    Refill:  4   Orders Placed This Encounter  Procedures   Flu Vaccine QUAD High Dose(Fluad)     Patient instructions: Flu shot today Bring [place] copy of your advanced directive.  Blood pressures are a bit high - limit salt/sodium in the diet, drink plenty of water, work on weight. If you can monitor BP at home and let [place] if consistently >140/90.  Drop vitamin B12 to MWF dosing Good to see you today. Return as needed or in 1 year for next physical/wellness visit   Follow up plan: Return in about 1 year (around 02/12/2023) for medicare wellness visit, annual exam, prior fasting for blood work.  JRia Bush MD

## 2022-02-11 NOTE — Patient Instructions (Addendum)
Flu shot today Bring [place] a copy of your advanced directive.  Blood pressures are a bit high - limit salt/sodium in the diet, drink plenty of water, work on weight. If you can monitor BP at home and let us know if consistently >140/90.  Drop vitamin B12 to MWF dosing Good to see you today. Return as needed or in 1 year for next physical/wellness visit   Health Maintenance After Age 66 After age 43, you are at a higher risk for certain long-term diseases and infections as well as injuries from falls. Falls are a major cause of broken bones and head injuries in people who are older than age 64. Getting regular preventive care can help to keep you healthy and well. Preventive care includes getting regular testing and making lifestyle changes as recommended by your health care provider. Talk with your health care provider about: Which screenings and tests you should have. A screening is a test that checks for a disease when you have no symptoms. A diet and exercise plan that is right for you. What should I know about screenings and tests to prevent falls? Screening and testing are the best ways to find a health problem early. Early diagnosis and treatment give you the best chance of managing medical conditions that are common after age 78. Certain conditions and lifestyle choices may make you more likely to have a fall. Your health care provider may recommend: Regular vision checks. Poor vision and conditions such as cataracts can make you more likely to have a fall. If you wear glasses, make sure to get your prescription updated if your vision changes. Medicine review. Work with your health care provider to regularly review all of the medicines you are taking, including over-the-counter medicines. Ask your health care provider about any side effects that may make you more likely to have a fall. Tell your health care provider if any medicines that you take make you feel dizzy or sleepy. Strength and balance  checks. Your health care provider may recommend certain tests to check your strength and balance while standing, walking, or changing positions. Foot health exam. Foot pain and numbness, as well as not wearing proper footwear, can make you more likely to have a fall. Screenings, including: Osteoporosis screening. Osteoporosis is a condition that causes the bones to get weaker and break more easily. Blood pressure screening. Blood pressure changes and medicines to control blood pressure can make you feel dizzy. Depression screening. You may be more likely to have a fall if you have a fear of falling, feel depressed, or feel unable to do activities that you used to do. Alcohol use screening. Using too much alcohol can affect your balance and may make you more likely to have a fall. Follow these instructions at home: Lifestyle Do not drink alcohol if: Your health care provider tells you not to drink. If you drink alcohol: Limit how much you have to: 0-1 drink a day for women. 0-2 drinks a day for men. Know how much alcohol is in your drink. In the [place] one drink equals one 12 oz bottle of beer (355 mL), one 5 oz glass of wine (148 mL), or one 1 oz glass of hard liquor (44 mL). Do not use any products that contain nicotine or tobacco. These products include cigarettes, chewing tobacco, and vaping devices, such as e-cigarettes. If you need help quitting, ask your health care provider. Activity  Follow a regular exercise program to stay fit. This will help  you maintain your balance. Ask your health care provider what types of exercise are appropriate for you. If you need a cane or walker, use it as recommended by your health care provider. Wear supportive shoes that have nonskid soles. Safety  Remove any tripping hazards, such as rugs, cords, and clutter. Install safety equipment such as grab bars in bathrooms and safety rails on stairs. Keep rooms and walkways well-lit. General  instructions Talk with your health care provider about your risks for falling. Tell your health care provider if: You fall. Be sure to tell your health care provider about all falls, even ones that seem minor. You feel dizzy, tiredness (fatigue), or off-balance. Take over-the-counter and prescription medicines only as told by your health care provider. These include supplements. Eat a healthy diet and maintain a healthy weight. A healthy diet includes low-fat dairy products, low-fat (lean) meats, and fiber from whole grains, beans, and lots of fruits and vegetables. Stay current with your vaccines. Schedule regular health, dental, and eye exams. Summary Having a healthy lifestyle and getting preventive care can help to protect your health and wellness after age 42. Screening and testing are the best way to find a health problem early and help you avoid having a fall. Early diagnosis and treatment give you the best chance for managing medical conditions that are more common for people who are older than age 68. Falls are a major cause of broken bones and head injuries in people who are older than age 63. Take precautions to prevent a fall at home. Work with your health care provider to learn what changes you can make to improve your health and wellness and to prevent falls. This information is not intended to replace advice given to you by your health care provider. Make sure you discuss any questions you have with your health care provider. Document Revised: 07/22/2020 Document Reviewed: 07/22/2020 Elsevier Patient Education  [place].

## 2022-02-11 NOTE — Assessment & Plan Note (Addendum)
BP mildly elevated today. Reviewed decreasing sodium/salt in diet, ensure staying well hydrated.

## 2022-02-11 NOTE — Assessment & Plan Note (Signed)
Levels now too high - will drop dose to b12 1070mg MWF.

## 2022-02-11 NOTE — Assessment & Plan Note (Signed)
Advanced directive planning - does not have set up. Advanced directive packet provided. Would want wife to be HCPOA.

## 2022-02-11 NOTE — Assessment & Plan Note (Addendum)
Appreciate pulm care, on auto CPAP 5-15 cm H2O.  Benefits from regular CPAP use.

## 2022-02-11 NOTE — Assessment & Plan Note (Signed)
Encouraged healthy diet and lifestyle choices to affect sustainable weight loss. Obesity complicated by comorbidities of OA, HLD, sleep apnea.

## 2022-02-11 NOTE — Assessment & Plan Note (Signed)
Preventative protocols reviewed and updated unless pt declined. Discussed healthy diet and lifestyle.  

## 2022-02-11 NOTE — Assessment & Plan Note (Signed)
S/p L knee replacement, pending R side 03/2022.

## 2022-03-11 DIAGNOSIS — H5203 Hypermetropia, bilateral: Secondary | ICD-10-CM | POA: Diagnosis not present

## 2022-03-11 DIAGNOSIS — H2513 Age-related nuclear cataract, bilateral: Secondary | ICD-10-CM | POA: Diagnosis not present

## 2022-03-19 ENCOUNTER — Other Ambulatory Visit: Payer: Self-pay

## 2022-03-22 ENCOUNTER — Other Ambulatory Visit (HOSPITAL_COMMUNITY): Payer: Self-pay | Admitting: Physician Assistant

## 2022-03-26 ENCOUNTER — Telehealth: Payer: Self-pay | Admitting: Orthopaedic Surgery

## 2022-03-26 NOTE — Pre-Procedure Instructions (Addendum)
Surgical Instructions    Your procedure is scheduled on Friday, April 03, 2022 at 12:15 PM.  Report to [facility] Main Entrance "A" at 9:45 A.M., then check in with the Admitting office.  Call this number if you have problems the morning of surgery:  [phone]   If you have any questions prior to your surgery date call [phone]: Open Monday-Friday 8am-4pm  *If you experience any cold or flu symptoms such as cough, fever, chills, shortness of breath, etc. between now and your scheduled surgery, please notify us.*    Remember:  Do not eat after midnight the night before your surgery  You may drink clear liquids until 9:15 AM the morning of your surgery.   Clear liquids allowed are: Water, Non-Citrus Juices (without pulp), Carbonated Beverages, Clear Tea, Black Coffee Only (NO MILK, CREAM OR POWDERED CREAMER of any kind), and Gatorade.  Patient Instructions  The night before surgery:  No food after midnight. ONLY clear liquids after midnight  The day of surgery (if you do NOT have diabetes):  Drink ONE (1) Pre-Surgery Clear Ensure by 9:15 AM the morning of surgery. Drink in one sitting. Do not sip.  This drink was given to you during your hospital  pre-op appointment visit.  Nothing else to drink after completing the  Pre-Surgery Clear Ensure.         If you have questions, please contact your surgeon's office.     Take these medicines the morning of surgery with A SIP OF WATER:  pravastatin (PRAVACHOL)  cetirizine (ZYRTEC) - if needed  Follow your surgeon's instructions on when to stop Aspirin.  If no instructions were given by your surgeon then you will need to call the office to get those instructions.     As of today, STOP taking any Aleve, Naproxen, Ibuprofen, Motrin, Advil, Goody's, BC's, all herbal medications, fish oil, and all vitamins.                     Do NOT Smoke (Tobacco/Vaping) for 24 hours prior to your procedure.  If you use a CPAP at night,  you may bring your mask/headgear for your overnight stay.   Contacts, glasses, piercing's, hearing aid's, dentures or partials may not be worn into surgery, please bring cases for these belongings.    For patients admitted to the hospital, discharge time will be determined by your treatment team.   Patients discharged the day of surgery will not be allowed to drive home, and someone needs to stay with them for 24 hours.  SURGICAL WAITING ROOM VISITATION Patients having surgery or a procedure may have two support people in the waiting area. Visitors may stay in the waiting area during the procedure and switch out with other visitors if needed. Only 1 support person is allowed in the pre-op area with the patient AFTER the patient is prepped. This person cannot be switched out. Children under the age of 1 must have an adult accompany them who is not the patient. If the patient needs to stay at the hospital during part of their recovery, the visitor guidelines for inpatient rooms apply.  Please refer to the [facility] website for the visitor guidelines for Inpatients (after your surgery is over and you are in a regular room).    Special instructions:   - Preparing For Surgery  Before surgery, you can play an important role. Because skin is not sterile, your skin needs to be as free of germs  as possible. You can reduce the number of germs on your skin by washing with CHG (chlorahexidine gluconate) Soap before surgery.  CHG is an antiseptic cleaner which kills germs and bonds with the skin to continue killing germs even after washing.    Oral Hygiene is also important to reduce your risk of infection.  Remember - BRUSH YOUR TEETH THE MORNING OF SURGERY WITH YOUR REGULAR TOOTHPASTE  Please do not use if you have an allergy to CHG or antibacterial soaps. If your skin becomes reddened/irritated stop using the CHG.  Do not shave (including legs and underarms) for at least 48 hours prior to  first CHG shower. It is OK to shave your face.  Please follow these instructions carefully.   Shower the NIGHT BEFORE SURGERY and the MORNING OF SURGERY  If you chose to wash your hair, wash your hair first as usual with your normal shampoo.  After you shampoo, rinse your hair and body thoroughly to remove the shampoo.  Use CHG Soap as you would any other liquid soap. You can apply CHG directly to the skin and wash gently with a scrungie or a clean washcloth.   Apply the CHG Soap to your body ONLY FROM THE NECK DOWN.  Do not use on open wounds or open sores. Avoid contact with your eyes, ears, mouth and genitals (private parts). Wash Face and genitals (private parts)  with your normal soap.   Wash thoroughly, paying special attention to the area where your surgery will be performed.  Thoroughly rinse your body with warm water from the neck down.  DO NOT shower/wash with your normal soap after using and rinsing off the CHG Soap.  Pat yourself dry with a CLEAN TOWEL.  Wear CLEAN PAJAMAS to bed the night before surgery  Place CLEAN SHEETS on your bed the night before your surgery  DO NOT SLEEP WITH PETS.   Day of Surgery: Take a shower with CHG soap. Do not wear jewelry. Do not wear lotions, powders, colognes, or deodorant. Do not shave 48 hours prior to surgery.  Men may shave face and neck. Do not wear nail polish, gel polish, artificial nails, or any other type of covering on natural nails (fingers and toes) If you have artificial nails or gel coating that need to be removed by a nail salon, please have this removed prior to surgery. Artificial nails or gel coating may interfere with anesthesia's ability to adequately monitor your vital signs. Wear Clean/Comfortable clothing the morning of surgery Do not bring valuables to the hospital.  [facility] is not responsible for any belongings or valuables. Remember to brush your teeth WITH YOUR REGULAR TOOTHPASTE.   Please read  over the following fact sheets that you were given.  If you received a COVID test during your pre-op visit  it is requested that you wear a mask when out in public, stay away from anyone that may not be feeling well and notify your surgeon if you develop symptoms. If you have been in contact with anyone that has tested positive in the last 10 days please notify you surgeon.

## 2022-03-26 NOTE — Telephone Encounter (Signed)
Patient called advised he is going for a dental cleaning Monday and would like an antibiotic sent to his pharmacy (Amoxicillin)  at [facility] in [place]. Patient said he is also having surgery and want to know if taking the medication would interfere with surgery? The number to contact patient is [phone]

## 2022-03-27 ENCOUNTER — Encounter (HOSPITAL_COMMUNITY): Payer: Self-pay

## 2022-03-27 ENCOUNTER — Encounter (HOSPITAL_COMMUNITY)
Admission: RE | Admit: 2022-03-27 | Discharge: 2022-03-27 | Disposition: A | Discharge status: Home / Self Care | Payer: PPO | Source: Ambulatory Visit | Attending: Orthopaedic Surgery | Admitting: Orthopaedic Surgery

## 2022-03-27 ENCOUNTER — Other Ambulatory Visit: Payer: Self-pay

## 2022-03-27 ENCOUNTER — Other Ambulatory Visit: Payer: Self-pay | Admitting: Physician Assistant

## 2022-03-27 VITALS — BP 151/93 | HR 81 | Temp 97.8°F | Resp 17 | Ht 65.0 in | Wt 226.1 lb

## 2022-03-27 DIAGNOSIS — Z01812 Encounter for preprocedural laboratory examination: Secondary | ICD-10-CM | POA: Insufficient documentation

## 2022-03-27 DIAGNOSIS — Z87898 Personal history of other specified conditions: Secondary | ICD-10-CM | POA: Insufficient documentation

## 2022-03-27 DIAGNOSIS — M1711 Unilateral primary osteoarthritis, right knee: Secondary | ICD-10-CM | POA: Insufficient documentation

## 2022-03-27 DIAGNOSIS — Z01818 Encounter for other preprocedural examination: Secondary | ICD-10-CM

## 2022-03-27 LAB — CBC
HCT: 45.1 % (ref 39.0–52.0)
Hemoglobin: 14.7 g/dL (ref 13.0–17.0)
MCH: 30 pg (ref 26.0–34.0)
MCHC: 32.6 g/dL (ref 30.0–36.0)
MCV: 92 fL (ref 80.0–100.0)
Platelets: 306 10*3/uL (ref 150–400)
RBC: 4.9 MIL/uL (ref 4.22–5.81)
RDW: 12.8 % (ref 11.5–15.5)
WBC: 9.1 10*3/uL (ref 4.0–10.5)
nRBC: 0 % (ref 0.0–0.2)

## 2022-03-27 LAB — SURGICAL PCR SCREEN
MRSA, PCR: NEGATIVE
Staphylococcus aureus: NEGATIVE

## 2022-03-27 LAB — BASIC METABOLIC PANEL
Anion gap: 10 (ref 5–15)
BUN: 12 mg/dL (ref 8–23)
CO2: 25 mmol/L (ref 22–32)
Calcium: 9 mg/dL (ref 8.9–10.3)
Chloride: 103 mmol/L (ref 98–111)
Creatinine, Ser: 0.7 mg/dL (ref 0.61–1.24)
GFR, Estimated: >60 mL/min (ref >60)
Glucose, Bld: 105 mg/dL — ABNORMAL HIGH (ref 70–99)
Potassium: 4.2 mmol/L (ref 3.5–5.1)
Sodium: 138 mmol/L (ref 135–145)

## 2022-03-27 MED ORDER — METHOCARBAMOL 750 MG PO TABS: 750.0000 mg | ORAL_TABLET | Freq: Two times a day (BID) | ORAL | 2 refills | Status: DC | PRN

## 2022-03-27 MED ORDER — CEFADROXIL 500 MG PO CAPS: 500.0000 mg | ORAL_CAPSULE | Freq: Two times a day (BID) | ORAL | 0 refills | Status: DC

## 2022-03-27 MED ORDER — OXYCODONE-ACETAMINOPHEN 5-325 MG PO TABS: 1.0000 | ORAL_TABLET | Freq: Four times a day (QID) | ORAL | 0 refills | Status: DC | PRN

## 2022-03-27 MED ORDER — DOCUSATE SODIUM 100 MG PO CAPS: 100.0000 mg | ORAL_CAPSULE | Freq: Every day | ORAL | 2 refills | Status: DC | PRN

## 2022-03-27 MED ORDER — ASPIRIN 81 MG PO TBEC: 81.0000 mg | DELAYED_RELEASE_TABLET | Freq: Two times a day (BID) | ORAL | 0 refills | Status: DC

## 2022-03-27 MED ORDER — ONDANSETRON HCL 4 MG PO TABS: 4.0000 mg | ORAL_TABLET | Freq: Three times a day (TID) | ORAL | 0 refills | Status: DC | PRN

## 2022-03-27 MED ORDER — AMOXICILLIN 500 MG PO CAPS: ORAL_CAPSULE | ORAL | 2 refills | Status: DC

## 2022-03-27 NOTE — Progress Notes (Signed)
PCP - Dr. Ria Bush Cardiologist - Denies Pulmonologist: Geraldo Pitter NP  PPM/ICD - Denies  Chest x-ray - N/A EKG - N/A Stress Test - Denies ECHO - Denies Cardiac Cath - Denies  Sleep Study - OSA CPAP- Yes  Diabetes: Denies  Blood Thinner Instructions: Aspirin Instructions: Follow surgeon's instructions  ERAS Protcol - Yes, PRE-SURGERY Ensure  COVID TEST- N/A   Anesthesia review: No  Patient denies shortness of breath, fever, cough and chest pain at PAT appointment   All instructions explained to the patient, with a verbal understanding of the material. Patient agrees to go over the instructions while at home for a better understanding. Patient also instructed to self quarantine after being tested for COVID-19. The opportunity to ask questions was provided.

## 2022-03-27 NOTE — Telephone Encounter (Signed)
I am assuming dental appointment is prior to surgery and  not within 12 weeks after unless infection/abscess?  Sent in the meds

## 2022-03-29 ENCOUNTER — Other Ambulatory Visit: Payer: Self-pay | Admitting: Physician Assistant

## 2022-03-31 ENCOUNTER — Other Ambulatory Visit: Payer: Self-pay | Admitting: Physician Assistant

## 2022-04-02 ENCOUNTER — Telehealth: Payer: Self-pay | Admitting: *Deleted

## 2022-04-02 ENCOUNTER — Other Ambulatory Visit: Payer: Self-pay | Admitting: *Deleted

## 2022-04-02 DIAGNOSIS — M1711 Unilateral primary osteoarthritis, right knee: Secondary | ICD-10-CM

## 2022-04-02 MED ORDER — TRANEXAMIC ACID 1000 MG/10ML IV SOLN
2000.0000 mg | INTRAVENOUS | Status: DC
  Filled 2022-04-02: qty 20

## 2022-04-02 NOTE — Telephone Encounter (Signed)
Pre-op Ortho bundle call for Right total knee arthroplasty.

## 2022-04-02 NOTE — Care Plan (Signed)
[facility] RNCM call to patient to discuss his upcoming Right total knee arthroplasty with Dr. Erlinda Hong on 04/03/12. He is an Ortho bundle patient through [facility] and is agreeable to case management. He lives with his spouse, who will be assisting after surgery. He has a RW and has received his home CPM through [place]. Anticipate HHPT will be needed after a short hospital stay. Referral made to [facility] after choice provided.Reviewed all post op care instructions. Will continue to follow for needs.

## 2022-04-03 ENCOUNTER — Observation Stay (HOSPITAL_COMMUNITY): Payer: PPO

## 2022-04-03 ENCOUNTER — Ambulatory Visit (HOSPITAL_COMMUNITY): Payer: PPO

## 2022-04-03 ENCOUNTER — Observation Stay (HOSPITAL_COMMUNITY)
Admission: RE | Admit: 2022-04-03 | Discharge: 2022-04-04 | Disposition: A | Discharge status: Home / Self Care | Payer: PPO | Attending: Orthopaedic Surgery | Admitting: Orthopaedic Surgery

## 2022-04-03 ENCOUNTER — Encounter (HOSPITAL_COMMUNITY)
Admission: RE | Disposition: A | Discharge status: Home / Self Care | Payer: Self-pay | Source: Home / Self Care | Attending: Orthopaedic Surgery

## 2022-04-03 ENCOUNTER — Other Ambulatory Visit: Payer: Self-pay

## 2022-04-03 ENCOUNTER — Encounter (HOSPITAL_COMMUNITY): Payer: Self-pay | Admitting: Orthopaedic Surgery

## 2022-04-03 ENCOUNTER — Ambulatory Visit (HOSPITAL_BASED_OUTPATIENT_CLINIC_OR_DEPARTMENT_OTHER): Payer: PPO | Admitting: Certified Registered Nurse Anesthetist

## 2022-04-03 ENCOUNTER — Ambulatory Visit (HOSPITAL_COMMUNITY): Payer: PPO | Admitting: Certified Registered Nurse Anesthetist

## 2022-04-03 DIAGNOSIS — Z79899 Other long term (current) drug therapy: Secondary | ICD-10-CM | POA: Diagnosis not present

## 2022-04-03 DIAGNOSIS — Z96652 Presence of left artificial knee joint: Secondary | ICD-10-CM | POA: Diagnosis not present

## 2022-04-03 DIAGNOSIS — M1711 Unilateral primary osteoarthritis, right knee: Secondary | ICD-10-CM

## 2022-04-03 DIAGNOSIS — Z6836 Body mass index (BMI) 36.0-36.9, adult: Secondary | ICD-10-CM

## 2022-04-03 DIAGNOSIS — Z7982 Long term (current) use of aspirin: Secondary | ICD-10-CM | POA: Diagnosis not present

## 2022-04-03 DIAGNOSIS — Z96651 Presence of right artificial knee joint: Secondary | ICD-10-CM | POA: Diagnosis not present

## 2022-04-03 DIAGNOSIS — G8918 Other acute postprocedural pain: Secondary | ICD-10-CM | POA: Diagnosis not present

## 2022-04-03 DIAGNOSIS — E669 Obesity, unspecified: Secondary | ICD-10-CM

## 2022-04-03 DIAGNOSIS — Z471 Aftercare following joint replacement surgery: Secondary | ICD-10-CM | POA: Diagnosis not present

## 2022-04-03 DIAGNOSIS — G4733 Obstructive sleep apnea (adult) (pediatric): Secondary | ICD-10-CM

## 2022-04-03 DIAGNOSIS — Z9989 Dependence on other enabling machines and devices: Secondary | ICD-10-CM | POA: Diagnosis not present

## 2022-04-03 HISTORY — PX: TOTAL KNEE ARTHROPLASTY: SHX125

## 2022-04-03 LAB — GLUCOSE, CAPILLARY: Glucose-Capillary: 109 mg/dL — ABNORMAL HIGH (ref 70–99)

## 2022-04-03 SURGERY — ARTHROPLASTY, KNEE, TOTAL
Anesthesia: Regional | Site: Knee | Laterality: Right

## 2022-04-03 MED ORDER — OXYCODONE HCL 5 MG PO TABS
5.0000 mg | ORAL_TABLET | ORAL | Status: DC | PRN
  Administered 2022-04-03 – 2022-04-04 (×2): 5 mg via ORAL
  Filled 2022-04-03: qty 2
  Filled 2022-04-03: qty 1
  Filled 2022-04-03: qty 2

## 2022-04-03 MED ORDER — OXYCODONE HCL ER 10 MG PO T12A
10.0000 mg | EXTENDED_RELEASE_TABLET | Freq: Two times a day (BID) | ORAL | Status: DC
  Administered 2022-04-03 (×2): 10 mg via ORAL
  Filled 2022-04-03 (×3): qty 1

## 2022-04-03 MED ORDER — PROMETHAZINE HCL 25 MG/ML IJ SOLN: 6.2500 mg | INTRAMUSCULAR | Status: DC | PRN

## 2022-04-03 MED ORDER — ONDANSETRON HCL 4 MG/2ML IJ SOLN: 4.0000 mg | Freq: Four times a day (QID) | INTRAMUSCULAR | Status: DC | PRN

## 2022-04-03 MED ORDER — LIDOCAINE 2% (20 MG/ML) 5 ML SYRINGE
INTRAMUSCULAR | Status: AC
  Filled 2022-04-03: qty 5

## 2022-04-03 MED ORDER — MIDAZOLAM HCL 2 MG/2ML IJ SOLN
INTRAMUSCULAR | Status: AC
  Administered 2022-04-03: 2 mg via INTRAVENOUS
  Filled 2022-04-03: qty 2

## 2022-04-03 MED ORDER — CEFAZOLIN SODIUM-DEXTROSE 2-4 GM/100ML-% IV SOLN
2.0000 g | INTRAVENOUS | Status: AC
  Administered 2022-04-03: 2 g via INTRAVENOUS
  Filled 2022-04-03: qty 100

## 2022-04-03 MED ORDER — VANCOMYCIN HCL 1000 MG IV SOLR
INTRAVENOUS | Status: AC
  Filled 2022-04-03: qty 20

## 2022-04-03 MED ORDER — TRANEXAMIC ACID-NACL 1000-0.7 MG/100ML-% IV SOLN
1000.0000 mg | Freq: Once | INTRAVENOUS | Status: AC
  Administered 2022-04-03: 1000 mg via INTRAVENOUS
  Filled 2022-04-03: qty 100

## 2022-04-03 MED ORDER — GLYCOPYRROLATE PF 0.2 MG/ML IJ SOSY
PREFILLED_SYRINGE | INTRAMUSCULAR | Status: AC
  Filled 2022-04-03: qty 1

## 2022-04-03 MED ORDER — TRANEXAMIC ACID-NACL 1000-0.7 MG/100ML-% IV SOLN
1000.0000 mg | INTRAVENOUS | Status: AC
  Administered 2022-04-03: 1000 mg via INTRAVENOUS
  Filled 2022-04-03: qty 100

## 2022-04-03 MED ORDER — ONDANSETRON HCL 4 MG/2ML IJ SOLN
INTRAMUSCULAR | Status: DC | PRN
  Administered 2022-04-03: 4 mg via INTRAVENOUS

## 2022-04-03 MED ORDER — CHLORHEXIDINE GLUCONATE 0.12 % MT SOLN
OROMUCOSAL | Status: AC
  Administered 2022-04-03: 15 mL via OROMUCOSAL
  Filled 2022-04-03: qty 15

## 2022-04-03 MED ORDER — SODIUM CHLORIDE 0.9 % IV SOLN: INTRAVENOUS | Status: DC

## 2022-04-03 MED ORDER — LIDOCAINE 2% (20 MG/ML) 5 ML SYRINGE
INTRAMUSCULAR | Status: DC | PRN
  Administered 2022-04-03: 60 mg via INTRAVENOUS

## 2022-04-03 MED ORDER — PHENOL 1.4 % MT LIQD: 1.0000 | OROMUCOSAL | Status: DC | PRN

## 2022-04-03 MED ORDER — ASPIRIN 81 MG PO CHEW
81.0000 mg | CHEWABLE_TABLET | Freq: Two times a day (BID) | ORAL | Status: DC
  Administered 2022-04-03 – 2022-04-04 (×2): 81 mg via ORAL
  Filled 2022-04-03 (×2): qty 1

## 2022-04-03 MED ORDER — FENTANYL CITRATE (PF) 250 MCG/5ML IJ SOLN
INTRAMUSCULAR | Status: DC | PRN
  Administered 2022-04-03: 50 ug via INTRAVENOUS
  Administered 2022-04-03 (×2): 25 ug via INTRAVENOUS
  Administered 2022-04-03: 50 ug via INTRAVENOUS
  Administered 2022-04-03 (×2): 25 ug via INTRAVENOUS

## 2022-04-03 MED ORDER — EPHEDRINE SULFATE-NACL 50-0.9 MG/10ML-% IV SOSY
PREFILLED_SYRINGE | INTRAVENOUS | Status: DC | PRN
  Administered 2022-04-03 (×3): 5 mg via INTRAVENOUS

## 2022-04-03 MED ORDER — ROPIVACAINE HCL 5 MG/ML IJ SOLN
INTRAMUSCULAR | Status: DC | PRN
  Administered 2022-04-03: 20 mL via PERINEURAL

## 2022-04-03 MED ORDER — PROPOFOL 10 MG/ML IV BOLUS
INTRAVENOUS | Status: AC
  Filled 2022-04-03: qty 20

## 2022-04-03 MED ORDER — METHOCARBAMOL 1000 MG/10ML IJ SOLN: 500.0000 mg | Freq: Four times a day (QID) | INTRAVENOUS | Status: DC | PRN

## 2022-04-03 MED ORDER — DEXAMETHASONE SODIUM PHOSPHATE 10 MG/ML IJ SOLN
INTRAMUSCULAR | Status: DC | PRN
  Administered 2022-04-03: 10 mg via INTRAVENOUS

## 2022-04-03 MED ORDER — FENTANYL CITRATE (PF) 100 MCG/2ML IJ SOLN: 100.0000 ug | Freq: Once | INTRAMUSCULAR | Status: AC

## 2022-04-03 MED ORDER — ONDANSETRON HCL 4 MG/2ML IJ SOLN
INTRAMUSCULAR | Status: AC
  Filled 2022-04-03: qty 2

## 2022-04-03 MED ORDER — DOCUSATE SODIUM 100 MG PO CAPS
100.0000 mg | ORAL_CAPSULE | Freq: Two times a day (BID) | ORAL | Status: DC
  Administered 2022-04-03 – 2022-04-04 (×2): 100 mg via ORAL
  Filled 2022-04-03 (×2): qty 1

## 2022-04-03 MED ORDER — PROPOFOL 10 MG/ML IV BOLUS
INTRAVENOUS | Status: DC | PRN
  Administered 2022-04-03: 200 mg via INTRAVENOUS

## 2022-04-03 MED ORDER — LACTATED RINGERS IV SOLN: INTRAVENOUS | Status: DC

## 2022-04-03 MED ORDER — OXYCODONE HCL 5 MG PO TABS: 5.0000 mg | ORAL_TABLET | Freq: Once | ORAL | Status: DC | PRN

## 2022-04-03 MED ORDER — TRANEXAMIC ACID 1000 MG/10ML IV SOLN
INTRAVENOUS | Status: DC | PRN
  Administered 2022-04-03: 2000 mg via TOPICAL

## 2022-04-03 MED ORDER — VANCOMYCIN HCL 1000 MG IV SOLR
INTRAVENOUS | Status: DC | PRN
  Administered 2022-04-03: 1000 mg via TOPICAL

## 2022-04-03 MED ORDER — METOCLOPRAMIDE HCL 5 MG PO TABS: 5.0000 mg | ORAL_TABLET | Freq: Three times a day (TID) | ORAL | Status: DC | PRN

## 2022-04-03 MED ORDER — DEXAMETHASONE SODIUM PHOSPHATE 10 MG/ML IJ SOLN
INTRAMUSCULAR | Status: AC
  Filled 2022-04-03: qty 1

## 2022-04-03 MED ORDER — FENTANYL CITRATE (PF) 250 MCG/5ML IJ SOLN
INTRAMUSCULAR | Status: AC
  Filled 2022-04-03: qty 5

## 2022-04-03 MED ORDER — ACETAMINOPHEN 325 MG PO TABS: 325.0000 mg | ORAL_TABLET | Freq: Four times a day (QID) | ORAL | Status: DC | PRN

## 2022-04-03 MED ORDER — FERROUS SULFATE 325 (65 FE) MG PO TABS
325.0000 mg | ORAL_TABLET | Freq: Three times a day (TID) | ORAL | Status: DC
  Administered 2022-04-03 – 2022-04-04 (×2): 325 mg via ORAL
  Filled 2022-04-03 (×2): qty 1

## 2022-04-03 MED ORDER — CEFAZOLIN SODIUM-DEXTROSE 2-4 GM/100ML-% IV SOLN
2.0000 g | Freq: Four times a day (QID) | INTRAVENOUS | Status: AC
  Administered 2022-04-03 (×2): 2 g via INTRAVENOUS
  Filled 2022-04-03 (×2): qty 100

## 2022-04-03 MED ORDER — CEFADROXIL 500 MG PO CAPS
500.0000 mg | ORAL_CAPSULE | Freq: Two times a day (BID) | ORAL | Status: DC
  Administered 2022-04-04: 500 mg via ORAL
  Filled 2022-04-03: qty 1

## 2022-04-03 MED ORDER — FENTANYL CITRATE (PF) 100 MCG/2ML IJ SOLN
INTRAMUSCULAR | Status: AC
  Administered 2022-04-03: 100 ug via INTRAVENOUS
  Filled 2022-04-03: qty 2

## 2022-04-03 MED ORDER — MIDAZOLAM HCL 2 MG/2ML IJ SOLN: 2.0000 mg | Freq: Once | INTRAMUSCULAR | Status: AC

## 2022-04-03 MED ORDER — HYDROMORPHONE HCL 1 MG/ML IJ SOLN
INTRAMUSCULAR | Status: AC
  Filled 2022-04-03: qty 1

## 2022-04-03 MED ORDER — PROPOFOL 1000 MG/100ML IV EMUL
INTRAVENOUS | Status: AC
  Filled 2022-04-03: qty 100

## 2022-04-03 MED ORDER — GLYCOPYRROLATE PF 0.2 MG/ML IJ SOSY
PREFILLED_SYRINGE | INTRAMUSCULAR | Status: DC | PRN
  Administered 2022-04-03: .2 mg via INTRAVENOUS

## 2022-04-03 MED ORDER — CEFADROXIL 500 MG PO CAPS
500.0000 mg | ORAL_CAPSULE | Freq: Two times a day (BID) | ORAL | Status: DC
  Filled 2022-04-03: qty 1

## 2022-04-03 MED ORDER — DEXAMETHASONE SODIUM PHOSPHATE 10 MG/ML IJ SOLN: 10.0000 mg | Freq: Once | INTRAMUSCULAR | Status: DC

## 2022-04-03 MED ORDER — HYDROMORPHONE HCL 1 MG/ML IJ SOLN: 0.5000 mg | INTRAMUSCULAR | Status: DC | PRN

## 2022-04-03 MED ORDER — METOCLOPRAMIDE HCL 5 MG/ML IJ SOLN: 5.0000 mg | Freq: Three times a day (TID) | INTRAMUSCULAR | Status: DC | PRN

## 2022-04-03 MED ORDER — MENTHOL 3 MG MT LOZG
1.0000 | LOZENGE | OROMUCOSAL | Status: DC | PRN
  Filled 2022-04-03: qty 9

## 2022-04-03 MED ORDER — METHOCARBAMOL 500 MG PO TABS
500.0000 mg | ORAL_TABLET | Freq: Four times a day (QID) | ORAL | Status: DC | PRN
  Administered 2022-04-04: 500 mg via ORAL
  Filled 2022-04-03: qty 1

## 2022-04-03 MED ORDER — PHENYLEPHRINE 80 MCG/ML (10ML) SYRINGE FOR IV PUSH (FOR BLOOD PRESSURE SUPPORT)
PREFILLED_SYRINGE | INTRAVENOUS | Status: DC | PRN
  Administered 2022-04-03 (×4): 80 ug via INTRAVENOUS

## 2022-04-03 MED ORDER — POVIDONE-IODINE 10 % EX SWAB
2.0000 | Freq: Once | CUTANEOUS | Status: AC
  Administered 2022-04-03: 2 via TOPICAL

## 2022-04-03 MED ORDER — OXYCODONE HCL 5 MG/5ML PO SOLN: 5.0000 mg | Freq: Once | ORAL | Status: DC | PRN

## 2022-04-03 MED ORDER — CHLORHEXIDINE GLUCONATE 0.12 % MT SOLN
15.0000 mL | OROMUCOSAL | Status: AC
  Filled 2022-04-03: qty 15

## 2022-04-03 MED ORDER — HYDROMORPHONE HCL 1 MG/ML IJ SOLN
0.2500 mg | INTRAMUSCULAR | Status: DC | PRN
  Administered 2022-04-03 (×3): 0.5 mg via INTRAVENOUS

## 2022-04-03 MED ORDER — IRRISEPT - 450ML BOTTLE WITH 0.05% CHG IN STERILE WATER, USP 99.95% OPTIME
TOPICAL | Status: DC | PRN
  Administered 2022-04-03: 450 mL

## 2022-04-03 MED ORDER — BUPIVACAINE-MELOXICAM ER 400-12 MG/14ML IJ SOLN
INTRAMUSCULAR | Status: DC | PRN
  Administered 2022-04-03: 400 mg

## 2022-04-03 MED ORDER — ACETAMINOPHEN 500 MG PO TABS
1000.0000 mg | ORAL_TABLET | Freq: Four times a day (QID) | ORAL | Status: DC
  Administered 2022-04-03 – 2022-04-04 (×3): 1000 mg via ORAL
  Filled 2022-04-03 (×3): qty 2

## 2022-04-03 MED ORDER — ONDANSETRON HCL 4 MG PO TABS: 4.0000 mg | ORAL_TABLET | Freq: Four times a day (QID) | ORAL | Status: DC | PRN

## 2022-04-03 MED ORDER — SODIUM CHLORIDE 0.9 % IR SOLN
Status: DC | PRN
  Administered 2022-04-03: 1000 mL

## 2022-04-03 MED ORDER — 0.9 % SODIUM CHLORIDE (POUR BTL) OPTIME
TOPICAL | Status: DC | PRN
  Administered 2022-04-03: 1000 mL

## 2022-04-03 MED ORDER — OXYCODONE HCL 5 MG PO TABS
10.0000 mg | ORAL_TABLET | ORAL | Status: DC | PRN
  Administered 2022-04-03 – 2022-04-04 (×2): 10 mg via ORAL
  Filled 2022-04-03 (×2): qty 2

## 2022-04-03 MED ORDER — ALBUMIN HUMAN 5 % IV SOLN: INTRAVENOUS | Status: DC | PRN

## 2022-04-03 MED ORDER — BUPIVACAINE-MELOXICAM ER 400-12 MG/14ML IJ SOLN
INTRAMUSCULAR | Status: AC
  Filled 2022-04-03: qty 1

## 2022-04-03 SURGICAL SUPPLY — 87 items
ALCOHOL 70% 16 OZ (MISCELLANEOUS) ×1 IMPLANT
BAG COUNTER SPONGE SURGICOUNT (BAG) IMPLANT
BAG DECANTER FOR FLEXI CONT (MISCELLANEOUS) ×1 IMPLANT
BANDAGE ESMARK 6X9 LF (GAUZE/BANDAGES/DRESSINGS) IMPLANT
BLADE SAG 18X100X1.27 (BLADE) ×1 IMPLANT
BLADE SAW SGTL 73X25 THK (BLADE) ×1 IMPLANT
BNDG ESMARK 6X9 LF (GAUZE/BANDAGES/DRESSINGS) ×1
BOWL SMART MIX CTS (DISPOSABLE) ×1 IMPLANT
CLSR STERI-STRIP ANTIMIC 1/2X4 (GAUZE/BANDAGES/DRESSINGS) ×2 IMPLANT
COMP FEM CR PERS SZ 9 RT (Joint) ×1 IMPLANT
COMP PATELLAR 10X35 METAL (Joint) ×1 IMPLANT
COMPONENT FEM CR PERS SZ 9 RT (Joint) IMPLANT
COMPONENT PATELLAR 10X35 METAL (Joint) IMPLANT
COOLER ICEMAN CLASSIC (MISCELLANEOUS) ×1 IMPLANT
COVER MAYO STAND STRL (DRAPES) IMPLANT
COVER SURGICAL LIGHT HANDLE (MISCELLANEOUS) ×1 IMPLANT
CUFF TOURN SGL QUICK 34 (TOURNIQUET CUFF) ×1
CUFF TOURN SGL QUICK 42 (TOURNIQUET CUFF) IMPLANT
CUFF TRNQT CYL 34X4.125X (TOURNIQUET CUFF) ×1 IMPLANT
DERMABOND ADVANCED .7 DNX12 (GAUZE/BANDAGES/DRESSINGS) ×1 IMPLANT
DRAPE EXTREMITY T 121X128X90 (DISPOSABLE) ×1 IMPLANT
DRAPE HALF SHEET 40X57 (DRAPES) ×1 IMPLANT
DRAPE INCISE IOBAN 66X45 STRL (DRAPES) ×1 IMPLANT
DRAPE ORTHO SPLIT 77X108 STRL (DRAPES) ×2
DRAPE POUCH INSTRU U-SHP 10X18 (DRAPES) ×1 IMPLANT
DRAPE SURG ORHT 6 SPLT 77X108 (DRAPES) IMPLANT
DRAPE U-SHAPE 47X51 STRL (DRAPES) ×2 IMPLANT
DRSG AQUACEL AG ADV 3.5X10 (GAUZE/BANDAGES/DRESSINGS) ×1 IMPLANT
DURAPREP 26ML APPLICATOR (WOUND CARE) ×3 IMPLANT
ELECT CAUTERY BLADE 6.4 (BLADE) ×1 IMPLANT
ELECT PENCIL ROCKER SW 15FT (MISCELLANEOUS) ×1 IMPLANT
ELECT REM PT RETURN 9FT ADLT (ELECTROSURGICAL) ×1
ELECTRODE REM PT RTRN 9FT ADLT (ELECTROSURGICAL) ×1 IMPLANT
GLOVE BIO SURGEON STRL SZ7 (GLOVE) IMPLANT
GLOVE BIOGEL PI IND STRL 7.0 (GLOVE) ×2 IMPLANT
GLOVE BIOGEL PI IND STRL 7.5 (GLOVE) ×5 IMPLANT
GLOVE ECLIPSE 7.0 STRL STRAW (GLOVE) ×3 IMPLANT
GLOVE INDICATOR 7.0 STRL GRN (GLOVE) ×1 IMPLANT
GLOVE INDICATOR 7.5 STRL GRN (GLOVE) ×1 IMPLANT
GLOVE SURG SYN 7.5  E (GLOVE) ×3
GLOVE SURG SYN 7.5 E (GLOVE) ×3 IMPLANT
GLOVE SURG SYN 7.5 PF PI (GLOVE) ×2 IMPLANT
GLOVE SURG UNDER LTX SZ7.5 (GLOVE) ×2 IMPLANT
GLOVE SURG UNDER POLY LF SZ7 (GLOVE) ×2 IMPLANT
GOWN STRL REIN XL XLG (GOWN DISPOSABLE) ×1 IMPLANT
GOWN STRL REUS W/ TWL LRG LVL3 (GOWN DISPOSABLE) ×1 IMPLANT
GOWN STRL REUS W/TWL LRG LVL3 (GOWN DISPOSABLE) ×1
GOWN TOGA ZIPPER T7+ PEEL AWAY (MISCELLANEOUS) ×2 IMPLANT
HANDPIECE INTERPULSE COAX TIP (DISPOSABLE) ×1
HDLS TROCR DRIL PIN KNEE 75 (PIN) ×1
HOOD PEEL AWAY FLYTE STAYCOOL (MISCELLANEOUS) ×1 IMPLANT
INSERT TIB ARTI SZ8-11 RT 11 (Joint) IMPLANT
JET LAVAGE IRRISEPT WOUND (IRRIGATION / IRRIGATOR) ×1
KIT BASIN OR (CUSTOM PROCEDURE TRAY) ×1 IMPLANT
KIT TURNOVER KIT B (KITS) ×1 IMPLANT
LAVAGE JET IRRISEPT WOUND (IRRIGATION / IRRIGATOR) IMPLANT
MANIFOLD NEPTUNE II (INSTRUMENTS) ×1 IMPLANT
MARKER SKIN DUAL TIP RULER LAB (MISCELLANEOUS) ×2 IMPLANT
NDL SPNL 18GX3.5 QUINCKE PK (NEEDLE) ×1 IMPLANT
NEEDLE SPNL 18GX3.5 QUINCKE PK (NEEDLE) ×1 IMPLANT
NS IRRIG 1000ML POUR BTL (IV SOLUTION) ×1 IMPLANT
PACK TOTAL JOINT (CUSTOM PROCEDURE TRAY) ×1 IMPLANT
PAD ARMBOARD 7.5X6 YLW CONV (MISCELLANEOUS) ×2 IMPLANT
PAD COLD SHLDR WRAP-ON (PAD) ×1 IMPLANT
PIN DRILL HDLS TROCAR 75 4PK (PIN) IMPLANT
SAW OSC TIP CART 19.5X105X1.3 (SAW) ×1 IMPLANT
SCREW FEMALE HEX FIX 25X2.5 (ORTHOPEDIC DISPOSABLE SUPPLIES) IMPLANT
SET HNDPC FAN SPRY TIP SCT (DISPOSABLE) ×1 IMPLANT
SOLUTION PRONTOSAN WOUND 350ML (IRRIGATION / IRRIGATOR) ×1 IMPLANT
STAPLER VISISTAT 35W (STAPLE) IMPLANT
STEM TIBIAL TRAB SZF RT (Stem) IMPLANT
SUCTION FRAZIER HANDLE 10FR (MISCELLANEOUS) ×1
SUCTION TUBE FRAZIER 10FR DISP (MISCELLANEOUS) ×1 IMPLANT
SUT ETHILON 2 0 FS 18 (SUTURE) IMPLANT
SUT MNCRL AB 3-0 PS2 27 (SUTURE) IMPLANT
SUT VIC AB 0 CT1 27 (SUTURE) ×2
SUT VIC AB 0 CT1 27XBRD ANBCTR (SUTURE) ×2 IMPLANT
SUT VIC AB 1 CTX 27 (SUTURE) ×3 IMPLANT
SUT VIC AB 2-0 CT1 27 (SUTURE) ×4
SUT VIC AB 2-0 CT1 TAPERPNT 27 (SUTURE) ×4 IMPLANT
SYR 50ML LL SCALE MARK (SYRINGE) ×2 IMPLANT
TOWEL GREEN STERILE (TOWEL DISPOSABLE) ×1 IMPLANT
TOWEL GREEN STERILE FF (TOWEL DISPOSABLE) ×1 IMPLANT
TRAY CATH 16FR W/PLASTIC CATH (SET/KITS/TRAYS/PACK) IMPLANT
TUBE SUCT ARGYLE STRL (TUBING) ×1 IMPLANT
UNDERPAD 30X36 HEAVY ABSORB (UNDERPADS AND DIAPERS) ×1 IMPLANT
YANKAUER SUCT BULB TIP NO VENT (SUCTIONS) ×2 IMPLANT

## 2022-04-03 NOTE — Anesthesia Preprocedure Evaluation (Addendum)
Anesthesia Evaluation  Patient identified by MRN, date of birth, ID band Patient awake    Reviewed: Allergy & Precautions, NPO status , Patient's Chart, lab work & pertinent test results  History of Anesthesia Complications Negative for: history of anesthetic complications  Airway Mallampati: II  TM Distance: >3 FB Neck ROM: Full    Dental  (+) Dental Advisory Given   Pulmonary sleep apnea and Continuous Positive Airway Pressure Ventilation  05/08/2021 SARS coronavirus NEG   breath sounds clear to auscultation       Cardiovascular (-) angina negative cardio ROS  Rhythm:Regular Rate:Normal     Neuro/Psych Seizures -, Well Controlled,   negative psych ROS   GI/Hepatic negative GI ROS, Neg liver ROS,neg GERD  ,,  Endo/Other    Renal/GU negative Renal ROS     Musculoskeletal  (+) Arthritis , Osteoarthritis,    Abdominal  (+) + obese  Peds  Hematology negative hematology ROS (+)   Anesthesia Other Findings   Reproductive/Obstetrics                             Anesthesia Physical Anesthesia Plan  ASA: 3  Anesthesia Plan: Regional and General   Post-op Pain Management: Regional block* and Minimal or no pain anticipated   Induction: Intravenous  PONV Risk Score and Plan: 1 and Ondansetron and Treatment may vary due to age or medical condition  Airway Management Planned: LMA  Additional Equipment: None  Intra-op Plan:   Post-operative Plan: Extubation in OR  Informed Consent: I have reviewed the patients History and Physical, chart, labs and discussed the procedure including the risks, benefits and alternatives for the proposed anesthesia with the patient or authorized representative who has indicated his/her understanding and acceptance.     Dental advisory given  Plan Discussed with: CRNA and Surgeon  Anesthesia Plan Comments:         Anesthesia Quick Evaluation

## 2022-04-03 NOTE — Transfer of Care (Signed)
Immediate Anesthesia Transfer of Care Note  Patient: Ricky Barton  Procedure(s) Performed: RIGHT TOTAL KNEE ARTHROPLASTY (Right: Knee)  Patient Location: PACU  Anesthesia Type:GA combined with regional for post-op pain  Level of Consciousness: awake, alert , patient cooperative, and responds to stimulation  Airway & Oxygen Therapy: Patient Spontanous Breathing and Patient connected to face mask oxygen  Post-op Assessment: Report given to RN and Post -op Vital signs reviewed and stable  Post vital signs: Reviewed and stable  Last Vitals:  Vitals Value Taken Time  BP 132/80 04/03/22 1351  Temp    Pulse 105 04/03/22 1353  Resp 21 04/03/22 1353  SpO2 95 % 04/03/22 1353  Vitals shown include unvalidated device data.  Last Pain:  Vitals:   04/03/22 1105  TempSrc:   PainSc: 0-No pain         Complications: No notable events documented.

## 2022-04-03 NOTE — Anesthesia Procedure Notes (Signed)
Anesthesia Regional Block: Adductor canal block   Pre-Anesthetic Checklist: , timeout performed,  Correct Patient, Correct Site, Correct Laterality,  Correct Procedure, Correct Position, site marked,  Risks and benefits discussed,  Surgical consent,  Pre-op evaluation,  At surgeon's request and post-op pain management  Laterality: Right  Prep: chloraprep       Needles:  Injection technique: Single-shot  Needle Type: Stimiplex     Needle Length: 9cm  Needle Gauge: 21     Additional Needles:   Procedures:,,,, ultrasound used (permanent image in chart),,    Narrative:  Start time: 04/03/2022 11:02 AM End time: 04/03/2022 11:07 AM Injection made incrementally with aspirations every 5 mL.  Performed by: Personally  Anesthesiologist: Lynda Rainwater, MD

## 2022-04-03 NOTE — Care Plan (Signed)
Ortho Bundle Case Management Note  Patient Details  Name: Ricky Barton MRN: [id number] Date of Birth: Mar 23, 1955  [facility] RNCM call to patient prior to his Right total knee arthroplasty with Dr. Erlinda Hong on 04/03/12. He is an Ortho bundle patient through [facility] and is agreeable to case management. He lives with his spouse, who will be assisting after surgery. He has a RW and has received his home CPM through [place]. Anticipate HHPT will be needed after a short hospital stay. Referral made to [facility] after choice provided.Reviewed all post op care instructions. Will continue to follow for needs.                  DME Arranged:   (Patient has all DME from previous knee replacement; CPM delivered by [employer] prior to surgery) DME Agency:  [employer]  HH Arranged:  PT HH Agency:  [facility]  Additional Comments: Please contact me with any questions of if this plan should need to change.  Jamse Arn, RN, BSN, [employer]  [phone] 04/03/2022, 3:26 PM

## 2022-04-03 NOTE — Anesthesia Procedure Notes (Addendum)
Procedure Name: LMA Insertion Date/Time: 04/03/2022 11:47 AM  Performed by: Michele Rockers, CRNAPre-anesthesia Checklist: Patient identified, Emergency Drugs available, Suction available and Patient being monitored Patient Re-evaluated:Patient Re-evaluated prior to induction Oxygen Delivery Method: Circle system utilized Preoxygenation: Pre-oxygenation with 100% oxygen Induction Type: IV induction Ventilation: Oral airway inserted - appropriate to patient size and Two handed mask ventilation required LMA: LMA flexible inserted LMA Size: 5.0 Number of attempts: 1 Placement Confirmation: positive ETCO2 and breath sounds checked- equal and bilateral Tube secured with: Tape Dental Injury: Teeth and Oropharynx as per pre-operative assessment

## 2022-04-03 NOTE — Op Note (Addendum)
Total Knee Arthroplasty Procedure Note  Preoperative diagnosis: Right knee osteoarthritis  Postoperative diagnosis:same  Operative findings: Severe tricompartmental osteoarthritis  Operative procedure:  Right total knee arthroplasty. CPT [phone] Interpretation of portable knee x-ray  Surgeon: N. Eduard Roux, MD  Assist: Madalyn Rob, PA-C; necessary for the timely completion of procedure and due to complexity of procedure.  Anesthesia: Spinal, regional  Tourniquet time: see anesthesia record  Implants used: Zimmer persona uncemented Femur: CR 9 Tibia: F two peg Patella: 35 mm Polyethylene: 11 mm, MC  Indication: Ricky Barton is a 67 y.o. year old male with a history of knee pain. Having failed conservative management, the patient elected to proceed with a total knee arthroplasty.  We have reviewed the risk and benefits of the surgery and they elected to proceed after voicing understanding.  Procedure:  After informed consent was obtained and understanding of the risk were voiced including but not limited to bleeding, infection, damage to surrounding structures including nerves and vessels, blood clots, leg length inequality and the failure to achieve desired results, the operative extremity was marked with verbal confirmation of the patient in the holding area.   The patient was then brought to the operating room and transported to the operating room table in the supine position.  A tourniquet was applied to the operative extremity around the upper thigh. The operative limb was then prepped and draped in the usual sterile fashion and preoperative antibiotics were administered.  A time out was performed prior to the start of surgery confirming the correct extremity, preoperative antibiotic administration, as well as team members, implants and instruments available for the case. Correct surgical site was also confirmed with preoperative radiographs. The limb was then  elevated for exsanguination and the tourniquet was inflated. A midline incision was made and a standard medial parapatellar approach was performed.  The patella was prepared and sized to a 35 mm.  I noticed that the patella protector was missing a tine so a portable film was obtained to confirm that there was tine in the knee joint.  We decided that the tine was already missing from the protector.  A new patella protector was used.  We then turned our attention to the femur. Posterior cruciate ligament was sacrificed. Start site was drilled in the femur and the intramedullary distal femoral cutting guide was placed, set at 5 degrees valgus, taking 10 mm of distal resection. The distal cut was made. Osteophytes were then removed.   Next, the proximal tibial cutting guide was placed with appropriate slope, varus/valgus alignment and depth of resection. The proximal tibial cut was made taking 2 mm off the low side. Gap blocks were then used to assess the extension gap and alignment, and appropriate soft tissue releases were performed. Attention was turned back to the femur, which was sized using the sizing guide to a size 9. Appropriate rotation of the femoral component was determined using epicondylar axis, Whiteside's line, and assessing the flexion gap under ligament tension. The appropriate size 4-in-1 cutting block was placed and cuts were made.  Posterior femoral osteophytes and uncapped bone were then removed with the curved osteotome.  Trial components were placed, and stability was checked in full extension, mid-flexion, and deep flexion. Proper tibial rotation was determined and marked.  The patella tracked well without a lateral release. Trial components were then removed and tibial preparation performed.  The tibia was sized for a size F component.  The bony surfaces were irrigated with a  pulse lavage and then dried. Final components were placed.  The stability of the construct was re-evaluated  throughout a range of motion and found to be acceptable. The trial liner was removed, the knee was copiously irrigated, and the knee was re-evaluated for any excess bone debris. The real polyethylene liner, 11 mm thick, was inserted and checked to ensure the locking mechanism had engaged appropriately. The tourniquet was deflated and hemostasis was achieved. The wound was irrigated with normal saline.  One gram of vancomycin powder was placed in the surgical bed.  Topical 0.25% bupivacaine and meloxicam was placed in the joint for postoperative pain.  Capsular closure was performed with a #1 vicryl, subcutaneous fat closed with a 0 vicryl suture, then subcutaneous tissue closed with interrupted 2.0 vicryl suture. The skin was then closed with a 2.0 nylon and dermabond. A sterile dressing was applied.  The patient was awakened in the operating room and taken to recovery in stable condition. All sponge, needle, and instrument counts were correct at the end of the case.  [name] was necessary for opening, closing, retracting, limb positioning and overall facilitation and completion of the surgery.  Position: supine  Complications: none.  Time Out: performed   Drains/Packing: none  Estimated blood loss: minimal  Returned to Recovery Room: in good condition.   Antibiotics: yes   Mechanical VTE (DVT) Prophylaxis: sequential compression devices, TED thigh-high  Chemical VTE (DVT) Prophylaxis: aspirin  Fluid Replacement  Crystalloid: see anesthesia record Blood: none  FFP: none   Specimens Removed: 1 to pathology   Sponge and Instrument Count Correct? yes   PACU: portable radiograph - knee AP and Lateral   Plan/RTC: Return in 2 weeks for wound check.   Weight Bearing/Load Lower Extremity: full   Implant Name Type Inv. Item Serial No. Manufacturer Lot No. LRB No. Used Action  COMP FEM CR PERS SZ 9 RT - [id number] Joint COMP FEM CR PERS SZ 9 RT  ZIMMER RECON(ORTH,TRAU,BIO,SG)  Right  1 Implanted  STEM TIBIAL TRAB SZF RT - [id number] Stem STEM TIBIAL TRAB SZF RT  ZIMMER RECON(ORTH,TRAU,BIO,SG)  Right 1 Implanted  INSERT TIB ARTI SZ8-11 RT 11 - [id number] Joint INSERT TIB ARTI SZ8-11 RT 11  ZIMMER RECON(ORTH,TRAU,BIO,SG)  Right 1 Implanted  COMP PATELLAR 10X35 METAL - [id number] Joint COMP PATELLAR 10X35 METAL  ZIMMER RECON(ORTH,TRAU,BIO,SG)  Right 1 Implanted    N. Eduard Roux, MD [facility] 1:14 PM

## 2022-04-03 NOTE — H&P (Signed)
PREOPERATIVE H&P  Chief Complaint: RIGHT KNEE OSTEOARTHRITIS  HPI: Ricky Barton is a 67 y.o. male who presents for surgical treatment of RIGHT KNEE OSTEOARTHRITIS.  He denies any changes in medical history.  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   COLONOSCOPY  07/2016   WNL, rpt 5 yrs Fuller Plan)   COLONOSCOPY  11/2021   2 HPs, rpt 7 yrs Fuller Plan)   Head MRI  08/2000   POLYPECTOMY  2013   TA"s x 3   TOTAL KNEE ARTHROPLASTY Left 05/12/2021   Procedure: LEFT TOTAL KNEE ARTHROPLASTY;  Surgeon: Leandrew Koyanagi, MD;  Location: [facility];  Service: Orthopedics;  Laterality: Left;   Social History   Socioeconomic History   Marital status: Married    Spouse name: Not on file   Number of children: 2   Years of education: Not on file   Highest education level: Not on file  Occupational History   Occupation: Route Social research officer, government: [employer]    Comment: [employer]  Tobacco Use   Smoking status: Never   Smokeless tobacco: Never  Vaping Use   Vaping Use: Never used  Substance and Sexual Activity   Alcohol use: No   Drug use: No   Sexual activity: Yes  Other Topics Concern   Not on file  Social History Narrative   Caffeine: occasional coffee/tea   Married and lives with wife, grown children, 2 dogs   Occupation: [employer]   Activity: no regular exercise, some golfing   Diet: good water, fruits/vegetables daily   Social Determinants of Health   Financial Resource Strain: Low Risk  (01/05/2022)   Overall  Financial Resource Strain (CARDIA)    Difficulty of Paying Living Expenses: Not hard at all  Food Insecurity: No Food Insecurity (01/05/2022)   Hunger Vital Sign    Worried About Running Out of Food in the Last Year: Never true    [place] in the Last Year: Never true  Transportation Needs: No Transportation Needs (01/05/2022)   PRAPARE - Hydrologist (Medical): No    Lack of Transportation (Non-Medical): No  Physical Activity: Insufficiently Active (01/05/2022)   Exercise Vital Sign    Days of Exercise per Week: 3 days    Minutes of Exercise per Session: 30 min  Stress: No Stress Concern Present (01/05/2022)   [place]    Feeling of Stress : Not at all  Social Connections: [place] (01/05/2022)   Social Connection and Isolation Panel [NHANES]    Frequency of Communication with Friends and Family: More than three times a week    Frequency of Social Gatherings with Friends and Family: Twice a week    Attends Religious Services: More than 4 times per year    Active Member of [employer] or Organizations: Yes    Attends Archivist  Meetings: More than 4 times per year    Marital Status: Married   Family History  Problem Relation Age of Onset   Stomach cancer Mother    Hypertension Mother    Colon polyps Mother    Cancer Father 5       stomach cancer   Hyperlipidemia Brother    CAD Brother 67       MI (smoker)   Hyperlipidemia Brother    Sleep apnea Brother    Pancreatic cancer Brother 46   Hyperlipidemia Brother    CAD Brother 62       MI (smoker)   Hyperlipidemia Brother    Multiple sclerosis Brother    Colon cancer Neg Hx    Esophageal cancer Neg Hx    Rectal cancer Neg Hx    Allergies  Allergen Reactions   Piroxicam Rash    blisters   Prior to Admission medications   Medication Sig Start Date End Date Taking? Authorizing Provider  aspirin EC 81  MG tablet Take 1 tablet (81 mg total) by mouth 2 (two) times daily. To be taken after surgery to prevent blood clots Patient taking differently: Take 81 mg by mouth every Monday, Wednesday, and Friday. 05/05/21  Yes Aundra Dubin, PA-C  cetirizine (ZYRTEC) 10 MG tablet TAKE 1 TABLET BY MOUTH EVERY DAY Patient taking differently: Take 10 mg by mouth daily as needed for allergies. 07/11/20  Yes Flora Lipps, MD  cyanocobalamin 1000 MCG tablet Take 1 tablet (1,000 mcg total) by mouth every Monday, Wednesday, and Friday. Vit b12 02/11/22  Yes Ria Bush, MD  Glucosamine-Chondroitin (OSTEO BI-FLEX REGULAR STRENGTH PO) Take 2 tablets by mouth daily.   Yes [provider]  ibuprofen (ADVIL) 200 MG tablet Take 400 mg by mouth every 6 (six) hours as needed for moderate pain.   Yes [provider]  Omega-3 Fatty Acids (FISH OIL) 1000 MG CAPS Take 1 capsule (1,000 mg total) by mouth daily. 02/11/22  Yes Ria Bush, MD  pravastatin (PRAVACHOL) 20 MG tablet Take 1 tablet (20 mg total) by mouth daily. 02/11/22  Yes Ria Bush, MD  sildenafil (VIAGRA) 100 MG tablet TAKE 1/2 TO 1 TABLET BY MOUTH 1 HOUR PRIOR AS NEEDED 04/05/14  Yes Ria Bush, MD  sodium chloride (OCEAN) 0.65 % SOLN nasal spray Place 1 spray into both nostrils at bedtime.   Yes [provider]  amoxicillin (AMOXIL) 500 MG capsule Take four pills one hour prior to dental work 03/27/22   Aundra Dubin, PA-C  aspirin EC 81 MG tablet Take 1 tablet (81 mg total) by mouth 2 (two) times daily. To be taken after surgery to prevent blood clots 03/27/22 03/27/23  Aundra Dubin, PA-C  cefadroxil (DURICEF) 500 MG capsule Take 1 capsule (500 mg total) by mouth 2 (two) times daily. To be taken after surgery 03/27/22   Aundra Dubin, PA-C  docusate sodium (COLACE) 100 MG capsule Take 1 capsule (100 mg total) by mouth daily as needed. 03/27/22 03/27/23  Aundra Dubin, PA-C  methocarbamol (ROBAXIN-750) 750  MG tablet Take 1 tablet (750 mg total) by mouth 2 (two) times daily as needed for muscle spasms. 03/27/22   Aundra Dubin, PA-C  ondansetron (ZOFRAN) 4 MG tablet Take 1 tablet (4 mg total) by mouth every 8 (eight) hours as needed for nausea or vomiting. 03/27/22   Aundra Dubin, PA-C  oxyCODONE-acetaminophen (PERCOCET) 5-325 MG tablet Take 1-2 tablets by mouth every 6 (six) hours  as needed. To be taken after surgery 03/27/22   Aundra Dubin, PA-C     Positive ROS: All other systems have been reviewed and were otherwise negative with the exception of those mentioned in the HPI and as above.  Physical Exam: General: Alert, no acute distress Cardiovascular: No pedal edema Respiratory: No cyanosis, no use of accessory musculature GI: abdomen soft Skin: No lesions in the area of chief complaint Neurologic: Sensation intact distally Psychiatric: Patient is competent for consent with normal mood and affect Lymphatic: no lymphedema  MUSCULOSKELETAL: exam stable  Assessment: RIGHT KNEE OSTEOARTHRITIS  Plan: Plan for Procedure(s): RIGHT TOTAL KNEE ARTHROPLASTY  The risks benefits and alternatives were discussed with the patient including but not limited to the risks of nonoperative treatment, versus surgical intervention including infection, bleeding, nerve injury,  blood clots, cardiopulmonary complications, morbidity, mortality, among others, and they were willing to proceed.   Eduard Roux, MD 04/03/2022 9:55 AM

## 2022-04-03 NOTE — Evaluation (Signed)
Physical Therapy Evaluation Patient Details Name: Ricky Barton MRN: [id number] DOB: 1956-02-03 Today's Date: 04/03/2022  History of Present Illness  Pt is 67 yo male s/p R TKA on 04/03/22 .  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 At baseline, pt is independent.  He had L TKA ~ 1 year ago and did well.  Pt has DME and home support.  Today, he was min guard for OOB transfers and to ambulate 76' with RW.  Pt with good quad activation, ROM , and pain control.  Pt expected to progress very well.  Pt will benefit from skilled PT to increase their independence and safety with mobility to allow discharge to the venue listed below.         Recommendations for follow up therapy are one component of a multi-disciplinary discharge planning process, led by the attending physician.  Recommendations may be updated based on patient status, additional functional criteria and insurance authorization.  Follow Up Recommendations Follow physician's recommendations for discharge plan and follow up therapies      Assistance Recommended at Discharge Intermittent Supervision/Assistance  Patient can return home with the following  A little help with walking and/or transfers;A little help with bathing/dressing/bathroom;Assistance with cooking/housework;Help with stairs or ramp for entrance    Equipment Recommendations None recommended by PT  Recommendations for Other Services       Functional Status Assessment Patient has had a recent decline in their functional status and demonstrates the ability to make significant improvements in function in a reasonable and predictable amount of time.     Precautions / Restrictions Precautions Precautions: Fall Restrictions Weight Bearing Restrictions: Yes RLE Weight Bearing: Weight bearing as tolerated       Mobility  Bed Mobility Overal bed mobility: Needs Assistance Bed Mobility: Supine to Sit     Supine to sit: Supervision          Transfers Overall transfer level: Needs assistance Equipment used: Rolling walker (2 wheels) Transfers: Sit to/from Stand Sit to Stand: Min guard           General transfer comment: cues for hand placement    Ambulation/Gait Ambulation/Gait assistance: Min guard Gait Distance (Feet): 70 Feet Assistive device: Rolling walker (2 wheels) Gait Pattern/deviations: Step-to pattern, Decreased stride length, Decreased weight shift to right Gait velocity: decreased     General Gait Details: cues for RW proximity (tending to get too close) but otherwise stable  Stairs            Wheelchair Mobility    Modified Rankin (Stroke Patients Only)       Balance Overall balance assessment: Needs assistance Sitting-balance support: No upper extremity supported Sitting balance-Leahy Scale: Good     Standing balance support: Bilateral upper extremity supported, No upper extremity supported Standing balance-Leahy Scale: Fair Standing balance comment: RW to ambulate but could stand without support                             Pertinent Vitals/Pain Pain Assessment Pain Assessment: 0-10 Pain Score: 2  Pain Location: R thigh Pain Descriptors / Indicators: Discomfort Pain Intervention(s): Limited activity within patient's tolerance, Monitored during session, Repositioned, Ice applied    Home Living Family/patient expects to be discharged to:: Private residence Living Arrangements: Spouse/significant other Available Help  at Discharge: Family;Available 24 hours/day Type of Home: House Home Access: Stairs to enter Entrance Stairs-Rails: None Entrance Stairs-Number of Steps: 2   Home Layout: One level Home Equipment: Conservation officer, nature (2 wheels);Shower seat - built in;Hand held shower head;Adaptive equipment      Prior  Function Prior Level of Function : Independent/Modified Independent;Driving             Mobility Comments: Could ambulate in community; enjoys playing golf ADLs Comments: Independent ADLS and IADLs     Hand Dominance   Dominant Hand: Right    Extremity/Trunk Assessment   Upper Extremity Assessment Upper Extremity Assessment: Overall WFL for tasks assessed    Lower Extremity Assessment Lower Extremity Assessment: LLE deficits/detail;RLE deficits/detail RLE Deficits / Details: ROM: knee ~0 to 80 degrees; MMT: ankle 5/5, knee and hip 3/5 not further tested RLE Sensation: WNL LLE Deficits / Details: ROM WFL; MMT 5/5    Cervical / Trunk Assessment Cervical / Trunk Assessment: Normal  Communication   Communication: No difficulties  Cognition Arousal/Alertness: Awake/alert Behavior During Therapy: WFL for tasks assessed/performed Overall Cognitive Status: Within Functional Limits for tasks assessed                                          General Comments General comments (skin integrity, edema, etc.): VSS    Exercises     Assessment/Plan    PT Assessment Patient needs continued PT services  PT Problem List Decreased strength;Pain;Decreased range of motion;Decreased activity tolerance;Decreased knowledge of use of DME;Decreased balance;Decreased mobility;Decreased knowledge of precautions       PT Treatment Interventions Therapeutic exercise;DME instruction;Gait training;Stair training;Functional mobility training;Therapeutic activities;Patient/family education;Modalities;Balance training    PT Goals (Current goals can be found in the Care Plan section)  Acute Rehab PT Goals Patient Stated Goal: return home PT Goal Formulation: With patient/family Time For Goal Achievement: 04/17/22 Potential to Achieve Goals: Good    Frequency 7X/week     Co-evaluation               AM-PAC PT "6 Clicks" Mobility  Outcome Measure Help needed turning  from your back to your side while in a flat bed without using bedrails?: None Help needed moving from lying on your back to sitting on the side of a flat bed without using bedrails?: None Help needed moving to and from a bed to a chair (including a wheelchair)?: A Little Help needed standing up from a chair using your arms (e.g., wheelchair or bedside chair)?: A Little Help needed to walk in hospital room?: A Little Help needed climbing 3-5 steps with a railing? : A Little 6 Click Score: 20    End of Session Equipment Utilized During Treatment: Gait belt Activity Tolerance: Patient tolerated treatment well Patient left: with call bell/phone within reach;in chair;with family/visitor present (3c, no alarms, follows commands, knows to call) Nurse Communication: Mobility status PT Visit Diagnosis: Other abnormalities of gait and mobility (R26.89);Muscle weakness (generalized) (M62.81)    Time: 0272-5366 PT Time Calculation (min) (ACUTE ONLY): 25 min   Charges:   PT Evaluation $PT Eval Low Complexity: 1 Low PT Treatments $Gait Training: 8-22 mins        Abran Richard, PT Acute Rehab [facility] [employer] 04/03/2022, 5:37 PM

## 2022-04-03 NOTE — Anesthesia Postprocedure Evaluation (Signed)
Anesthesia Post Note  Patient: RHYSE LOUX  Procedure(s) Performed: RIGHT TOTAL KNEE ARTHROPLASTY (Right: Knee)     Patient location during evaluation: PACU Anesthesia Type: Regional and General Level of consciousness: awake and alert Pain management: pain level controlled Vital Signs Assessment: post-procedure vital signs reviewed and stable Respiratory status: spontaneous breathing, nonlabored ventilation and respiratory function stable Cardiovascular status: blood pressure returned to baseline and stable Postop Assessment: no apparent nausea or vomiting Anesthetic complications: no   No notable events documented.  Last Vitals:  Vitals:   04/03/22 1445 04/03/22 1502  BP: 131/85 (!) 140/82  Pulse: 97 93  Resp: 19 18  Temp: 36.5 C 36.5 C  SpO2: 96% 97%    Last Pain:  Vitals:   04/03/22 1502  TempSrc: Oral  PainSc:                  Lynda Rainwater

## 2022-04-04 DIAGNOSIS — M1711 Unilateral primary osteoarthritis, right knee: Secondary | ICD-10-CM | POA: Diagnosis not present

## 2022-04-04 LAB — CBC
HCT: 40.1 % (ref 39.0–52.0)
Hemoglobin: 13.2 g/dL (ref 13.0–17.0)
MCH: 30.1 pg (ref 26.0–34.0)
MCHC: 32.9 g/dL (ref 30.0–36.0)
MCV: 91.3 fL (ref 80.0–100.0)
Platelets: 296 10*3/uL (ref 150–400)
RBC: 4.39 MIL/uL (ref 4.22–5.81)
RDW: 12.8 % (ref 11.5–15.5)
WBC: 17.9 10*3/uL — ABNORMAL HIGH (ref 4.0–10.5)
nRBC: 0 % (ref 0.0–0.2)

## 2022-04-04 MED ORDER — DEXAMETHASONE SODIUM PHOSPHATE 10 MG/ML IJ SOLN
10.0000 mg | Freq: Once | INTRAMUSCULAR | Status: AC
  Administered 2022-04-04: 10 mg via INTRAVENOUS
  Filled 2022-04-04: qty 1

## 2022-04-04 NOTE — Progress Notes (Signed)
   Subjective:  Patient reports pain as mild.  Walked 70 ft yesterday with PT  Objective:   VITALS:   Vitals:   04/03/22 1937 04/03/22 2314 04/04/22 0345 04/04/22 0722  BP: 128/84 130/75 115/71 135/80  Pulse: (!) 103 (!) 102 96 93  Resp: '18 20 18 16  '$ Temp: (!) 97.5 F (36.4 C) 98.8 F (37.1 C) 98.9 F (37.2 C) 98.1 F (36.7 C)  TempSrc: Oral Oral Oral Oral  SpO2: 93% 96% 95% 95%  Weight:      Height:        Neurovascular intact Sensation intact distally Intact pulses distally Dorsiflexion/Plantar flexion intact   Lab Results  Component Value Date   WBC 9.1 03/27/2022   HGB 14.7 03/27/2022   HCT 45.1 03/27/2022   MCV 92.0 03/27/2022   PLT 306 03/27/2022     Assessment/Plan:  1 Day Post-Op   - Expected postop acute blood loss anemia - Up with PT/OT - DVT ppx - SCDs, ambulation, aspirin - WBAT operative extremity - Pain control - Discharge planning - home today after morning PT session  Eduard Roux 04/04/2022, 8:50 AM

## 2022-04-04 NOTE — TOC Transition Note (Signed)
Transition of Care [facility]) - CM/SW Discharge Note   Patient Details  Name: Ricky Barton MRN: [id number] Date of Birth: [birth year]  [facility] CM/SW Contact:  Carles Collet, RN Phone Number: 04/04/2022, 8:14 AM   Clinical Narrative:      Patient with order to DC to home today. Patient is an ortho bundle and DME and HH has been arranged through the outpatient office prior to admission.   HH- [facility], added to AVS  Patient will have family/ friends provide transportation home. No other TOC needs identified for DC       Patient Goals and CMS Choice      Discharge Placement                         Discharge Plan and Services Additional resources added to the After Visit Summary for                  DME Arranged:  (Patient has all DME from previous knee replacement; CPM delivered by [employer] prior to surgery) DME Agency: [employer]       HH Arranged: PT [place] Agency: [place]        Social Determinants of Health (SDOH) Interventions SDOH Screenings   Food Insecurity: No Food Insecurity (01/05/2022)  Housing: Low Risk  (01/05/2022)  Transportation Needs: No Transportation Needs (01/05/2022)  Utilities: Not At Risk (01/05/2022)  Depression (PHQ2-9): Low Risk  (02/11/2022)  Financial Resource Strain: Low Risk  (01/05/2022)  Physical Activity: Insufficiently Active (01/05/2022)  Social Connections: Socially Integrated (01/05/2022)  Stress: No Stress Concern Present (01/05/2022)  Tobacco Use: Low Risk  (04/03/2022)     Readmission Risk Interventions     No data to display

## 2022-04-04 NOTE — Discharge Instructions (Signed)

## 2022-04-04 NOTE — Evaluation (Signed)
Occupational Therapy Evaluation Patient Details Name: Ricky Barton MRN: [id number] DOB: Apr 18, 1955 Today's Date: 04/04/2022   History of Present Illness Pt is 67 yo male s/p R TKA on 04/03/22 .  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 Pt demonstrated understanding. Wife present for education. No further OT needs.      Recommendations for follow up therapy are one component of a multi-disciplinary discharge planning process, led by the attending physician.  Recommendations may be updated based on patient status, additional functional criteria and insurance authorization.   Follow Up Recommendations  No OT follow up     Assistance Recommended at Discharge Intermittent Supervision/Assistance  Patient can return home with the following Assistance with cooking/housework;Assist for transportation    Functional Status Assessment  Patient has had a recent decline in their functional status and demonstrates the ability to make significant improvements in function in a reasonable and predictable amount of time.  Equipment Recommendations  None recommended by OT    Recommendations for Other Services       Precautions / Restrictions Precautions Precautions: Fall Restrictions Weight Bearing Restrictions: No RLE Weight Bearing: Weight bearing as tolerated      Mobility Bed Mobility               General bed mobility comments: sitting EOB    Transfers Overall transfer level: Modified independent Equipment used: Rolling walker (2 wheels) Transfers: Sit to/from Stand Sit to Stand: Modified independent (Device/Increase time)           General transfer comment: Good technique to rise and lower from bed. No physical assist  needed, getting good flexion through RLE, tolerating fair amount of weight on RLE.      Balance Overall balance assessment: Needs assistance Sitting-balance support: No upper extremity supported Sitting balance-Leahy Scale: Good     Standing balance support: No upper extremity supported, During functional activity Standing balance-Leahy Scale: Fair Standing balance comment: RW to ambulate but could stand without support                           ADL either performed or assessed with clinical judgement   ADL Overall ADL's : Needs assistance/impaired                                     Functional mobility during ADLs: Modified independent General ADL Comments: educated on use of AE to increased independence with LB ADL using a reacher, sock aid and long handled sponge; reviewed strategies to reduce risk of falls     Vision         Perception     Praxis      Pertinent Vitals/Pain Pain Assessment Pain Assessment: Faces Faces Pain Scale: Hurts a little bit Pain Location: R knee Pain Descriptors / Indicators: Discomfort Pain Intervention(s): Limited activity within patient's tolerance     Hand Dominance Right   Extremity/Trunk Assessment Upper Extremity Assessment Upper Extremity Assessment: Overall WFL for tasks assessed       Cervical / Trunk Assessment Cervical / Trunk Assessment: Normal   Communication Communication Communication: No difficulties   Cognition Arousal/Alertness: Awake/alert Behavior During Therapy: WFL for  tasks assessed/performed Overall Cognitive Status: Within Functional Limits for tasks assessed                                       General Comments  Educated on precautions, low-load long duration stretching.    Exercises Exercises:  (Patient provided handout and demonstrated the below exercises. Questions answered.)   Shoulder Instructions      Home Living Family/patient expects to be  discharged to:: Private residence Living Arrangements: Spouse/significant other Available Help at Discharge: Family;Available 24 hours/day Type of Home: House Home Access: Stairs to enter [employer] of Steps: 2 Entrance Stairs-Rails: None Home Layout: One level     Bathroom Shower/Tub: Occupational psychologist: Standard Bathroom Accessibility: Yes How Accessible: Accessible via walker Home Equipment: [place] (2 wheels);Shower seat - built in;Hand held shower head;Adaptive equipment Adaptive Equipment: Reacher        Prior Functioning/Environment Prior Level of Function : Independent/Modified Independent;Driving             Mobility Comments: Could ambulate in community; enjoys playing golf ADLs Comments: Independent ADLS and IADLs        OT Problem List: Decreased strength;Impaired balance (sitting and/or standing);Decreased knowledge of use of DME or AE;Decreased knowledge of precautions;Obesity;Pain      OT Treatment/Interventions:      OT Goals(Current goals can be found in the care plan section) Acute Rehab OT Goals Patient Stated Goal: to go home today OT Goal Formulation: All assessment and education complete, DC therapy  OT Frequency:      Co-evaluation              AM-PAC OT "6 Clicks" Daily Activity     Outcome Measure Help from another person eating meals?: None Help from another person taking care of personal grooming?: None Help from another person toileting, which includes using toliet, bedpan, or urinal?: None Help from another person bathing (including washing, rinsing, drying)?: A Little Help from another person to put on and taking off regular upper body clothing?: None Help from another person to put on and taking off regular lower body clothing?: A Little 6 Click Score: 22   End of Session Equipment Utilized During Treatment: Rolling walker (2 wheels) Nurse Communication: Mobility status  Activity Tolerance:  Patient tolerated treatment well Patient left: in bed;with call bell/phone within reach;with family/visitor present  OT Visit Diagnosis: Pain;Muscle weakness (generalized) (M62.81) Pain - Right/Left: Right Pain - part of body: Knee                Time: 1033-1050 OT Time Calculation (min): 17 min Charges:  OT General Charges $OT Visit: 1 Visit OT Evaluation $OT Eval Low Complexity: [place], OT/L   Acute OT Clinical Specialist Acute Rehabilitation Services Pager [phone] Office [phone]   [facility] 04/04/2022, 11:50 AM

## 2022-04-04 NOTE — Plan of Care (Signed)
  Problem: Education: Goal: Knowledge of the prescribed therapeutic regimen will improve Outcome: Completed/Met Goal: Individualized Educational Video(s) Outcome: Completed/Met   Problem: Activity: Goal: Ability to avoid complications of mobility impairment will improve Outcome: Completed/Met Goal: Range of joint motion will improve Outcome: Completed/Met   Problem: Clinical Measurements: Goal: Postoperative complications will be avoided or minimized Outcome: Completed/Met   Problem: Pain Management: Goal: Pain level will decrease with appropriate interventions Outcome: Completed/Met   Problem: Skin Integrity: Goal: Will show signs of wound healing Outcome: Completed/Met Patient alert and oriented, void, ambulate, surgical site clean and dry. D/c instructions explain and given to the patient, all questions answered. Will d/c patient home per order.

## 2022-04-04 NOTE — Progress Notes (Signed)
Physical Therapy Treatment Patient Details Name: Ricky Barton MRN: [id number] DOB: 1955-08-27 Today's Date: 04/04/2022   History of Present Illness Pt is 67 yo male s/p R TKA on 04/03/22 .  Pt with hx including arthritis, hypoglycemic syndrome, L ear hearing loss, OSA, isolated seizure, L TKA 04/2021    PT Comments    Tolerated treatment well. All goals met, adequate for d/c from mobility standpoint. Completed stair training with various approaches. Ambulating with light use of RW for support, no evidence of buckling or overt LOB. Reviewed handout with HEP which were demonstrated to patient. All questions answered. Will have adequate support at home.   Recommendations for follow up therapy are one component of a multi-disciplinary discharge planning process, led by the attending physician.  Recommendations may be updated based on patient status, additional functional criteria and insurance authorization.  Follow Up Recommendations  Follow physician's recommendations for discharge plan and follow up therapies     Assistance Recommended at Discharge Intermittent Supervision/Assistance  Patient can return home with the following A little help with bathing/dressing/bathroom;Assistance with cooking/housework;Help with stairs or ramp for entrance;Assist for transportation   Equipment Recommendations  None recommended by PT    Recommendations for Other Services       Precautions / Restrictions Precautions Precautions: Fall Restrictions Weight Bearing Restrictions: No RLE Weight Bearing: Weight bearing as tolerated     Mobility  Bed Mobility Overal bed mobility: Modified Independent             General bed mobility comments: No assist needed    Transfers Overall transfer level: Modified independent Equipment used: Rolling walker (2 wheels) Transfers: Sit to/from Stand Sit to Stand: Modified independent (Device/Increase time)           General transfer comment: Good  technique to rise and lower from bed. No physical assist needed, getting good flexion through RLE, tolerating fair amount of weight on RLE.    Ambulation/Gait Ambulation/Gait assistance: Supervision Gait Distance (Feet): 210 Feet Assistive device: Rolling walker (2 wheels) Gait Pattern/deviations: Decreased stride length, Decreased weight shift to right, Step-through pattern, Decreased stance time - right, Decreased step length - left, Antalgic Gait velocity: decreased Gait velocity interpretation: <1.8 ft/sec, indicate of risk for recurrent falls   General Gait Details: Progressed gait symmetry with education and cues. Very light use of RW for support. No buckling noted, good control with turns. Cues to keep RW on ground with turns.   Stairs Stairs: Yes Stairs assistance: Min assist Stair Management: No rails, Backwards, With walker Number of Stairs: 2 General stair comments: Education and demonstration of various approaches to step navigation similar to home environment. Pt practiced posterior approach with min assist for RW block only. Cues for sequencing. Wife will help at home. Feels confident with task.   Wheelchair Mobility    Modified Rankin (Stroke Patients Only)       Balance Overall balance assessment: Needs assistance Sitting-balance support: No upper extremity supported Sitting balance-Leahy Scale: Good     Standing balance support: No upper extremity supported, During functional activity Standing balance-Leahy Scale: Fair                              Cognition Arousal/Alertness: Awake/alert Behavior During Therapy: WFL for tasks assessed/performed Overall Cognitive Status: Within Functional Limits for tasks assessed  Exercises Total Joint Exercises Ankle Circles/Pumps: AROM, Both, 5 reps, Seated Quad Sets: Strengthening, Both, 5 reps, Supine Short Arc Quad: Strengthening, Both, 5 reps,  Supine Heel Slides: AAROM, Both, 5 reps, Supine Hip ABduction/ADduction: Strengthening, Right, 5 reps, Supine Straight Leg Raises: Strengthening, Right, 5 reps, Supine Long Arc Quad: AAROM, Strengthening, Right, 5 reps, Seated Knee Flexion: AROM, PROM, Right, 5 reps, Seated    General Comments General comments (skin integrity, edema, etc.): Educated on precautions, low-load long duration stretching.      Pertinent Vitals/Pain Pain Assessment Pain Assessment: Faces Faces Pain Scale: Hurts a little bit Pain Location: R knee Pain Descriptors / Indicators: Discomfort    Home Living                          Prior Function            PT Goals (current goals can now be found in the care plan section) Acute Rehab PT Goals Patient Stated Goal: return home PT Goal Formulation: With patient/family Time For Goal Achievement: 04/17/22 Potential to Achieve Goals: Good Progress towards PT goals: Progressing toward goals    Frequency    7X/week      PT Plan Current plan remains appropriate    Co-evaluation              AM-PAC PT "6 Clicks" Mobility   Outcome Measure  Help needed turning from your back to your side while in a flat bed without using bedrails?: None Help needed moving from lying on your back to sitting on the side of a flat bed without using bedrails?: None Help needed moving to and from a bed to a chair (including a wheelchair)?: None Help needed standing up from a chair using your arms (e.g., wheelchair or bedside chair)?: None Help needed to walk in hospital room?: A Little Help needed climbing 3-5 steps with a railing? : A Little 6 Click Score: 22    End of Session Equipment Utilized During Treatment: Gait belt Activity Tolerance: Patient tolerated treatment well Patient left: with call bell/phone within reach;in bed Nurse Communication: Mobility status PT Visit Diagnosis: Other abnormalities of gait and mobility (R26.89);Muscle weakness  (generalized) (M62.81)     Time: 6712-4580 PT Time Calculation (min) (ACUTE ONLY): 21 min  Charges:  $Gait Training: 8-22 mins $Therapeutic Exercise: 8-22 mins                     Candie Mile, PT, DPT Physical Therapist Acute Rehabilitation Services [place]    Ellouise Newer 04/04/2022, 9:19 AM

## 2022-04-04 NOTE — Discharge Summary (Signed)
Patient ID: Ricky Barton MRN: [id number] DOB/AGE: 08-07-1955 67 y.o.  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 male who was admitted 04/03/2022 with a diagnosis of Status post total right knee replacement and went to the operating room on 04/03/2022 and underwent the above named procedures.    He was given perioperative antibiotics:  Anti-infectives (From admission, onward)    Start     Dose/Rate Route Frequency Ordered Stop   04/04/22 1000  cefadroxil (DURICEF) capsule 500 mg       Note to Pharmacy: To be taken after surgery     500 mg Oral 2 times daily 04/03/22 1508     04/03/22 1730  cefadroxil (DURICEF) capsule 500 mg  Status:  Discontinued       Note to Pharmacy: To be taken after surgery     500 mg Oral 2 times daily 04/03/22 1505 04/03/22 1508   04/03/22 1730  ceFAZolin (ANCEF) IVPB 2g/100 mL premix        2 g 200 mL/hr over 30 Minutes Intravenous Every 6 hours 04/03/22 1506 04/03/22 2347   04/03/22 1215  vancomycin (VANCOCIN) powder  Status:  Discontinued          As needed 04/03/22 1215 04/03/22 1346   04/03/22 0945  ceFAZolin (ANCEF) IVPB 2g/100 mL premix        2 g 200 mL/hr over 30 Minutes Intravenous On call to O.R. 04/03/22 0930 04/03/22 1210     .  He was given sequential  compression devices, early ambulation, and appropriate chemoprophylaxis for DVT prophylaxis.  He benefited maximally from the hospital stay and there were no complications.    Recent vital signs:  Vitals:   04/04/22 0345 04/04/22 0722  BP: 115/71 135/80  Pulse: 96 93  Resp: 18 16  Temp: 98.9 F (37.2 C) 98.1 F (36.7 C)  SpO2: 95% 95%    Recent laboratory studies:  Lab Results  Component Value Date   HGB 14.7 03/27/2022   HGB 14.1 05/13/2021   HGB 14.4 05/08/2021   Lab Results  Component Value Date   WBC 9.1 03/27/2022   PLT 306 03/27/2022   No results found for: "INR" Lab Results  Component Value Date   NA 138 03/27/2022   K 4.2 03/27/2022   CL 103 03/27/2022   CO2 25 03/27/2022   BUN 12 03/27/2022   CREATININE 0.70 03/27/2022   GLUCOSE 105 (H) 03/27/2022    Discharge Medications:   Allergies as of 04/04/2022       Reactions   Piroxicam Rash   blisters        Medication List     TAKE these medications    amoxicillin 500 MG capsule Commonly known as: AMOXIL Take  four pills one hour prior to dental work   aspirin EC 81 MG tablet Take 1 tablet (81 mg total) by mouth 2 (two) times daily. To be taken after surgery to prevent blood clots What changed:  when to take this additional instructions   aspirin EC 81 MG tablet Take 1 tablet (81 mg total) by mouth 2 (two) times daily. To be taken after surgery to prevent blood clots What changed: Another medication with the same name was changed. Make sure you understand how and when to take each.   cefadroxil 500 MG capsule Commonly known as: DURICEF Take 1 capsule (500 mg total) by mouth 2 (two) times daily. To be taken after surgery   cetirizine 10 MG tablet Commonly known as: ZYRTEC TAKE 1 TABLET BY MOUTH EVERY DAY What changed:  when to take this reasons to take this   cyanocobalamin 1000 MCG tablet Take 1 tablet (1,000 mcg total) by mouth every Monday, Wednesday, and Friday. Vit b12   docusate  sodium 100 MG capsule Commonly known as: Colace Take 1 capsule (100 mg total) by mouth daily as needed. What changed: additional instructions   Fish Oil 1000 MG Caps Take 1 capsule (1,000 mg total) by mouth daily.   ibuprofen 200 MG tablet Commonly known as: ADVIL Take 400 mg by mouth every 6 (six) hours as needed for moderate pain.   methocarbamol 750 MG tablet Commonly known as: Robaxin-750 Take 1 tablet (750 mg total) by mouth 2 (two) times daily as needed for muscle spasms. What changed: additional instructions   ondansetron 4 MG tablet Commonly known as: Zofran Take 1 tablet (4 mg total) by mouth every 8 (eight) hours as needed for nausea or vomiting. What changed: additional instructions   OSTEO BI-FLEX REGULAR STRENGTH PO Take 2 tablets by mouth daily.   oxyCODONE-acetaminophen 5-325 MG tablet Commonly known as: Percocet Take 1-2 tablets by mouth every 6 (six) hours as needed. To be taken after surgery   pravastatin 20 MG tablet Commonly known as: PRAVACHOL Take 1 tablet (20 mg total) by mouth daily.   sildenafil 100 MG tablet Commonly known as: Viagra TAKE 1/2 TO 1 TABLET BY MOUTH 1 HOUR PRIOR AS NEEDED   sodium chloride 0.65 % Soln nasal spray Commonly known as: OCEAN Place 1 spray into both nostrils at bedtime.               Durable Medical Equipment  (From admission, onward)           Start     Ordered   04/03/22 1506  DME Walker rolling  Once       Question Answer Comment  Walker: With 5 Inch Wheels   Patient needs a walker to treat with the following condition Status post left partial knee replacement      04/03/22 1506   04/03/22 1506  DME 3 n 1  Once        04/03/22 1506   04/03/22 1506  DME Bedside commode  Once       Question:  Patient needs a bedside commode to treat with the following condition  Answer:  Status post left partial knee replacement   04/03/22 1506            Diagnostic Studies: DG Knee Right Port  Result  Date: 04/03/2022 CLINICAL DATA:  Right knee replacement. EXAM: PORTABLE RIGHT KNEE - 1-2 VIEW COMPARISON:  Right knee x-rays from same day. FINDINGS: The right knee demonstrates a total knee  arthroplasty without evidence of hardware failure or complication. There is expected intra-articular air. There is no fracture or dislocation. The alignment is anatomic. Post-surgical changes noted in the surrounding soft tissues. IMPRESSION: 1. Right total knee arthroplasty without evidence of acute postoperative complication. Electronically Signed   By: Titus Dubin M.D.   On: 04/03/2022 14:22   DG Knee 1-2 Views Right  Result Date: 04/03/2022 CLINICAL DATA:  Right knee arthroplasty. Rule out retained instrument. EXAM: RIGHT KNEE - 1-2 VIEW COMPARISON:  Knee radiographs 12/26/2020 FINDINGS: The patient is undergoing right knee arthroplasty. There is no radiopaque foreign body identified. IMPRESSION: No radiopaque foreign body identified. These results were called to the operating room at the time of interpretation on 04/03/2022 at 1:03 pm. Electronically Signed   By: Valetta Mole M.D.   On: 04/03/2022 13:09    Disposition: Discharge disposition: 01-Home or Self Care       Discharge Instructions     Call MD / Call 911   Complete by: As directed    If you experience chest pain or shortness of breath, CALL 911 and be transported to the hospital emergency room.  If you develope a fever above 101.5 F, pus (white drainage) or increased drainage or redness at the wound, or calf pain, call your surgeon's office.   Constipation Prevention   Complete by: As directed    Drink plenty of fluids.  Prune juice may be helpful.  You may use a stool softener, such as Colace (over the counter) 100 mg twice a day.  Use MiraLax (over the counter) for constipation as needed.   Driving restrictions   Complete by: As directed    No driving while taking narcotic pain meds.   Increase activity slowly as tolerated   Complete  by: As directed    Post-operative opioid taper instructions:   Complete by: As directed    POST-OPERATIVE OPIOID TAPER INSTRUCTIONS: It is important to wean off of your opioid medication as soon as possible. If you do not need pain medication after your surgery it is ok to stop day one. Opioids include: Codeine, Hydrocodone(Norco, Vicodin), Oxycodone(Percocet, oxycontin) and hydromorphone amongst others.  Long term and even short term use of opiods can cause: Increased pain response Dependence Constipation Depression Respiratory depression And more.  Withdrawal symptoms can include Flu like symptoms Nausea, vomiting And more Techniques to manage these symptoms Hydrate well Eat regular healthy meals Stay active Use relaxation techniques(deep breathing, meditating, yoga) Do Not substitute Alcohol to help with tapering If you have been on opioids for less than two weeks and do not have pain than it is ok to stop all together.  Plan to wean off of opioids This plan should start within one week post op of your joint replacement. Maintain the same interval or time between taking each dose and first decrease the dose.  Cut the total daily intake of opioids by one tablet each day Next start to increase the time between doses. The last dose that should be eliminated is the evening dose.           Follow-up Information     Leandrew Koyanagi, MD. Go on 04/17/2022.   Specialty: Orthopedic Surgery Why: at 10:30 am for your first in office post operative appointment with Dr. Sherilyn Cooter information: [place] [id number] [facility] Follow up.   Why: Someone from the home health agency will be  in contact with you prior to your first in home visit for Physical Therapy.                 Signed: Eduard Roux 04/04/2022, 8:50 AM

## 2022-04-06 ENCOUNTER — Encounter (HOSPITAL_COMMUNITY): Payer: Self-pay | Admitting: Orthopaedic Surgery

## 2022-04-06 ENCOUNTER — Telehealth: Payer: Self-pay | Admitting: *Deleted

## 2022-04-06 DIAGNOSIS — Z471 Aftercare following joint replacement surgery: Secondary | ICD-10-CM | POA: Diagnosis not present

## 2022-04-06 DIAGNOSIS — Z9181 History of falling: Secondary | ICD-10-CM | POA: Diagnosis not present

## 2022-04-06 DIAGNOSIS — R569 Unspecified convulsions: Secondary | ICD-10-CM | POA: Diagnosis not present

## 2022-04-06 DIAGNOSIS — H9192 Unspecified hearing loss, left ear: Secondary | ICD-10-CM | POA: Diagnosis not present

## 2022-04-06 DIAGNOSIS — Z7982 Long term (current) use of aspirin: Secondary | ICD-10-CM | POA: Diagnosis not present

## 2022-04-06 DIAGNOSIS — E538 Deficiency of other specified B group vitamins: Secondary | ICD-10-CM | POA: Diagnosis not present

## 2022-04-06 DIAGNOSIS — Z96651 Presence of right artificial knee joint: Secondary | ICD-10-CM | POA: Diagnosis not present

## 2022-04-06 DIAGNOSIS — Z9049 Acquired absence of other specified parts of digestive tract: Secondary | ICD-10-CM | POA: Diagnosis not present

## 2022-04-06 DIAGNOSIS — E785 Hyperlipidemia, unspecified: Secondary | ICD-10-CM | POA: Diagnosis not present

## 2022-04-06 DIAGNOSIS — Z96652 Presence of left artificial knee joint: Secondary | ICD-10-CM | POA: Diagnosis not present

## 2022-04-06 DIAGNOSIS — G4733 Obstructive sleep apnea (adult) (pediatric): Secondary | ICD-10-CM | POA: Diagnosis not present

## 2022-04-06 NOTE — Telephone Encounter (Signed)
Ortho bundle D/C call completed. 

## 2022-04-16 DIAGNOSIS — E785 Hyperlipidemia, unspecified: Secondary | ICD-10-CM | POA: Diagnosis not present

## 2022-04-16 DIAGNOSIS — Z471 Aftercare following joint replacement surgery: Secondary | ICD-10-CM | POA: Diagnosis not present

## 2022-04-16 DIAGNOSIS — Z96651 Presence of right artificial knee joint: Secondary | ICD-10-CM | POA: Diagnosis not present

## 2022-04-16 DIAGNOSIS — R569 Unspecified convulsions: Secondary | ICD-10-CM | POA: Diagnosis not present

## 2022-04-16 DIAGNOSIS — Z9181 History of falling: Secondary | ICD-10-CM | POA: Diagnosis not present

## 2022-04-16 DIAGNOSIS — E538 Deficiency of other specified B group vitamins: Secondary | ICD-10-CM | POA: Diagnosis not present

## 2022-04-16 DIAGNOSIS — G4733 Obstructive sleep apnea (adult) (pediatric): Secondary | ICD-10-CM | POA: Diagnosis not present

## 2022-04-16 DIAGNOSIS — H9192 Unspecified hearing loss, left ear: Secondary | ICD-10-CM | POA: Diagnosis not present

## 2022-04-16 DIAGNOSIS — Z7982 Long term (current) use of aspirin: Secondary | ICD-10-CM | POA: Diagnosis not present

## 2022-04-16 DIAGNOSIS — Z9049 Acquired absence of other specified parts of digestive tract: Secondary | ICD-10-CM | POA: Diagnosis not present

## 2022-04-16 DIAGNOSIS — Z96652 Presence of left artificial knee joint: Secondary | ICD-10-CM | POA: Diagnosis not present

## 2022-04-17 ENCOUNTER — Telehealth: Payer: Self-pay | Admitting: *Deleted

## 2022-04-17 ENCOUNTER — Ambulatory Visit: Payer: PPO | Admitting: Rehabilitative and Restorative Service Providers"

## 2022-04-17 ENCOUNTER — Ambulatory Visit (INDEPENDENT_AMBULATORY_CARE_PROVIDER_SITE_OTHER): Payer: PPO | Admitting: Physician Assistant

## 2022-04-17 ENCOUNTER — Encounter: Payer: Self-pay | Admitting: Rehabilitative and Restorative Service Providers"

## 2022-04-17 DIAGNOSIS — R600 Localized edema: Secondary | ICD-10-CM | POA: Diagnosis not present

## 2022-04-17 DIAGNOSIS — Z96651 Presence of right artificial knee joint: Secondary | ICD-10-CM

## 2022-04-17 DIAGNOSIS — R262 Difficulty in walking, not elsewhere classified: Secondary | ICD-10-CM | POA: Diagnosis not present

## 2022-04-17 DIAGNOSIS — M6281 Muscle weakness (generalized): Secondary | ICD-10-CM

## 2022-04-17 DIAGNOSIS — M25561 Pain in right knee: Secondary | ICD-10-CM

## 2022-04-17 DIAGNOSIS — M25661 Stiffness of right knee, not elsewhere classified: Secondary | ICD-10-CM

## 2022-04-17 NOTE — Telephone Encounter (Signed)
Ortho bundle in office meeting today.

## 2022-04-17 NOTE — Progress Notes (Signed)
Post-Op Visit Note   Patient: Ricky Barton           Date of Birth: October 26, 1955           MRN: [id number] Visit Date: 04/17/2022 PCP: Ria Bush, MD   Assessment & Plan:  Chief Complaint:  Chief Complaint  Patient presents with   Right Knee - Routine Post Op   Visit Diagnoses:  1. Status post total right knee replacement     Plan: Patient is a pleasant 67 year old gentleman who comes in today 2 weeks status post right total knee replacement 04/03/2022.  He has been doing well.  Has been getting home health physical therapy and is making great progress.  He is ambulating with a single-point cane.  He is scheduled to start outpatient physical therapy this afternoon.  He has been compliant taking a baby aspirin twice daily for DVT prophylaxis.  He is taking Tylenol and occasional Percocet for pain.  Examination of his right knee reveals a well-healed surgical incision with nylon sutures in place.  No evidence of infection or cellulitis.  Calves are soft nontender.  He is neurovascular intact distally.  Today, sutures were removed and Steri-Strips were applied.  He already has an appointment for outpatient physical therapy scheduled this afternoon.  He will continue taking his baby aspirin twice daily for DVT prophylaxis.  Follow-up in 4 weeks for repeat evaluation and 2 view x-rays of the right knee.  Call with concerns or questions.  Follow-Up Instructions: Return in about 4 weeks (around 05/15/2022).   Orders:  No orders of the defined types were placed in this encounter.  No orders of the defined types were placed in this encounter.   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   Elevated blood pressure reading without diagnosis of hypertension 11/10/2018   Renal lesion 03/28/2018   Arthralgia of hands, bilateral 09/12/2015   OSA on CPAP 09/12/2015   Obesity, Class I, BMI 30-34.9 04/05/2014   Hyperglycemia 03/30/2013   Healthcare maintenance 03/04/2012   HLD (hyperlipidemia) 01/18/2009   ERECTILE DYSFUNCTION, 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   History of seizure 08/15/1991   Past Medical History:  Diagnosis Date   Arthritis    bilateral knees   Hypoglycemic syndrome    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    OSA on CPAP 09/12/2015   Severe by HST   Seizures ([place]) 1990s   isolated, none since, was on dilantin for 3 yrs, then stopped   Sleep apnea    uses CPAP nightly    Family History  Problem Relation Age of Onset   Stomach cancer Mother    Hypertension Mother    Colon polyps Mother    Cancer Father 22       stomach cancer   Hyperlipidemia Brother    CAD Brother 1       MI (smoker)   Hyperlipidemia Brother    Sleep apnea Brother    Pancreatic cancer Brother 60   Hyperlipidemia Brother  CAD Brother 53       MI (smoker)   Hyperlipidemia Brother    Multiple sclerosis Brother    Colon cancer Neg Hx    Esophageal cancer Neg Hx    Rectal cancer Neg Hx     Past Surgical History:  Procedure Laterality Date   CHOLECYSTECTOMY  1989   COLONOSCOPY  03/2011   3 adenomatous polyps, rec rpt 5 yrs (Dr Fuller Plan).   COLONOSCOPY  07/2016   WNL, rpt 5 yrs Fuller Plan)   COLONOSCOPY  11/2021   2 HPs, rpt 7 yrs Fuller Plan)   Head MRI  08/2000   POLYPECTOMY  2013   TA"s x 3   TOTAL KNEE ARTHROPLASTY Left 05/12/2021   Procedure: LEFT TOTAL KNEE ARTHROPLASTY;  Surgeon: Leandrew Koyanagi, MD;  Location: [place];  Service: Orthopedics;  Laterality: Left;    TOTAL KNEE ARTHROPLASTY Right 04/03/2022   Procedure: RIGHT TOTAL KNEE ARTHROPLASTY;  Surgeon: Leandrew Koyanagi, MD;  Location: [place];  Service: Orthopedics;  Laterality: Right;   Social History   Occupational History   Occupation: Radiographer, therapeutic: [employer]    Comment: [employer]  Tobacco Use   Smoking status: Never   Smokeless tobacco: Never  Vaping Use   Vaping Use: Never used  Substance and Sexual Activity   Alcohol use: No   Drug use: No   Sexual activity: Yes

## 2022-04-17 NOTE — Therapy (Signed)
OUTPATIENT PHYSICAL THERAPY LOWER EXTREMITY EVALUATION   Patient Name: Ricky Barton MRN: [id number] DOB:[birth year], 67 y.o., male Today's Date: 04/17/2022  END OF SESSION:  PT End of Session - 04/17/22 1606     Visit Number 1    Number of Visits 16    Date for PT Re-Evaluation 06/12/22    Authorization Type Ortho bundle    Authorization - Number of Visits 16    Progress Note Due on Visit 10    PT Start Time 0814    PT Stop Time 1230    PT Time Calculation (min) 45 min    Activity Tolerance Patient tolerated treatment well;No increased pain;Patient limited by pain    Behavior During Therapy [facility] for tasks assessed/performed             Past Medical History:  Diagnosis Date   Arthritis    bilateral knees   Hypoglycemic syndrome    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   COLONOSCOPY  07/2016   WNL, rpt 5 yrs Fuller Plan)   COLONOSCOPY  11/2021   2 HPs, rpt 7 yrs Fuller Plan)   Head MRI  08/2000   POLYPECTOMY  2013   TA"s x 3   TOTAL KNEE ARTHROPLASTY Left 05/12/2021   Procedure: LEFT TOTAL KNEE ARTHROPLASTY;  Surgeon: Leandrew Koyanagi, MD;  Location: [place];  Service: Orthopedics;  Laterality: Left;   TOTAL KNEE ARTHROPLASTY Right 04/03/2022   Procedure: RIGHT TOTAL KNEE ARTHROPLASTY;  Surgeon: Leandrew Koyanagi, MD;  Location: [place];  Service: Orthopedics;  Laterality: Right;   Patient Active Problem List   Diagnosis Date Noted   Status post total right knee replacement 04/03/2022   Primary osteoarthritis of right knee 02/04/2022   Primary osteoarthritis of left knee 05/12/2021   Status post total left knee replacement 05/12/2021    Allergic rhinitis 12/09/2020   Low serum vitamin B12 12/07/2020   Welcome to Medicare preventive visit 12/06/2020   Advanced directives, counseling/discussion 12/06/2020   Angular cheilitis 12/06/2020   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   Elevated blood pressure reading without diagnosis of hypertension 11/10/2018   Renal lesion 03/28/2018   Arthralgia of hands, bilateral 09/12/2015   OSA on CPAP 09/12/2015   Obesity, Class I, BMI 30-34.9 04/05/2014   Hyperglycemia 03/30/2013   Healthcare maintenance 03/04/2012   HLD (hyperlipidemia) 01/18/2009   ERECTILE DYSFUNCTION, 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   History of seizure 08/15/1991    PCP: Ria Bush, MD  REFERRING PROVIDER: Leandrew Koyanagi, MD  REFERRING DIAG:  Diagnosis  M17.11 (ICD-10-CM) - Primary osteoarthritis of right knee    THERAPY DIAG:  Difficulty in walking, not elsewhere classified - Plan: PT plan of care cert/re-cert  Muscle weakness (generalized) - Plan: PT plan of care cert/re-cert  Localized edema - Plan: PT plan of care cert/re-cert  Stiffness of right knee, not elsewhere classified - Plan: PT plan of care cert/re-cert  Right knee pain, unspecified chronicity - Plan: PT plan of care cert/re-cert  Rationale for Evaluation and Treatment: Rehabilitation  ONSET DATE: R knee TKA 04/03/2022  SUBJECTIVE:   SUBJECTIVE STATEMENT: Esa had his right knee replaced 04/03/2022.  This is his second total knee in the last year, so he is very familiar with the process.  PERTINENT HISTORY: Bil knee OA, Lt ear hearing loss, h/o seizures, Lt TKA 05/12/2021, HLD PAIN:  Are you having pain? Yes: NPRS scale: 3-7/10 Pain location: Right knee Pain description: Ache, tight and can be sharp Aggravating factors: Prolonged postures and too much weightbearing Relieving factors: Ice, pain medication and exercises  PRECAUTIONS: None  WEIGHT BEARING RESTRICTIONS: No  FALLS:  Has patient fallen in last 6 months? No  LIVING  ENVIRONMENT: Lives with: Single point cane Lives in: House/apartment Stairs:  Uses handrail Has following equipment at home: Single point cane  OCCUPATION: Retired vending  PLOF: Independent  PATIENT GOALS: Get back to normal.  Return to golf.  NEXT MD VISIT: March 1st  OBJECTIVE:   DIAGNOSTIC FINDINGS: IMPRESSION: 1. Right total knee arthroplasty without evidence of acute postoperative complication.  PATIENT SURVEYS:  FOTO 47 (Goal 62 in 12 visits)  COGNITION: Overall cognitive status: Within functional limits for tasks assessed     SENSATION: No tingling or paresthesias  EDEMA:  Noted and not objectively assessed   LOWER EXTREMITY ROM:  Active ROM Right 04/17/2022 Left 04/17/2022  Hip flexion    Hip extension    Hip abduction    Hip adduction    Hip internal rotation    Hip external rotation    Knee flexion 68 106  Knee extension -13 -2  Ankle dorsiflexion    Ankle plantarflexion    Ankle inversion    Ankle eversion     (Blank rows = not tested)  LOWER EXTREMITY STRENGTH:  MMT Right 04/17/2022 Left 04/17/2022  Hip flexion    Hip extension    Hip abduction    Hip adduction    Hip internal rotation    Hip external rotation    Knee flexion    Knee extension 19.3 pounds 87.2 pounds  Ankle dorsiflexion    Ankle plantarflexion    Ankle inversion    Ankle eversion     (Blank rows = not tested)  GAIT: Distance walked: Within the clinic Assistive device utilized: Single point cane Level of assistance: Complete Independence Comments: Crayton would like to return to walking without the cane   TODAY'S TREATMENT:                                                                                                                              DATE: 04/17/2022  Quadriceps sets with right heel prop 2 sets of 10 for 5 seconds Tailgate knee flexion 1 minute Right knee flexion AAROM (left pushes right into flexion) 10 x 10 seconds Prone knee extension stretch with  rolled up towels above both knees 3 minutes  PATIENT EDUCATION:  Education details: Reviewed exam findings and home exercises Person educated: Patient and Spouse Education method: Explanation, Demonstration, Tactile cues, Verbal  cues, and Handouts Education comprehension: verbalized understanding, returned demonstration, verbal cues required, tactile cues required, and needs further education  HOME EXERCISE PROGRAM: Access Code: [email or web address] URL: [email or web address] Date: 04/17/2022 Prepared by: Vista Mink  Exercises - Supine Quadricep Sets  - 5 x daily - 7 x weekly - 2 sets - 10 reps - 5 second hold - Seated Knee Flexion AAROM  - 3-5 x daily - 7 x weekly - 1 sets - 1 reps - 3 minutes hold - Prone Knee Extension with Ankle Weight  - 2 x daily - 7 x weekly - 3-5 minutes hold  ASSESSMENT:  CLINICAL IMPRESSION: Patient is a 67 y.o. male who was seen today for physical therapy evaluation and treatment for s/p right TKA 04/03/2022.  Josef is familiar with the process as he had a left TKA in February of last year.  AROM, edema control and quadriceps strength will be the early emphasis.  OBJECTIVE IMPAIRMENTS: Abnormal gait, decreased activity tolerance, decreased balance, decreased endurance, and decreased knowledge of condition.   ACTIVITY LIMITATIONS: carrying, bending, sitting, standing, squatting, sleeping, stairs, and locomotion level  PARTICIPATION LIMITATIONS: driving, community activity, and yard work  PERSONAL FACTORS: Bil knee OA, Lt ear hearing loss, h/o seizures, Lt TKA 05/12/2021, HLD are also affecting patient's functional outcome.   REHAB POTENTIAL: Good  CLINICAL DECISION MAKING: Stable/uncomplicated  EVALUATION COMPLEXITY: Low   GOALS: Goals reviewed with patient? Yes  SHORT TERM GOALS: Target date: 05/15/2022 Improve right knee AROM for extension to -5 and flexion to 90 degrees Baseline: 13-0-68 degrees Goal status: INITIAL  2.  Lazaro will be  independent with his day 1 home exercise program Baseline: Started 04/17/2022 Goal status: INITIAL   LONG TERM GOALS: Target date: 06/12/2022  Improve FOTO to 62 Baseline: 47 Goal status: INITIAL  2.  Deone will report right knee pain consistently 0-3 out of 10 on the visual analog scale Baseline: 3-7 out of 10 Goal status: INITIAL  3.  Improve right knee AROM to 2-0-100 Baseline: 13-0-68 Goal status: INITIAL  4.  Improve right quadriceps strength to at least 60% of the uninvolved left Baseline: 22% Goal status: INITIAL  5.  Rande will be able to walk 500 feet without a cane and manage 5 steps with subjective reports of no problem Baseline: Using a cane and has to use the handrail with 2 steps at home Goal status: INITIAL  6.  Jejuan will be independent with his long-term home exercise program at discharge Baseline: Started 04/17/2022 Goal status: INITIAL   PLAN:  PT FREQUENCY:  3 times a week for 2 weeks then switch to twice a week  PT DURATION: 8 weeks  PLANNED INTERVENTIONS: Therapeutic exercises, Therapeutic activity, Neuromuscular re-education, Balance training, Gait training, Patient/Family education, Self Care, Joint mobilization, Stair training, Cryotherapy, Vasopneumatic device, and Manual therapy  PLAN FOR NEXT SESSION: Emphasis on AROM.  Quadriceps strengthening, balance and functional activities as appropriate.  Vaso to end.   Farley Ly, PT, MPT 04/17/2022, 4:25 PM

## 2022-04-20 ENCOUNTER — Encounter: Payer: Self-pay | Admitting: Physical Therapy

## 2022-04-20 ENCOUNTER — Ambulatory Visit: Payer: PPO | Admitting: Physical Therapy

## 2022-04-20 DIAGNOSIS — M25561 Pain in right knee: Secondary | ICD-10-CM

## 2022-04-20 DIAGNOSIS — M25661 Stiffness of right knee, not elsewhere classified: Secondary | ICD-10-CM | POA: Diagnosis not present

## 2022-04-20 DIAGNOSIS — M6281 Muscle weakness (generalized): Secondary | ICD-10-CM

## 2022-04-20 DIAGNOSIS — R262 Difficulty in walking, not elsewhere classified: Secondary | ICD-10-CM | POA: Diagnosis not present

## 2022-04-20 DIAGNOSIS — R600 Localized edema: Secondary | ICD-10-CM | POA: Diagnosis not present

## 2022-04-20 NOTE — Therapy (Deleted)
OUTPATIENT PHYSICAL THERAPY LOWER EXTREMITY EVALUATION   Patient Name: Ricky Barton MRN: [id number] DOB:May 05, 1955, 67 y.o., male Today's Date: 04/17/2022  END OF SESSION:  PT End of Session - 04/17/22 1606     Visit Number 1    Number of Visits 16    Date for PT Re-Evaluation 06/12/22    Authorization Type Ortho bundle    Authorization - Number of Visits 16    Progress Note Due on Visit 10    PT Start Time 9509    PT Stop Time 1230    PT Time Calculation (min) 45 min    Activity Tolerance Patient tolerated treatment well;No increased pain;Patient limited by pain    Behavior During Therapy [facility] for tasks assessed/performed             Past Medical History:  Diagnosis Date   Arthritis    bilateral knees   Hypoglycemic syndrome    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   COLONOSCOPY  07/2016   WNL, rpt 5 yrs Fuller Plan)   COLONOSCOPY  11/2021   2 HPs, rpt 7 yrs Fuller Plan)   Head MRI  08/2000   POLYPECTOMY  2013   TA"s x 3   TOTAL KNEE ARTHROPLASTY Left 05/12/2021   Procedure: LEFT TOTAL KNEE ARTHROPLASTY;  Surgeon: Leandrew Koyanagi, MD;  Location: [place];  Service: Orthopedics;  Laterality: Left;   TOTAL KNEE ARTHROPLASTY Right 04/03/2022   Procedure: RIGHT TOTAL KNEE ARTHROPLASTY;  Surgeon: Leandrew Koyanagi, MD;  Location: [place];  Service: Orthopedics;  Laterality: Right;   Patient Active Problem List   Diagnosis Date Noted   Status post total right knee replacement 04/03/2022   Primary osteoarthritis of right knee 02/04/2022   Primary osteoarthritis of left knee 05/12/2021   Status post total left knee replacement 05/12/2021    Allergic rhinitis 12/09/2020   Low serum vitamin B12 12/07/2020   Welcome to Medicare preventive visit 12/06/2020   Advanced directives, counseling/discussion 12/06/2020   Angular cheilitis 12/06/2020   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   Elevated blood pressure reading without diagnosis of hypertension 11/10/2018   Renal lesion 03/28/2018   Arthralgia of hands, bilateral 09/12/2015   OSA on CPAP 09/12/2015   Obesity, Class I, BMI 30-34.9 04/05/2014   Hyperglycemia 03/30/2013   Healthcare maintenance 03/04/2012   HLD (hyperlipidemia) 01/18/2009   ERECTILE DYSFUNCTION, 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   History of seizure 08/15/1991    PCP: Ria Bush, MD  REFERRING PROVIDER: Leandrew Koyanagi, MD  REFERRING DIAG:  Diagnosis  M17.11 (ICD-10-CM) - Primary osteoarthritis of right knee    THERAPY DIAG:  Difficulty in walking, not elsewhere classified - Plan: PT plan of care cert/re-cert  Muscle weakness (generalized) - Plan: PT plan of care cert/re-cert  Localized edema - Plan: PT plan of care cert/re-cert  Stiffness of right knee, not elsewhere classified - Plan: PT plan of care cert/re-cert  Right knee pain, unspecified chronicity - Plan: PT plan of care cert/re-cert  Rationale for Evaluation and Treatment: Rehabilitation  ONSET DATE: R knee TKA 04/03/2022  SUBJECTIVE:   SUBJECTIVE STATEMENT: Javon had his right knee replaced 04/03/2022.  This is his second total knee in the last year, so he is very familiar with the process.  PERTINENT HISTORY: Bil knee OA, Lt ear hearing loss, h/o seizures, Lt TKA 05/12/2021, HLD PAIN:  Are you having pain? Yes: NPRS scale: 3-7/10 Pain location: Right knee Pain description: Ache, tight and can be sharp Aggravating factors: Prolonged postures and too much weightbearing Relieving factors: Ice, pain medication and exercises  PRECAUTIONS: None  WEIGHT BEARING RESTRICTIONS: No  FALLS:  Has patient fallen in last 6 months? No  LIVING  ENVIRONMENT: Lives with: Single point cane Lives in: House/apartment Stairs:  Uses handrail Has following equipment at home: Single point cane  OCCUPATION: Retired vending  PLOF: Independent  PATIENT GOALS: Get back to normal.  Return to golf.  NEXT MD VISIT: March 1st  OBJECTIVE:   DIAGNOSTIC FINDINGS: IMPRESSION: 1. Right total knee arthroplasty without evidence of acute postoperative complication.  PATIENT SURVEYS:  FOTO 47 (Goal 62 in 12 visits)  COGNITION: Overall cognitive status: Within functional limits for tasks assessed     SENSATION: No tingling or paresthesias  EDEMA:  Noted and not objectively assessed   LOWER EXTREMITY ROM:  Active ROM Right 04/17/2022 Left 04/17/2022  Hip flexion    Hip extension    Hip abduction    Hip adduction    Hip internal rotation    Hip external rotation    Knee flexion 68 106  Knee extension -13 -2  Ankle dorsiflexion    Ankle plantarflexion    Ankle inversion    Ankle eversion     (Blank rows = not tested)  LOWER EXTREMITY STRENGTH:  MMT Right 04/17/2022 Left 04/17/2022  Hip flexion    Hip extension    Hip abduction    Hip adduction    Hip internal rotation    Hip external rotation    Knee flexion    Knee extension 19.3 pounds 87.2 pounds  Ankle dorsiflexion    Ankle plantarflexion    Ankle inversion    Ankle eversion     (Blank rows = not tested)  GAIT: Distance walked: Within the clinic Assistive device utilized: Single point cane Level of assistance: Complete Independence Comments: [name] would like to return to walking without the cane   TODAY'S TREATMENT:                                                                                                                              DATE: 04/17/2022  Quadriceps sets with right heel prop 2 sets of 10 for 5 seconds Tailgate knee flexion 1 minute Right knee flexion AAROM (left pushes right into flexion) 10 x 10 seconds Prone knee extension stretch with  rolled up towels above both knees 3 minutes  PATIENT EDUCATION:  Education details: Reviewed exam findings and home exercises Person educated: Patient and Spouse Education method: Explanation, Demonstration, Tactile cues, Verbal  cues, and Handouts Education comprehension: verbalized understanding, returned demonstration, verbal cues required, tactile cues required, and needs further education  HOME EXERCISE PROGRAM: Access Code: [email or web address] URL: [email or web address] Date: 04/17/2022 Prepared by: Vista Mink  Exercises - Supine Quadricep Sets  - 5 x daily - 7 x weekly - 2 sets - 10 reps - 5 second hold - Seated Knee Flexion AAROM  - 3-5 x daily - 7 x weekly - 1 sets - 1 reps - 3 minutes hold - Prone Knee Extension with Ankle Weight  - 2 x daily - 7 x weekly - 3-5 minutes hold  ASSESSMENT:  CLINICAL IMPRESSION: Patient is a 67 y.o. male who was seen today for physical therapy evaluation and treatment for s/p right TKA 04/03/2022.  Branton is familiar with the process as he had a left TKA in February of last year.  AROM, edema control and quadriceps strength will be the early emphasis.  OBJECTIVE IMPAIRMENTS: Abnormal gait, decreased activity tolerance, decreased balance, decreased endurance, and decreased knowledge of condition.   ACTIVITY LIMITATIONS: carrying, bending, sitting, standing, squatting, sleeping, stairs, and locomotion level  PARTICIPATION LIMITATIONS: driving, community activity, and yard work  PERSONAL FACTORS: Bil knee OA, Lt ear hearing loss, h/o seizures, Lt TKA 05/12/2021, HLD are also affecting patient's functional outcome.   REHAB POTENTIAL: Good  CLINICAL DECISION MAKING: Stable/uncomplicated  EVALUATION COMPLEXITY: Low   GOALS: Goals reviewed with patient? Yes  SHORT TERM GOALS: Target date: 05/15/2022 Improve right knee AROM for extension to -5 and flexion to 90 degrees Baseline: 13-0-68 degrees Goal status: INITIAL  2.  Chaze will be  independent with his day 1 home exercise program Baseline: Started 04/17/2022 Goal status: INITIAL   LONG TERM GOALS: Target date: 06/12/2022  Improve FOTO to 62 Baseline: 47 Goal status: INITIAL  2.  Micha will report right knee pain consistently 0-3 out of 10 on the visual analog scale Baseline: 3-7 out of 10 Goal status: INITIAL  3.  Improve right knee AROM to 2-0-100 Baseline: 13-0-68 Goal status: INITIAL  4.  Improve right quadriceps strength to at least 60% of the uninvolved left Baseline: 22% Goal status: INITIAL  5.  Noha will be able to walk 500 feet without a cane and manage 5 steps with subjective reports of no problem Baseline: Using a cane and has to use the handrail with 2 steps at home Goal status: INITIAL  6.  Stillman will be independent with his long-term home exercise program at discharge Baseline: Started 04/17/2022 Goal status: INITIAL   PLAN:  PT FREQUENCY:  3 times a week for 2 weeks then switch to twice a week  PT DURATION: 8 weeks  PLANNED INTERVENTIONS: Therapeutic exercises, Therapeutic activity, Neuromuscular re-education, Balance training, Gait training, Patient/Family education, Self Care, Joint mobilization, Stair training, Cryotherapy, Vasopneumatic device, and Manual therapy  PLAN FOR NEXT SESSION: Emphasis on AROM.  Quadriceps strengthening, balance and functional activities as appropriate.  Vaso to end.   Farley Ly, PT, MPT 04/17/2022, 4:25 PM

## 2022-04-20 NOTE — Therapy (Signed)
OUTPATIENT PHYSICAL THERAPY TREATMENT NOTE   Patient Name: Ricky Barton MRN: [id number] DOB:16-Nov-1955, 67 y.o., male Today's Date: 04/20/2022  PCP: Ricky Bush, MD  REFERRING PROVIDER: Leandrew Koyanagi, MD  END OF SESSION:   PT End of Session - 04/20/22 1257     Visit Number 2    Number of Visits 16    Date for PT Re-Evaluation 06/12/22    Authorization Type Ortho bundle    Authorization - Number of Visits 16    Progress Note Due on Visit 10    PT Start Time 9179    PT Stop Time 1505    PT Time Calculation (min) 58 min    Activity Tolerance Patient tolerated treatment well;No increased pain;Patient limited by pain    Behavior During Therapy [facility] for tasks assessed/performed             Past Medical History:  Diagnosis Date   Arthritis    bilateral knees   Hypoglycemic syndrome    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   COLONOSCOPY  07/2016   WNL, rpt 5 yrs Ricky Barton)   COLONOSCOPY  11/2021   2 HPs, rpt 7 yrs Ricky Barton)   Head MRI  08/2000   POLYPECTOMY  2013   TA"s x 3   TOTAL KNEE ARTHROPLASTY Left 05/12/2021   Procedure: LEFT TOTAL KNEE ARTHROPLASTY;  Surgeon: Ricky Koyanagi, MD;  Location: [place];  Service: Orthopedics;  Laterality: Left;   TOTAL KNEE ARTHROPLASTY Right 04/03/2022   Procedure: RIGHT TOTAL KNEE ARTHROPLASTY;  Surgeon: Ricky Koyanagi, MD;  Location: [place];  Service: Orthopedics;  Laterality: Right;   Patient Active Problem List   Diagnosis Date Noted   Status post total right knee replacement 04/03/2022   Primary osteoarthritis of right knee 02/04/2022   Primary osteoarthritis of left knee 05/12/2021    Status post total left knee replacement 05/12/2021   Allergic rhinitis 12/09/2020   Low serum vitamin B12 12/07/2020   Welcome to Medicare preventive visit 12/06/2020   Advanced directives, counseling/discussion 12/06/2020   Angular cheilitis 12/06/2020   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   Elevated blood pressure reading without diagnosis of hypertension 11/10/2018   Renal lesion 03/28/2018   Arthralgia of hands, bilateral 09/12/2015   OSA on CPAP 09/12/2015   Obesity, Class I, BMI 30-34.9 04/05/2014   Hyperglycemia 03/30/2013   Healthcare maintenance 03/04/2012   HLD (hyperlipidemia) 01/18/2009   ERECTILE DYSFUNCTION, 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  His right knee is not as swollen as left was at this point following surgery.   PAIN:  Are you having pain? Yes: NPRS scale: today 6/10 and since last PT  3-8/10 Pain location: Right knee Pain description: Ache, tight and can be sharp Aggravating factors: Prolonged postures and too much weightbearing Relieving factors: Ice, pain medication and exercise   OBJECTIVE: (objective measures completed at initial evaluation unless otherwise dated)   OBJECTIVE:    DIAGNOSTIC FINDINGS:  IMPRESSION: 1. Right total knee arthroplasty without evidence of acute postoperative complication.   PATIENT SURVEYS:  FOTO 47 (Goal 62 in 12 visits)   COGNITION: Overall cognitive status: Within functional limits for tasks assessed                         SENSATION: No tingling or paresthesias   EDEMA:  Noted and not objectively assessed     LOWER EXTREMITY ROM:   ROM P:passive  A:active Right 04/17/22 Left 04/17/22 Right 04/20/22  Hip flexion       Hip extension       Hip abduction       Hip adduction       Hip internal rotation       Hip external rotation       Knee flexion A: 68 A; 106 Seated P: 85* A: 80*  Knee extension A: -13 A: -2 Seated P: -5* A: quad set -7*  Ankle dorsiflexion       Ankle plantarflexion       Ankle inversion       Ankle eversion        (Blank rows = not tested)   LOWER EXTREMITY STRENGTH:   MMT Right 04/17/2022 Left 04/17/2022  Hip flexion      Hip extension      Hip abduction      Hip adduction      Hip internal rotation      Hip external rotation      Knee flexion      Knee extension 19.3 pounds 87.2 pounds  Ankle dorsiflexion      Ankle plantarflexion      Ankle inversion      Ankle eversion       (Blank rows = not tested)   GAIT: Distance walked: Within the clinic Assistive device utilized: Single point cane Level of assistance: Complete Independence Comments: Ricky Barton would like to return to walking without the cane     TODAY'S TREATMENT:                                                                                                                              DATE:  04/20/2022: Therapeutic Exercise: Nustep seat 10 level 5 BLEs & BUEs working on range for 8 min. Gastroc stretch heel depression on step 30 sec hold 2 reps BLEs Heel raises 15  reps with light BUE support RLE Heel slides seated foot on pillow case to reduce resistance: quad set knee ext 5 sec hold, active knee flexion max range, then LLE assisted Rt knee  flexion, RLE isometric hamstring set 5 sec. 10 reps 2 sets. Supine heel slide on red 55cm ball with strap flex & ext AA 15 reps  Therapeutic Activities Sit to / from stand using BLEs pushing with BUEs on armrests 10 reps with PT cues on technique. Pt amb with single point cane with antalgic patttern with cues for terminal stance & swing flexion   Manual Therapy: PROM with overpressure for flexion & extension Contract relax for flexion  Vaso to Right knee medium compression 34* for 10 min with elevation   04/17/2022  Quadriceps sets with right heel prop 2 sets of 10 for 5 seconds Tailgate knee flexion 1 minute Right knee flexion AAROM (left pushes right into flexion) 10 x 10 seconds Prone knee extension stretch with rolled up towels above both knees 3 minutes   PATIENT EDUCATION:  Education details: Reviewed exam findings and home exercises Person educated: Patient and Spouse Education method: Explanation, Demonstration, Tactile cues, Verbal cues, and Handouts Education comprehension: verbalized understanding, returned demonstration, verbal cues required, tactile cues required, and needs further education   HOME EXERCISE PROGRAM: Access Code: [email or web address] URL: [email or web address] Date: 04/17/2022 Prepared by: Vista Mink   Exercises - Supine Quadricep Sets  - 5 x daily - 7 x weekly - 2 sets - 10 reps - 5 second hold - Seated Knee Flexion AAROM  - 3-5 x daily - 7 x weekly - 1 sets - 1 reps - 3 minutes hold - Prone Knee Extension with Ankle Weight  - 2 x daily - 7 x weekly - 3-5 minutes hold   ASSESSMENT:  CLINICAL IMPRESSION: Patient had better range today following manual therapy & exercise.  He responds well to PT activities.  He had better functional range with gait exiting clinic.    OBJECTIVE IMPAIRMENTS: Abnormal gait, decreased activity tolerance, decreased balance, decreased endurance, and decreased knowledge of condition.    ACTIVITY LIMITATIONS:  carrying, bending, sitting, standing, squatting, sleeping, stairs, and locomotion level   PARTICIPATION LIMITATIONS: driving, community activity, and yard work   PERSONAL FACTORS: Bil knee OA, Lt ear hearing loss, h/o seizures, Lt TKA 05/12/2021, HLD are also affecting patient's functional outcome.    REHAB POTENTIAL: Good   CLINICAL DECISION MAKING: Stable/uncomplicated   EVALUATION COMPLEXITY: Low     GOALS: Goals reviewed with patient? Yes   SHORT TERM GOALS: Target date: 05/15/2022 Improve right knee AROM for extension to -5 and flexion to 90 degrees Baseline: 13-0-68 degrees Goal status: INITIAL   2.  Greggory will be independent with his day 1 home exercise program Baseline: Started 04/17/2022 Goal status: INITIAL     LONG TERM GOALS: Target date: 06/12/2022   Improve FOTO to 62 Baseline: 47 Goal status: INITIAL   2.  Ayven will report right knee pain consistently 0-3 out of 10 on the visual analog scale Baseline: 3-7 out of 10 Goal status: INITIAL   3.  Improve right knee AROM to 2-0-100 Baseline: 13-0-68 Goal status: INITIAL   4.  Improve right quadriceps strength to at least 60% of the uninvolved left Baseline: 22% Goal status: INITIAL   5.  Pascual will be able to walk 500 feet without a cane and manage 5 steps with subjective reports of no problem Baseline: Using a cane and has to use the  handrail with 2 steps at home Goal status: INITIAL   6.  Romel will be independent with his long-term home exercise program at discharge Baseline: Started 04/17/2022 Goal status: INITIAL     Barton:   PT FREQUENCY:  3 times a week for 2 weeks then switch to twice a week   PT DURATION: 8 weeks   PLANNED INTERVENTIONS: Therapeutic exercises, Therapeutic activity, Neuromuscular re-education, Balance training, Gait training, Patient/Family education, Self Care, Joint mobilization, Stair training, Cryotherapy, Vasopneumatic device, and Manual therapy   Barton FOR NEXT  SESSION: Manual therapy & exercises for range & function.  Quadriceps strengthening, balance and functional activities as appropriate.  Vaso to end.     Jamey Reas, PT, DPT 04/20/2022, 2:02 PM

## 2022-04-21 ENCOUNTER — Ambulatory Visit: Payer: PPO | Admitting: Physical Therapy

## 2022-04-21 ENCOUNTER — Encounter: Payer: Self-pay | Admitting: Physical Therapy

## 2022-04-21 ENCOUNTER — Telehealth: Payer: Self-pay | Admitting: *Deleted

## 2022-04-21 DIAGNOSIS — M25561 Pain in right knee: Secondary | ICD-10-CM

## 2022-04-21 DIAGNOSIS — M25661 Stiffness of right knee, not elsewhere classified: Secondary | ICD-10-CM | POA: Diagnosis not present

## 2022-04-21 DIAGNOSIS — M6281 Muscle weakness (generalized): Secondary | ICD-10-CM

## 2022-04-21 DIAGNOSIS — R262 Difficulty in walking, not elsewhere classified: Secondary | ICD-10-CM

## 2022-04-21 DIAGNOSIS — R600 Localized edema: Secondary | ICD-10-CM | POA: Diagnosis not present

## 2022-04-21 NOTE — Telephone Encounter (Signed)
Patient called requesting refill of pain medication. Pharmacy on chart. Thanks.

## 2022-04-21 NOTE — Therapy (Signed)
OUTPATIENT PHYSICAL THERAPY TREATMENT NOTE   Patient Name: Ricky Barton MRN: [id number] DOB:05/18/1955, 67 y.o., male Today's Date: 04/21/2022  PCP: Ria Bush, MD  REFERRING PROVIDER: Leandrew Koyanagi, MD  END OF SESSION:   PT End of Session - 04/21/22 1258     Visit Number 3    Number of Visits 16    Date for PT Re-Evaluation 06/12/22    Authorization Type Ortho bundle    Authorization - Number of Visits 16    Progress Note Due on Visit 10    PT Start Time 6283    PT Stop Time 6629    PT Time Calculation (min) 55 min    Activity Tolerance Patient tolerated treatment well;No increased pain;Patient limited by pain    Behavior During Therapy [facility] for tasks assessed/performed              Past Medical History:  Diagnosis Date   Arthritis    bilateral knees   Hypoglycemic syndrome    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   COLONOSCOPY  07/2016   WNL, rpt 5 yrs Fuller Plan)   COLONOSCOPY  11/2021   2 HPs, rpt 7 yrs Fuller Plan)   Head MRI  08/2000   POLYPECTOMY  2013   TA"s x 3   TOTAL KNEE ARTHROPLASTY Left 05/12/2021   Procedure: LEFT TOTAL KNEE ARTHROPLASTY;  Surgeon: Leandrew Koyanagi, MD;  Location: [place];  Service: Orthopedics;  Laterality: Left;   TOTAL KNEE ARTHROPLASTY Right 04/03/2022   Procedure: RIGHT TOTAL KNEE ARTHROPLASTY;  Surgeon: Leandrew Koyanagi, MD;  Location: [place];  Service: Orthopedics;  Laterality: Right;   Patient Active Problem List   Diagnosis Date Noted   Status post total right knee replacement 04/03/2022   Primary osteoarthritis of right knee 02/04/2022   Primary osteoarthritis of left knee 05/12/2021    Status post total left knee replacement 05/12/2021   Allergic rhinitis 12/09/2020   Low serum vitamin B12 12/07/2020   Welcome to Medicare preventive visit 12/06/2020   Advanced directives, counseling/discussion 12/06/2020   Angular cheilitis 12/06/2020   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   Elevated blood pressure reading without diagnosis of hypertension 11/10/2018   Renal lesion 03/28/2018   Arthralgia of hands, bilateral 09/12/2015   OSA on CPAP 09/12/2015   Obesity, Class I, BMI 30-34.9 04/05/2014   Hyperglycemia 03/30/2013   Healthcare maintenance 03/04/2012   HLD (hyperlipidemia) 01/18/2009   ERECTILE DYSFUNCTION, 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     PAIN:  Are you having pain? Yes: NPRS scale: today 6-7/10 and since last PT  3-8/10 Pain location: Right knee Pain description: Ache, tight and can be sharp Aggravating factors: Prolonged postures and too much weightbearing Relieving factors: Ice, pain medication and exercise   OBJECTIVE: (objective measures completed at initial evaluation unless otherwise dated)  OBJECTIVE:    DIAGNOSTIC FINDINGS: IMPRESSION: 1. Right total knee arthroplasty without evidence of  acute postoperative complication.   PATIENT SURVEYS:  FOTO 47 (Goal 62 in 12 visits)   COGNITION: Overall cognitive status: Within functional limits for tasks assessed                         SENSATION: No tingling or paresthesias   EDEMA:  Noted and not objectively assessed     LOWER EXTREMITY ROM:   ROM P:passive  A:active Right 04/17/22 Left 04/17/22 Right 04/20/22  Hip flexion       Hip extension       Hip abduction       Hip adduction       Hip internal rotation       Hip external rotation       Knee flexion A: 68 A; 106 Seated P: 85* A: 80*  Knee extension A: -13 A: -2 Seated P: -5* A: quad set -7*  Ankle dorsiflexion       Ankle plantarflexion       Ankle inversion       Ankle eversion        (Blank rows = not tested)   LOWER EXTREMITY STRENGTH:   MMT Right 04/17/2022 Left 04/17/2022  Hip flexion      Hip extension      Hip abduction      Hip adduction      Hip internal rotation      Hip external rotation      Knee flexion      Knee extension 19.3 pounds 87.2 pounds  Ankle dorsiflexion      Ankle plantarflexion      Ankle inversion      Ankle eversion       (Blank rows = not tested)   GAIT: Distance walked: Within the clinic Assistive device utilized: Single point cane Level of assistance: Complete Independence Comments: Axl would like to return to walking without the cane     TODAY'S TREATMENT:                                                                                                                              DATE:  04/21/2022: Therapeutic Exercise: SciFit bike seat 12 level 1 BLEs & BUEs 6 min then LEs only 2 min for 8 min total. Gastroc stretch BLEs on incline board 30 sec hold 2 reps BLEs Heel raises on incline board 15 reps with light BUE support Leg press  BLEs 81# 10 reps with 5 sec hold ext & flex,  Hamstring stretch RLE SLR strap with PT assist 30 sec hold 2 reps SAQ supine RLE 10 reps 2 sets Quad stretch supine RLE over edge  with strap knee flex 30 sec hold 2 reps Seated LAQ & active knee flexion with contralateral LE opposing motion AA from PT for right knee 15 reps.   Manual Therapy: PROM with overpressure for flexion & extension Contract relax for flexion  Vaso to Right knee medium compression 34* for 10 min with elevation intermittent ankle A-Z for muscle activity  04/20/2022: Therapeutic Exercise: Nustep seat 10 level 5 BLEs & BUEs working on range for 8 min. Gastroc stretch heel depression on step 30 sec hold 2 reps BLEs Heel raises 15 reps with light BUE support RLE Heel slides seated foot on pillow case to reduce resistance: quad set knee ext 5 sec hold, active knee flexion max range, then LLE assisted Rt knee flexion, RLE isometric hamstring set 5 sec. 10 reps 2 sets. Supine heel slide on red 55cm ball with strap flex & ext AA 15 reps  Therapeutic Activities Sit to / from stand using BLEs pushing with BUEs on armrests 10 reps with PT cues on technique. Pt amb with single point cane with antalgic patttern with cues for terminal stance & swing flexion   Manual Therapy: PROM with overpressure for flexion & extension Contract relax for flexion  Vaso to Right knee medium compression 34* for 10 min with elevation   04/17/2022  Quadriceps sets with right heel prop 2 sets of 10 for 5 seconds Tailgate knee flexion 1 minute Right knee flexion AAROM (left pushes right into flexion) 10 x 10 seconds Prone knee extension stretch with rolled up towels above both knees 3 minutes   PATIENT EDUCATION:  Education details: Reviewed exam findings and home exercises Person educated: Patient and Spouse Education method: Explanation, Demonstration, Tactile cues, Verbal cues, and Handouts Education comprehension: verbalized understanding, returned demonstration, verbal cues required, tactile cues required, and needs further education   HOME EXERCISE PROGRAM: Access Code: [email or web address] URL:  [email or web address] Date: 04/17/2022 Prepared by: Vista Mink   Exercises - Supine Quadricep Sets  - 5 x daily - 7 x weekly - 2 sets - 10 reps - 5 second hold - Seated Knee Flexion AAROM  - 3-5 x daily - 7 x weekly - 1 sets - 1 reps - 3 minutes hold - Prone Knee Extension with Ankle Weight  - 2 x daily - 7 x weekly - 3-5 minutes hold   ASSESSMENT:  CLINICAL IMPRESSION: Patient is tolerating light functional exercises today with progressive range.  Pt continues to have edema, pain, strength, muscle tightness & joint tightness limiting knee motions.    OBJECTIVE IMPAIRMENTS: Abnormal gait, decreased activity tolerance, decreased balance, decreased endurance, and decreased knowledge of condition.    ACTIVITY LIMITATIONS: carrying, bending, sitting, standing, squatting, sleeping, stairs, and locomotion level   PARTICIPATION LIMITATIONS: driving, community activity, and yard work   PERSONAL FACTORS: Bil knee OA, Lt ear hearing loss, h/o seizures, Lt TKA 05/12/2021, HLD are also affecting patient's functional outcome.    REHAB POTENTIAL: Good   CLINICAL DECISION MAKING: Stable/uncomplicated   EVALUATION COMPLEXITY: Low     GOALS: Goals reviewed with patient? Yes   SHORT TERM GOALS: Target date: 05/15/2022 Improve right knee AROM for extension to -5 and flexion to 90 degrees Baseline: 13-0-68 degrees Goal status: INITIAL   2.  Sanjit will be independent  with his day 1 home exercise program Baseline: Started 04/17/2022 Goal status: INITIAL     LONG TERM GOALS: Target date: 06/12/2022   Improve FOTO to 62 Baseline: 47 Goal status: INITIAL   2.  Deven will report right knee pain consistently 0-3 out of 10 on the visual analog scale Baseline: 3-7 out of 10 Goal status: INITIAL   3.  Improve right knee AROM to 2-0-100 Baseline: 13-0-68 Goal status: INITIAL   4.  Improve right quadriceps strength to at least 60% of the uninvolved left Baseline: 22% Goal  status: INITIAL   5.  Alhaji will be able to walk 500 feet without a cane and manage 5 steps with subjective reports of no problem Baseline: Using a cane and has to use the handrail with 2 steps at home Goal status: INITIAL   6.  Leamon will be independent with his long-term home exercise program at discharge Baseline: Started 04/17/2022 Goal status: INITIAL     PLAN:   PT FREQUENCY:  3 times a week for 2 weeks then switch to twice a week   PT DURATION: 8 weeks   PLANNED INTERVENTIONS: Therapeutic exercises, Therapeutic activity, Neuromuscular re-education, Balance training, Gait training, Patient/Family education, Self Care, Joint mobilization, Stair training, Cryotherapy, Vasopneumatic device, and Manual therapy   PLAN FOR NEXT SESSION: continue Manual therapy & exercises for range & function.  Quadriceps strengthening, balance and functional activities as appropriate.  Vaso to end.  Jamey Reas, PT, DPT 04/21/2022, 2:48 PM

## 2022-04-22 ENCOUNTER — Other Ambulatory Visit: Payer: Self-pay | Admitting: Physician Assistant

## 2022-04-22 ENCOUNTER — Encounter: Payer: PPO | Admitting: Physical Therapy

## 2022-04-22 MED ORDER — OXYCODONE-ACETAMINOPHEN 5-325 MG PO TABS: 1.0000 | ORAL_TABLET | Freq: Three times a day (TID) | ORAL | 0 refills | Status: DC | PRN

## 2022-04-22 NOTE — Telephone Encounter (Signed)
sent

## 2022-04-23 ENCOUNTER — Encounter: Payer: Self-pay | Admitting: Physical Therapy

## 2022-04-23 ENCOUNTER — Encounter: Payer: PPO | Admitting: Physical Therapy

## 2022-04-23 ENCOUNTER — Ambulatory Visit: Payer: PPO | Admitting: Physical Therapy

## 2022-04-23 DIAGNOSIS — M25561 Pain in right knee: Secondary | ICD-10-CM | POA: Diagnosis not present

## 2022-04-23 DIAGNOSIS — R262 Difficulty in walking, not elsewhere classified: Secondary | ICD-10-CM | POA: Diagnosis not present

## 2022-04-23 DIAGNOSIS — R600 Localized edema: Secondary | ICD-10-CM

## 2022-04-23 DIAGNOSIS — M6281 Muscle weakness (generalized): Secondary | ICD-10-CM

## 2022-04-23 DIAGNOSIS — M25661 Stiffness of right knee, not elsewhere classified: Secondary | ICD-10-CM | POA: Diagnosis not present

## 2022-04-23 NOTE — Therapy (Signed)
OUTPATIENT PHYSICAL THERAPY TREATMENT NOTE   Patient Name: Ricky Barton MRN: [id number] DOB:1955-08-12, 67 y.o., male Today's Date: 04/23/2022  PCP: Ria Bush, MD  REFERRING PROVIDER: Leandrew Koyanagi, MD  END OF SESSION:   PT End of Session - 04/23/22 1304     Visit Number 4    Number of Visits 16    Date for PT Re-Evaluation 06/12/22    Authorization Type Ortho bundle    Authorization - Number of Visits 16    Progress Note Due on Visit 10    PT Start Time 1300    PT Stop Time 1340    PT Time Calculation (min) 40 min    Activity Tolerance Patient tolerated treatment well;No increased pain;Patient limited by pain    Behavior During Therapy [facility] for tasks assessed/performed               Past Medical History:  Diagnosis Date   Arthritis    bilateral knees   Hypoglycemic syndrome    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   COLONOSCOPY  07/2016   WNL, rpt 5 yrs Fuller Plan)   COLONOSCOPY  11/2021   2 HPs, rpt 7 yrs Fuller Plan)   Head MRI  08/2000   POLYPECTOMY  2013   TA"s x 3   TOTAL KNEE ARTHROPLASTY Left 05/12/2021   Procedure: LEFT TOTAL KNEE ARTHROPLASTY;  Surgeon: Leandrew Koyanagi, MD;  Location: [place];  Service: Orthopedics;  Laterality: Left;   TOTAL KNEE ARTHROPLASTY Right 04/03/2022   Procedure: RIGHT TOTAL KNEE ARTHROPLASTY;  Surgeon: Leandrew Koyanagi, MD;  Location: [place];  Service: Orthopedics;  Laterality: Right;   Patient Active Problem List   Diagnosis Date Noted   Status post total right knee replacement 04/03/2022   Primary osteoarthritis of right knee 02/04/2022   Primary osteoarthritis of left knee  05/12/2021   Status post total left knee replacement 05/12/2021   Allergic rhinitis 12/09/2020   Low serum vitamin B12 12/07/2020   Welcome to Medicare preventive visit 12/06/2020   Advanced directives, counseling/discussion 12/06/2020   Angular cheilitis 12/06/2020   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   Elevated blood pressure reading without diagnosis of hypertension 11/10/2018   Renal lesion 03/28/2018   Arthralgia of hands, bilateral 09/12/2015   OSA on CPAP 09/12/2015   Obesity, Class I, BMI 30-34.9 04/05/2014   Hyperglycemia 03/30/2013   Healthcare maintenance 03/04/2012   HLD (hyperlipidemia) 01/18/2009   ERECTILE DYSFUNCTION, 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   History of seizure 08/15/1991    REFERRING DIAG: M17.11 (ICD-10-CM) - Primary osteoarthritis of right knee   ONSET DATE: R knee TKA 04/03/2022   THERAPY DIAG:  Muscle weakness (generalized)  Difficulty in walking, not elsewhere classified  Localized edema  Stiffness of right knee, not elsewhere classified  Right knee pain, unspecified chronicity  Rationale for Evaluation and Treatment Rehabilitation  PERTINENT HISTORY: Bil knee OA, Lt ear hearing loss, h/o seizures, Lt TKA 05/12/2021, HLD   PRECAUTIONS: None  SUBJECTIVE:  SUBJECTIVE STATEMENT: Rt hip flexor is more sore today, otherwise knee is doing well   PAIN:  Are you having pain? Yes: NPRS scale: today 6 currently/10 and since last PT  3-8/10 Pain location: Right knee Pain description: Ache, tight and can be sharp Aggravating factors: Prolonged postures and too much weightbearing Relieving factors: Ice, pain medication and exercise   OBJECTIVE: (objective measures completed at initial evaluation unless otherwise dated)  OBJECTIVE:    DIAGNOSTIC FINDINGS: IMPRESSION: 1. Right total  knee arthroplasty without evidence of acute postoperative complication.   PATIENT SURVEYS:  EVAL: FOTO 47 (Goal 62 in 12 visits)   EDEMA:  Noted and not objectively assessed   LOWER EXTREMITY ROM:   ROM P:passive  A:active Right 04/17/22 Left 04/17/22 Right 04/20/22 Right 04/23/22  Knee flexion A: 68 A; 106 Seated P: 85* A: 80* Supine AA: 93  Knee extension A: -13 A: -2 Seated P: -5* A: quad set -7*    (Blank rows = not tested)   LOWER EXTREMITY STRENGTH:   MMT Right 04/17/2022 Left 04/17/2022  Knee flexion      Knee extension 19.3 pounds 87.2 pounds   (Blank rows = not tested)   GAIT: Distance walked: Within the clinic Assistive device utilized: Single point cane Level of assistance: Complete Independence Comments: Valon would like to return to walking without the cane     TODAY'S TREATMENT:                                                                                                                              DATE:  04/23/22 Therapeutic Exercise: SciFit bike seat 12 level 1 x 8 min AA knee flexion seated on Rt 10 x 10 sec; LLE providing overpressure Supine AA heel slide x 15 reps Long sitting quad sets x 15 reps bil Seated Rt LAQ 4# 2x10; 3 sec hold Sit to/from stand 2x10; without UE support Leg Press 81# 2x10  Manual Seated knee flexion PROM to tolerance  04/21/2022: Therapeutic Exercise: SciFit bike seat 12 level 1 BLEs & BUEs 6 min then LEs only 2 min for 8 min total. Gastroc stretch BLEs on incline board 30 sec hold 2 reps BLEs Heel raises on incline board 15 reps with light BUE support Leg press BLEs 81# 10 reps with 5 sec hold ext & flex,  Hamstring stretch RLE SLR strap with PT assist 30 sec hold 2 reps SAQ supine RLE 10 reps 2 sets Quad stretch supine RLE over edge with strap knee flex 30 sec hold 2 reps Seated LAQ & active knee flexion with contralateral LE opposing motion AA from PT for right knee 15 reps.   Manual Therapy: PROM with  overpressure for flexion & extension Contract relax for flexion  Vaso to Right knee medium compression 34* for 10 min with elevation intermittent ankle A-Z for muscle activity  04/20/2022: Therapeutic Exercise: Nustep seat 10 level 5 BLEs & BUEs working on range for  8 min. Gastroc stretch heel depression on step 30 sec hold 2 reps BLEs Heel raises 15 reps with light BUE support RLE Heel slides seated foot on pillow case to reduce resistance: quad set knee ext 5 sec hold, active knee flexion max range, then LLE assisted Rt knee flexion, RLE isometric hamstring set 5 sec. 10 reps 2 sets. Supine heel slide on red 55cm ball with strap flex & ext AA 15 reps  Therapeutic Activities Sit to / from stand using BLEs pushing with BUEs on armrests 10 reps with PT cues on technique. Pt amb with single point cane with antalgic patttern with cues for terminal stance & swing flexion   Manual Therapy: PROM with overpressure for flexion & extension Contract relax for flexion  Vaso to Right knee medium compression 34* for 10 min with elevation   04/17/2022  Quadriceps sets with right heel prop 2 sets of 10 for 5 seconds Tailgate knee flexion 1 minute Right knee flexion AAROM (left pushes right into flexion) 10 x 10 seconds Prone knee extension stretch with rolled up towels above both knees 3 minutes   PATIENT EDUCATION:  Education details: Reviewed exam findings and home exercises Person educated: Patient and Spouse Education method: Explanation, Demonstration, Tactile cues, Verbal cues, and Handouts Education comprehension: verbalized understanding, returned demonstration, verbal cues required, tactile cues required, and needs further education   HOME EXERCISE PROGRAM: Access Code: [email or web address] URL: [email or web address] Date: 04/17/2022 Prepared by: Vista Mink   Exercises - Supine Quadricep Sets  - 5 x daily - 7 x weekly - 2 sets - 10 reps - 5 second hold - Seated Knee Flexion  AAROM  - 3-5 x daily - 7 x weekly - 1 sets - 1 reps - 3 minutes hold - Prone Knee Extension with Ankle Weight  - 2 x daily - 7 x weekly - 3-5 minutes hold   ASSESSMENT:  CLINICAL IMPRESSION: Pt tolerated session well today with continued work on strengthening and ROM.  Will continue to benefit from PT to maximize function.   OBJECTIVE IMPAIRMENTS: Abnormal gait, decreased activity tolerance, decreased balance, decreased endurance, and decreased knowledge of condition.    ACTIVITY LIMITATIONS: carrying, bending, sitting, standing, squatting, sleeping, stairs, and locomotion level   PARTICIPATION LIMITATIONS: driving, community activity, and yard work   PERSONAL FACTORS: Bil knee OA, Lt ear hearing loss, h/o seizures, Lt TKA 05/12/2021, HLD are also affecting patient's functional outcome.    REHAB POTENTIAL: Good   CLINICAL DECISION MAKING: Stable/uncomplicated   EVALUATION COMPLEXITY: Low     GOALS: Goals reviewed with patient? Yes   SHORT TERM GOALS: Target date: 05/15/2022 Improve right knee AROM for extension to -5 and flexion to 90 degrees Baseline: 13-0-68 degrees Goal status: INITIAL   2.  Azaiah will be independent with his day 1 home exercise program Baseline: Started 04/17/2022 Goal status: INITIAL     LONG TERM GOALS: Target date: 06/12/2022   Improve FOTO to 62 Baseline: 47 Goal status: INITIAL   2.  Chesky will report right knee pain consistently 0-3 out of 10 on the visual analog scale Baseline: 3-7 out of 10 Goal status: INITIAL   3.  Improve right knee AROM to 2-0-100 Baseline: 13-0-68 Goal status: INITIAL   4.  Improve right quadriceps strength to at least 60% of the uninvolved left Baseline: 22% Goal status: INITIAL   5.  Shahan will be able to walk 500 feet without a cane and manage 5 steps with subjective reports of no  problem Baseline: Using a cane and has to use the handrail with 2 steps at home Goal status: INITIAL   6.  Claudell will be  independent with his long-term home exercise program at discharge Baseline: Started 04/17/2022 Goal status: INITIAL     PLAN:   PT FREQUENCY:  3 times a week for 2 weeks then switch to twice a week   PT DURATION: 8 weeks   PLANNED INTERVENTIONS: Therapeutic exercises, Therapeutic activity, Neuromuscular re-education, Balance training, Gait training, Patient/Family education, Self Care, Joint mobilization, Stair training, Cryotherapy, Vasopneumatic device, and Manual therapy   PLAN FOR NEXT SESSION: continue Manual therapy & exercises for range & function.  Quadriceps strengthening, balance and functional activities as appropriate.  Vaso to end.  Faustino Congress, PT, DPT 04/23/2022, 1:42 PM

## 2022-04-24 ENCOUNTER — Ambulatory Visit: Payer: PPO | Admitting: Orthopaedic Surgery

## 2022-04-25 DIAGNOSIS — G4733 Obstructive sleep apnea (adult) (pediatric): Secondary | ICD-10-CM | POA: Diagnosis not present

## 2022-04-28 ENCOUNTER — Encounter: Payer: Self-pay | Admitting: Rehabilitative and Restorative Service Providers"

## 2022-04-28 ENCOUNTER — Ambulatory Visit: Payer: PPO | Admitting: Rehabilitative and Restorative Service Providers"

## 2022-04-28 DIAGNOSIS — R262 Difficulty in walking, not elsewhere classified: Secondary | ICD-10-CM

## 2022-04-28 DIAGNOSIS — M6281 Muscle weakness (generalized): Secondary | ICD-10-CM

## 2022-04-28 DIAGNOSIS — M25561 Pain in right knee: Secondary | ICD-10-CM

## 2022-04-28 DIAGNOSIS — R600 Localized edema: Secondary | ICD-10-CM | POA: Diagnosis not present

## 2022-04-28 DIAGNOSIS — M25661 Stiffness of right knee, not elsewhere classified: Secondary | ICD-10-CM

## 2022-04-28 NOTE — Therapy (Signed)
OUTPATIENT PHYSICAL THERAPY TREATMENT NOTE   Patient Name: Ricky Barton MRN: [id number] DOB:30-Nov-1955, 67 y.o., male Today's Date: 04/28/2022  PCP: Ria Bush, MD  REFERRING PROVIDER: Leandrew Koyanagi, MD  END OF SESSION:   PT End of Session - 04/28/22 1344     Visit Number 5    Number of Visits 16    Date for PT Re-Evaluation 06/12/22    Authorization Type Ortho bundle    Authorization - Number of Visits 16    Progress Note Due on Visit 10    PT Start Time 1344    PT Stop Time 1435    PT Time Calculation (min) 51 min    Activity Tolerance Patient tolerated treatment well;No increased pain;Patient limited by pain    Behavior During Therapy [facility] for tasks assessed/performed                Past Medical History:  Diagnosis Date   Arthritis    bilateral knees   Hypoglycemic syndrome    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   COLONOSCOPY  07/2016   WNL, rpt 5 yrs Fuller Plan)   COLONOSCOPY  11/2021   2 HPs, rpt 7 yrs Fuller Plan)   Head MRI  08/2000   POLYPECTOMY  2013   TA"s x 3   TOTAL KNEE ARTHROPLASTY Left 05/12/2021   Procedure: LEFT TOTAL KNEE ARTHROPLASTY;  Surgeon: Leandrew Koyanagi, MD;  Location: [place];  Service: Orthopedics;  Laterality: Left;   TOTAL KNEE ARTHROPLASTY Right 04/03/2022   Procedure: RIGHT TOTAL KNEE ARTHROPLASTY;  Surgeon: Leandrew Koyanagi, MD;  Location: [place];  Service: Orthopedics;  Laterality: Right;   Patient Active Problem List   Diagnosis Date Noted   Status post total right knee replacement 04/03/2022   Primary osteoarthritis of right knee 02/04/2022   Primary osteoarthritis of left knee  05/12/2021   Status post total left knee replacement 05/12/2021   Allergic rhinitis 12/09/2020   Low serum vitamin B12 12/07/2020   Welcome to Medicare preventive visit 12/06/2020   Advanced directives, counseling/discussion 12/06/2020   Angular cheilitis 12/06/2020   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   Elevated blood pressure reading without diagnosis of hypertension 11/10/2018   Renal lesion 03/28/2018   Arthralgia of hands, bilateral 09/12/2015   OSA on CPAP 09/12/2015   Obesity, Class I, BMI 30-34.9 04/05/2014   Hyperglycemia 03/30/2013   Healthcare maintenance 03/04/2012   HLD (hyperlipidemia) 01/18/2009   ERECTILE DYSFUNCTION, 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  He is using his cane less often and forgot it today.  PAIN:  Are you having pain? Yes: NPRS scale: 3-8/10 this week Pain location: Right knee Pain description: Ache, tight and can be sharp Aggravating factors: Prolonged postures and too much weightbearing Relieving factors: Ice, pain medication and exercise   OBJECTIVE: (objective measures completed at initial evaluation unless otherwise dated)  OBJECTIVE:    DIAGNOSTIC FINDINGS: IMPRESSION: 1. Right total  knee arthroplasty without evidence of acute postoperative complication.   PATIENT SURVEYS:  EVAL: FOTO 47 (Goal 62 in 12 visits)   EDEMA:  Noted and not objectively assessed   LOWER EXTREMITY ROM:   ROM P:passive  A:active Right 04/17/22 Left 04/17/22 Right 04/20/22 Right 04/23/22 Right 04/28/2022  Knee flexion A: 68 A; 106 Seated P: 85* A: 80* Supine AA: 93 Active 95  Knee extension A: -13 A: -2 Seated P: -5* A: quad set -7*  Active -5   (Blank rows = not tested)   LOWER EXTREMITY STRENGTH:   MMT Right 04/17/2022 Left 04/17/2022  Knee flexion      Knee extension 19.3 pounds 87.2 pounds   (Blank rows = not tested)   GAIT: Distance walked: Within the clinic Assistive device utilized: Single point cane Level of assistance: Complete Independence Comments: Ricky Barton would like to return to walking without the cane     TODAY'S TREATMENT:                                                                                                                              DATE:  04/28/2022 SciFit bike seat 12 level 4.5 x 3 min Knee flexion with strap supine 10X 10 seconds Seated knee flexion (left pushes right into flexion) 10X 10 seconds Quadriceps sets with heel prop 2 sets of 10 for 5 seconds Seated straight leg raises 2 sets of 5 for 3 seconds (avoid quad lag)  Functional Activities: Double Leg Press 81# 15X slow eccentrics and full extension  Single Leg Press 37# 10X slow eccentrics and full extension  Vaso right knee Medium 34* 10 minutes   04/23/22 Therapeutic Exercise: SciFit bike seat 12 level 1 x 8 min AA knee flexion seated on Rt 10 x 10 sec; LLE providing overpressure Supine AA heel slide x 15 reps Long sitting quad sets x 15 reps bil Seated Rt LAQ 4# 2x10; 3 sec hold Sit to/from stand 2x10; without UE support Leg Press 81# 2x10  Manual Seated knee flexion PROM to tolerance   04/21/2022: Therapeutic Exercise: SciFit bike seat 12 level 1 BLEs & BUEs 6 min then LEs only  2 min for 8 min total. Gastroc stretch BLEs on incline board 30 sec hold 2 reps BLEs Heel raises on incline board 15 reps with light BUE support Leg press BLEs 81# 10 reps with 5 sec hold ext & flex,  Hamstring stretch RLE SLR strap with PT assist 30 sec  hold 2 reps SAQ supine RLE 10 reps 2 sets Quad stretch supine RLE over edge with strap knee flex 30 sec hold 2 reps Seated LAQ & active knee flexion with contralateral LE opposing motion AA from PT for right knee 15 reps.   Manual Therapy: PROM with overpressure for flexion & extension Contract relax for flexion  Vaso to Right knee medium compression 34* for 10 min with elevation intermittent ankle A-Z for muscle activity     PATIENT EDUCATION:  Education details: Reviewed exam findings and home exercises Person educated: Patient and Spouse Education method: Explanation, Demonstration, Tactile cues, Verbal cues, and Handouts Education comprehension: verbalized understanding, returned demonstration, verbal cues required, tactile cues required, and needs further education   HOME EXERCISE PROGRAM: Access Code: [email or web address] URL: [email or web address] Date: 04/17/2022 Prepared by: Vista Mink   Exercises - Supine Quadricep Sets  - 5 x daily - 7 x weekly - 2 sets - 10 reps - 5 second hold - Seated Knee Flexion AAROM  - 3-5 x daily - 7 x weekly - 1 sets - 1 reps - 3 minutes hold - Prone Knee Extension with Ankle Weight  - 2 x daily - 7 x weekly - 3-5 minutes hold   ASSESSMENT:  CLINICAL IMPRESSION: AROM was 5-0-95 today (13-0-68 at evaluation).  Active range of motion and quadricep strength remain the early emphasis and we will transition into more balance and functional activities as active range of motion continues to improve.   OBJECTIVE IMPAIRMENTS: Abnormal gait, decreased activity tolerance, decreased balance, decreased endurance, and decreased knowledge of condition.    ACTIVITY LIMITATIONS: carrying, bending,  sitting, standing, squatting, sleeping, stairs, and locomotion level   PARTICIPATION LIMITATIONS: driving, community activity, and yard work   PERSONAL FACTORS: Bil knee OA, Lt ear hearing loss, h/o seizures, Lt TKA 05/12/2021, HLD are also affecting patient's functional outcome.    REHAB POTENTIAL: Good   CLINICAL DECISION MAKING: Stable/uncomplicated   EVALUATION COMPLEXITY: Low     GOALS: Goals reviewed with patient? Yes   SHORT TERM GOALS: Target date: 05/15/2022 Improve right knee AROM for extension to -5 and flexion to 90 degrees Baseline: 13-0-68 degrees Goal status: Partially Met 04/28/2022   2.  Ricky Barton will be independent with his day 1 home exercise program Baseline: Started 04/17/2022 Goal status: Met 04/28/2022     LONG TERM GOALS: Target date: 06/12/2022   Improve FOTO to 62 Baseline: 47 Goal status: INITIAL   2.  Ricky Barton will report right knee pain consistently 0-3 out of 10 on the visual analog scale Baseline: 3-7 out of 10 Goal status: On Going 04/28/2022   3.  Improve right knee AROM to 2-0-100 Baseline: 13-0-68 Goal status: On Going 04/28/2022   4.  Improve right quadriceps strength to at least 60% of the uninvolved left Baseline: 22% Goal status: INITIAL   5.  Ricky Barton will be able to walk 500 feet without a cane and manage 5 steps with subjective reports of no problem Baseline: Using a cane and has to use the handrail with 2 steps at home Goal status: INITIAL   6.  Ricky Barton will be independent with his long-term home exercise program at discharge Baseline: Started 04/17/2022 Goal status: On Going 04/28/2022     PLAN:   PT FREQUENCY:  3 times a week for 2 weeks then switch to twice a week   PT DURATION: 8 weeks   PLANNED INTERVENTIONS: Therapeutic exercises, Therapeutic activity, Neuromuscular re-education, Balance training, Gait training, Patient/Family education, Self  Care, Joint mobilization, Stair training, Cryotherapy, Vasopneumatic device, and  Manual therapy   PLAN FOR NEXT SESSION: Continue emphasis on AROM and quadriceps strengthening,.  Balance, gait and functional activities as appropriate.  Vaso to end.  Farley Ly, PT, MPT 04/28/2022, 4:05 PM

## 2022-05-01 ENCOUNTER — Ambulatory Visit: Payer: PPO | Admitting: Rehabilitative and Restorative Service Providers"

## 2022-05-01 ENCOUNTER — Encounter: Payer: Self-pay | Admitting: Rehabilitative and Restorative Service Providers"

## 2022-05-01 DIAGNOSIS — M6281 Muscle weakness (generalized): Secondary | ICD-10-CM

## 2022-05-01 DIAGNOSIS — R600 Localized edema: Secondary | ICD-10-CM

## 2022-05-01 DIAGNOSIS — M25561 Pain in right knee: Secondary | ICD-10-CM | POA: Diagnosis not present

## 2022-05-01 DIAGNOSIS — M25661 Stiffness of right knee, not elsewhere classified: Secondary | ICD-10-CM | POA: Diagnosis not present

## 2022-05-01 DIAGNOSIS — R262 Difficulty in walking, not elsewhere classified: Secondary | ICD-10-CM

## 2022-05-01 NOTE — Therapy (Signed)
OUTPATIENT PHYSICAL THERAPY TREATMENT NOTE   Patient Name: Ricky Barton MRN: [id number] DOB:1955/05/21, 67 y.o., male Today's Date: 05/01/2022  PCP: Ria Bush, MD  REFERRING PROVIDER: Leandrew Koyanagi, MD  END OF SESSION:   PT End of Session - 05/01/22 0948     Visit Number 6    Number of Visits 16    Date for PT Re-Evaluation 06/12/22    Authorization Type Ortho bundle    Authorization - Number of Visits 16    Progress Note Due on Visit 10    PT Start Time 0845    PT Stop Time 0945    PT Time Calculation (min) 60 min    Activity Tolerance Patient tolerated treatment well;No increased pain;Patient limited by pain    Behavior During Therapy [facility] for tasks assessed/performed             Past Medical History:  Diagnosis Date   Arthritis    bilateral knees   Hypoglycemic syndrome    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   COLONOSCOPY  07/2016   WNL, rpt 5 yrs Fuller Plan)   COLONOSCOPY  11/2021   2 HPs, rpt 7 yrs Fuller Plan)   Head MRI  08/2000   POLYPECTOMY  2013   TA"s x 3   TOTAL KNEE ARTHROPLASTY Left 05/12/2021   Procedure: LEFT TOTAL KNEE ARTHROPLASTY;  Surgeon: Leandrew Koyanagi, MD;  Location: [place];  Service: Orthopedics;  Laterality: Left;   TOTAL KNEE ARTHROPLASTY Right 04/03/2022   Procedure: RIGHT TOTAL KNEE ARTHROPLASTY;  Surgeon: Leandrew Koyanagi, MD;  Location: [place];  Service: Orthopedics;  Laterality: Right;   Patient Active Problem List   Diagnosis Date Noted   Status post total right knee replacement 04/03/2022   Primary osteoarthritis of right knee 02/04/2022   Primary osteoarthritis of left knee 05/12/2021    Status post total left knee replacement 05/12/2021   Allergic rhinitis 12/09/2020   Low serum vitamin B12 12/07/2020   Welcome to Medicare preventive visit 12/06/2020   Advanced directives, counseling/discussion 12/06/2020   Angular cheilitis 12/06/2020   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   Elevated blood pressure reading without diagnosis of hypertension 11/10/2018   Renal lesion 03/28/2018   Arthralgia of hands, bilateral 09/12/2015   OSA on CPAP 09/12/2015   Obesity, Class I, BMI 30-34.9 04/05/2014   Hyperglycemia 03/30/2013   Healthcare maintenance 03/04/2012   HLD (hyperlipidemia) 01/18/2009   ERECTILE DYSFUNCTION, 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  He is sleeping better with his muscle relaxant.  He is using his cane less often and has been reminded to not overdo weight-bearing and avoid increasing edema.  PAIN:  Are you having pain? Yes: NPRS scale: 3-8/10 this week Pain location: Right knee Pain description: Ache, tight and can be sharp Aggravating factors: Prolonged postures and too much weightbearing Relieving factors: Ice, pain medication and exercise   OBJECTIVE: (objective measures completed at initial  evaluation unless otherwise dated)  OBJECTIVE:    DIAGNOSTIC FINDINGS: IMPRESSION: 1. Right total knee arthroplasty without evidence of acute postoperative complication.   PATIENT SURVEYS:  EVAL: FOTO 47 (Goal 62 in 12 visits)   EDEMA:  Noted and not objectively assessed   LOWER EXTREMITY ROM:   ROM P:passive  A:active Right 04/17/22 Left 04/17/22 Right 04/20/22 Right 04/23/22 Right 04/28/2022 Right 05/01/2022  Knee flexion A: 68 A; 106 Seated P: 85* A: 80* Supine AA: 93 Active 95 Active 94  Knee extension A: -13 A: -2 Seated P: -5* A: quad set -7*  Active -5 Active -3   (Blank rows = not tested)   LOWER EXTREMITY STRENGTH:   MMT Right 04/17/2022 Left 04/17/2022 Right 05/01/2022  Knee flexion       Knee extension 19.3 pounds 87.2 pounds    (Blank rows = not tested)   GAIT: Distance walked: Within the clinic Assistive device utilized: Single point cane Level of assistance: Complete Independence Comments: Kiet would like to return to walking without the cane     TODAY'S TREATMENT:                                                                        DATE:  05/01/2022 SciFit bike seat 12 level 4.5 x 3 min Knee flexion with strap supine 10X 10 seconds and PT overpressure Seated knee flexion (left pushes right into flexion) 10X 10 seconds, PT overpressure on the last 5 Quadriceps sets with heel prop 2 sets of 10 for 5 seconds Prone knee extension stretch 3 minutes with 1.5#  Functional Activities: Step-up and over 4 and 6 inch step 10X each slow eccentrics Step-up and stretch into flexion with slow step-back 10X  Vaso right knee Medium 34* 10 minutes   04/28/2022 SciFit bike seat 12 level 4.5 x 3 min Knee flexion with strap supine 10X 10 seconds Seated knee flexion (left pushes right into flexion) 10X 10 seconds Quadriceps sets with heel prop 2 sets of 10 for 5 seconds Seated straight leg raises 2 sets of 5 for 3 seconds (avoid quad lag)  Functional  Activities: Double Leg Press 81# 15X slow eccentrics and full extension  Single Leg Press 37# 10X slow eccentrics and full extension  Vaso right knee Medium 34* 10 minutes   04/23/22 Therapeutic Exercise: SciFit bike seat 12 level 1 x 8 min AA knee flexion seated on Rt 10 x 10 sec; LLE providing overpressure Supine AA heel slide x 15 reps Long sitting quad sets x 15 reps bil Seated Rt LAQ 4# 2x10; 3 sec hold Sit to/from stand 2x10; without UE support Leg Press 81# 2x10  Manual Seated knee flexion PROM to tolerance   PATIENT EDUCATION:  Education  details: Reviewed exam findings and home exercises Person educated: Patient and Spouse Education method: Explanation, Demonstration, Tactile cues, Verbal cues, and Handouts Education comprehension: verbalized understanding, returned demonstration, verbal cues required, tactile cues required, and needs further education   HOME EXERCISE PROGRAM: Access Code: [email or web address] URL: [email or web address] Date: 04/17/2022 Prepared by: Vista Mink   Exercises - Supine Quadricep Sets  - 5 x daily - 7 x weekly - 2 sets - 10 reps - 5 second hold - Seated Knee Flexion AAROM  - 3-5 x daily - 7 x weekly - 1 sets - 1 reps - 3 minutes hold - Prone Knee Extension with Ankle Weight  - 2 x daily - 7 x weekly - 3-5 minutes hold   ASSESSMENT:  CLINICAL IMPRESSION: AROM was 3-0-94 today (13-0-68 at evaluation).  Active range of motion and quadriceps strength remain the early emphasis.  We transitioned into more functional step activities, although active range of motion, quadriceps strength and edema control remain the early emphasis.   OBJECTIVE IMPAIRMENTS: Abnormal gait, decreased activity tolerance, decreased balance, decreased endurance, and decreased knowledge of condition.    ACTIVITY LIMITATIONS: carrying, bending, sitting, standing, squatting, sleeping, stairs, and locomotion level   PARTICIPATION LIMITATIONS: driving, community  activity, and yard work   PERSONAL FACTORS: Bil knee OA, Lt ear hearing loss, h/o seizures, Lt TKA 05/12/2021, HLD are also affecting patient's functional outcome.    REHAB POTENTIAL: Good   CLINICAL DECISION MAKING: Stable/uncomplicated   EVALUATION COMPLEXITY: Low     GOALS: Goals reviewed with patient? Yes   SHORT TERM GOALS: Target date: 05/15/2022 Improve right knee AROM for extension to -5 and flexion to 90 degrees Baseline: 13-0-68 degrees Goal status: Met 05/01/2022   2.  Diomedes will be independent with his day 1 home exercise program Baseline: Started 04/17/2022 Goal status: Met 04/28/2022     LONG TERM GOALS: Target date: 06/12/2022   Improve FOTO to 62 Baseline: 47 Goal status: INITIAL   2.  Cloud will report right knee pain consistently 0-3 out of 10 on the visual analog scale Baseline: 3-7 out of 10 Goal status: On Going 05/01/2022   3.  Improve right knee AROM to 2-0-100 Baseline: 13-0-68 Goal status: On Going 05/01/2022   4.  Improve right quadriceps strength to at least 60% of the uninvolved left Baseline: 22% Goal status: INITIAL   5.  Obdulio will be able to walk 500 feet without a cane and manage 5 steps with subjective reports of no problem Baseline: Using a cane and has to use the handrail with 2 steps at home Goal status: INITIAL   6.  Aloysius will be independent with his long-term home exercise program at discharge Baseline: Started 04/17/2022 Goal status: On Going 05/01/2022     PLAN:   PT FREQUENCY:  3 times a week for 2 weeks then switch to twice a week   PT DURATION: 8 weeks   PLANNED INTERVENTIONS: Therapeutic exercises, Therapeutic activity, Neuromuscular re-education, Balance training, Gait training, Patient/Family education, Self Care, Joint mobilization, Stair training, Cryotherapy, Vasopneumatic device, and Manual therapy   PLAN FOR NEXT SESSION: Continue emphasis on AROM, edema control and quadriceps strengthening.  Balance, gait and  functional activities as appropriate.  Vaso to end.  Farley Ly, PT, MPT 05/01/2022, 9:52 AM

## 2022-05-04 ENCOUNTER — Ambulatory Visit: Payer: PPO | Admitting: Physical Therapy

## 2022-05-04 ENCOUNTER — Encounter: Payer: Self-pay | Admitting: Physical Therapy

## 2022-05-04 DIAGNOSIS — M25561 Pain in right knee: Secondary | ICD-10-CM

## 2022-05-04 DIAGNOSIS — R262 Difficulty in walking, not elsewhere classified: Secondary | ICD-10-CM | POA: Diagnosis not present

## 2022-05-04 DIAGNOSIS — M6281 Muscle weakness (generalized): Secondary | ICD-10-CM

## 2022-05-04 DIAGNOSIS — R600 Localized edema: Secondary | ICD-10-CM

## 2022-05-04 DIAGNOSIS — M25661 Stiffness of right knee, not elsewhere classified: Secondary | ICD-10-CM

## 2022-05-04 NOTE — Therapy (Signed)
OUTPATIENT PHYSICAL THERAPY TREATMENT NOTE   Patient Name: Ricky Barton MRN: [id number] DOB:[birth year], 67 y.o., male Today's Date: 05/04/2022  PCP: Ria Bush, MD  REFERRING PROVIDER: Leandrew Koyanagi, MD  END OF SESSION:   PT End of Session - 05/04/22 1016     Visit Number 7    Number of Visits 16    Date for PT Re-Evaluation 06/12/22    Authorization Type Ortho bundle    Authorization - Number of Visits 16    Progress Note Due on Visit 10    PT Start Time 1016    PT Stop Time 1115    PT Time Calculation (min) 59 min    Activity Tolerance Patient tolerated treatment well    Behavior During Therapy [facility] for tasks assessed/performed              Past Medical History:  Diagnosis Date   Arthritis    bilateral knees   Hypoglycemic syndrome    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   COLONOSCOPY  07/2016   WNL, rpt 5 yrs Fuller Plan)   COLONOSCOPY  11/2021   2 HPs, rpt 7 yrs Fuller Plan)   Head MRI  08/2000   POLYPECTOMY  2013   TA"s x 3   TOTAL KNEE ARTHROPLASTY Left 05/12/2021   Procedure: LEFT TOTAL KNEE ARTHROPLASTY;  Surgeon: Leandrew Koyanagi, MD;  Location: [place];  Service: Orthopedics;  Laterality: Left;   TOTAL KNEE ARTHROPLASTY Right 04/03/2022   Procedure: RIGHT TOTAL KNEE ARTHROPLASTY;  Surgeon: Leandrew Koyanagi, MD;  Location: [place];  Service: Orthopedics;  Laterality: Right;   Patient Active Problem List   Diagnosis Date Noted   Status post total right knee replacement 04/03/2022   Primary osteoarthritis of right knee 02/04/2022   Primary osteoarthritis of left knee 05/12/2021   Status post total left knee  replacement 05/12/2021   Allergic rhinitis 12/09/2020   Low serum vitamin B12 12/07/2020   Welcome to Medicare preventive visit 12/06/2020   Advanced directives, counseling/discussion 12/06/2020   Angular cheilitis 12/06/2020   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   Elevated blood pressure reading without diagnosis of hypertension 11/10/2018   Renal lesion 03/28/2018   Arthralgia of hands, bilateral 09/12/2015   OSA on CPAP 09/12/2015   Obesity, Class I, BMI 30-34.9 04/05/2014   Hyperglycemia 03/30/2013   Healthcare maintenance 03/04/2012   HLD (hyperlipidemia) 01/18/2009   ERECTILE DYSFUNCTION, 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   History of seizure 08/15/1991    REFERRING DIAG: M17.11 (ICD-10-CM) - Primary osteoarthritis of right knee   ONSET DATE: R knee TKA 04/03/2022   THERAPY DIAG:  Muscle weakness (generalized)  Localized edema  Difficulty in walking, not elsewhere classified  Stiffness of right knee, not elsewhere classified  Right knee pain, unspecified chronicity  Rationale for Evaluation and Treatment Rehabilitation  PERTINENT HISTORY: Bil knee OA, Lt ear hearing loss, h/o seizures, Lt TKA 05/12/2021, HLD   PRECAUTIONS: None  SUBJECTIVE:  SUBJECTIVE STATEMENT:   His knee was bothering him over weekend after last PT session.   PAIN:  Are you having pain? Yes: NPRS scale: today 5/10 and over last week 2-9/10 (he is weaning Rx meds) Pain location: Right knee Pain description: Ache, tight and can be sharp Aggravating factors: Prolonged postures and too much weightbearing Relieving factors: Ice, pain medication and exercise   OBJECTIVE: (objective measures completed at initial evaluation unless otherwise dated)  OBJECTIVE:    DIAGNOSTIC FINDINGS: IMPRESSION: 1. Right total knee arthroplasty without  evidence of acute postoperative complication.   PATIENT SURVEYS:  EVAL: FOTO 47 (Goal 62 in 12 visits)   EDEMA:  Noted and not objectively assessed   LOWER EXTREMITY ROM:   ROM P:passive  A:active Right 04/17/22 Left 04/17/22 Right 04/20/22 Right 04/23/22 Right 04/28/22 Right 05/01/22 Right 05/04/22  Knee flexion A: 68 A; 106 Seated P: 85* A: 80* Supine AA: 93 Active 95 Active 94 Seated A: 94* P: 97*  Knee extension A: -13 A: -2 Seated P: -5* A: quad set -7*  Active -5 Active -3 Seated A: LAQ -12* Standing A: -3*   (Blank rows = not tested)   LOWER EXTREMITY STRENGTH:   MMT Right 04/17/2022 Left 04/17/2022 Right 05/01/2022  Knee flexion       Knee extension 19.3 pounds 87.2 pounds    (Blank rows = not tested)   GAIT: Distance walked: Within the clinic Assistive device utilized: Single point cane Level of assistance: Complete Independence Comments: Dexten would like to return to walking without the cane     TODAY'S TREATMENT:                                                                        DATE:  05/04/2022: SciFit bike seat 12 level 4.5 with BLEs & BUEs 4 min, BLEs only 4 min Gastroc stretch step heel depression 30 sec 2 reps ea LE Quad stretch standing knee flex w/ towel 30 sec hold 2 reps Hamstring stretch single leg long sit using cane for DF 30 sec hold 2 reps  Functional Activities: Double Leg Press 100# 15X slow eccentrics and full extension  Single Leg Press 43# 10X slow eccentrics and full extension Balance tandem stance on foam 30 sec ea LE in front Step-up, over & step-down 6 inch step 10X leading with ea LE   Self-Care: PT removed 1 suture protruding thru skin. Slight bleeding but no signs of issues. PT demo & verbal cues on scar mobs. Pt verbalized understanding.  Manual therapy: PROM with overpressure for flexion seated and extension supine / leg press Contract-relax for flexion seated   Vaso right knee Medium 34* 10  minutes   05/01/2022 SciFit bike seat 12 level 4.5 x 3 min Knee flexion with strap supine 10X 10 seconds and PT overpressure Seated knee flexion (left pushes right into flexion) 10X 10 seconds, PT overpressure on the last 5 Quadriceps sets with heel prop 2 sets of 10 for 5 seconds Prone knee extension stretch 3 minutes with 1.5#  Functional Activities: Step-up and over 4 and 6 inch step 10X each slow eccentrics Step-up and stretch into flexion with slow step-back 10X  Vaso right knee Medium 34* 10 minutes  04/28/2022 SciFit bike seat 12 level 4.5 x 3 min Knee flexion with strap supine 10X 10 seconds Seated knee flexion (left pushes right into flexion) 10X 10 seconds Quadriceps sets with heel prop 2 sets of 10 for 5 seconds Seated straight leg raises 2 sets of 5 for 3 seconds (avoid quad lag)  Functional Activities: Double Leg Press 81# 15X slow eccentrics and full extension  Single Leg Press 37# 10X slow eccentrics and full extension  Vaso right knee Medium 34* 10 minutes    PATIENT EDUCATION:  Education details: Reviewed exam findings and home exercises Person educated: Patient and Spouse Education method: Explanation, Demonstration, Tactile cues, Verbal cues, and Handouts Education comprehension: verbalized understanding, returned demonstration, verbal cues required, tactile cues required, and needs further education   HOME EXERCISE PROGRAM: Access Code: [email or web address] URL: [email or web address] Date: 05/04/2022 Prepared by: Jamey Reas  Exercises - Supine Quadricep Sets  - 5 x daily - 7 x weekly - 2 sets - 10 reps - 5 second hold - Seated Knee Flexion AAROM  - 3-5 x daily - 7 x weekly - 1 sets - 1 reps - 3 minutes hold - Prone Knee Extension with Ankle Weight  - 2 x daily - 7 x weekly - 3-5 minutes hold - Gastroc Stretch on Step  - 2 x daily - 7 x weekly - 1 sets - 2-3 reps - 30 seconds hold - Standing Quad Stretch with Strap  - 2 x daily - 7 x weekly - 1  sets - 2-3 reps - 30 seconds hold - Seated Table Hamstring Stretch  - 2 x daily - 7 x weekly - 1 sets - 2-3 reps - 30 seconds hold   ASSESSMENT:  CLINICAL IMPRESSION: Patient is improving functional range and strength.  He appears to understand stretches for 3 major muscles that cross knee.  He continues to benefit from skilled PT.    OBJECTIVE IMPAIRMENTS: Abnormal gait, decreased activity tolerance, decreased balance, decreased endurance, and decreased knowledge of condition.    ACTIVITY LIMITATIONS: carrying, bending, sitting, standing, squatting, sleeping, stairs, and locomotion level   PARTICIPATION LIMITATIONS: driving, community activity, and yard work   PERSONAL FACTORS: Bil knee OA, Lt ear hearing loss, h/o seizures, Lt TKA 05/12/2021, HLD are also affecting patient's functional outcome.    REHAB POTENTIAL: Good   CLINICAL DECISION MAKING: Stable/uncomplicated   EVALUATION COMPLEXITY: Low     GOALS: Goals reviewed with patient? Yes   SHORT TERM GOALS: Target date: 05/15/2022 Improve right knee AROM for extension to -5 and flexion to 90 degrees Baseline: 13-0-68 degrees Goal status: Met 05/01/2022   2.  Dalen will be independent with his day 1 home exercise program Baseline: Started 04/17/2022 Goal status: Met 04/28/2022     LONG TERM GOALS: Target date: 06/12/2022   Improve FOTO to 62 Baseline: 47 Goal status: INITIAL   2.  Zayed will report right knee pain consistently 0-3 out of 10 on the visual analog scale Baseline: 3-7 out of 10 Goal status: On Going 05/01/2022   3.  Improve right knee AROM to 2-0-100 Baseline: 13-0-68 Goal status: On Going 05/01/2022   4.  Improve right quadriceps strength to at least 60% of the uninvolved left Baseline: 22% Goal status: INITIAL   5.  Avron will be able to walk 500 feet without a cane and manage 5 steps with subjective reports of no problem Baseline: Using a cane and has to use the handrail with 2 steps at home Goal  status: INITIAL   6.  Joziah will be independent with his long-term home exercise program at discharge Baseline: Started 04/17/2022 Goal status: On Going 05/01/2022     PLAN:   PT FREQUENCY:  3 times a week for 2 weeks then switch to twice a week   PT DURATION: 8 weeks   PLANNED INTERVENTIONS: Therapeutic exercises, Therapeutic activity, Neuromuscular re-education, Balance training, Gait training, Patient/Family education, Self Care, Joint mobilization, Stair training, Cryotherapy, Vasopneumatic device, and Manual therapy   PLAN FOR NEXT SESSION: continue manual therapy & functional exercises to progress range & strength.  Vaso to end   Jamey Reas, PT, DPT 05/04/2022, 11:22 AM

## 2022-05-06 ENCOUNTER — Encounter: Payer: Self-pay | Admitting: Physical Therapy

## 2022-05-06 ENCOUNTER — Ambulatory Visit: Payer: PPO | Admitting: Physical Therapy

## 2022-05-06 DIAGNOSIS — M6281 Muscle weakness (generalized): Secondary | ICD-10-CM | POA: Diagnosis not present

## 2022-05-06 DIAGNOSIS — M25661 Stiffness of right knee, not elsewhere classified: Secondary | ICD-10-CM

## 2022-05-06 DIAGNOSIS — R600 Localized edema: Secondary | ICD-10-CM

## 2022-05-06 DIAGNOSIS — R262 Difficulty in walking, not elsewhere classified: Secondary | ICD-10-CM | POA: Diagnosis not present

## 2022-05-06 DIAGNOSIS — M25561 Pain in right knee: Secondary | ICD-10-CM

## 2022-05-06 NOTE — Therapy (Signed)
OUTPATIENT PHYSICAL THERAPY TREATMENT NOTE   Patient Name: Ricky Barton MRN: [id number] DOB:[birth year], 67 y.o., male Today's Date: 05/06/2022  PCP: Ria Bush, MD  REFERRING PROVIDER: Leandrew Koyanagi, MD  END OF SESSION:   PT End of Session - 05/06/22 1348     Visit Number 8    Number of Visits 16    Date for PT Re-Evaluation 06/12/22    Authorization Type Ortho bundle    Authorization - Number of Visits 16    Progress Note Due on Visit 10    PT Start Time [id number]    PT Stop Time 1440    PT Time Calculation (min) 55 min    Activity Tolerance Patient tolerated treatment well    Behavior During Therapy [facility] for tasks assessed/performed               Past Medical History:  Diagnosis Date   Arthritis    bilateral knees   Hypoglycemic syndrome    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   COLONOSCOPY  07/2016   WNL, rpt 5 yrs Fuller Plan)   COLONOSCOPY  11/2021   2 HPs, rpt 7 yrs Fuller Plan)   Head MRI  08/2000   POLYPECTOMY  2013   TA"s x 3   TOTAL KNEE ARTHROPLASTY Left 05/12/2021   Procedure: LEFT TOTAL KNEE ARTHROPLASTY;  Surgeon: Leandrew Koyanagi, MD;  Location: [place];  Service: Orthopedics;  Laterality: Left;   TOTAL KNEE ARTHROPLASTY Right 04/03/2022   Procedure: RIGHT TOTAL KNEE ARTHROPLASTY;  Surgeon: Leandrew Koyanagi, MD;  Location: [place];  Service: Orthopedics;  Laterality: Right;   Patient Active Problem List   Diagnosis Date Noted   Status post total right knee replacement 04/03/2022   Primary osteoarthritis of right knee 02/04/2022   Primary osteoarthritis of left knee 05/12/2021   Status post total left knee  replacement 05/12/2021   Allergic rhinitis 12/09/2020   Low serum vitamin B12 12/07/2020   Welcome to Medicare preventive visit 12/06/2020   Advanced directives, counseling/discussion 12/06/2020   Angular cheilitis 12/06/2020   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   Elevated blood pressure reading without diagnosis of hypertension 11/10/2018   Renal lesion 03/28/2018   Arthralgia of hands, bilateral 09/12/2015   OSA on CPAP 09/12/2015   Obesity, Class I, BMI 30-34.9 04/05/2014   Hyperglycemia 03/30/2013   Healthcare maintenance 03/04/2012   HLD (hyperlipidemia) 01/18/2009   ERECTILE DYSFUNCTION, 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   History of seizure 08/15/1991    REFERRING DIAG: M17.11 (ICD-10-CM) - Primary osteoarthritis of right knee   ONSET DATE: R knee TKA 04/03/2022   THERAPY DIAG:  Muscle weakness (generalized)  Localized edema  Difficulty in walking, not elsewhere classified  Stiffness of right knee, not elsewhere classified  Right knee pain, unspecified chronicity  Rationale for Evaluation and Treatment Rehabilitation  PERTINENT HISTORY: Bil knee OA, Lt ear hearing loss, h/o seizures, Lt TKA 05/12/2021, HLD   PRECAUTIONS: None  SUBJECTIVE:  SUBJECTIVE STATEMENT:  His knee stiffens up if he sits more than 30 min like riding in car.     PAIN:  Are you having pain? Yes: NPRS scale: today 5/10 and over last week 2-9/10 (he is weaning Rx meds) Pain location: Right knee Pain description: Ache, tight and can be sharp Aggravating factors: Prolonged postures and too much weightbearing Relieving factors: Ice, pain medication and exercise   OBJECTIVE: (objective measures completed at initial evaluation unless otherwise dated)  OBJECTIVE:    DIAGNOSTIC FINDINGS: IMPRESSION: 1. Right total knee arthroplasty  without evidence of acute postoperative complication.   PATIENT SURVEYS:  EVAL: FOTO 47 (Goal 62 in 12 visits)   EDEMA:  Noted and not objectively assessed   LOWER EXTREMITY ROM:   ROM P:passive  A:active Right 04/17/22 Left 04/17/22 Right 04/20/22 Right 04/23/22 Right 04/28/22 Right 05/01/22 Right 05/04/22  Knee flexion A: 68 A; 106 Seated P: 85* A: 80* Supine AA: 93 Active 95 Active 94 Seated A: 94* P: 97*  Knee extension A: -13 A: -2 Seated P: -5* A: quad set -7*  Active -5 Active -3 Seated A: LAQ -12* Standing A: -3*   (Blank rows = not tested)   LOWER EXTREMITY STRENGTH:   MMT Right 04/17/2022 Left 04/17/2022 Right 05/01/2022  Knee flexion       Knee extension 19.3 pounds 87.2 pounds    (Blank rows = not tested)   GAIT: Distance walked: Within the clinic Assistive device utilized: Single point cane Level of assistance: Complete Independence Comments: Ricky Barton would like to return to walking without the cane     TODAY'S TREATMENT:                                                                        DATE:  05/06/2022: Therapeutic Exercise: SciFit bike seat 12 level 4.5 with BLEs & BUEs 4 min, BLEs only 4 min Gastroc stretch step heel depression 30 sec 2 reps ea LE Quad stretch standing knee flex w/ towel 30 sec hold 2 reps Hamstring stretch LLE strap SLR with PT manual assist knee ext 30 sec hold 2 reps Heel raises without UE support 15 reps Seated LAQ & active knee flexion with contralateral LE opposing motion 5 sec hold for 2 min Bridge moving feet closer on rep 3 & 5,  6 reps 3 sets 5 sec hold including flexion stretch  Functional Activities: Double Leg Press 100# 15X slow eccentrics and full extension  Single Leg Press  50# 10X slow eccentrics and full extension Step-up, over & step-down 6 inch step 10X leading with ea LE.  Ascend contralateral single UE, descend eccentric RLE BUE support & LLE no support.  Standing on foam RLE SLS reaching LLE towards cones  with knee flexion outward & ext inward motion 5 cones (anterior across midline, ant-lat, lat, post-lat, posterior across midline)    Manual therapy: PROM with overpressure for flexion seated and extension supine / leg press Contract-relax for flexion seated PT performed scar mobs   Vaso right knee Medium 34* 10 minutes with elevation  05/04/2022: SciFit bike seat 12 level 4.5 with BLEs & BUEs 4 min, BLEs only 4 min Gastroc stretch step heel depression 30 sec 2 reps ea LE  Quad stretch standing knee flex w/ towel 30 sec hold 2 reps Hamstring stretch single leg long sit using cane for DF 30 sec hold 2 reps  Functional Activities: Double Leg Press 100# 15X slow eccentrics and full extension  Single Leg Press 43# 10X slow eccentrics and full extension Balance tandem stance on foam 30 sec ea LE in front Step-up, over & step-down 6 inch step 10X leading with ea LE   Self-Care: PT removed 1 suture protruding thru skin. Slight bleeding but no signs of issues. PT demo & verbal cues on scar mobs. Pt verbalized understanding.  Manual therapy: PROM with overpressure for flexion seated and extension supine / leg press Contract-relax for flexion seated   Vaso right knee Medium 34* 10 minutes   05/01/2022 SciFit bike seat 12 level 4.5 x 3 min Knee flexion with strap supine 10X 10 seconds and PT overpressure Seated knee flexion (left pushes right into flexion) 10X 10 seconds, PT overpressure on the last 5 Quadriceps sets with heel prop 2 sets of 10 for 5 seconds Prone knee extension stretch 3 minutes with 1.5#  Functional Activities: Step-up and over 4 and 6 inch step 10X each slow eccentrics Step-up and stretch into flexion with slow step-back 10X  Vaso right knee Medium 34* 10 minutes   PATIENT EDUCATION:  Education details: Reviewed exam findings and home exercises Person educated: Patient and Spouse Education method: Explanation, Demonstration, Tactile cues, Verbal cues, and  Handouts Education comprehension: verbalized understanding, returned demonstration, verbal cues required, tactile cues required, and needs further education   HOME EXERCISE PROGRAM: Access Code: [email or web address] URL: [email or web address] Date: 05/04/2022 Prepared by: Jamey Reas  Exercises - Supine Quadricep Sets  - 5 x daily - 7 x weekly - 2 sets - 10 reps - 5 second hold - Seated Knee Flexion AAROM  - 3-5 x daily - 7 x weekly - 1 sets - 1 reps - 3 minutes hold - Prone Knee Extension with Ankle Weight  - 2 x daily - 7 x weekly - 3-5 minutes hold - Gastroc Stretch on Step  - 2 x daily - 7 x weekly - 1 sets - 2-3 reps - 30 seconds hold - Standing Quad Stretch with Strap  - 2 x daily - 7 x weekly - 1 sets - 2-3 reps - 30 seconds hold - Seated Table Hamstring Stretch  - 2 x daily - 7 x weekly - 1 sets - 2-3 reps - 30 seconds hold   ASSESSMENT:  CLINICAL IMPRESSION: Patient continues to improve knee range & function with progressive activities. Edema is a big factor in his stiffness when he sits too long.   He continues to benefit from skilled PT.    OBJECTIVE IMPAIRMENTS: Abnormal gait, decreased activity tolerance, decreased balance, decreased endurance, and decreased knowledge of condition.    ACTIVITY LIMITATIONS: carrying, bending, sitting, standing, squatting, sleeping, stairs, and locomotion level   PARTICIPATION LIMITATIONS: driving, community activity, and yard work   PERSONAL FACTORS: Bil knee OA, Lt ear hearing loss, h/o seizures, Lt TKA 05/12/2021, HLD are also affecting patient's functional outcome.    REHAB POTENTIAL: Good   CLINICAL DECISION MAKING: Stable/uncomplicated   EVALUATION COMPLEXITY: Low     GOALS: Goals reviewed with patient? Yes   SHORT TERM GOALS: Target date: 05/15/2022 Improve right knee AROM for extension to -5 and flexion to 90 degrees Baseline: 13-0-68 degrees Goal status: Met 05/01/2022   2.  Ricky Barton will be independent with his day 1  home  exercise program Baseline: Started 04/17/2022 Goal status: Met 04/28/2022     LONG TERM GOALS: Target date: 06/12/2022   Improve FOTO to 62 Baseline: 47 Goal status: INITIAL   2.  Ricky Barton will report right knee pain consistently 0-3 out of 10 on the visual analog scale Baseline: 3-7 out of 10 Goal status: On Going 05/01/2022   3.  Improve right knee AROM to 2-0-100 Baseline: 13-0-68 Goal status: On Going 05/01/2022   4.  Improve right quadriceps strength to at least 60% of the uninvolved left Baseline: 22% Goal status: INITIAL   5.  Ricky Barton will be able to walk 500 feet without a cane and manage 5 steps with subjective reports of no problem Baseline: Using a cane and has to use the handrail with 2 steps at home Goal status: INITIAL   6.  Ricky Barton will be independent with his long-term home exercise program at discharge Baseline: Started 04/17/2022 Goal status: On Going 05/01/2022     PLAN:   PT FREQUENCY:  3 times a week for 2 weeks then switch to twice a week   PT DURATION: 8 weeks   PLANNED INTERVENTIONS: Therapeutic exercises, Therapeutic activity, Neuromuscular re-education, Balance training, Gait training, Patient/Family education, Self Care, Joint mobilization, Stair training, Cryotherapy, Vasopneumatic device, and Manual therapy   PLAN FOR NEXT SESSION:   continue manual therapy & functional exercises to progress range & strength.  Vaso to end   Jamey Reas, PT, DPT 05/06/2022, 4:17 PM

## 2022-05-08 ENCOUNTER — Telehealth: Payer: Self-pay | Admitting: *Deleted

## 2022-05-08 ENCOUNTER — Encounter: Payer: Self-pay | Admitting: Rehabilitative and Restorative Service Providers"

## 2022-05-08 ENCOUNTER — Ambulatory Visit: Payer: PPO | Admitting: Rehabilitative and Restorative Service Providers"

## 2022-05-08 DIAGNOSIS — R262 Difficulty in walking, not elsewhere classified: Secondary | ICD-10-CM | POA: Diagnosis not present

## 2022-05-08 DIAGNOSIS — M25661 Stiffness of right knee, not elsewhere classified: Secondary | ICD-10-CM | POA: Diagnosis not present

## 2022-05-08 DIAGNOSIS — R600 Localized edema: Secondary | ICD-10-CM | POA: Diagnosis not present

## 2022-05-08 DIAGNOSIS — M6281 Muscle weakness (generalized): Secondary | ICD-10-CM

## 2022-05-08 DIAGNOSIS — M25561 Pain in right knee: Secondary | ICD-10-CM | POA: Diagnosis not present

## 2022-05-08 MED ORDER — METHOCARBAMOL 750 MG PO TABS: 750.0000 mg | ORAL_TABLET | Freq: Two times a day (BID) | ORAL | 2 refills | Status: DC | PRN

## 2022-05-08 NOTE — Therapy (Signed)
OUTPATIENT PHYSICAL THERAPY TREATMENT NOTE   Patient Name: Ricky Barton MRN: [id number] DOB:08-09-1955, 67 y.o., male Today's Date: 05/08/2022  PCP: Ria Bush, MD  REFERRING PROVIDER: Leandrew Koyanagi, MD  END OF SESSION:   PT End of Session - 05/08/22 0846     Visit Number 9    Number of Visits 16    Date for PT Re-Evaluation 06/12/22    Authorization Type Ortho bundle    Authorization - Number of Visits 16    Progress Note Due on Visit 10    PT Start Time 0845    PT Stop Time 0940    PT Time Calculation (min) 55 min    Activity Tolerance Patient tolerated treatment well;Patient limited by pain;No increased pain    Behavior During Therapy [facility] for tasks assessed/performed             Past Medical History:  Diagnosis Date   Arthritis    bilateral knees   Hypoglycemic syndrome    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   COLONOSCOPY  07/2016   WNL, rpt 5 yrs Fuller Plan)   COLONOSCOPY  11/2021   2 HPs, rpt 7 yrs Fuller Plan)   Head MRI  08/2000   POLYPECTOMY  2013   TA"s x 3   TOTAL KNEE ARTHROPLASTY Left 05/12/2021   Procedure: LEFT TOTAL KNEE ARTHROPLASTY;  Surgeon: Leandrew Koyanagi, MD;  Location: [place];  Service: Orthopedics;  Laterality: Left;   TOTAL KNEE ARTHROPLASTY Right 04/03/2022   Procedure: RIGHT TOTAL KNEE ARTHROPLASTY;  Surgeon: Leandrew Koyanagi, MD;  Location: [place];  Service: Orthopedics;  Laterality: Right;   Patient Active Problem List   Diagnosis Date Noted   Status post total right knee replacement 04/03/2022   Primary osteoarthritis of right knee 02/04/2022   Primary osteoarthritis of left knee 05/12/2021    Status post total left knee replacement 05/12/2021   Allergic rhinitis 12/09/2020   Low serum vitamin B12 12/07/2020   Welcome to Medicare preventive visit 12/06/2020   Advanced directives, counseling/discussion 12/06/2020   Angular cheilitis 12/06/2020   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   Elevated blood pressure reading without diagnosis of hypertension 11/10/2018   Renal lesion 03/28/2018   Arthralgia of hands, bilateral 09/12/2015   OSA on CPAP 09/12/2015   Obesity, Class I, BMI 30-34.9 04/05/2014   Hyperglycemia 03/30/2013   Healthcare maintenance 03/04/2012   HLD (hyperlipidemia) 01/18/2009   ERECTILE DYSFUNCTION, 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   History of seizure 08/15/1991    REFERRING DIAG: M17.11 (ICD-10-CM) - Primary osteoarthritis of right knee   ONSET DATE: R knee TKA 04/03/2022   THERAPY DIAG:  Muscle weakness (generalized)  Localized edema  Difficulty in walking, not elsewhere classified  Stiffness of right knee, not elsewhere classified  Right knee pain, unspecified chronicity  Rationale for Evaluation and Treatment Rehabilitation  PERTINENT HISTORY: Bil knee OA, Lt ear hearing loss, h/o seizures, Lt TKA 05/12/2021, HLD   PRECAUTIONS: None  SUBJECTIVE:  SUBJECTIVE STATEMENT:  Jodan reports more difficulty sleeping last night.  He believes it is related to the rain today.  PAIN:  Are you having pain? Yes: NPRS scale: 2-6/10 this week (he is weaning Rx meds) Pain location: Right knee Pain description: Ache, tight and can be sharp Aggravating factors: Prolonged postures and too much weightbearing Relieving factors: Ice, pain medication and exercise   OBJECTIVE: (objective measures completed at initial evaluation unless otherwise dated)  OBJECTIVE:    DIAGNOSTIC FINDINGS:  IMPRESSION: 1. Right total knee arthroplasty without evidence of acute postoperative complication.   PATIENT SURVEYS:  05/08/2021: FOTO 52 (Goal 62)  EVAL: FOTO 47 (Goal 62 in 12 visits)   EDEMA:  Noted and not objectively assessed   LOWER EXTREMITY ROM:   ROM P:passive  A:active Right 04/17/22 Left 04/17/22 Right 04/20/22 Right 04/23/22 Right 04/28/22 Right 05/01/22 Right 05/04/22 Right 05/08/22 AROM  Knee flexion A: 68 A; 106 Seated P: 85* A: 80* Supine AA: 93 Active 95 Active 94 Seated A: 94* P: 97* Supine 98  Knee extension A: -13 A: -2 Seated P: -5* A: quad set -7*  Active -5 Active -3 Seated A: LAQ -12* Standing A: -3* Supine -3     (Blank rows = not tested)   LOWER EXTREMITY STRENGTH:   MMT Right 04/17/2022 Left 04/17/2022 Right 05/08/2022  Knee flexion       Knee extension 19.3 pounds 87.2 pounds 37.2 pounds   (Blank rows = not tested)   GAIT: Distance walked: Within the clinic Assistive device utilized: Single point cane Level of assistance: Complete Independence Comments: Ardarius would like to return to walking without the cane     TODAY'S TREATMENT:                                                                        DATE:  05/08/2022 SciFit bike seat 12 level 4.5 x 8 min Knee flexion with strap supine 10X 10 seconds and PT overpressure Seated knee flexion (left pushes right into flexion) 10X 10 seconds, PT overpressure on all 10 Quadriceps sets with heel prop 2 sets of 10 for 5 seconds Prone knee extension stretch 3 minutes with 1.5#  Reassessment & Progress note  Vaso right knee Medium 34* 10 minutes   05/06/2022: Therapeutic Exercise: SciFit bike seat 12 level 4.5 with BLEs & BUEs 4 min, BLEs only 4 min Gastroc stretch step heel depression 30 sec 2 reps ea LE Quad stretch standing knee flex w/ towel 30 sec hold 2 reps Hamstring stretch LLE strap SLR with PT manual assist knee ext 30 sec hold 2 reps Heel raises without UE support 15 reps Seated  LAQ & active knee flexion with contralateral LE opposing motion 5 sec hold for 2 min Bridge moving feet closer on rep 3 & 5,  6 reps 3 sets 5 sec hold including flexion stretch  Functional Activities: Double Leg Press 100# 15X slow eccentrics and full extension  Single Leg Press  50# 10X slow eccentrics and full extension Step-up, over & step-down 6 inch step 10X leading with ea LE.  Ascend contralateral single UE, descend eccentric RLE BUE support & LLE no support.  Standing on foam RLE SLS reaching LLE towards cones with  knee flexion outward & ext inward motion 5 cones (anterior across midline, ant-lat, lat, post-lat, posterior across midline)   Manual therapy: PROM with overpressure for flexion seated and extension supine / leg press Contract-relax for flexion seated PT performed scar mobs   Vaso right knee Medium 34* 10 minutes with elevation   05/04/2022: SciFit bike seat 12 level 4.5 with BLEs & BUEs 4 min, BLEs only 4 min Gastroc stretch step heel depression 30 sec 2 reps ea LE Quad stretch standing knee flex w/ towel 30 sec hold 2 reps Hamstring stretch single leg long sit using cane for DF 30 sec hold 2 reps  Functional Activities: Double Leg Press 100# 15X slow eccentrics and full extension  Single Leg Press 43# 10X slow eccentrics and full extension Balance tandem stance on foam 30 sec ea LE in front Step-up, over & step-down 6 inch step 10X leading with ea LE   Self-Care: PT removed 1 suture protruding thru skin. Slight bleeding but no signs of issues. PT demo & verbal cues on scar mobs. Pt verbalized understanding.  Manual therapy: PROM with overpressure for flexion seated and extension supine / leg press Contract-relax for flexion seated   Vaso right knee Medium 34* 10 minutes   05/01/2022 SciFit bike seat 12 level 4.5 x 3 min Knee flexion with strap supine 10X 10 seconds and PT overpressure Seated knee flexion (left pushes right into flexion) 10X 10 seconds,  PT overpressure on the last 5 Quadriceps sets with heel prop 2 sets of 10 for 5 seconds Prone knee extension stretch 3 minutes with 1.5#  Functional Activities: Step-up and over 4 and 6 inch step 10X each slow eccentrics Step-up and stretch into flexion with slow step-back 10X  Vaso right knee Medium 34* 10 minutes   PATIENT EDUCATION:  Education details: Reviewed exam findings and home exercises Person educated: Patient and Spouse Education method: Explanation, Demonstration, Tactile cues, Verbal cues, and Handouts Education comprehension: verbalized understanding, returned demonstration, verbal cues required, tactile cues required, and needs further education   HOME EXERCISE PROGRAM: Access Code: [email or web address] URL: [email or web address] Date: 05/04/2022 Prepared by: Jamey Reas  Exercises - Supine Quadricep Sets  - 5 x daily - 7 x weekly - 2 sets - 10 reps - 5 second hold - Seated Knee Flexion AAROM  - 3-5 x daily - 7 x weekly - 1 sets - 1 reps - 3 minutes hold - Prone Knee Extension with Ankle Weight  - 2 x daily - 7 x weekly - 3-5 minutes hold - Gastroc Stretch on Step  - 2 x daily - 7 x weekly - 1 sets - 2-3 reps - 30 seconds hold - Standing Quad Stretch with Strap  - 2 x daily - 7 x weekly - 1 sets - 2-3 reps - 30 seconds hold - Seated Table Hamstring Stretch  - 2 x daily - 7 x weekly - 1 sets - 2-3 reps - 30 seconds hold   ASSESSMENT:  CLINICAL IMPRESSION: Atley is making good progress towards long-term goals.  AROM is 3-0-98 degrees.  We discussed his emphasis on returning full AROM with his HEP and will focus more on balance, function and gait with his remaining supervised PT.  I expect Refujio will be ready for independent PT in 3-4 weeks.   OBJECTIVE IMPAIRMENTS: Abnormal gait, decreased activity tolerance, decreased balance, decreased endurance, and decreased knowledge of condition.    ACTIVITY LIMITATIONS: carrying, bending, sitting, standing, squatting,  sleeping, stairs, and locomotion level  PARTICIPATION LIMITATIONS: driving, community activity, and yard work   PERSONAL FACTORS: Bil knee OA, Lt ear hearing loss, h/o seizures, Lt TKA 05/12/2021, HLD are also affecting patient's functional outcome.    REHAB POTENTIAL: Good   CLINICAL DECISION MAKING: Stable/uncomplicated   EVALUATION COMPLEXITY: Low     GOALS: Goals reviewed with patient? Yes   SHORT TERM GOALS: Target date: 05/15/2022 Improve right knee AROM for extension to -5 and flexion to 90 degrees Baseline: 13-0-68 degrees Goal status: Met 05/01/2022   2.  Hiroyuki will be independent with his day 1 home exercise program Baseline: Started 04/17/2022 Goal status: Met 04/28/2022     LONG TERM GOALS: Target date: 06/12/2022   Improve FOTO to 62 Baseline: 47 Goal status: On Going 05/08/2022   2.  Paolo will report right knee pain consistently 0-3 out of 10 on the visual analog scale Baseline: 3-7 out of 10 Goal status: On Going 05/08/2022   3.  Improve right knee AROM to 2-0-100 Baseline: 13-0-68 Goal status: On Going (close) 05/08/2022   4.  Improve right quadriceps strength to at least 60% of the uninvolved left Baseline: 22% Goal status: On Going 05/08/2022   5.  Zachery will be able to walk 500 feet without a cane and manage 5 steps with subjective reports of no problem Baseline: Using a cane and has to use the handrail with 2 steps at home Goal status: Partially Met 05/08/2022   6.  Narvel will be independent with his long-term home exercise program at discharge Baseline: Started 04/17/2022 Goal status: On Going 05/08/2022     PLAN:   PT FREQUENCY:  3 times a week for 2 weeks then switch to twice a week   PT DURATION: 8 weeks   PLANNED INTERVENTIONS: Therapeutic exercises, Therapeutic activity, Neuromuscular re-education, Balance training, Gait training, Patient/Family education, Self Care, Joint mobilization, Stair training, Cryotherapy, Vasopneumatic device,  and Manual therapy   PLAN FOR NEXT SESSION:  Emphasis on balance, gait, steps and function with his clinic program.  AROM and quadriceps strength with HEP.   Farley Ly, PT, MPT 05/08/2022, 10:37 AM

## 2022-05-08 NOTE — Telephone Encounter (Signed)
done 

## 2022-05-08 NOTE — Telephone Encounter (Signed)
Patient called requesting refill of muscle relaxer. Pharmacy on chart. Thanks.

## 2022-05-11 ENCOUNTER — Encounter: Payer: Self-pay | Admitting: Physical Therapy

## 2022-05-11 ENCOUNTER — Ambulatory Visit: Payer: PPO | Admitting: Physical Therapy

## 2022-05-11 DIAGNOSIS — M25561 Pain in right knee: Secondary | ICD-10-CM | POA: Diagnosis not present

## 2022-05-11 DIAGNOSIS — R600 Localized edema: Secondary | ICD-10-CM

## 2022-05-11 DIAGNOSIS — M25661 Stiffness of right knee, not elsewhere classified: Secondary | ICD-10-CM | POA: Diagnosis not present

## 2022-05-11 DIAGNOSIS — R262 Difficulty in walking, not elsewhere classified: Secondary | ICD-10-CM | POA: Diagnosis not present

## 2022-05-11 DIAGNOSIS — M6281 Muscle weakness (generalized): Secondary | ICD-10-CM | POA: Diagnosis not present

## 2022-05-11 NOTE — Therapy (Signed)
OUTPATIENT PHYSICAL THERAPY TREATMENT NOTE & PROGRESS NOTE   Patient Name: Ricky Barton MRN: [id number] DOB:1956-01-23, 67 y.o., male Today's Date: 05/11/2022  PCP: Ria Bush, MD  REFERRING PROVIDER: Leandrew Koyanagi, MD  Progress Note Reporting Period 04/17/2022 to 05/11/2022  See note below for Objective Data and Assessment of Progress/Goals.      END OF SESSION:   PT End of Session - 05/11/22 0850     Visit Number 10    Number of Visits 16    Date for PT Re-Evaluation 06/12/22    Authorization Type Ortho bundle    Authorization - Number of Visits 16    Progress Note Due on Visit 10    PT Start Time 0849    PT Stop Time 0940    PT Time Calculation (min) 51 min    Activity Tolerance Patient tolerated treatment well;Patient limited by pain;No increased pain    Behavior During Therapy [facility] for tasks assessed/performed              Past Medical History:  Diagnosis Date   Arthritis    bilateral knees   Hypoglycemic syndrome    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   COLONOSCOPY  07/2016   WNL, rpt 5 yrs Fuller Plan)   COLONOSCOPY  11/2021   2 HPs, rpt 7 yrs Fuller Plan)   Head MRI  08/2000   POLYPECTOMY  2013   TA"s x 3   TOTAL KNEE ARTHROPLASTY Left 05/12/2021   Procedure: LEFT TOTAL KNEE ARTHROPLASTY;  Surgeon: Leandrew Koyanagi, MD;  Location: [place];  Service: Orthopedics;  Laterality: Left;   TOTAL KNEE ARTHROPLASTY Right 04/03/2022   Procedure: RIGHT TOTAL KNEE ARTHROPLASTY;  Surgeon: Leandrew Koyanagi, MD;  Location: [place];  Service: Orthopedics;  Laterality: Right;   Patient Active Problem List   Diagnosis Date Noted    Status post total right knee replacement 04/03/2022   Primary osteoarthritis of right knee 02/04/2022   Primary osteoarthritis of left knee 05/12/2021   Status post total left knee replacement 05/12/2021   Allergic rhinitis 12/09/2020   Low serum vitamin B12 12/07/2020   Welcome to Medicare preventive visit 12/06/2020   Advanced directives, counseling/discussion 12/06/2020   Angular cheilitis 12/06/2020   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   Elevated blood pressure reading without diagnosis of hypertension 11/10/2018   Renal lesion 03/28/2018   Arthralgia of hands, bilateral 09/12/2015   OSA on CPAP 09/12/2015   Obesity, Class I, BMI 30-34.9 04/05/2014   Hyperglycemia 03/30/2013   Healthcare maintenance 03/04/2012   HLD (hyperlipidemia) 01/18/2009   ERECTILE DYSFUNCTION, 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   History of seizure 08/15/1991    REFERRING DIAG: M17.11 (ICD-10-CM) - Primary osteoarthritis of right knee   ONSET DATE: R knee TKA 04/03/2022   THERAPY DIAG:  Muscle weakness (generalized)  Localized edema  Difficulty in walking, not elsewhere classified  Stiffness of right knee, not elsewhere classified  Right knee pain, unspecified chronicity  Rationale for Evaluation and Treatment Rehabilitation  PERTINENT HISTORY: Bil knee OA, Lt ear hearing loss, h/o  seizures, Lt TKA 05/12/2021, HLD   PRECAUTIONS: None  SUBJECTIVE:                                                                                                                                                                                      SUBJECTIVE STATEMENT:  He went to funeral out of town. Between long drive & increased standing his knee is stiff this morning.   PAIN:  Are you having pain? Yes: NPRS scale: today 5/10 &  2-8/10 over last week (he is weaning Rx meds) Pain location: Right knee Pain description: Ache, tight and can be sharp Aggravating factors: Prolonged postures and too much weightbearing Relieving  factors: Ice, pain medication and exercise   OBJECTIVE: (objective measures completed at initial evaluation unless otherwise dated)  OBJECTIVE:    DIAGNOSTIC FINDINGS: IMPRESSION: 1. Right total knee arthroplasty without evidence of acute postoperative complication.   PATIENT SURVEYS:  05/08/2021: FOTO 52 (Goal 62)  EVAL: FOTO 47 (Goal 62 in 12 visits)   EDEMA:  Noted and not objectively assessed   LOWER EXTREMITY ROM:   ROM P:passive  A:active Right 04/17/22 Left 04/17/22 Right 04/20/22 Right 04/23/22 Right 04/28/22 Right 05/01/22 Right 05/04/22 Right 05/08/22 AROM Right 05/11/22  Knee flexion A: 68 A; 106 Seated P: 85* A: 80* Supine AA: 93 Active 95 Active 94 Seated A: 94* P: 97* Supine 98 Supine A: 91* P: 97*  Knee extension A: -13 A: -2 Seated P: -5* A: quad set -7*  Active -5 Active -3 Seated A: LAQ -12* Standing A: -3* Supine -3   Supine Quad set A: -4* P: -2*   (Blank rows = not tested)   LOWER EXTREMITY STRENGTH:   MMT Right 04/17/2022 Left 04/17/2022 Right 05/08/2022  Knee flexion       Knee extension 19.3 pounds 87.2 pounds 37.2 pounds   (Blank rows = not tested)   GAIT: Distance walked: Within the clinic Assistive device utilized: Single point cane Level of assistance: Complete Independence Comments: Anders would like to return to walking without the cane     TODAY'S TREATMENT:                                                                        DATE:  05/11/2022: Therapeutic Exercise: SciFit bike seat 12 with BLEs & BUEs level 1 for 3 min & level 4.5 for 3 min; level  3 BLEs only 2 min;  8 min total Hamstring stretch LLE strap SLR with PT manual assist knee ext 30 sec hold 2 reps Gastroc stretch straddle step wt shift with 4 quad sets 5 sec hold stretch hold 30 sec 3 reps ea LE Squat at sink over chair 10 reps cues on technique Quad stretch standing knee flex w/ towel 30 sec hold 2 reps Heel raises without UE support 15 reps Bridge moving feet  closer on rep 3 & 5,  6 reps 3 sets 5 sec hold including flexion stretch  Functional Activities: Double Leg Press 112# 15X slow eccentrics and full extension with PT manual technique for ext.  Single Leg Press RLE 56# 15X slow eccentrics and full extension  Standing on foam RLE SLS reaching LLE towards cones with knee flexion outward & ext inward motion 5 cones (anterior across midline, ant-lat, lat, post-lat, posterior across midline)  BUE support (sink & cane)  Manual therapy: PROM with overpressure for flexion seated and extension supine / leg press  Vaso right knee Medium 34* 10 minutes with elevation  05/08/2022 SciFit bike seat 12 level 4.5 x 8 min Knee flexion with strap supine 10X 10 seconds and PT overpressure Seated knee flexion (left pushes right into flexion) 10X 10 seconds, PT overpressure on all 10 Quadriceps sets with heel prop 2 sets of 10 for 5 seconds Prone knee extension stretch 3 minutes with 1.5#  Reassessment & Progress note  Vaso right knee Medium 34* 10 minutes   05/06/2022: Therapeutic Exercise: SciFit bike seat 12 level 4.5 with BLEs & BUEs 4 min, BLEs only 4 min Gastroc stretch step heel depression 30 sec 2 reps ea LE Quad stretch standing knee flex w/ towel 30 sec hold 2 reps Hamstring stretch LLE strap SLR with PT manual assist knee ext 30 sec hold 2 reps Heel raises without UE support 15 reps Seated LAQ & active knee flexion with contralateral LE opposing motion 5 sec hold for 2 min Bridge moving feet closer on rep 3 & 5,  6 reps 3 sets 5 sec hold including flexion stretch  Functional Activities: Double Leg Press 100# 15X slow eccentrics and full extension  Single Leg Press  50# 10X slow eccentrics and full extension Step-up, over & step-down 6 inch step 10X leading with ea LE.  Ascend contralateral single UE, descend eccentric RLE BUE support & LLE no support.  Standing on foam RLE SLS reaching LLE towards cones with knee flexion outward & ext inward  motion 5 cones (anterior across midline, ant-lat, lat, post-lat, posterior across midline)   Manual therapy: PROM with overpressure for flexion seated and extension supine / leg press Contract-relax for flexion seated PT performed scar mobs   Vaso right knee Medium 34* 10 minutes with elevation    PATIENT EDUCATION:  Education details: Reviewed exam findings and home exercises Person educated: Patient and Spouse Education method: Explanation, Demonstration, Tactile cues, Verbal cues, and Handouts Education comprehension: verbalized understanding, returned demonstration, verbal cues required, tactile cues required, and needs further education   HOME EXERCISE PROGRAM: Access Code: [email or web address] URL: [email or web address] Date: 05/04/2022 Prepared by: Jamey Reas  Exercises - Supine Quadricep Sets  - 5 x daily - 7 x weekly - 2 sets - 10 reps - 5 second hold - Seated Knee Flexion AAROM  - 3-5 x daily - 7 x weekly - 1 sets - 1 reps - 3 minutes hold - Prone Knee Extension with Ankle Weight  - 2 x daily -  7 x weekly - 3-5 minutes hold - Gastroc Stretch on Step  - 2 x daily - 7 x weekly - 1 sets - 2-3 reps - 30 seconds hold - Standing Quad Stretch with Strap  - 2 x daily - 7 x weekly - 1 sets - 2-3 reps - 30 seconds hold - Seated Table Hamstring Stretch  - 2 x daily - 7 x weekly - 1 sets - 2-3 reps - 30 seconds hold   ASSESSMENT:  CLINICAL IMPRESSION: Pt. has attended 10 visits overall during course of treatment.  See objective data for updated information.  Pt. has made good gains to this point c mild defiicts noted in Rt knee AROM and movement coordination.  Pt. may continue to benefit from skilled PT services to continue progression towards reaching established goals and reduced difficulty due to presentation.   OBJECTIVE IMPAIRMENTS: Abnormal gait, decreased activity tolerance, decreased balance, decreased endurance, and decreased knowledge of condition.    ACTIVITY  LIMITATIONS: carrying, bending, sitting, standing, squatting, sleeping, stairs, and locomotion level   PARTICIPATION LIMITATIONS: driving, community activity, and yard work   PERSONAL FACTORS: Bil knee OA, Lt ear hearing loss, h/o seizures, Lt TKA 05/12/2021, HLD are also affecting patient's functional outcome.    REHAB POTENTIAL: Good   CLINICAL DECISION MAKING: Stable/uncomplicated   EVALUATION COMPLEXITY: Low     GOALS: Goals reviewed with patient? Yes   SHORT TERM GOALS: Target date: 05/15/2022 Improve right knee AROM for extension to -5 and flexion to 90 degrees Baseline: 13-0-68 degrees Goal status: Met 05/01/2022   2.  Miyon will be independent with his day 1 home exercise program Baseline: Started 04/17/2022 Goal status: Met 04/28/2022     LONG TERM GOALS: Target date: 06/12/2022   Improve FOTO to 62 Baseline: 47 Goal status: On Going 05/08/2022   2.  Aleksa will report right knee pain consistently 0-3 out of 10 on the visual analog scale Baseline: 3-7 out of 10 Goal status: On Going 05/08/2022   3.  Improve right knee AROM to 2-0-100 Baseline: 13-0-68 Goal status: On Going (close) 05/08/2022   4.  Improve right quadriceps strength to at least 60% of the uninvolved left Baseline: 22% Goal status: On Going 05/08/2022   5.  Treveyon will be able to walk 500 feet without a cane and manage 5 steps with subjective reports of no problem Baseline: Using a cane and has to use the handrail with 2 steps at home Goal status: Partially Met 05/08/2022   6.  Derien will be independent with his long-term home exercise program at discharge Baseline: Started 04/17/2022 Goal status: On Going 05/08/2022     PLAN:   PT FREQUENCY:  3 times a week for 2 weeks then switch to twice a week   PT DURATION: 8 weeks   PLANNED INTERVENTIONS: Therapeutic exercises, Therapeutic activity, Neuromuscular re-education, Balance training, Gait training, Patient/Family education, Self Care, Joint  mobilization, Stair training, Cryotherapy, Vasopneumatic device, and Manual therapy   PLAN FOR NEXT SESSION:  continue with manual therapy & exercises for range / functional strength.     Jamey Reas, PT, DPT 05/11/2022, 10:47 AM

## 2022-05-13 ENCOUNTER — Encounter: Payer: Self-pay | Admitting: Rehabilitative and Restorative Service Providers"

## 2022-05-13 ENCOUNTER — Ambulatory Visit: Payer: PPO | Admitting: Rehabilitative and Restorative Service Providers"

## 2022-05-13 DIAGNOSIS — M25661 Stiffness of right knee, not elsewhere classified: Secondary | ICD-10-CM | POA: Diagnosis not present

## 2022-05-13 DIAGNOSIS — R600 Localized edema: Secondary | ICD-10-CM | POA: Diagnosis not present

## 2022-05-13 DIAGNOSIS — R262 Difficulty in walking, not elsewhere classified: Secondary | ICD-10-CM | POA: Diagnosis not present

## 2022-05-13 DIAGNOSIS — M6281 Muscle weakness (generalized): Secondary | ICD-10-CM

## 2022-05-13 DIAGNOSIS — M25561 Pain in right knee: Secondary | ICD-10-CM | POA: Diagnosis not present

## 2022-05-13 NOTE — Therapy (Signed)
OUTPATIENT PHYSICAL THERAPY TREATMENT NOTE  Patient Name: Ricky Barton MRN: [id number] DOB:09-Feb-1956, 67 y.o., male Today's Date: 05/13/2022  PCP: Ria Bush, MD  REFERRING PROVIDER: Leandrew Koyanagi, MD  END OF SESSION:   PT End of Session - 05/13/22 1335     Visit Number 11    Number of Visits 16    Date for PT Re-Evaluation 06/12/22    Authorization Type Ortho bundle    Authorization - Visit Number 11    Authorization - Number of Visits 16    Progress Note Due on Visit 16    PT Start Time [id number]    PT Stop Time 1440    PT Time Calculation (min) 66 min    Activity Tolerance Patient tolerated treatment well;Patient limited by pain;No increased pain    Behavior During Therapy [facility] for tasks assessed/performed             Past Medical History:  Diagnosis Date   Arthritis    bilateral knees   Hypoglycemic syndrome    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   COLONOSCOPY  07/2016   WNL, rpt 5 yrs Fuller Plan)   COLONOSCOPY  11/2021   2 HPs, rpt 7 yrs Fuller Plan)   Head MRI  08/2000   POLYPECTOMY  2013   TA"s x 3   TOTAL KNEE ARTHROPLASTY Left 05/12/2021   Procedure: LEFT TOTAL KNEE ARTHROPLASTY;  Surgeon: Leandrew Koyanagi, MD;  Location: [facility];  Service: Orthopedics;  Laterality: Left;   TOTAL KNEE ARTHROPLASTY Right 04/03/2022   Procedure: RIGHT TOTAL KNEE ARTHROPLASTY;  Surgeon: Leandrew Koyanagi, MD;  Location: [facility];  Service: Orthopedics;  Laterality: Right;   Patient Active Problem List   Diagnosis Date Noted   Status post total right knee replacement 04/03/2022   Primary osteoarthritis of right knee 02/04/2022   Primary  osteoarthritis of left knee 05/12/2021   Status post total left knee replacement 05/12/2021   Allergic rhinitis 12/09/2020   Low serum vitamin B12 12/07/2020   Welcome to Medicare preventive visit 12/06/2020   Advanced directives, counseling/discussion 12/06/2020   Angular cheilitis 12/06/2020   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   Elevated blood pressure reading without diagnosis of hypertension 11/10/2018   Renal lesion 03/28/2018   Arthralgia of hands, bilateral 09/12/2015   OSA on CPAP 09/12/2015   Obesity, Class I, BMI 30-34.9 04/05/2014   Hyperglycemia 03/30/2013   Healthcare maintenance 03/04/2012   HLD (hyperlipidemia) 01/18/2009   ERECTILE DYSFUNCTION, 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   History of seizure 08/15/1991    REFERRING DIAG: M17.11 (ICD-10-CM) - Primary osteoarthritis of right knee   ONSET DATE: R knee TKA 04/03/2022   THERAPY DIAG:  Muscle weakness (generalized)  Localized edema  Difficulty in walking, not elsewhere classified  Stiffness of right knee, not elsewhere classified  Right knee pain, unspecified chronicity  Rationale for Evaluation and Treatment Rehabilitation  PERTINENT HISTORY: Bil knee OA, Lt ear hearing loss, h/o seizures, Lt TKA 05/12/2021, HLD   PRECAUTIONS: None  SUBJECTIVE:  SUBJECTIVE STATEMENT:  Ricky Barton reports sleep has not been good the past few days.  He continues his good HEP compliance.  PAIN:  Are you having pain? Yes: NPRS scale: 2-8/10 this week Pain location: Right knee Pain description: Ache, tight and can be sharp Aggravating factors: Prolonged postures and too much weightbearing Relieving factors: Ice, pain medication and exercise   OBJECTIVE: (objective measures completed at initial evaluation unless otherwise dated)  OBJECTIVE:    DIAGNOSTIC  FINDINGS: IMPRESSION: 1. Right total knee arthroplasty without evidence of acute postoperative complication.   PATIENT SURVEYS:  05/08/2021: FOTO 52 (Goal 62)  EVAL: FOTO 47 (Goal 62 in 12 visits)   EDEMA:  Noted and not objectively assessed   LOWER EXTREMITY ROM:   ROM P:passive  A:active Right 04/17/22 Left 04/17/22 Right 04/20/22 Right 04/23/22 Right 04/28/22 Right 05/01/22 Right 05/04/22 Right 05/08/22 AROM Right 05/11/22 Left/Right in degrees 05/13/2022  Knee flexion A: 68 A; 106 Seated P: 85* A: 80* Supine AA: 93 Active 95 Active 94 Seated A: 94* P: 97* Supine 98 Supine A: 91* P: 97* 109/100  Knee extension A: -13 A: -2 Seated P: -5* A: quad set -7*  Active -5 Active -3 Seated A: LAQ -12* Standing A: -3* Supine -3   Supine Quad set A: -4* P: -2* 0/-3   (Blank rows = not tested)   LOWER EXTREMITY STRENGTH:   MMT Right 04/17/2022 Left 04/17/2022 Right 05/08/2022  Knee flexion       Knee extension 19.3 pounds 87.2 pounds 37.2 pounds   (Blank rows = not tested)   GAIT: Distance walked: Within the clinic Assistive device utilized: Single point cane Level of assistance: Complete Independence Comments: Ricky Barton would like to return to walking without the cane     TODAY'S TREATMENT:                                                                        DATE:  05/13/2022: Sci[employer] bike Seat 12 Level 4.5 for 8 minutes Knee flexion with strap supine 10X 10 seconds and PT overpressure Seated knee flexion (left pushes right into flexion) 10X 10 seconds, PT overpressure on all 10 Quadriceps sets with heel prop 2 sets of 10 for 5 seconds Prone knee extension stretch 4 minutes with 1.5#  Functional Activities: Diagonal squat lift and golfer's lift- added because Ricky Barton noted he can squat, but he sometimes can't get back up without help  Vaso right knee Medium 34* 10 minutes   05/11/2022: Therapeutic Exercise: SciFit bike seat 12 with BLEs & BUEs level 1 for 3 min & level 4.5  for 3 min; level 3 BLEs only 2 min;  8 min total Hamstring stretch LLE strap SLR with PT manual assist knee ext 30 sec hold 2 reps Gastroc stretch straddle step wt shift with 4 quad sets 5 sec hold stretch hold 30 sec 3 reps ea LE Squat at sink over chair 10 reps cues on technique Quad stretch standing knee flex w/ towel 30 sec hold 2 reps Heel raises without UE support 15 reps Bridge moving feet closer on rep 3 & 5,  6 reps 3 sets 5 sec hold including flexion stretch  Functional Activities: Double Leg Press 112# 15X slow eccentrics  and full extension with PT manual technique for ext.  Single Leg Press RLE 56# 15X slow eccentrics and full extension  Standing on foam RLE SLS reaching LLE towards cones with knee flexion outward & ext inward motion 5 cones (anterior across midline, ant-lat, lat, post-lat, posterior across midline)  BUE support (sink & cane)  Manual therapy: PROM with overpressure for flexion seated and extension supine / leg press  Vaso right knee Medium 34* 10 minutes with elevation   05/08/2022 SciFit bike seat 12 level 4.5 x 8 min Knee flexion with strap supine 10X 10 seconds and PT overpressure Seated knee flexion (left pushes right into flexion) 10X 10 seconds, PT overpressure on all 10 Quadriceps sets with heel prop 2 sets of 10 for 5 seconds Prone knee extension stretch 3 minutes with 1.5#  Reassessment & Progress note  Vaso right knee Medium 34* 10 minutes   PATIENT EDUCATION:  Education details: Reviewed exam findings and home exercises Person educated: Patient and Spouse Education method: Explanation, Demonstration, Tactile cues, Verbal cues, and Handouts Education comprehension: verbalized understanding, returned demonstration, verbal cues required, tactile cues required, and needs further education   HOME EXERCISE PROGRAM: Access Code: [email or web address] URL: [email or web address] Date: 05/04/2022 Prepared by: Jamey Reas  Exercises - Supine  Quadricep Sets  - 5 x daily - 7 x weekly - 2 sets - 10 reps - 5 second hold - Seated Knee Flexion AAROM  - 3-5 x daily - 7 x weekly - 1 sets - 1 reps - 3 minutes hold - Prone Knee Extension with Ankle Weight  - 2 x daily - 7 x weekly - 3-5 minutes hold - Gastroc Stretch on Step  - 2 x daily - 7 x weekly - 1 sets - 2-3 reps - 30 seconds hold - Standing Quad Stretch with Strap  - 2 x daily - 7 x weekly - 1 sets - 2-3 reps - 30 seconds hold - Seated Table Hamstring Stretch  - 2 x daily - 7 x weekly - 1 sets - 2-3 reps - 30 seconds hold   ASSESSMENT:  CLINICAL IMPRESSION: AROM (flexion emphasis), quadriceps strength and edema control remain the emphasis with Jancarlo's supervised PT.  We will transition into more balance and functional activities as time allows as long as flexion AROM continues to progress.   OBJECTIVE IMPAIRMENTS: Abnormal gait, decreased activity tolerance, decreased balance, decreased endurance, and decreased knowledge of condition.    ACTIVITY LIMITATIONS: carrying, bending, sitting, standing, squatting, sleeping, stairs, and locomotion level   PARTICIPATION LIMITATIONS: driving, community activity, and yard work   PERSONAL FACTORS: Bil knee OA, Lt ear hearing loss, h/o seizures, Lt TKA 05/12/2021, HLD are also affecting patient's functional outcome.    REHAB POTENTIAL: Good   CLINICAL DECISION MAKING: Stable/uncomplicated   EVALUATION COMPLEXITY: Low     GOALS: Goals reviewed with patient? Yes   SHORT TERM GOALS: Target date: 05/15/2022 Improve right knee AROM for extension to -5 and flexion to 90 degrees Baseline: 13-0-68 degrees Goal status: Met 05/01/2022   2.  Myking will be independent with his day 1 home exercise program Baseline: Started 04/17/2022 Goal status: Met 04/28/2022     LONG TERM GOALS: Target date: 06/12/2022   Improve FOTO to 62 Baseline: 47 Goal status: On Going 05/08/2022   2.  Srithik will report right knee pain consistently 0-3 out of 10 on  the visual analog scale Baseline: 3-7 out of 10 Goal status: On Going 05/13/2022   3.  Improve  right knee AROM to 2-0-100 Baseline: 13-0-68 Goal status: Partially Met (close) 05/13/2022   4.  Improve right quadriceps strength to at least 60% of the uninvolved left Baseline: 22% Goal status: On Going 05/08/2022   5.  Jazmine will be able to walk 500 feet without a cane and manage 5 steps with subjective reports of no problem Baseline: Using a cane and has to use the handrail with 2 steps at home Goal status: Partially Met 05/13/2022   6.  Waylin will be independent with his long-term home exercise program at discharge Baseline: Started 04/17/2022 Goal status: On Going 05/13/2022     PLAN:   PT FREQUENCY:  3 times a week for 2 weeks then switch to twice a week   PT DURATION: 8 weeks   PLANNED INTERVENTIONS: Therapeutic exercises, Therapeutic activity, Neuromuscular re-education, Balance training, Gait training, Patient/Family education, Self Care, Joint mobilization, Stair training, Cryotherapy, Vasopneumatic device, and Manual therapy   PLAN FOR NEXT SESSION: Continue with manual therapy & exercises for range / functional strength.    Farley Ly, PT, MPT 05/13/2022, 2:32 PM

## 2022-05-15 ENCOUNTER — Encounter: Payer: Self-pay | Admitting: Orthopaedic Surgery

## 2022-05-15 ENCOUNTER — Ambulatory Visit (INDEPENDENT_AMBULATORY_CARE_PROVIDER_SITE_OTHER): Payer: PPO

## 2022-05-15 ENCOUNTER — Ambulatory Visit (INDEPENDENT_AMBULATORY_CARE_PROVIDER_SITE_OTHER): Payer: PPO | Admitting: Physician Assistant

## 2022-05-15 DIAGNOSIS — Z96651 Presence of right artificial knee joint: Secondary | ICD-10-CM

## 2022-05-15 MED ORDER — TRAMADOL HCL 50 MG PO TABS: 50.0000 mg | ORAL_TABLET | Freq: Three times a day (TID) | ORAL | 2 refills | Status: DC | PRN

## 2022-05-15 NOTE — Progress Notes (Signed)
Post-Op Visit Note   Patient: Ricky Barton           Date of Birth: 15-Mar-1956           MRN: [id number] Visit Date: 05/15/2022 PCP: Ria Bush, MD   Assessment & Plan:  Chief Complaint:  Chief Complaint  Patient presents with   Right Knee - Follow-up    Right total knee arthroplasty 04/03/2022   Visit Diagnoses:  1. Status post total right knee replacement     Plan: Patient is a pleasant 67 year old gentleman who comes in today 6 weeks status post right total knee replacement 04/03/2022.  He has been doing well.  He has been taking tramadol and muscle relaxers as needed.  He is in physical therapy making good progress.  He has been compliant taking baby aspirin twice daily for DVT prophylaxis.  Examination of his right knee reveals a fully healed surgical scar without complication.  Range of motion 5 to 95 degrees.  Stable valgus varus stress.  He is neurovascular intact distally.  At this point he will continue working in outpatient physical therapy.  He may discontinue his aspirin for DVT prophylaxis from our standpoint.  I have refilled his tramadol.  He will follow-up in 6 weeks for recheck.  Call with concerns or questions.  Follow-Up Instructions: Return in about 6 weeks (around 06/26/2022).   Orders:  Orders Placed This Encounter  Procedures   XR Knee 1-2 Views Right   No orders of the defined types were placed in this encounter.   Imaging: XR Knee 1-2 Views Right  Result Date: 05/15/2022 Well-seated prosthesis with complication   PMFS History: Patient Active Problem List   Diagnosis Date Noted   Status post total right knee replacement 04/03/2022   Primary osteoarthritis of right knee 02/04/2022   Primary osteoarthritis of left knee 05/12/2021   Status post total left knee replacement 05/12/2021   Allergic rhinitis 12/09/2020   Low serum vitamin B12 12/07/2020   Welcome to Medicare preventive visit 12/06/2020   Advanced directives,  counseling/discussion 12/06/2020   Angular cheilitis 12/06/2020   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   Elevated blood pressure reading without diagnosis of hypertension 11/10/2018   Renal lesion 03/28/2018   Arthralgia of hands, bilateral 09/12/2015   OSA on CPAP 09/12/2015   Obesity, Class I, BMI 30-34.9 04/05/2014   Hyperglycemia 03/30/2013   Healthcare maintenance 03/04/2012   HLD (hyperlipidemia) 01/18/2009   ERECTILE DYSFUNCTION, 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   History of seizure 08/15/1991   Past Medical History:  Diagnosis Date   Arthritis    bilateral knees   Hypoglycemic syndrome    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    OSA on CPAP 09/12/2015   Severe by HST   Seizures ([place]) 1990s   isolated, none since, was on dilantin for 3 yrs, then stopped   Sleep apnea    uses CPAP nightly    Family History  Problem Relation Age of Onset   Stomach cancer Mother    Hypertension Mother    Colon polyps Mother    Cancer Father 63       stomach cancer   Hyperlipidemia Brother    CAD Brother 73       MI (smoker)   Hyperlipidemia Brother    Sleep apnea Brother    Pancreatic cancer Brother 72   Hyperlipidemia Brother    CAD Brother 38       MI (smoker)  Hyperlipidemia Brother    Multiple sclerosis Brother    Colon cancer Neg Hx    Esophageal cancer Neg Hx    Rectal cancer Neg Hx     Past Surgical History:  Procedure Laterality Date   CHOLECYSTECTOMY  1989   COLONOSCOPY  03/2011   3 adenomatous polyps, rec rpt 5 yrs (Dr Fuller Plan).   COLONOSCOPY  07/2016   WNL, rpt 5 yrs Fuller Plan)   COLONOSCOPY  11/2021   2 HPs, rpt 7 yrs Fuller Plan)   Head MRI  08/2000   POLYPECTOMY  2013   TA"s x 3   TOTAL KNEE ARTHROPLASTY Left 05/12/2021   Procedure: LEFT TOTAL KNEE ARTHROPLASTY;  Surgeon: Leandrew Koyanagi, MD;  Location: [facility];  Service: Orthopedics;  Laterality: Left;   TOTAL KNEE ARTHROPLASTY Right 04/03/2022   Procedure: RIGHT TOTAL KNEE ARTHROPLASTY;  Surgeon: Leandrew Koyanagi, MD;  Location: [facility];  Service: Orthopedics;  Laterality: Right;   Social History   Occupational History   Occupation: Radiographer, therapeutic: [employer]    Comment: [employer]  Tobacco Use   Smoking status: Never   Smokeless tobacco: Never  Vaping Use   Vaping Use: Never used  Substance and Sexual Activity   Alcohol use: No   Drug use: No   Sexual activity: Yes

## 2022-05-20 ENCOUNTER — Ambulatory Visit: Payer: PPO | Admitting: Rehabilitative and Restorative Service Providers"

## 2022-05-20 ENCOUNTER — Encounter: Payer: Self-pay | Admitting: Rehabilitative and Restorative Service Providers"

## 2022-05-20 DIAGNOSIS — M25561 Pain in right knee: Secondary | ICD-10-CM | POA: Diagnosis not present

## 2022-05-20 DIAGNOSIS — M6281 Muscle weakness (generalized): Secondary | ICD-10-CM | POA: Diagnosis not present

## 2022-05-20 DIAGNOSIS — M25661 Stiffness of right knee, not elsewhere classified: Secondary | ICD-10-CM | POA: Diagnosis not present

## 2022-05-20 DIAGNOSIS — R600 Localized edema: Secondary | ICD-10-CM | POA: Diagnosis not present

## 2022-05-20 DIAGNOSIS — R262 Difficulty in walking, not elsewhere classified: Secondary | ICD-10-CM | POA: Diagnosis not present

## 2022-05-20 NOTE — Therapy (Signed)
OUTPATIENT PHYSICAL THERAPY TREATMENT NOTE  Patient Name: Ricky Barton MRN: [id number] DOB:Sep 20, 1955, 67 y.o., male Today's Date: 05/20/2022  PCP: Ria Bush, MD  REFERRING PROVIDER: Leandrew Koyanagi, MD  END OF SESSION:   PT End of Session - 05/20/22 1428     Visit Number 12    Number of Visits 16    Date for PT Re-Evaluation 06/12/22    Authorization Type Ortho bundle    Authorization - Visit Number 12    Authorization - Number of Visits 16    Progress Note Due on Visit 16    PT Start Time 1427    PT Stop Time 1520    PT Time Calculation (min) 53 min    Activity Tolerance Patient tolerated treatment well;Patient limited by pain;No increased pain    Behavior During Therapy [facility] for tasks assessed/performed              Past Medical History:  Diagnosis Date   Arthritis    bilateral knees   Hypoglycemic syndrome    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   COLONOSCOPY  07/2016   WNL, rpt 5 yrs Fuller Plan)   COLONOSCOPY  11/2021   2 HPs, rpt 7 yrs Fuller Plan)   Head MRI  08/2000   POLYPECTOMY  2013   TA"s x 3   TOTAL KNEE ARTHROPLASTY Left 05/12/2021   Procedure: LEFT TOTAL KNEE ARTHROPLASTY;  Surgeon: Leandrew Koyanagi, MD;  Location: [place];  Service: Orthopedics;  Laterality: Left;   TOTAL KNEE ARTHROPLASTY Right 04/03/2022   Procedure: RIGHT TOTAL KNEE ARTHROPLASTY;  Surgeon: Leandrew Koyanagi, MD;  Location: [place];  Service: Orthopedics;  Laterality: Right;   Patient Active Problem List   Diagnosis Date Noted   Status post total right knee replacement 04/03/2022   Primary osteoarthritis of right knee 02/04/2022   Primary  osteoarthritis of left knee 05/12/2021   Status post total left knee replacement 05/12/2021   Allergic rhinitis 12/09/2020   Low serum vitamin B12 12/07/2020   Welcome to Medicare preventive visit 12/06/2020   Advanced directives, counseling/discussion 12/06/2020   Angular cheilitis 12/06/2020   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   Elevated blood pressure reading without diagnosis of hypertension 11/10/2018   Renal lesion 03/28/2018   Arthralgia of hands, bilateral 09/12/2015   OSA on CPAP 09/12/2015   Obesity, Class I, BMI 30-34.9 04/05/2014   Hyperglycemia 03/30/2013   Healthcare maintenance 03/04/2012   HLD (hyperlipidemia) 01/18/2009   ERECTILE DYSFUNCTION, 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   History of seizure 08/15/1991    REFERRING DIAG: M17.11 (ICD-10-CM) - Primary osteoarthritis of right knee   ONSET DATE: R knee TKA 04/03/2022   THERAPY DIAG:  Muscle weakness (generalized)  Localized edema  Difficulty in walking, not elsewhere classified  Stiffness of right knee, not elsewhere classified  Right knee pain, unspecified chronicity  Rationale for Evaluation and Treatment Rehabilitation  PERTINENT HISTORY: Bil knee OA, Lt ear hearing loss, h/o seizures, Lt TKA 05/12/2021, HLD   PRECAUTIONS: None  SUBJECTIVE:  SUBJECTIVE STATEMENT:  Ricky Barton reports stiffness with 2 trips to [place] this weekend.  HEP compliance has been good, with a few days off due to 2.5 hours of mowing.  PAIN:  Are you having pain? Yes: NPRS scale: 2-8/10 this week Pain location: Right knee Pain description: Ache, tight and can be sharp Aggravating factors: Prolonged postures and too much weightbearing Relieving factors: Ice, pain medication and exercise   OBJECTIVE: (objective measures completed at initial evaluation unless  otherwise dated)  OBJECTIVE:    DIAGNOSTIC FINDINGS: IMPRESSION: 1. Right total knee arthroplasty without evidence of acute postoperative complication.   PATIENT SURVEYS:  05/08/2021: FOTO 52 (Goal 62)  EVAL: FOTO 47 (Goal 62 in 12 visits)   EDEMA:  Noted and not objectively assessed   LOWER EXTREMITY ROM:   ROM P:passive  A:active Right 04/17/22 Left 04/17/22 Right 04/20/22 Right 04/23/22 Right 04/28/22 Right 05/01/22 Right 05/04/22 Right 05/08/22 AROM Right 05/11/22 Left/Right in degrees 05/13/2022  Knee flexion A: 68 A; 106 Seated P: 85* A: 80* Supine AA: 93 Active 95 Active 94 Seated A: 94* P: 97* Supine 98 Supine A: 91* P: 97* 109/100  Knee extension A: -13 A: -2 Seated P: -5* A: quad set -7*  Active -5 Active -3 Seated A: LAQ -12* Standing A: -3* Supine -3   Supine Quad set A: -4* P: -2* 0/-3   (Blank rows = not tested)   LOWER EXTREMITY STRENGTH:   MMT Right 04/17/2022 Left 04/17/2022 Right 05/08/2022  Knee flexion       Knee extension 19.3 pounds 87.2 pounds 37.2 pounds   (Blank rows = not tested)   GAIT: Distance walked: Within the clinic Assistive device utilized: Single point cane Level of assistance: Complete Independence Comments: Ricky Barton would like to return to walking without the cane     TODAY'S TREATMENT:                                                                        DATE:  05/20/2022: SciFit bike Seat 12 Level 4.5 for 8 minutes Knee flexion with strap supine 10X 10 seconds and PT overpressure Seated knee flexion (left pushes right into flexion) 10X 10 seconds, PT overpressure on all 10 Quadriceps sets with heel prop 2 sets of 10 for 5 seconds Prone knee extension stretch 4 minutes with 1.5#  Functional Activities:  Step-down off 4 inch step 10X slow eccentrics Step-up and over 6 inch step 10X slow eccentrics  Vaso right knee Medium 34* 10 minutes   05/13/2022: SciFit bike Seat 12 Level 4.5 for 8 minutes Knee flexion with strap  supine 10X 10 seconds and PT overpressure Seated knee flexion (left pushes right into flexion) 10X 10 seconds, PT overpressure on all 10 Quadriceps sets with heel prop 2 sets of 10 for 5 seconds Prone knee extension stretch 4 minutes with 1.5#  Functional Activities: Diagonal squat lift and golfer's lift- added because Ricky Barton noted he can squat, but he sometimes can't get back up without help  Vaso right knee Medium 34* 10 minutes   05/11/2022: Therapeutic Exercise: SciFit bike seat 12 with BLEs & BUEs level 1 for 3 min & level 4.5 for 3 min; level 3 BLEs only 2 min;  8 min  total Hamstring stretch LLE strap SLR with PT manual assist knee ext 30 sec hold 2 reps Gastroc stretch straddle step wt shift with 4 quad sets 5 sec hold stretch hold 30 sec 3 reps ea LE Squat at sink over chair 10 reps cues on technique Quad stretch standing knee flex w/ towel 30 sec hold 2 reps Heel raises without UE support 15 reps Bridge moving feet closer on rep 3 & 5,  6 reps 3 sets 5 sec hold including flexion stretch  Functional Activities: Double Leg Press 112# 15X slow eccentrics and full extension with PT manual technique for ext.  Single Leg Press RLE 56# 15X slow eccentrics and full extension  Standing on foam RLE SLS reaching LLE towards cones with knee flexion outward & ext inward motion 5 cones (anterior across midline, ant-lat, lat, post-lat, posterior across midline)  BUE support (sink & cane)  Manual therapy: PROM with overpressure for flexion seated and extension supine / leg press  Vaso right knee Medium 34* 10 minutes with elevation   PATIENT EDUCATION:  Education details: Reviewed exam findings and home exercises Person educated: Patient and Spouse Education method: Explanation, Demonstration, Tactile cues, Verbal cues, and Handouts Education comprehension: verbalized understanding, returned demonstration, verbal cues required, tactile cues required, and needs further education   HOME  EXERCISE PROGRAM: Access Code: [email or web address] URL: [email or web address] Date: 05/04/2022 Prepared by: Jamey Reas  Exercises - Supine Quadricep Sets  - 5 x daily - 7 x weekly - 2 sets - 10 reps - 5 second hold - Seated Knee Flexion AAROM  - 3-5 x daily - 7 x weekly - 1 sets - 1 reps - 3 minutes hold - Prone Knee Extension with Ankle Weight  - 2 x daily - 7 x weekly - 3-5 minutes hold - Gastroc Stretch on Step  - 2 x daily - 7 x weekly - 1 sets - 2-3 reps - 30 seconds hold - Standing Quad Stretch with Strap  - 2 x daily - 7 x weekly - 1 sets - 2-3 reps - 30 seconds hold - Seated Table Hamstring Stretch  - 2 x daily - 7 x weekly - 1 sets - 2-3 reps - 30 seconds hold   ASSESSMENT:  CLINICAL IMPRESSION: AROM (flexion emphasis), quadriceps strength and edema control remain the emphasis with Mathieu's supervised and home exercises.  We started the transition into more balance and functional activities and will continue this as flexion AROM continues to progress.   OBJECTIVE IMPAIRMENTS: Abnormal gait, decreased activity tolerance, decreased balance, decreased endurance, and decreased knowledge of condition.    ACTIVITY LIMITATIONS: carrying, bending, sitting, standing, squatting, sleeping, stairs, and locomotion level   PARTICIPATION LIMITATIONS: driving, community activity, and yard work   PERSONAL FACTORS: Bil knee OA, Lt ear hearing loss, h/o seizures, Lt TKA 05/12/2021, HLD are also affecting patient's functional outcome.    REHAB POTENTIAL: Good   CLINICAL DECISION MAKING: Stable/uncomplicated   EVALUATION COMPLEXITY: Low     GOALS: Goals reviewed with patient? Yes   SHORT TERM GOALS: Target date: 05/15/2022 Improve right knee AROM for extension to -5 and flexion to 90 degrees Baseline: 13-0-68 degrees Goal status: Met 05/01/2022   2.  Epifanio will be independent with his day 1 home exercise program Baseline: Started 04/17/2022 Goal status: Met 04/28/2022     LONG  TERM GOALS: Target date: 06/12/2022   Improve FOTO to 62 Baseline: 47 Goal status: On Going 05/08/2022   2.  Izak will report right  knee pain consistently 0-3 out of 10 on the visual analog scale Baseline: 3-7 out of 10 Goal status: On Going 05/20/2022   3.  Improve right knee AROM to 2-0-100 Baseline: 13-0-68 Goal status: Partially Met (close) 05/13/2022   4.  Improve right quadriceps strength to at least 60% of the uninvolved left Baseline: 22% Goal status: On Going 05/20/2022   5.  Battal will be able to walk 500 feet without a cane and manage 5 steps with subjective reports of no problem Baseline: Using a cane and has to use the handrail with 2 steps at home Goal status: Partially Met 05/20/2022   6.  Brittney will be independent with his long-term home exercise program at discharge Baseline: Started 04/17/2022 Goal status: On Going 05/20/2022     PLAN:   PT FREQUENCY:  3 times a week for 2 weeks then switch to twice a week   PT DURATION: 8 weeks   PLANNED INTERVENTIONS: Therapeutic exercises, Therapeutic activity, Neuromuscular re-education, Balance training, Gait training, Patient/Family education, Self Care, Joint mobilization, Stair training, Cryotherapy, Vasopneumatic device, and Manual therapy   PLAN FOR NEXT SESSION: Continue with AROM, quadriceps strengthening, functional progressions and balance.   Farley Ly, PT, MPT 05/20/2022, 3:10 PM

## 2022-05-22 ENCOUNTER — Ambulatory Visit (INDEPENDENT_AMBULATORY_CARE_PROVIDER_SITE_OTHER): Payer: PPO | Admitting: Rehabilitative and Restorative Service Providers"

## 2022-05-22 ENCOUNTER — Encounter: Payer: Self-pay | Admitting: Rehabilitative and Restorative Service Providers"

## 2022-05-22 DIAGNOSIS — R262 Difficulty in walking, not elsewhere classified: Secondary | ICD-10-CM

## 2022-05-22 DIAGNOSIS — M25561 Pain in right knee: Secondary | ICD-10-CM

## 2022-05-22 DIAGNOSIS — R600 Localized edema: Secondary | ICD-10-CM

## 2022-05-22 DIAGNOSIS — M6281 Muscle weakness (generalized): Secondary | ICD-10-CM

## 2022-05-22 DIAGNOSIS — M25661 Stiffness of right knee, not elsewhere classified: Secondary | ICD-10-CM

## 2022-05-22 NOTE — Therapy (Signed)
OUTPATIENT PHYSICAL THERAPY TREATMENT NOTE  Patient Name: Ricky Barton MRN: [id number] DOB:1955/11/14, 67 y.o., male Today's Date: 05/22/2022  PCP: Ria Bush, MD  REFERRING PROVIDER: Leandrew Koyanagi, MD  END OF SESSION:   PT End of Session - 05/22/22 0853     Visit Number 13    Number of Visits 16    Date for PT Re-Evaluation 06/12/22    Authorization Type Ortho bundle    Authorization - Visit Number 13    Authorization - Number of Visits 16    Progress Note Due on Visit 16    PT Start Time [id number]    PT Stop Time 0936    PT Time Calculation (min) 49 min    Activity Tolerance Patient tolerated treatment well;Patient limited by pain;No increased pain    Behavior During Therapy [facility] for tasks assessed/performed              Past Medical History:  Diagnosis Date   Arthritis    bilateral knees   Hypoglycemic syndrome    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   COLONOSCOPY  07/2016   WNL, rpt 5 yrs Fuller Plan)   COLONOSCOPY  11/2021   2 HPs, rpt 7 yrs Fuller Plan)   Head MRI  08/2000   POLYPECTOMY  2013   TA"s x 3   TOTAL KNEE ARTHROPLASTY Left 05/12/2021   Procedure: LEFT TOTAL KNEE ARTHROPLASTY;  Surgeon: Leandrew Koyanagi, MD;  Location: [place];  Service: Orthopedics;  Laterality: Left;   TOTAL KNEE ARTHROPLASTY Right 04/03/2022   Procedure: RIGHT TOTAL KNEE ARTHROPLASTY;  Surgeon: Leandrew Koyanagi, MD;  Location: [place];  Service: Orthopedics;  Laterality: Right;   Patient Active Problem List   Diagnosis Date Noted   Status post total right knee replacement 04/03/2022   Primary osteoarthritis of right knee 02/04/2022   Primary  osteoarthritis of left knee 05/12/2021   Status post total left knee replacement 05/12/2021   Allergic rhinitis 12/09/2020   Low serum vitamin B12 12/07/2020   Welcome to Medicare preventive visit 12/06/2020   Advanced directives, counseling/discussion 12/06/2020   Angular cheilitis 12/06/2020   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   Elevated blood pressure reading without diagnosis of hypertension 11/10/2018   Renal lesion 03/28/2018   Arthralgia of hands, bilateral 09/12/2015   OSA on CPAP 09/12/2015   Obesity, Class I, BMI 30-34.9 04/05/2014   Hyperglycemia 03/30/2013   Healthcare maintenance 03/04/2012   HLD (hyperlipidemia) 01/18/2009   ERECTILE DYSFUNCTION, 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  He notes he has returned to the gym with no problems, although he can't complete a full revolution on the bike.  PAIN:  Are you having pain? Yes: NPRS scale: 2-8/10 this week Pain location: Right knee Pain description: Ache, tight and can be sharp Aggravating factors: Prolonged postures and too much weightbearing Relieving factors: Ice, pain medication and exercise   OBJECTIVE: (objective measures completed at initial evaluation  unless otherwise dated)  OBJECTIVE:    DIAGNOSTIC FINDINGS: IMPRESSION: 1. Right total knee arthroplasty without evidence of acute postoperative complication.   PATIENT SURVEYS:  05/08/2021: FOTO 52 (Goal 62)  EVAL: FOTO 47 (Goal 62 in 12 visits)   EDEMA:  Noted and not objectively assessed   LOWER EXTREMITY ROM:   ROM P:passive  A:active Right 04/17/22 Left 04/17/22 Right 04/20/22 Right 04/23/22 Right 04/28/22 Right 05/01/22 Right 05/04/22 Right 05/08/22 AROM Right 05/11/22 Left/Right in degrees 05/13/2022 Right 05/22/2022  Knee flexion A: 68 A; 106 Seated P: 85* A: 80* Supine AA: 93 Active 95 Active 94 Seated A: 94* P: 97* Supine 98 Supine A: 91* P: 97* 109/100 With belt 105  Knee extension A: -13 A: -2 Seated P: -5* A: quad set -7*  Active -5 Active -3 Seated A: LAQ -12* Standing A: -3* Supine -3   Supine Quad set A: -4* P: -2* 0/-3 -3 (between 2 and 3)   (Blank rows = not tested)   LOWER EXTREMITY STRENGTH:   MMT Right 04/17/2022 Left 04/17/2022 Right 05/08/2022  Knee flexion       Knee extension 19.3 pounds 87.2 pounds 37.2 pounds   (Blank rows = not tested)   GAIT: Distance walked: Within the clinic Assistive device utilized: Single point cane Level of assistance: Complete Independence Comments: Jamesjoseph would like to return to walking without the cane     TODAY'S TREATMENT:                                                                        DATE:  SciFit bike Seat 12 Level 4.5 for 8 minutes Knee flexion with strap supine 10X 10 seconds and PT overpressure Seated knee flexion (left pushes right into flexion) 2 sets of 10 for 10 seconds, PT overpressure on last 10 Quadriceps sets with heel prop 2 sets of 10 for 5 seconds Prone knee extension stretch 4 minutes with 1.5# Hip hike at countertop 2 sets of 10 3 seconds alternating Looked at gait-significant for mild right lateral lean (hip abductors weakness)  Vaso right knee Medium 34* 10  minutes   05/20/2022: SciFit bike Seat 12 Level 4.5 for 8 minutes Knee flexion with strap supine 10X 10 seconds and PT overpressure Seated knee flexion (left pushes right into flexion) 10X 10 seconds, PT overpressure on all 10 Quadriceps sets with heel prop 2 sets of 10 for 5 seconds Prone knee extension stretch 4 minutes with 1.5#  Functional Activities:  Step-down off 4 inch step 10X slow eccentrics Step-up and over 6 inch step 10X slow eccentrics  Vaso right knee Medium 34* 10 minutes   05/13/2022: SciFit bike Seat 12 Level 4.5 for 8 minutes Knee flexion with strap supine 10X 10 seconds and PT overpressure  Seated knee flexion (left pushes right into flexion) 10X 10 seconds, PT overpressure on all 10 Quadriceps sets with heel prop 2 sets of 10 for 5 seconds Prone knee extension stretch 4 minutes with 1.5#  Functional Activities: Diagonal squat lift and golfer's lift- added because Qwinton noted he can squat, but he sometimes can't get back up without help  Vaso right knee Medium 34* 10 minutes   PATIENT EDUCATION:  Education details: Reviewed exam findings and home exercises Person educated: Patient and Spouse Education method: Explanation, Demonstration, Tactile cues, Verbal cues, and Handouts Education comprehension: verbalized understanding, returned demonstration, verbal cues required, tactile cues required, and needs further education   HOME EXERCISE PROGRAM: Access Code: [email or web address] URL: [email or web address] Date: 05/04/2022 Prepared by: Jamey Reas  Exercises - Supine Quadricep Sets  - 5 x daily - 7 x weekly - 2 sets - 10 reps - 5 second hold - Seated Knee Flexion AAROM  - 3-5 x daily - 7 x weekly - 1 sets - 1 reps - 3 minutes hold - Prone Knee Extension with Ankle Weight  - 2 x daily - 7 x weekly - 3-5 minutes hold - Gastroc Stretch on Step  - 2 x daily - 7 x weekly - 1 sets - 2-3 reps - 30 seconds hold - Standing Quad Stretch with Strap  - 2 x daily  - 7 x weekly - 1 sets - 2-3 reps - 30 seconds hold - Seated Table Hamstring Stretch  - 2 x daily - 7 x weekly - 1 sets - 2-3 reps - 30 seconds hold   ASSESSMENT:  CLINICAL IMPRESSION: AROM (flexion emphasis), quadriceps strength and edema control remain the emphasis with Rachit's home exercises.  We will focus more on gait, balance and functional activities in the clinic over his next 2-3 visits.   OBJECTIVE IMPAIRMENTS: Abnormal gait, decreased activity tolerance, decreased balance, decreased endurance, and decreased knowledge of condition.    ACTIVITY LIMITATIONS: carrying, bending, sitting, standing, squatting, sleeping, stairs, and locomotion level   PARTICIPATION LIMITATIONS: driving, community activity, and yard work   PERSONAL FACTORS: Bil knee OA, Lt ear hearing loss, h/o seizures, Lt TKA 05/12/2021, HLD are also affecting patient's functional outcome.    REHAB POTENTIAL: Good   CLINICAL DECISION MAKING: Stable/uncomplicated   EVALUATION COMPLEXITY: Low     GOALS: Goals reviewed with patient? Yes   SHORT TERM GOALS: Target date: 05/15/2022 Improve right knee AROM for extension to -5 and flexion to 90 degrees Baseline: 13-0-68 degrees Goal status: Met 05/01/2022   2.  Yann will be independent with his day 1 home exercise program Baseline: Started 04/17/2022 Goal status: Met 04/28/2022     LONG TERM GOALS: Target date: 06/12/2022   Improve FOTO to 62 Baseline: 47 Goal status: On Going 05/08/2022   2.  Adrien will report right knee pain consistently 0-3 out of 10 on the visual analog scale Baseline: 3-7 out of 10 Goal status: On Going 05/22/2022   3.  Improve right knee AROM to 2-0-100 Baseline: 13-0-68 Goal status: Partially Met (close) 05/21/2022   4.  Improve right quadriceps strength to at least 60% of the uninvolved left Baseline: 22% Goal status: On Going 05/22/2022   5.  Tavita will be able to walk 500 feet without a cane and manage 5 steps with subjective  reports of no problem Baseline: Using a cane and has to use the handrail with 2 steps at home Goal status: Partially Met 05/22/2022   6.  Saralyn Pilar  will be independent with his long-term home exercise program at discharge Baseline: Started 04/17/2022 Goal status: On Going 05/22/2022     PLAN:   PT FREQUENCY:  3 times a week for 2 weeks then switch to twice a week   PT DURATION: 8 weeks   PLANNED INTERVENTIONS: Therapeutic exercises, Therapeutic activity, Neuromuscular re-education, Balance training, Gait training, Patient/Family education, Self Care, Joint mobilization, Stair training, Cryotherapy, Vasopneumatic device, and Manual therapy   PLAN FOR NEXT SESSION: Increased emphasis on quadriceps strengthening, gait, balance and functional progressions.   Farley Ly, PT, MPT 05/22/2022, 9:35 AM

## 2022-05-25 ENCOUNTER — Encounter: Payer: Self-pay | Admitting: Physical Therapy

## 2022-05-25 ENCOUNTER — Ambulatory Visit (INDEPENDENT_AMBULATORY_CARE_PROVIDER_SITE_OTHER): Payer: PPO | Admitting: Physical Therapy

## 2022-05-25 DIAGNOSIS — M25661 Stiffness of right knee, not elsewhere classified: Secondary | ICD-10-CM | POA: Diagnosis not present

## 2022-05-25 DIAGNOSIS — M6281 Muscle weakness (generalized): Secondary | ICD-10-CM | POA: Diagnosis not present

## 2022-05-25 DIAGNOSIS — R600 Localized edema: Secondary | ICD-10-CM

## 2022-05-25 DIAGNOSIS — M25561 Pain in right knee: Secondary | ICD-10-CM

## 2022-05-25 DIAGNOSIS — R262 Difficulty in walking, not elsewhere classified: Secondary | ICD-10-CM | POA: Diagnosis not present

## 2022-05-25 NOTE — Therapy (Signed)
OUTPATIENT PHYSICAL THERAPY TREATMENT NOTE  Patient Name: Ricky Barton MRN: [id number] DOB:1955/11/08, 67 y.o., male Today's Date: 05/25/2022  PCP: Ria Bush, MD  REFERRING PROVIDER: Leandrew Koyanagi, MD  END OF SESSION:   PT End of Session - 05/25/22 0850     Visit Number 14    Number of Visits 16    Date for PT Re-Evaluation 06/12/22    Authorization Type Ortho bundle    Authorization - Number of Visits 16    Progress Note Due on Visit 16    PT Start Time 0846    PT Stop Time 0940    PT Time Calculation (min) 54 min    Activity Tolerance Patient tolerated treatment well;Patient limited by pain;No increased pain    Behavior During Therapy [facility] for tasks assessed/performed               Past Medical History:  Diagnosis Date   Arthritis    bilateral knees   Hypoglycemic syndrome    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   COLONOSCOPY  07/2016   WNL, rpt 5 yrs Fuller Plan)   COLONOSCOPY  11/2021   2 HPs, rpt 7 yrs Fuller Plan)   Head MRI  08/2000   POLYPECTOMY  2013   TA"s x 3   TOTAL KNEE ARTHROPLASTY Left 05/12/2021   Procedure: LEFT TOTAL KNEE ARTHROPLASTY;  Surgeon: Leandrew Koyanagi, MD;  Location: [place];  Service: Orthopedics;  Laterality: Left;   TOTAL KNEE ARTHROPLASTY Right 04/03/2022   Procedure: RIGHT TOTAL KNEE ARTHROPLASTY;  Surgeon: Leandrew Koyanagi, MD;  Location: [place];  Service: Orthopedics;  Laterality: Right;   Patient Active Problem List   Diagnosis Date Noted   Status post total right knee replacement 04/03/2022   Primary osteoarthritis of right knee 02/04/2022   Primary osteoarthritis of left knee  05/12/2021   Status post total left knee replacement 05/12/2021   Allergic rhinitis 12/09/2020   Low serum vitamin B12 12/07/2020   Welcome to Medicare preventive visit 12/06/2020   Advanced directives, counseling/discussion 12/06/2020   Angular cheilitis 12/06/2020   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   Elevated blood pressure reading without diagnosis of hypertension 11/10/2018   Renal lesion 03/28/2018   Arthralgia of hands, bilateral 09/12/2015   OSA on CPAP 09/12/2015   Obesity, Class I, BMI 30-34.9 04/05/2014   Hyperglycemia 03/30/2013   Healthcare maintenance 03/04/2012   HLD (hyperlipidemia) 01/18/2009   ERECTILE DYSFUNCTION, 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   History of seizure 08/15/1991    REFERRING DIAG: M17.11 (ICD-10-CM) - Primary osteoarthritis of right knee   ONSET DATE: R knee TKA 04/03/2022   THERAPY DIAG:  Muscle weakness (generalized)  Localized edema  Difficulty in walking, not elsewhere classified  Stiffness of right knee, not elsewhere classified  Right knee pain, unspecified chronicity  Rationale for Evaluation and Treatment Rehabilitation  PERTINENT HISTORY: Bil knee OA, Lt ear hearing loss, h/o seizures, Lt TKA 05/12/2021, HLD   PRECAUTIONS: None  SUBJECTIVE:  SUBJECTIVE STATEMENT:  He went to a couple of basketball games and close seating was difficult to bend.  He had to sleep in recliner partially due to knee & some what wife snoring.   PAIN:  Are you having pain? Yes: NPRS scale: 2-8/10 this week Pain location: Right knee Pain description: Ache, tight and can be sharp Aggravating factors: Prolonged postures and too much weightbearing Relieving factors: Ice, pain medication and exercise   OBJECTIVE: (objective measures completed at initial evaluation unless otherwise  dated)  OBJECTIVE:    DIAGNOSTIC FINDINGS: IMPRESSION: 1. Right total knee arthroplasty without evidence of acute postoperative complication.   PATIENT SURVEYS:  05/08/2021: FOTO 52 (Goal 62)  EVAL: FOTO 47 (Goal 62 in 12 visits)   EDEMA:  Noted and not objectively assessed   LOWER EXTREMITY ROM:   ROM P:passive  A:active Right 04/17/22 Left 04/17/22 Right 04/20/22 Right 04/23/22 Right 04/28/22 Right 05/01/22 Right 05/04/22 Right 05/08/22 AROM Right 05/11/22 Left/Right in degrees 05/13/2022 Right 05/22/2022 Right 05/25/22  Knee flexion A: 68 A; 106 Seated P: 85* A: 80* Supine AA: 93 Active 95 Active 94 Seated A: 94* P: 97* Supine 98 Supine A: 91* P: 97* 109/100 With belt 105 Seated A: 98* P: 106*  Knee extension A: -13 A: -2 Seated P: -5* A: quad set -7*  Active -5 Active -3 Seated A: LAQ -12* Standing A: -3* Supine -3   Supine Quad set A: -4* P: -2* 0/-3 -3 (between 2 and 3)    (Blank rows = not tested)   LOWER EXTREMITY STRENGTH:   MMT Right 04/17/2022 Left 04/17/2022 Right 05/08/2022  Knee flexion       Knee extension 19.3 pounds 87.2 pounds 37.2 pounds   (Blank rows = not tested)   GAIT: Distance walked: Within the clinic Assistive device utilized: Single point cane Level of assistance: Complete Independence Comments: Toivo would like to return to walking without the cane     TODAY'S TREATMENT:                                                                        DATE:  05/25/2022: Therapeutic Exercise: Precor recumbent bike seat 6 rocking for flexion stretch 3-5 sec for 8 min. Standing knee flexion stretch with RLE at edge of chair forward lean & hamstring stretch 10 sec hold ea moving LLE stance limb closer for 15 reps total.  Pt verbalizes as option for HEP. Squat at sink over chair with PT demo & verbal cues technique.  Pt performed 20 reps.   Therapeutic Activities: Stairs descend step-to flexing Rt knee 5 steps 2 rails, 3 steps contralateral rail & 3  steps ipsilateral rail.  Pt ascend alternating pattern with left rail.  PT demo & verbal cues on technique.  Leg press BLEs 100# 15 reps with focus on full knee ext & max flexion 5 sec hold ea;  RLE only 50# 15 reps.  Manual therapy: PROM ext stretch during rest on leg press.  Flexion PROM with overpressure seated. Hypervolt to quads during stretch.   Vaso right knee Medium 34* 10 minutes with elevation   05/22/2022: SciFit bike Seat 12 Level 4.5 for 8 minutes Knee flexion with strap supine 10X 10 seconds and  PT overpressure Seated knee flexion (left pushes right into flexion) 2 sets of 10 for 10 seconds, PT overpressure on last 10 Quadriceps sets with heel prop 2 sets of 10 for 5 seconds Prone knee extension stretch 4 minutes with 1.5# Hip hike at countertop 2 sets of 10 3 seconds alternating Looked at gait-significant for mild right lateral lean (hip abductors weakness)  Vaso right knee Medium 34* 10 minutes   05/20/2022: SciFit bike Seat 12 Level 4.5 for 8 minutes Knee flexion with strap supine 10X 10 seconds and PT overpressure Seated knee flexion (left pushes right into flexion) 10X 10 seconds, PT overpressure on all 10 Quadriceps sets with heel prop 2 sets of 10 for 5 seconds Prone knee extension stretch 4 minutes with 1.5#  Functional Activities:  Step-down off 4 inch step 10X slow eccentrics Step-up and over 6 inch step 10X slow eccentrics  Vaso right knee Medium 34* 10 minutes   PATIENT EDUCATION:  Education details: Reviewed exam findings and home exercises Person educated: Patient and Spouse Education method: Explanation, Demonstration, Tactile cues, Verbal cues, and Handouts Education comprehension: verbalized understanding, returned demonstration, verbal cues required, tactile cues required, and needs further education   HOME EXERCISE PROGRAM: Access Code: [email or web address] URL: [email or web address] Date: 05/04/2022 Prepared by: Jamey Reas  Exercises - Supine Quadricep Sets  - 5 x daily - 7 x weekly - 2 sets - 10 reps - 5 second hold - Seated Knee Flexion AAROM  - 3-5 x daily - 7 x weekly - 1 sets - 1 reps - 3 minutes hold - Prone Knee Extension with Ankle Weight  - 2 x daily - 7 x weekly - 3-5 minutes hold - Gastroc Stretch on Step  - 2 x daily - 7 x weekly - 1 sets - 2-3 reps - 30 seconds hold - Standing Quad Stretch with Strap  - 2 x daily - 7 x weekly - 1 sets - 2-3 reps - 30 seconds hold - Seated Table Hamstring Stretch  - 2 x daily - 7 x weekly - 1 sets - 2-3 reps - 30 seconds hold   ASSESSMENT:  CLINICAL IMPRESSION: Patient improved active knee flexion during session with progressive activities.  He improved knee flexion RLE descending stairs with instruction.  Patient's knee flexion still will not permit full revolutions on standard recumbent bike but he understands how to stretch on bike. Pt continues to benefit from skilled PT.    OBJECTIVE IMPAIRMENTS: Abnormal gait, decreased activity tolerance, decreased balance, decreased endurance, and decreased knowledge of condition.    ACTIVITY LIMITATIONS: carrying, bending, sitting, standing, squatting, sleeping, stairs, and locomotion level   PARTICIPATION LIMITATIONS: driving, community activity, and yard work   PERSONAL FACTORS: Bil knee OA, Lt ear hearing loss, h/o seizures, Lt TKA 05/12/2021, HLD are also affecting patient's functional outcome.    REHAB POTENTIAL: Good   CLINICAL DECISION MAKING: Stable/uncomplicated   EVALUATION COMPLEXITY: Low     GOALS: Goals reviewed with patient? Yes   SHORT TERM GOALS: Target date: 05/15/2022 Improve right knee AROM for extension to -5 and flexion to 90 degrees Baseline: 13-0-68 degrees Goal status: Met 05/01/2022   2.  Jarnell will be independent with his day 1 home exercise program Baseline: Started 04/17/2022 Goal status: Met 04/28/2022     LONG TERM GOALS: Target date: 06/12/2022   Improve FOTO to  62 Baseline: 47 Goal status: On Going 05/08/2022   2.  Javed will report right knee pain consistently 0-3 out of  10 on the visual analog scale Baseline: 3-7 out of 10 Goal status: On Going 05/22/2022   3.  Improve right knee AROM to 2-0-100 Baseline: 13-0-68 Goal status: Partially Met (close) 05/21/2022   4.  Improve right quadriceps strength to at least 60% of the uninvolved left Baseline: 22% Goal status: On Going 05/22/2022   5.  Tru will be able to walk 500 feet without a cane and manage 5 steps with subjective reports of no problem Baseline: Using a cane and has to use the handrail with 2 steps at home Goal status: Partially Met 05/22/2022   6.  Meron will be independent with his long-term home exercise program at discharge Baseline: Started 04/17/2022 Goal status: On Going 05/22/2022     PLAN:   PT FREQUENCY:  3 times a week for 2 weeks then switch to twice a week   PT DURATION: 8 weeks   PLANNED INTERVENTIONS: Therapeutic exercises, Therapeutic activity, Neuromuscular re-education, Balance training, Gait training, Patient/Family education, Self Care, Joint mobilization, Stair training, Cryotherapy, Vasopneumatic device, and Manual therapy   PLAN FOR NEXT SESSION:  Manual therapy & exercises for range & strength with balance and functional progressions.   Jamey Reas, PT, DPT 05/25/2022, 2:17 PM

## 2022-05-27 ENCOUNTER — Ambulatory Visit (INDEPENDENT_AMBULATORY_CARE_PROVIDER_SITE_OTHER): Payer: PPO | Admitting: Rehabilitative and Restorative Service Providers"

## 2022-05-27 ENCOUNTER — Encounter: Payer: Self-pay | Admitting: Rehabilitative and Restorative Service Providers"

## 2022-05-27 DIAGNOSIS — M25661 Stiffness of right knee, not elsewhere classified: Secondary | ICD-10-CM | POA: Diagnosis not present

## 2022-05-27 DIAGNOSIS — R262 Difficulty in walking, not elsewhere classified: Secondary | ICD-10-CM

## 2022-05-27 DIAGNOSIS — R600 Localized edema: Secondary | ICD-10-CM

## 2022-05-27 DIAGNOSIS — M25561 Pain in right knee: Secondary | ICD-10-CM

## 2022-05-27 DIAGNOSIS — M6281 Muscle weakness (generalized): Secondary | ICD-10-CM

## 2022-05-27 NOTE — Therapy (Signed)
OUTPATIENT PHYSICAL THERAPY TREATMENT NOTE  Patient Name: Ricky Barton MRN: [id number] DOB:April 25, 1955, 67 y.o., male Today's Date: 05/27/2022  PCP: Ria Bush, MD  REFERRING PROVIDER: Leandrew Koyanagi, MD  END OF SESSION:   PT End of Session - 05/27/22 1350     Visit Number 15    Number of Visits 16    Date for PT Re-Evaluation 06/12/22    Authorization Type Ortho bundle    Authorization - Visit Number 15    Authorization - Number of Visits 16    Progress Note Due on Visit 16    PT Start Time [id number]    PT Stop Time 1436    PT Time Calculation (min) 51 min    Activity Tolerance Patient tolerated treatment well;Patient limited by pain;No increased pain    Behavior During Therapy [facility] for tasks assessed/performed             Past Medical History:  Diagnosis Date   Arthritis    bilateral knees   Hypoglycemic syndrome    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   COLONOSCOPY  07/2016   WNL, rpt 5 yrs Fuller Plan)   COLONOSCOPY  11/2021   2 HPs, rpt 7 yrs Fuller Plan)   Head MRI  08/2000   POLYPECTOMY  2013   TA"s x 3   TOTAL KNEE ARTHROPLASTY Left 05/12/2021   Procedure: LEFT TOTAL KNEE ARTHROPLASTY;  Surgeon: Leandrew Koyanagi, MD;  Location: [place];  Service: Orthopedics;  Laterality: Left;   TOTAL KNEE ARTHROPLASTY Right 04/03/2022   Procedure: RIGHT TOTAL KNEE ARTHROPLASTY;  Surgeon: Leandrew Koyanagi, MD;  Location: [place];  Service: Orthopedics;  Laterality: Right;   Patient Active Problem List   Diagnosis Date Noted   Status post total right knee replacement 04/03/2022   Primary osteoarthritis of right knee 02/04/2022   Primary  osteoarthritis of left knee 05/12/2021   Status post total left knee replacement 05/12/2021   Allergic rhinitis 12/09/2020   Low serum vitamin B12 12/07/2020   Welcome to Medicare preventive visit 12/06/2020   Advanced directives, counseling/discussion 12/06/2020   Angular cheilitis 12/06/2020   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   Elevated blood pressure reading without diagnosis of hypertension 11/10/2018   Renal lesion 03/28/2018   Arthralgia of hands, bilateral 09/12/2015   OSA on CPAP 09/12/2015   Obesity, Class I, BMI 30-34.9 04/05/2014   Hyperglycemia 03/30/2013   Healthcare maintenance 03/04/2012   HLD (hyperlipidemia) 01/18/2009   ERECTILE DYSFUNCTION, 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   History of seizure 08/15/1991    REFERRING DIAG: M17.11 (ICD-10-CM) - Primary osteoarthritis of right knee   ONSET DATE: R knee TKA 04/03/2022   THERAPY DIAG:  Muscle weakness (generalized)  Localized edema  Difficulty in walking, not elsewhere classified  Stiffness of right knee, not elsewhere classified  Right knee pain, unspecified chronicity  Rationale for Evaluation and Treatment Rehabilitation  PERTINENT HISTORY: Bil knee OA, Lt ear hearing loss, h/o seizures, Lt TKA 05/12/2021, HLD   PRECAUTIONS: None  SUBJECTIVE:  SUBJECTIVE STATEMENT: Ricky Barton went to the gym one time and was unable to get around a full cycle on the bike.  He still needs the pain meds to sleep.  PAIN:  Are you having pain? Yes: NPRS scale: 2-8/10 this week Pain location: Right knee Pain description: Ache, tight and can be sharp Aggravating factors: Prolonged postures and too much weightbearing Relieving factors: Ice, pain medication and exercise   OBJECTIVE: (objective measures completed at initial evaluation unless otherwise  dated)  OBJECTIVE:    DIAGNOSTIC FINDINGS: IMPRESSION: 1. Right total knee arthroplasty without evidence of acute postoperative complication.   PATIENT SURVEYS:  05/08/2021: FOTO 52 (Goal 62)  EVAL: FOTO 47 (Goal 62 in 12 visits)   EDEMA:  Noted and not objectively assessed   LOWER EXTREMITY ROM:   ROM P:passive  A:active Right 04/17/22 Left 04/17/22 Right 04/20/22 Right 04/23/22 Right 04/28/22 Right 05/01/22 Right 05/04/22 Right 05/08/22 AROM Right 05/11/22 Left/Right in degrees 05/13/2022 Right 05/22/2022 Right 05/25/22 Right 05/27/22  Knee flexion A: 68 A; 106 Seated P: 85* A: 80* Supine AA: 93 Active 95 Active 94 Seated A: 94* P: 97* Supine 98 Supine A: 91* P: 97* 109/100 With belt 105 Seated A: 98* P: 106* Active 101  Knee extension A: -13 A: -2 Seated P: -5* A: quad set -7*  Active -5 Active -3 Seated A: LAQ -12* Standing A: -3* Supine -3   Supine Quad set A: -4* P: -2* 0/-3 -3 (between 2 and 3)  Active -2   (Blank rows = not tested)   LOWER EXTREMITY STRENGTH:   MMT Right 04/17/2022 Left 04/17/2022 Right 05/08/2022  Knee flexion       Knee extension 19.3 pounds 87.2 pounds 37.2 pounds   (Blank rows = not tested)   GAIT: Distance walked: Within the clinic Assistive device utilized: Single point cane Level of assistance: Complete Independence Comments: Ricky Barton would like to return to walking without the cane     TODAY'S TREATMENT:                                                                        DATE:  05/26/2021 Recumbent bike Seat 7 for 8 minutes AAROM to full range 10X backwards and 5X forwards Seated knee flexion (left pushes right into flexion) 2 sets of 10 for 10 seconds, PT overpressure on last 10 Prone knee extension stretch 4 minutes with 1.5# Quadriceps sets with right heel prop 10X 5 seconds Hip hike at countertop 2 sets of 10 3 seconds alternating  Functional Activities (for stairs, sit to stand): Double Leg Press 150# 15X slow eccentrics full  range Single Leg Press   Vaso right knee Medium 34* 10 minutes with elevation   05/25/2022: Therapeutic Exercise: Precor recumbent bike seat 6 rocking for flexion stretch 3-5 sec for 8 min. Standing knee flexion stretch with RLE at edge of chair forward lean & hamstring stretch 10 sec hold ea moving LLE stance limb closer for 15 reps total.  Pt verbalizes as option for HEP. Squat at sink over chair with PT demo & verbal cues technique.  Pt performed 20 reps.   Therapeutic Activities: Stairs descend step-to flexing Rt knee 5 steps 2 rails, 3 steps contralateral rail & 3  steps ipsilateral rail.  Pt ascend alternating pattern with left rail.  PT demo & verbal cues on technique.  Leg press BLEs 100# 15 reps with focus on full knee ext & max flexion 5 sec hold ea;  RLE only 50# 15 reps.  Manual therapy: PROM ext stretch during rest on leg press.  Flexion PROM with overpressure seated. Hypervolt to quads during stretch.   Vaso right knee Medium 34* 10 minutes with elevation   05/22/2022: SciFit bike Seat 12 Level 4.5 for 8 minutes Knee flexion with strap supine 10X 10 seconds and PT overpressure Seated knee flexion (left pushes right into flexion) 2 sets of 10 for 10 seconds, PT overpressure on last 10 Quadriceps sets with heel prop 2 sets of 10 for 5 seconds Prone knee extension stretch 4 minutes with 1.5# Hip hike at countertop 2 sets of 10 3 seconds alternating Looked at gait-significant for mild right lateral lean (hip abductors weakness)  Vaso right knee Medium 34* 10 minutes   PATIENT EDUCATION:  Education details: Reviewed exam findings and home exercises Person educated: Patient and Spouse Education method: Explanation, Demonstration, Tactile cues, Verbal cues, and Handouts Education comprehension: verbalized understanding, returned demonstration, verbal cues required, tactile cues required, and needs further education   HOME EXERCISE PROGRAM: Access Code: [email or web address] URL:  [email or web address] Date: 05/04/2022 Prepared by: Ricky Barton  Exercises - Supine Quadricep Sets  - 5 x daily - 7 x weekly - 2 sets - 10 reps - 5 second hold - Seated Knee Flexion AAROM  - 3-5 x daily - 7 x weekly - 1 sets - 1 reps - 3 minutes hold - Prone Knee Extension with Ankle Weight  - 2 x daily - 7 x weekly - 3-5 minutes hold - Gastroc Stretch on Step  - 2 x daily - 7 x weekly - 1 sets - 2-3 reps - 30 seconds hold - Standing Quad Stretch with Strap  - 2 x daily - 7 x weekly - 1 sets - 2-3 reps - 30 seconds hold - Seated Table Hamstring Stretch  - 2 x daily - 7 x weekly - 1 sets - 2-3 reps - 30 seconds hold   ASSESSMENT:  CLINICAL IMPRESSION: Ricky Barton reports he has not used his cane in "at least" 2 weeks.  He still needs his prescription pain pills at night, but AROM was 2-0-101 today.  After warm-up, he was able to get a complete cycle on the bike 15 times.  We will consider DC if Ricky Barton expresses comfort with his HEP and return to the gym next week.     OBJECTIVE IMPAIRMENTS: Abnormal gait, decreased activity tolerance, decreased balance, decreased endurance, and decreased knowledge of condition.    ACTIVITY LIMITATIONS: carrying, bending, sitting, standing, squatting, sleeping, stairs, and locomotion level   PARTICIPATION LIMITATIONS: driving, community activity, and yard work   PERSONAL FACTORS: Bil knee OA, Lt ear hearing loss, h/o seizures, Lt TKA 05/12/2021, HLD are also affecting patient's functional outcome.    REHAB POTENTIAL: Good   CLINICAL DECISION MAKING: Stable/uncomplicated   EVALUATION COMPLEXITY: Low     GOALS: Goals reviewed with patient? Yes   SHORT TERM GOALS: Target date: 05/15/2022 Improve right knee AROM for extension to -5 and flexion to 90 degrees Baseline: 13-0-68 degrees Goal status: Met 05/01/2022   2.  Ricky Barton will be independent with his day 1 home exercise program Baseline: Started 04/17/2022 Goal status: Met 04/28/2022      LONG TERM GOALS: Target date: 06/12/2022  Improve FOTO to 62 Baseline: 47 Goal status: On Going 05/08/2022   2.  Ricky Barton will report right knee pain consistently 0-3 out of 10 on the visual analog scale Baseline: 3-7 out of 10 Goal status: On Going 05/27/2022   3.  Improve right knee AROM to 2-0-100 Baseline: 13-0-68 Goal status: Met 05/27/2022   4.  Improve right quadriceps strength to at least 60% of the uninvolved left Baseline: 22% Goal status: On Going 05/27/2022   5.  Ricky Barton will be able to walk 500 feet without a cane and manage 5 steps with subjective reports of no problem Baseline: Using a cane and has to use the handrail with 2 steps at home Goal status: Partially Met 05/27/2022   6.  Ricky Barton will be independent with his long-term home exercise program at discharge Baseline: Started 04/17/2022 Goal status: On Going 05/27/2022     PLAN:   PT FREQUENCY:  3 times a week for 2 weeks then switch to twice a week   PT DURATION: 8 weeks   PLANNED INTERVENTIONS: Therapeutic exercises, Therapeutic activity, Neuromuscular re-education, Balance training, Gait training, Patient/Family education, Self Care, Joint mobilization, Stair training, Cryotherapy, Vasopneumatic device, and Manual therapy   PLAN FOR NEXT SESSION:  Look at progress towards LTGs, FOTO and possible DC.   Farley Ly, PT, MPT 05/27/2022, 2:41 PM

## 2022-06-03 ENCOUNTER — Encounter: Payer: Self-pay | Admitting: Rehabilitative and Restorative Service Providers"

## 2022-06-03 ENCOUNTER — Ambulatory Visit: Payer: PPO | Admitting: Rehabilitative and Restorative Service Providers"

## 2022-06-03 DIAGNOSIS — M25661 Stiffness of right knee, not elsewhere classified: Secondary | ICD-10-CM

## 2022-06-03 DIAGNOSIS — R600 Localized edema: Secondary | ICD-10-CM | POA: Diagnosis not present

## 2022-06-03 DIAGNOSIS — M6281 Muscle weakness (generalized): Secondary | ICD-10-CM

## 2022-06-03 DIAGNOSIS — M25561 Pain in right knee: Secondary | ICD-10-CM | POA: Diagnosis not present

## 2022-06-03 DIAGNOSIS — R262 Difficulty in walking, not elsewhere classified: Secondary | ICD-10-CM | POA: Diagnosis not present

## 2022-06-03 NOTE — Therapy (Signed)
OUTPATIENT PHYSICAL THERAPY TREATMENT/DISCHARGE NOTE  Patient Name: Ricky Barton MRN: [id number] DOB:1955-03-27, 67 y.o., male Today's Date: 06/03/2022  PCP: Ria Bush, MD  REFERRING PROVIDER: Leandrew Koyanagi, MD  END OF SESSION:   PT End of Session - 06/03/22 1354     Visit Number 16    Number of Visits 16    Date for PT Re-Evaluation 06/12/22    Authorization Type Ortho bundle    Authorization - Visit Number 16    Authorization - Number of Visits 16    Progress Note Due on Visit 16    PT Start Time [id number]    PT Stop Time 1432    PT Time Calculation (min) 47 min    Activity Tolerance Patient tolerated treatment well;Patient limited by pain;No increased pain    Behavior During Therapy WFL for tasks assessed/performed            PHYSICAL THERAPY DISCHARGE SUMMARY  Visits from Start of Care: 16  Current functional level related to goals / functional outcomes: See note   Remaining deficits: See note   Education / Equipment: Updated HEP   Patient agrees to discharge. Patient goals were met. Patient is being discharged due to being pleased with the current functional level.   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   COLONOSCOPY  07/2016   WNL, rpt 5 yrs Fuller Plan)   COLONOSCOPY  11/2021   2 HPs, rpt 7 yrs Fuller Plan)   Head MRI  08/2000   POLYPECTOMY  2013   TA"s x 3   TOTAL KNEE ARTHROPLASTY Left 05/12/2021   Procedure: LEFT TOTAL KNEE ARTHROPLASTY;  Surgeon: Leandrew Koyanagi, MD;  Location: [facility];  Service: Orthopedics;   Laterality: Left;   TOTAL KNEE ARTHROPLASTY Right 04/03/2022   Procedure: RIGHT TOTAL KNEE ARTHROPLASTY;  Surgeon: Leandrew Koyanagi, MD;  Location: [facility];  Service: Orthopedics;  Laterality: Right;   Patient Active Problem List   Diagnosis Date Noted   Status post total right knee replacement 04/03/2022   Primary osteoarthritis of right knee 02/04/2022   Primary osteoarthritis of left knee 05/12/2021   Status post total left knee replacement 05/12/2021   Allergic rhinitis 12/09/2020   Low serum vitamin B12 12/07/2020   Welcome to Medicare preventive visit 12/06/2020   Advanced directives, counseling/discussion 12/06/2020   Angular cheilitis 12/06/2020   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   Elevated blood pressure reading without diagnosis of hypertension 11/10/2018   Renal lesion 03/28/2018   Arthralgia of hands, bilateral 09/12/2015   OSA on CPAP 09/12/2015   Obesity, Class I, BMI 30-34.9 04/05/2014   Hyperglycemia 03/30/2013   Healthcare maintenance 03/04/2012   HLD (hyperlipidemia) 01/18/2009   ERECTILE DYSFUNCTION, 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   History of seizure 08/15/1991    REFERRING DIAG: M17.11 (ICD-10-CM) - Primary osteoarthritis of right knee   ONSET DATE: R knee TKA 04/03/2022  THERAPY DIAG:  Muscle weakness (generalized)  Localized edema  Difficulty in walking, not elsewhere classified  Stiffness of right knee, not elsewhere classified  Right knee pain, unspecified chronicity  Rationale for Evaluation and Treatment Rehabilitation  PERTINENT HISTORY: Bil knee OA, Lt ear hearing loss, h/o seizures, Lt TKA 05/12/2021, HLD   PRECAUTIONS: None  SUBJECTIVE:                                                                                                                                                                                      SUBJECTIVE STATEMENT: Ricky Barton has returned to the gym and has not used an assistive device in several weeks.  He has demonstrated independence  with his home exercise program.  He still needs the pain meds to sleep.  PAIN:  Are you having pain? Yes: NPRS scale: 4-6/10 this week Pain location: Right knee Pain description: Ache, tight and can be sharp Aggravating factors: Prolonged postures, at night and too much weightbearing Relieving factors: Ice, pain medication and exercise   OBJECTIVE: (objective measures completed at initial evaluation unless otherwise dated)  OBJECTIVE:    DIAGNOSTIC FINDINGS: IMPRESSION: 1. Right total knee arthroplasty without evidence of acute postoperative complication.   PATIENT SURVEYS:  06/03/2022: FOTO 63 (Goal met)  05/08/2022: FOTO 52 (Goal 62)  EVAL: FOTO 47 (Goal 62 in 12 visits)   EDEMA:  Noted and not objectively assessed   LOWER EXTREMITY ROM:   ROM P:passive  A:active Right 04/17/22 Left 04/17/22 Right 04/20/22 Right 04/23/22 Right 04/28/22 Right 05/01/22 Right 05/04/22 Right 05/08/22 AROM Right 05/11/22 Left/Right in degrees 05/13/2022 Right 05/22/2022 Right 05/25/22 Right 05/27/22 Right 06/03/22  Knee flexion A: 68 A; 106 Seated P: 85* A: 80* Supine AA: 93 Active 95 Active 94 Seated A: 94* P: 97* Supine 98 Supine A: 91* P: 97* 109/100 With belt 105 Seated A: 98* P: 106* Active 101 Active 103  Knee extension A: -13 A: -2 Seated P: -5* A: quad set -7*  Active -5 Active -3 Seated A: LAQ -12* Standing A: -3* Supine -3   Supine Quad set A: -4* P: -2* 0/-3 -3 (between 2 and 3)  Active -2 Active -2   (Blank rows = not tested)   LOWER EXTREMITY STRENGTH:   MMT Right 04/17/2022 Left 04/17/2022 Right 05/08/2022 Right 06/03/2022  Knee flexion        Knee extension 19.3 pounds 87.2 pounds 37.2 pounds 56.1 pounds   (Blank rows = not tested)   GAIT: Distance walked: Within the clinic Assistive device utilized: Single point cane Level of assistance: Complete Independence Comments: Ricky Barton would like to return to walking without the cane     TODAY'S TREATMENT:  DATE:  06/03/2022 Recumbent bike Seat 7 for 8 minutes AAROM to full range 10X backwards and forwards Seated knee flexion (left pushes right into flexion) 10 for 10 seconds, PT overpressure on all 10 Prone knee extension stretch 4 minutes with 1.5# Quadriceps sets with right heel prop 10X 5 seconds Hip hike at countertop 10X 3 seconds alternating Progress note and updating of home exercise program   05/27/2022 Recumbent bike Seat 7 for 8 minutes AAROM to full range 10X backwards and 5X forwards Seated knee flexion (left pushes right into flexion) 2 sets of 10 for 10 seconds, PT overpressure on last 10 Prone knee extension stretch 4 minutes with 1.5# Quadriceps sets with right heel prop 10X 5 seconds Hip hike at countertop 2 sets of 10 3 seconds alternating  Functional Activities (for stairs, sit to stand): Double Leg Press 150# 15X slow eccentrics full range Single Leg Press   Vaso right knee Medium 34* 10 minutes with elevation   05/25/2022: Therapeutic Exercise: Precor recumbent bike seat 6 rocking for flexion stretch 3-5 sec for 8 min. Standing knee flexion stretch with RLE at edge of chair forward lean & hamstring stretch 10 sec hold ea moving LLE stance limb closer for 15 reps total.  Pt verbalizes as option for HEP. Squat at sink over chair with PT demo & verbal cues technique.  Pt performed 20 reps.   Therapeutic Activities: Stairs descend step-to flexing Rt knee 5 steps 2 rails, 3 steps contralateral rail & 3 steps ipsilateral rail.  Pt ascend alternating pattern with left rail.  PT demo & verbal cues on technique.  Leg press BLEs 100# 15 reps with focus on full knee ext & max flexion 5 sec hold ea;  RLE only 50# 15 reps.  Manual therapy: PROM ext stretch during rest on leg press.  Flexion PROM with overpressure seated. Hypervolt to quads during stretch.   Vaso right knee Medium 34* 10 minutes with elevation   PATIENT  EDUCATION:  Education details: Reviewed exam findings and home exercises Person educated: Patient and Spouse Education method: Explanation, Demonstration, Tactile cues, Verbal cues, and Handouts Education comprehension: verbalized understanding, returned demonstration, verbal cues required, tactile cues required, and needs further education   HOME EXERCISE PROGRAM: Access Code: [email or web address] URL: [email or web address] Date: 06/03/2022 Prepared by: Vista Mink  Exercises - Supine Quadricep Sets  - 5 x daily - 7 x weekly - 2 sets - 10 reps - 5 second hold - Seated Knee Flexion AAROM  - 3-5 x daily - 7 x weekly - 1 sets - 1 reps - 3 minutes hold - Prone Knee Extension with Ankle Weight  - 1 x daily - 7 x weekly - 3-5 minutes hold - Gastroc Stretch on Step  - 2 x daily - 7 x weekly - 1 sets - 2-3 reps - 30 seconds hold - Standing Quad Stretch with Strap  - 2 x daily - 7 x weekly - 1 sets - 2-3 reps - 30 seconds hold - Seated Table Hamstring Stretch  - 2 x daily - 7 x weekly - 1 sets - 2-3 reps - 30 seconds hold - Standing Hip Hiking  - 3-5 x daily - 7 x weekly - 1 sets - 10 reps - 3 seconds hold   ASSESSMENT:  CLINICAL IMPRESSION: Ricky Barton has demonstrated independence and compliance with his home exercise program.  He has returned to the gym and has not used his cane in several weeks.  He still needs his prescription  pain pills at night, but AROM was 2-0-103 today.  After warm-up, he is able to get a complete cycle on the bike.  Because Ricky Barton expresses comfort with his HEP and because he has return to the gym, Ricky Barton appears ready for transfer into independent rehabilitation.  With consistent home exercise program compliance, I expect his right knee flexion should wind up around 110 degrees 3 to 4 months post-surgery.  He is welcome to return for further rehabilitation if requested.  OBJECTIVE IMPAIRMENTS: Abnormal gait, decreased activity tolerance, decreased balance, decreased  endurance, and decreased knowledge of condition.    ACTIVITY LIMITATIONS: carrying, bending, sitting, standing, squatting, sleeping, stairs, and locomotion level   PARTICIPATION LIMITATIONS: driving, community activity, and yard work   PERSONAL FACTORS: Bil knee OA, Lt ear hearing loss, h/o seizures, Lt TKA 05/12/2021, HLD are also affecting patient's functional outcome.    REHAB POTENTIAL: Good   CLINICAL DECISION MAKING: Stable/uncomplicated   EVALUATION COMPLEXITY: Low     GOALS: Goals reviewed with patient? Yes   SHORT TERM GOALS: Target date: 05/15/2022 Improve right knee AROM for extension to -5 and flexion to 90 degrees Baseline: 13-0-68 degrees Goal status: Met 05/01/2022   2.  Ricky Barton will be independent with his day 1 home exercise program Baseline: Started 04/17/2022 Goal status: Met 04/28/2022     LONG TERM GOALS: Target date: 06/12/2022   Improve FOTO to 62 Baseline: 47 Goal status: Met 06/03/2022   2.  Ricky Barton will report right knee pain consistently 0-3 out of 10 on the visual analog scale Baseline: 3-7 out of 10 Goal status: On Going 06/03/2022   3.  Improve right knee AROM to 2-0-100 Baseline: 13-0-68 Goal status: Met 05/27/2022   4.  Improve right quadriceps strength to at least 60% of the uninvolved left Baseline: 22% Goal status: Met 06/03/2022   5.  Ricky Barton will be able to walk 500 feet without a cane and manage 5 steps with subjective reports of no problem Baseline: Using a cane and has to use the handrail with 2 steps at home Goal status: Met 06/03/2022   6.  Ricky Barton will be independent with his long-term home exercise program at discharge Baseline: Started 04/17/2022 Goal status: Met 06/03/2022     PLAN:   PT FREQUENCY:  DC   PT DURATION: DC   PLANNED INTERVENTIONS: Therapeutic exercises, Therapeutic activity, Neuromuscular re-education, Balance training, Gait training, Patient/Family education, Self Care, Joint mobilization, Stair training,  Cryotherapy, Vasopneumatic device, and Manual therapy   PLAN FOR NEXT SESSION:  DC   Farley Ly, PT, MPT 06/03/2022, 5:17 PM

## 2022-06-05 ENCOUNTER — Encounter: Payer: PPO | Admitting: Rehabilitative and Restorative Service Providers"

## 2022-06-26 ENCOUNTER — Ambulatory Visit (INDEPENDENT_AMBULATORY_CARE_PROVIDER_SITE_OTHER): Payer: PPO | Admitting: Physician Assistant

## 2022-06-26 DIAGNOSIS — Z96651 Presence of right artificial knee joint: Secondary | ICD-10-CM

## 2022-06-26 NOTE — Progress Notes (Signed)
Post-Op Visit Note   Patient: Ricky Barton           Date of Birth: [birth year]           MRN: [id number] Visit Date: 06/26/2022 PCP: Eustaquio Boyden, MD   Assessment & Plan:  Chief Complaint:  Chief Complaint  Patient presents with   Right Knee - Follow-up   Visit Diagnoses:  1. Status post total right knee replacement     Plan: Patient is a pleasant 67 year old gentleman who comes in today 3 months status post right total knee replacement 04/03/2022.  Has been doing well in regards to pain but notes stiffness at times which radiates down his shin.  He feels as though his range of motion has improved and he was discharged from physical therapy a few weeks back.  He continues to work on a home exercise program at home.  Examination of his right knee reveals range of motion from 0 to 95 degrees.  He is stable to valgus and varus stress.  He is neurovascular intact distally.  Calves are soft nontender.  At this point, I would like to refer him back to physical therapy to continue working on range of motion.  He is agreeable to this plan.  He will follow-up in 3 months for repeat evaluation and 2 view x-rays of the right knee.  Dental prophylaxis reinforced.  Call with concerns or questions.  Follow-Up Instructions: Return in about 3 months (around 09/25/2022).   Orders:  Orders Placed This Encounter  Procedures   Ambulatory referral to Physical Therapy   No orders of the defined types were placed in this encounter.   Imaging: No new imaging  PMFS History: Patient Active Problem List   Diagnosis Date Noted   Status post total right knee replacement 04/03/2022   Primary osteoarthritis of right knee 02/04/2022   Primary osteoarthritis of left knee 05/12/2021   Status post total left knee replacement 05/12/2021   Allergic rhinitis 12/09/2020   Low serum vitamin B12 12/07/2020   Welcome to Medicare preventive visit 12/06/2020   Advanced directives, counseling/discussion  12/06/2020   Angular cheilitis 12/06/2020   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   Elevated blood pressure reading without diagnosis of hypertension 11/10/2018   Renal lesion 03/28/2018   Arthralgia of hands, bilateral 09/12/2015   OSA on CPAP 09/12/2015   Obesity, Class I, BMI 30-34.9 04/05/2014   Hyperglycemia 03/30/2013   Healthcare maintenance 03/04/2012   HLD (hyperlipidemia) 01/18/2009   ERECTILE DYSFUNCTION, 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   History of seizure 08/15/1991   Past Medical History:  Diagnosis Date   Arthritis    bilateral knees   Hypoglycemic syndrome    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    OSA on CPAP 09/12/2015   Severe by HST   Seizures ([facility]) 1990s   isolated, none since, was on dilantin for 3 yrs, then stopped   Sleep apnea    uses CPAP nightly    Family History  Problem Relation Age of Onset   Stomach cancer Mother    Hypertension Mother    Colon polyps Mother    Cancer Father 60       stomach cancer   Hyperlipidemia Brother    CAD Brother 49       MI (smoker)   Hyperlipidemia Brother    Sleep apnea Brother    Pancreatic cancer Brother 66   Hyperlipidemia Brother    CAD Brother 38  MI (smoker)   Hyperlipidemia Brother    Multiple sclerosis Brother    Colon cancer Neg Hx    Esophageal cancer Neg Hx    Rectal cancer Neg Hx     Past Surgical History:  Procedure Laterality Date   CHOLECYSTECTOMY  1989   COLONOSCOPY  03/2011   3 adenomatous polyps, rec rpt 5 yrs (Dr Russella Dar).   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   Procedure: LEFT TOTAL KNEE ARTHROPLASTY;  Surgeon: Tarry Kos, MD;  Location: [facility];  Service: Orthopedics;  Laterality: Left;   TOTAL KNEE ARTHROPLASTY Right 04/03/2022   Procedure: RIGHT TOTAL KNEE ARTHROPLASTY;  Surgeon: Tarry Kos, MD;   Location: [facility];  Service: Orthopedics;  Laterality: Right;   Social History   Occupational History   Occupation: Barista: [employer]    Comment: [employer]  Tobacco Use   Smoking status: Never   Smokeless tobacco: Never  Vaping Use   Vaping Use: Never used  Substance and Sexual Activity   Alcohol use: No   Drug use: No   Sexual activity: Yes

## 2022-07-05 ENCOUNTER — Emergency Department (HOSPITAL_BASED_OUTPATIENT_CLINIC_OR_DEPARTMENT_OTHER): Payer: PPO

## 2022-07-05 ENCOUNTER — Emergency Department (HOSPITAL_BASED_OUTPATIENT_CLINIC_OR_DEPARTMENT_OTHER)
Admission: EM | Admit: 2022-07-05 | Discharge: 2022-07-05 | Disposition: A | Discharge status: Home / Self Care | Payer: PPO | Attending: Emergency Medicine | Admitting: Emergency Medicine

## 2022-07-05 ENCOUNTER — Other Ambulatory Visit: Payer: Self-pay

## 2022-07-05 DIAGNOSIS — R000 Tachycardia, unspecified: Secondary | ICD-10-CM | POA: Diagnosis not present

## 2022-07-05 DIAGNOSIS — N200 Calculus of kidney: Secondary | ICD-10-CM | POA: Diagnosis not present

## 2022-07-05 DIAGNOSIS — N3289 Other specified disorders of bladder: Secondary | ICD-10-CM | POA: Diagnosis not present

## 2022-07-05 DIAGNOSIS — Z7982 Long term (current) use of aspirin: Secondary | ICD-10-CM | POA: Diagnosis not present

## 2022-07-05 DIAGNOSIS — R1031 Right lower quadrant pain: Secondary | ICD-10-CM | POA: Diagnosis present

## 2022-07-05 DIAGNOSIS — N202 Calculus of kidney with calculus of ureter: Secondary | ICD-10-CM | POA: Diagnosis not present

## 2022-07-05 DIAGNOSIS — N281 Cyst of kidney, acquired: Secondary | ICD-10-CM | POA: Diagnosis not present

## 2022-07-05 LAB — URINALYSIS, ROUTINE W REFLEX MICROSCOPIC
Bilirubin Urine: NEGATIVE
Glucose, UA: NEGATIVE mg/dL
Hgb urine dipstick: NEGATIVE
Ketones, ur: NEGATIVE mg/dL
Leukocytes,Ua: NEGATIVE
Nitrite: NEGATIVE
Protein, ur: NEGATIVE mg/dL
Specific Gravity, Urine: 1.018 (ref 1.005–1.030)
pH: 6 (ref 5.0–8.0)

## 2022-07-05 LAB — CBC
HCT: 42.4 % (ref 39.0–52.0)
Hemoglobin: 14.2 g/dL (ref 13.0–17.0)
MCH: 29.3 pg (ref 26.0–34.0)
MCHC: 33.5 g/dL (ref 30.0–36.0)
MCV: 87.4 fL (ref 80.0–100.0)
Platelets: 307 10*3/uL (ref 150–400)
RBC: 4.85 MIL/uL (ref 4.22–5.81)
RDW: 13.2 % (ref 11.5–15.5)
WBC: 17.7 10*3/uL — ABNORMAL HIGH (ref 4.0–10.5)
nRBC: 0 % (ref 0.0–0.2)

## 2022-07-05 LAB — COMPREHENSIVE METABOLIC PANEL
ALT: 12 U/L (ref 0–44)
AST: 21 U/L (ref 15–41)
Albumin: 4.3 g/dL (ref 3.5–5.0)
Alkaline Phosphatase: 78 U/L (ref 38–126)
Anion gap: 11 (ref 5–15)
BUN: 16 mg/dL (ref 8–23)
CO2: 21 mmol/L — ABNORMAL LOW (ref 22–32)
Calcium: 9.2 mg/dL (ref 8.9–10.3)
Chloride: 106 mmol/L (ref 98–111)
Creatinine, Ser: 1.01 mg/dL (ref 0.61–1.24)
GFR, Estimated: >60 mL/min (ref >60)
Glucose, Bld: 126 mg/dL — ABNORMAL HIGH (ref 70–99)
Potassium: 3.8 mmol/L (ref 3.5–5.1)
Sodium: 138 mmol/L (ref 135–145)
Total Bilirubin: 0.5 mg/dL (ref 0.3–1.2)
Total Protein: 7.7 g/dL (ref 6.5–8.1)

## 2022-07-05 LAB — DIFFERENTIAL
Abs Immature Granulocytes: 0.11 10*3/uL — ABNORMAL HIGH (ref 0.00–0.07)
Basophils Absolute: 0 10*3/uL (ref 0.0–0.1)
Basophils Relative: 0 %
Eosinophils Absolute: 0 10*3/uL (ref 0.0–0.5)
Eosinophils Relative: 0 %
Immature Granulocytes: 1 %
Lymphocytes Relative: 10 %
Lymphs Abs: 1.7 10*3/uL (ref 0.7–4.0)
Monocytes Absolute: 1 10*3/uL (ref 0.1–1.0)
Monocytes Relative: 6 %
Neutro Abs: 14.7 10*3/uL — ABNORMAL HIGH (ref 1.7–7.7)
Neutrophils Relative %: 83 %

## 2022-07-05 LAB — LIPASE, BLOOD: Lipase: 25 U/L (ref 11–51)

## 2022-07-05 MED ORDER — MORPHINE SULFATE (PF) 4 MG/ML IV SOLN
4.0000 mg | Freq: Once | INTRAVENOUS | Status: AC
  Administered 2022-07-05: 4 mg via INTRAVENOUS
  Filled 2022-07-05: qty 1

## 2022-07-05 MED ORDER — ONDANSETRON HCL 4 MG/2ML IJ SOLN
4.0000 mg | Freq: Once | INTRAMUSCULAR | Status: AC
  Administered 2022-07-05: 4 mg via INTRAVENOUS
  Filled 2022-07-05: qty 2

## 2022-07-05 MED ORDER — OXYCODONE HCL 5 MG PO TABS: 5.0000 mg | ORAL_TABLET | Freq: Four times a day (QID) | ORAL | 0 refills | Status: DC | PRN

## 2022-07-05 MED ORDER — ONDANSETRON HCL 4 MG PO TABS: 4.0000 mg | ORAL_TABLET | Freq: Four times a day (QID) | ORAL | 0 refills | Status: DC

## 2022-07-05 MED ORDER — TAMSULOSIN HCL 0.4 MG PO CAPS: 0.4000 mg | ORAL_CAPSULE | Freq: Every day | ORAL | 0 refills | Status: DC

## 2022-07-05 MED ORDER — IOHEXOL 300 MG/ML  SOLN
100.0000 mL | Freq: Once | INTRAMUSCULAR | Status: AC | PRN
  Administered 2022-07-05: 100 mL via INTRAVENOUS

## 2022-07-05 MED ORDER — OXYCODONE-ACETAMINOPHEN 5-325 MG PO TABS
1.0000 | ORAL_TABLET | Freq: Once | ORAL | Status: DC
  Filled 2022-07-05: qty 1

## 2022-07-05 NOTE — ED Triage Notes (Signed)
Patient arrives with complaints of worsening RLQ abdominal pain that started this morning. No vomiting/diarrhea. He does report some nausea. Rates pain a 10/10.

## 2022-07-05 NOTE — ED Notes (Signed)
Dc instructions reviewed with patient. Patient voiced understanding. Dc with belongings.  °

## 2022-07-05 NOTE — Discharge Instructions (Signed)
You were seen in the emergency department for your abdominal pain.  You do have a kidney stone that is 1 mm.  You are likely able to pass this on your own.  You can take Tylenol and Motrin as needed for pain and both can be taken every 6 hours.  I have also given you oxycodone to take for breakthrough pain.  Do not take this before driving, operating heavy machinery or working.  It may make you constipated and you can take a stool softener with it.  You can take Zofran as needed for nausea and you should take Flomax to help you pass the stone.  You can follow-up with your primary doctor and urology to have your symptoms rechecked.  You should return to the emergency department for significantly worsening pain, repetitive vomiting, fevers or if you have any other new or concerning symptoms.

## 2022-07-05 NOTE — ED Provider Notes (Signed)
[place] EMERGENCY DEPARTMENT AT [facility] Provider Note   CSN: [id number] Arrival date & time: 07/05/22  4132     History  Chief Complaint  Patient presents with   Abdominal Pain    Ricky Barton is a 67 y.o. male.  Patient is a 67 year old male with a past medical history of cholecystectomy presenting to the emergency department with right lower quadrant pain.  The patient states that he woke up around 3 AM and felt like he needed to go to the bathroom.  He states that he felt the pain in his right lower quadrant and right flank.  He states that he had no improvement with using the bathroom and tried to lay down but the pain persisted.  He states that he has been unable to find a comfortable position.  He states that he has been dry heaving and nauseous.  He denies any diarrhea or constipation, dysuria or hematuria, fevers or chills.  He denies any prior history of kidney stones.  The history is provided by the patient and the spouse.  Abdominal Pain      Home Medications Prior to Admission medications   Medication Sig Start Date End Date Taking? Authorizing Provider  ondansetron (ZOFRAN) 4 MG tablet Take 1 tablet (4 mg total) by mouth every 6 (six) hours. 07/05/22  Yes Theresia Lo, Benetta Spar K, DO  oxyCODONE (ROXICODONE) 5 MG immediate release tablet Take 1 tablet (5 mg total) by mouth every 6 (six) hours as needed for severe pain. 07/05/22  Yes Theresia Lo, Benetta Spar K, DO  tamsulosin (FLOMAX) 0.4 MG CAPS capsule Take 1 capsule (0.4 mg total) by mouth daily. 07/05/22  Yes Theresia Lo, Croy Drumwright K, DO  amoxicillin (AMOXIL) 500 MG capsule Take four pills one hour prior to dental work 03/27/22   Cristie Hem, PA-C  aspirin EC 81 MG tablet Take 1 tablet (81 mg total) by mouth 2 (two) times daily. To be taken after surgery to prevent blood clots Patient taking differently: Take 81 mg by mouth every Monday, Wednesday, and Friday. 05/05/21   Cristie Hem, PA-C  aspirin EC 81 MG  tablet Take 1 tablet (81 mg total) by mouth 2 (two) times daily. To be taken after surgery to prevent blood clots 03/27/22 03/27/23  Cristie Hem, PA-C  cefadroxil (DURICEF) 500 MG capsule Take 1 capsule (500 mg total) by mouth 2 (two) times daily. To be taken after surgery 03/27/22   Cristie Hem, PA-C  cetirizine (ZYRTEC) 10 MG tablet TAKE 1 TABLET BY MOUTH EVERY DAY Patient taking differently: Take 10 mg by mouth daily as needed for allergies. 07/11/20   Erin Fulling, MD  cyanocobalamin 1000 MCG tablet Take 1 tablet (1,000 mcg total) by mouth every Monday, Wednesday, and Friday. Vit b12 02/11/22   Eustaquio Boyden, MD  docusate sodium (COLACE) 100 MG capsule Take 1 capsule (100 mg total) by mouth daily as needed. Patient taking differently: Take 100 mg by mouth daily as needed. To be taken after surgery 03/27/22 03/27/23  Cristie Hem, PA-C  Glucosamine-Chondroitin (OSTEO BI-FLEX REGULAR STRENGTH PO) Take 2 tablets by mouth daily.    [provider]  ibuprofen (ADVIL) 200 MG tablet Take 400 mg by mouth every 6 (six) hours as needed for moderate pain.    [provider]  methocarbamol (ROBAXIN-750) 750 MG tablet Take 1 tablet (750 mg total) by mouth 2 (two) times daily as needed for muscle spasms. 05/08/22   Tarry Kos, MD  Omega-3  Fatty Acids (FISH OIL) 1000 MG CAPS Take 1 capsule (1,000 mg total) by mouth daily. 02/11/22   Eustaquio Boyden, MD  pravastatin (PRAVACHOL) 20 MG tablet Take 1 tablet (20 mg total) by mouth daily. 02/11/22   Eustaquio Boyden, MD  sildenafil (VIAGRA) 100 MG tablet TAKE 1/2 TO 1 TABLET BY MOUTH 1 HOUR PRIOR AS NEEDED 04/05/14   Eustaquio Boyden, MD  sodium chloride (OCEAN) 0.65 % SOLN nasal spray Place 1 spray into both nostrils at bedtime.    [provider]      Allergies    Piroxicam    Review of Systems   Review of Systems  Gastrointestinal:  Positive for abdominal pain.    Physical Exam Updated Vital Signs BP (!) 157/93  (BP Location: Right Arm)   Pulse 93   Temp 98.2 F (36.8 C) (Oral)   Resp 18   SpO2 97%  Physical Exam Vitals and nursing note reviewed.  Constitutional:      Appearance: He is well-developed.     Comments: Uncomfortable appearing  HENT:     Head: Normocephalic and atraumatic.     Mouth/Throat:     Mouth: Mucous membranes are moist.     Pharynx: Oropharynx is clear.  Eyes:     Extraocular Movements: Extraocular movements intact.  Cardiovascular:     Rate and Rhythm: Regular rhythm. Tachycardia present.     Heart sounds: Normal heart sounds.  Pulmonary:     Effort: Pulmonary effort is normal.     Breath sounds: Normal breath sounds.  Abdominal:     General: Abdomen is flat.     Palpations: Abdomen is soft.     Tenderness: There is abdominal tenderness in the right lower quadrant. There is no right CVA tenderness, left CVA tenderness, guarding or rebound.  Skin:    General: Skin is warm and dry.  Neurological:     General: No focal deficit present.     Mental Status: He is alert and oriented to person, place, and time.  Psychiatric:        Mood and Affect: Mood normal.        Behavior: Behavior normal.     ED Results / Procedures / Treatments   Labs (all labs ordered are listed, but only abnormal results are displayed) Labs Reviewed  COMPREHENSIVE METABOLIC PANEL - Abnormal; Notable for the following components:      Result Value   CO2 21 (*)    Glucose, Bld 126 (*)    All other components within normal limits  CBC - Abnormal; Notable for the following components:   WBC 17.7 (*)    All other components within normal limits  URINALYSIS, ROUTINE W REFLEX MICROSCOPIC - Abnormal; Notable for the following components:   Color, Urine COLORLESS (*)    All other components within normal limits  LIPASE, BLOOD  DIFFERENTIAL    EKG None  Radiology CT ABDOMEN PELVIS W CONTRAST  Result Date: 07/05/2022 CLINICAL DATA:  Abdominal/flank pain, stone suspected RLQ  abdominal pain EXAM: CT ABDOMEN AND PELVIS WITH CONTRAST TECHNIQUE: Multidetector CT imaging of the abdomen and pelvis was performed using the standard protocol following bolus administration of intravenous contrast. RADIATION DOSE REDUCTION: This exam was performed according to the departmental dose-optimization program which includes automated exposure control, adjustment of the mA and/or kV according to patient size and/or use of iterative reconstruction technique. CONTRAST:  OMNIPAQUE IOHEXOL 300 MG/ML  SOLN COMPARISON:  03/28/2018 FINDINGS: Lower chest: No pleural or pericardial  effusion. 5 mm right middle lobe nodule (Im2,Se4) . visualized lung bases otherwise clear. Hepatobiliary: No focal liver abnormality is seen. Status post cholecystectomy. No biliary dilatation. Pancreas: Unremarkable. No pancreatic ductal dilatation or surrounding inflammatory changes. Spleen: Normal in size without focal abnormality. Adrenals/Urinary Tract: No adrenal mass. 1 mm calculus in the mid left renal collecting system image 33. 1 mm calculus in the distal right ureter near the ureteral orifice. There is right pelvicaliectasis. Moderate non loculated right perinephric retroperitoneal fluid possibly related to caliceal rupture. Decreased right renal parenchymal enhancement compared to the left. 1.7 cm 6 HU exophytic cyst from the left upper pole as before; no follow-up warranted. Urinary bladder is incompletely distended. Stomach/Bowel: Stomach is partially distended, unremarkable. Small bowel is decompressed. Normal appendix. The colon is incompletely distended by gas and fecal material with scattered distal descending diverticula; no adjacent inflammatory change. Vascular/Lymphatic: No significant vascular findings are present. No enlarged abdominal or pelvic lymph nodes. Reproductive: Mild prostate enlargement with coarse calcifications. Other: No ascites.  No free air. Musculoskeletal: Mild multilevel lumbar  spondylitic change. No fracture or worrisome bone lesion. IMPRESSION: 1. Obstructing 1 mm distal right ureteral calculus. 2. Left nephrolithiasis. 3. Descending colonic diverticula. 4. 5 mm right middle lobe pulmonary nodule.Per Fleischner Society Guidelines, no routine follow-up imaging is recommended. These guidelines do not apply to immunocompromised patients and patients with cancer. Follow up in patients with significant comorbidities as clinically warranted. For lung cancer screening, adhere to Lung-RADS guidelines. Reference: Radiology. 2017; 284(1):228-43. Electronically Signed   By: Corlis Leak M.D.   On: 07/05/2022 09:53    Procedures Procedures    Medications Ordered in ED Medications  oxyCODONE-acetaminophen (PERCOCET/ROXICET) 5-325 MG per tablet 1 tablet (has no administration in time range)  morphine (PF) 4 MG/ML injection 4 mg (4 mg Intravenous Given 07/05/22 0909)  ondansetron (ZOFRAN) injection 4 mg (4 mg Intravenous Given 07/05/22 0909)  iohexol (OMNIPAQUE) 300 MG/ML solution 100 mL (100 mLs Intravenous Contrast Given 07/05/22 0930)    ED Course/ Medical Decision Making/ A&P Clinical Course as of 07/05/22 1022  Sun Jul 05, 2022  0957 CT with 1 mm R-sided kidney stone, UA negative for UTI.  [VK]    Clinical Course User Index [VK] Rexford Maus, DO                             Medical Decision Making This patient presents to the ED with chief complaint(s) of right lower quadrant pain with pertinent past medical history of prior cholecystectomy which further complicates the presenting complaint. The complaint involves an extensive differential diagnosis and also carries with it a high risk of complications and morbidity.    The differential diagnosis includes appendicitis, nephrolithiasis, pyelonephritis, no rash making shingles unlikely, other intra-abdominal infection, having normal bowel movements making SBO less likely  Additional history obtained: Additional  history obtained from spouse Records reviewed N/A  ED Course and Reassessment: On patient's arrival to the emergency department he is uncomfortable appearing though hemodynamically stable.  He had IV placed and labs initially ordered by triage.  The patient is writhing around in bed with right lower quadrant tenderness, concerning for appendicitis or nephrolithiasis and will have CT abdomen pelvis performed.  He was given Zofran and morphine for pain control and will be closely reassessed.  Independent labs interpretation:  The following labs were independently interpreted: leukocytosis, labs otherwise within normal range  Independent visualization of imaging: - I independently  visualized the following imaging with scope of interpretation limited to determining acute life threatening conditions related to emergency care: CTAP, which revealed 1mm obstructing R-sided stone  Consultation: - Consulted or discussed management/test interpretation w/ external professional: N/A  Consideration for admission or further workup: Patient has no emergent conditions requiring admission or further work-up at this time and is stable for discharge home with primary care and urology follow-up  Social Determinants of health: N/A    Amount and/or Complexity of Data Reviewed Labs: ordered. Radiology: ordered.  Risk Prescription drug management.          Final Clinical Impression(s) / ED Diagnoses Final diagnoses:  Nephrolithiasis    Rx / DC Orders ED Discharge Orders          Ordered    oxyCODONE (ROXICODONE) 5 MG immediate release tablet  Every 6 hours PRN        07/05/22 1020    ondansetron (ZOFRAN) 4 MG tablet  Every 6 hours        07/05/22 1020    tamsulosin (FLOMAX) 0.4 MG CAPS capsule  Daily        07/05/22 1020              [place], Ayden K, DO 07/05/22 1022

## 2022-07-06 ENCOUNTER — Encounter: Payer: Self-pay | Admitting: Physical Therapy

## 2022-07-06 ENCOUNTER — Ambulatory Visit: Payer: PPO | Admitting: Physical Therapy

## 2022-07-06 DIAGNOSIS — M25561 Pain in right knee: Secondary | ICD-10-CM | POA: Diagnosis not present

## 2022-07-06 DIAGNOSIS — M25661 Stiffness of right knee, not elsewhere classified: Secondary | ICD-10-CM

## 2022-07-06 NOTE — Therapy (Addendum)
OUTPATIENT PHYSICAL THERAPY LOWER EXTREMITY EVALUATION   Patient Name: Ricky Barton MRN: [id number] DOB:1955-10-16, 67 y.o., male Today's Date: 07/06/2022  END OF SESSION:  PT End of Session - 07/06/22 1510     Visit Number 1    Number of Visits 8    Date for PT Re-Evaluation 08/03/22    PT Start Time 1430    PT Stop Time 1514    PT Time Calculation (min) 44 min    Activity Tolerance Patient tolerated treatment well    Behavior During Therapy [facility] for tasks assessed/performed             Past Medical History:  Diagnosis Date   Arthritis    bilateral knees   Hypoglycemic syndrome    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   Procedure: LEFT TOTAL KNEE ARTHROPLASTY;  Surgeon: Tarry Kos, MD;  Location: [facility] OR;  Service: Orthopedics;  Laterality: Left;   TOTAL KNEE ARTHROPLASTY Right 04/03/2022   Procedure: RIGHT TOTAL KNEE ARTHROPLASTY;  Surgeon: Tarry Kos, MD;  Location: [facility] OR;  Service: Orthopedics;  Laterality: Right;   Patient Active Problem List   Diagnosis Date Noted   Status post total right knee replacement 04/03/2022   Primary osteoarthritis of right knee 02/04/2022   Primary osteoarthritis of left knee 05/12/2021   Status post total left knee replacement 05/12/2021   Allergic rhinitis 12/09/2020   Low serum vitamin B12 12/07/2020   Welcome to Medicare preventive visit 12/06/2020   Advanced directives,  counseling/discussion 12/06/2020   Angular cheilitis 12/06/2020   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   Elevated blood pressure reading without diagnosis of hypertension 11/10/2018   Renal lesion 03/28/2018   Arthralgia of hands, bilateral 09/12/2015   OSA on CPAP 09/12/2015   Obesity, Class I, BMI 30-34.9 04/05/2014   Hyperglycemia 03/30/2013   Healthcare maintenance 03/04/2012   HLD (hyperlipidemia) 01/18/2009   ERECTILE DYSFUNCTION, 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 He notes difficulty and stiffness with flexion more than extension, he feels like he get shin splints down his leg.  PERTINENT HISTORY: Bil TKA, Lt ear hearing loss, h/o seizures, PAIN:  Are you having pain? Yes: NPRS scale: 1-2/10 Pain location: Rt anterior knee and runs down  shin Pain description: tightness Aggravating factors: bending his knee, sitting for too long Relieving factors: goes away with movement  PRECAUTIONS: None  WEIGHT BEARING RESTRICTIONS: No  FALLS:  Has patient fallen in last 6 months? No   PLOF: Independent  PATIENT GOALS: reduce stiffness and pain in knee  NEXT MD VISIT:   OBJECTIVE:   DIAGNOSTIC FINDINGS: XR Rt knee 05/15/22 impression "Well-seated prosthesis with complication"  PATIENT SURVEYS:  Eval: FOTO 09/25/22  COGNITION: Overall cognitive status: Within functional limits for tasks assessed     SENSATION:   EDEMA:    MUSCLE  LENGTH: Tightness in quads and hamstring Rt leg  POSTURE:   PALPATION: some pain around patella tendon   LOWER EXTREMITY ROM:  Active ROM/PROM Right eval Left eval  Hip flexion    Hip extension    Hip abduction    Hip adduction    Hip internal rotation    Hip external rotation    Knee flexion  94/100  Knee extension  3/1  Ankle dorsiflexion    Ankle plantarflexion    Ankle inversion    Ankle eversion     (Blank rows = not tested)  LOWER EXTREMITY MMT:  MMT tested in sitting Right eval Left eval  Hip flexion 4+   Hip extension    Hip abduction 5   Hip adduction    Hip internal rotation    Hip external rotation    Knee flexion 5   Knee extension 5   Ankle dorsiflexion    Ankle plantarflexion    Ankle inversion    Ankle eversion     (Blank rows = not tested)  LOWER EXTREMITY SPECIAL TESTS:    FUNCTIONAL TESTS:   GAIT: Eval Comments: WFL but difficulty when first starts walking after sitting   TODAY'S TREATMENT:  Eval HEP creation and review with demonstration and trial set preformed, see below for details Sci fit bike L3 X 8 min seat #9 Manual therapy X 8 min for Rt knee PROM into flexion with overpressure and patella mobs    PATIENT EDUCATION: Education details: HEP, PT plan of care Person educated: Patient Education method: Explanation, Demonstration, Verbal cues, and Handouts Education comprehension: verbalized understanding and needs further education   HOME EXERCISE PROGRAM: Access Code: [email or web address] URL: [email or web address] Date: 07/06/2022 Prepared by: Ivery Quale  Exercises - Supine Quadriceps Stretch with Strap on Table (Mirrored)  - 3 x daily - 6 x weekly - 3 reps - 30 hold - Seated Knee Flexion AAROM  - 3 x daily - 7 x weekly - 1 sets - 1 reps - 3 minutes hold - Supine Heel Slide with Strap  - 3 x daily - 6 x weekly - 1-2 sets - 10 reps - 5 hold - standing knee flexion stretch on chair or step  - 2 x daily - 6 x  weekly - 1 sets - 10 reps - 10 hold - Seated Passive Knee Extension  - 2 x daily - 6 x weekly - 1 sets - 3 reps - 1-2 min hold  ASSESSMENT:  CLINICAL IMPRESSION: Patient referred to PT for Rt knee ROM S/P TKA. PT recommending 4 more weeks of PT to work to maximize his knee ROM to improve function and then the plan is to transition to independent program. Patient will benefit from skilled PT to address below impairments, limitations and improve overall function.  OBJECTIVE IMPAIRMENTS: decreased activity tolerance, difficulty walking, decreased endurance, decreased mobility, decreased ROM, decreased strength, impaired flexibility,  and pain.  ACTIVITY LIMITATIONS: bending, lifting, carry, locomotion, cleaning,  PERSONAL FACTORS: Bil TKA, Lt ear hearing loss, h/o seizures, are also affecting patient's functional outcome.  REHAB POTENTIAL: Good  CLINICAL DECISION MAKING: Stable/uncomplicated  EVALUATION COMPLEXITY: Low    GOALS:  Long term PT goals Target date:08/03/2022   Pt will improve Rt knee ROM 2-105 degrees to improve functional mobility Baseline: Goal status: New Pt will improve  seated hip/knee strength to at least 5/5 MMT to improve functional strength Baseline: Goal status: New Pt will improve FOTO to at least % functional to show improved function Baseline: Goal status: New Pt will be able to ambulate community distances at least 1000 ft WNL gait pattern without complaints Baseline: Goal status: New  PLAN: PT FREQUENCY: 2 times per week   PT DURATION: 4 weeks  PLANNED INTERVENTIONS (unless contraindicated): aquatic PT, Canalith repositioning, cryotherapy, Electrical stimulation, Iontophoresis with 4 mg/ml dexamethasome, Moist heat, traction, Ultrasound, gait training, Therapeutic exercise, balance training, neuromuscular re-education, patient/family education, prosthetic training, manual techniques, passive ROM, dry needling, taping, vasopnuematic device,  vestibular, spinal manipulations, joint manipulations  PLAN FOR NEXT SESSION: , quad stretching, patella mobs, flexion ROM emphasis  Ivery Quale, PT, DPT 07/13/22 11:10 AM

## 2022-07-10 ENCOUNTER — Telehealth: Payer: Self-pay

## 2022-07-10 NOTE — Telephone Encounter (Signed)
     Patient visit on 4/21  at [facility]    Have you been able to follow up with your primary care physician? No   The patient was or was not able to obtain any needed medicine or equipment. Yes   Are there diet recommendations that you are having difficulty following? Na   Patient expresses understanding of discharge instructions and education provided has no other needs at this time.  Yes     Shatha Hooser [facility], [place] [phone] [address], [place], [place] [address] Phone: [phone] Email: [email or web address]    

## 2022-07-13 ENCOUNTER — Encounter: Payer: Self-pay | Admitting: Physical Therapy

## 2022-07-13 ENCOUNTER — Ambulatory Visit: Payer: PPO | Admitting: Physical Therapy

## 2022-07-13 DIAGNOSIS — M25561 Pain in right knee: Secondary | ICD-10-CM | POA: Diagnosis not present

## 2022-07-13 DIAGNOSIS — M25661 Stiffness of right knee, not elsewhere classified: Secondary | ICD-10-CM | POA: Diagnosis not present

## 2022-07-13 NOTE — Therapy (Signed)
OUTPATIENT PHYSICAL THERAPY LOWER EXTREMITY TREATMENT   Patient Name: Ricky Barton MRN: [id number] DOB:18-Nov-1955, 67 y.o., male Today's Date: 07/13/2022  END OF SESSION:  PT End of Session - 07/13/22 1140     Visit Number 2    Number of Visits 8    Date for PT Re-Evaluation 08/03/22    PT Start Time 1141    PT Stop Time 1228    PT Time Calculation (min) 47 min    Activity Tolerance Patient tolerated treatment well    Behavior During Therapy [facility] for tasks assessed/performed              Past Medical History:  Diagnosis Date   Arthritis    bilateral knees   Hypoglycemic syndrome    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   Procedure: LEFT TOTAL KNEE ARTHROPLASTY;  Surgeon: Tarry Kos, MD;  Location: [facility] OR;  Service: Orthopedics;  Laterality: Left;   TOTAL KNEE ARTHROPLASTY Right 04/03/2022   Procedure: RIGHT TOTAL KNEE ARTHROPLASTY;  Surgeon: Tarry Kos, MD;  Location: [facility] OR;  Service: Orthopedics;  Laterality: Right;   Patient Active Problem List   Diagnosis Date Noted   Status post total right knee replacement 04/03/2022   Primary osteoarthritis of right knee 02/04/2022   Primary osteoarthritis of left knee 05/12/2021   Status post total left knee replacement 05/12/2021   Allergic rhinitis 12/09/2020   Low serum vitamin B12 12/07/2020   Welcome to Medicare preventive visit 12/06/2020   Advanced directives,  counseling/discussion 12/06/2020   Angular cheilitis 12/06/2020   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   Elevated blood pressure reading without diagnosis of hypertension 11/10/2018   Renal lesion 03/28/2018   Arthralgia of hands, bilateral 09/12/2015   OSA on CPAP 09/12/2015   Obesity, Class I, BMI 30-34.9 04/05/2014   Hyperglycemia 03/30/2013   Healthcare maintenance 03/04/2012   HLD (hyperlipidemia) 01/18/2009   ERECTILE DYSFUNCTION, 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  He had issue kidney stone issue over last week so did not make it to gym.  PERTINENT HISTORY: Bil TKA, Lt ear hearing loss, h/o seizures, PAIN:  Are you having pain? Yes: NPRS scale: 0/10 Pain location: Rt anterior knee and runs down shin Pain description: tightness Aggravating factors: bending his knee, sitting for too long Relieving factors: goes away with movement  PRECAUTIONS:  None  WEIGHT BEARING RESTRICTIONS: No  FALLS:  Has patient fallen in last 6 months? No   PLOF: Independent  PATIENT GOALS: reduce stiffness and pain in knee  NEXT MD VISIT:   OBJECTIVE:   DIAGNOSTIC FINDINGS: XR Rt knee 05/15/22 impression "Well-seated prosthesis with complication"  PATIENT SURVEYS:  Eval: FOTO 09/25/22  COGNITION: Overall cognitive status: Within functional limits for tasks assessed     SENSATION:   EDEMA:    MUSCLE LENGTH: Tightness in quads and hamstring Rt leg  POSTURE:   PALPATION: some pain around patella tendon   LOWER EXTREMITY ROM:  Active  ROM/PROM Right eval Right 07/13/2022 A/P  Hip flexion    Hip extension    Hip abduction    Hip adduction    Hip internal rotation    Hip external rotation    Knee flexion 94/100 99/104  Knee extension 3/1   Ankle dorsiflexion    Ankle plantarflexion    Ankle inversion    Ankle eversion     (Blank rows = not tested)  LOWER EXTREMITY MMT:  MMT tested in sitting Right eval Left eval  Hip flexion 4+   Hip extension    Hip abduction 5   Hip adduction    Hip internal rotation    Hip external rotation    Knee flexion 5   Knee extension 5   Ankle dorsiflexion    Ankle plantarflexion    Ankle inversion    Ankle eversion     (Blank rows = not tested)  LOWER EXTREMITY SPECIAL TESTS:    FUNCTIONAL TESTS:   GAIT: Eval Comments: WFL but difficulty when first starts walking after sitting   TODAY'S TREATMENT:  07/13/2022: Manual therapy: Hypervolt gun to quad, patella mobs medial-lateral & prox-distal and scar mobs PROM with overpressure & contract relax for knee flexion PT demo & verbal cues on ice massage to knee, quad & patella tendon, and quadriceps muscle. Pt verbalized understanding.  PT demo & verbal cues on tennis ball massage to quad muscle. Pt verbalized understanding.   Therapeutic Exercise: Quad stretch supine hooklying strap knee flexion 30 sec hold 3 reps Standing RLE in chair bottom knee flexion stretch moving LLE closer to chair each reps 30 sec hold 5 reps.  Stand to sit working on knee flexion moving feet closer to chair every 3rd rep for total 9 reps. Bridge moving feet closer to trunk on 3 & 5 rep, total of 7 reps, 3 sets.  PT updated HEP with HO, demo & verbal cues. Pt verbalized understanding.   Eval HEP creation and review with demonstration and trial set preformed, see below for details Sci fit bike L3 X 8 min seat #9 Manual therapy X 8 min for Rt knee PROM into flexion with overpressure and patella mobs    PATIENT EDUCATION: Education  details: HEP, PT plan of care Person educated: Patient Education method: Explanation, Demonstration, Verbal cues, and Handouts Education comprehension: verbalized understanding and needs further education   HOME EXERCISE PROGRAM: Access Code: [email or web address] URL: [email or web address] Date: 07/13/2022 Prepared by: Vladimir Faster  Exercises - Supine Quadriceps Stretch with Strap on Table (Mirrored)  - 3 x daily - 6 x weekly - 3 reps - 30 hold - Seated Knee Flexion AAROM  - 3 x daily - 7 x weekly - 1 sets - 1 reps - 3 minutes hold - Supine Heel Slide with Strap  - 3 x daily - 6 x weekly - 1-2 sets - 10 reps -  5 hold - standing knee flexion stretch on chair or step  - 2 x daily - 6 x weekly - 1 sets - 10 reps - 10 hold - Seated Passive Knee Extension  - 2 x daily - 6 x weekly - 1 sets - 3 reps - 1-2 min hold - Supine Bridge  - 2 x daily - 6 x weekly - 3 sets - 7 reps - 5-10 seconds hold  Patient Education - Ice Massage  ASSESSMENT:  CLINICAL IMPRESSION: PT instructed pt in ice massage, tennis ball massage to muscle and vibration gun for self-care to decrease muscle tension. He appears to understand.  Pt had increase knee flexion range today compared to eval.   OBJECTIVE IMPAIRMENTS: decreased activity tolerance, difficulty walking, decreased endurance, decreased mobility, decreased ROM, decreased strength, impaired flexibility, and pain.  ACTIVITY LIMITATIONS: bending, lifting, carry, locomotion, cleaning,  PERSONAL FACTORS: Bil TKA, Lt ear hearing loss, h/o seizures, are also affecting patient's functional outcome.  REHAB POTENTIAL: Good  CLINICAL DECISION MAKING: Stable/uncomplicated  EVALUATION COMPLEXITY: Low    GOALS:  Long term PT goals Target date:08/03/2022   Pt will improve Rt knee ROM 2-105 degrees to improve functional mobility Baseline: Goal status: New Pt will improve  seated hip/knee strength to at least 5/5 MMT to improve functional  strength Baseline: Goal status: New Pt will improve FOTO to at least % functional to show improved function Baseline: Goal status: New Pt will be able to ambulate community distances at least 1000 ft WNL gait pattern without complaints Baseline: Goal status: New  PLAN: PT FREQUENCY: 2 times per week   PT DURATION: 4 weeks  PLANNED INTERVENTIONS (unless contraindicated): aquatic PT, Canalith repositioning, cryotherapy, Electrical stimulation, Iontophoresis with 4 mg/ml dexamethasome, Moist heat, traction, Ultrasound, gait training, Therapeutic exercise, balance training, neuromuscular re-education, patient/family education, prosthetic training, manual techniques, passive ROM, dry needling, taping, vasopnuematic device, vestibular, spinal manipulations, joint manipulations  PLAN FOR NEXT SESSION:  manual therapy & exercise for range with flexion ROM emphasis    Vladimir Faster, PT, DPT 07/13/2022, 12:43 PM

## 2022-07-15 ENCOUNTER — Encounter: Payer: Self-pay | Admitting: Physical Therapy

## 2022-07-15 ENCOUNTER — Ambulatory Visit: Payer: PPO | Admitting: Physical Therapy

## 2022-07-15 DIAGNOSIS — R600 Localized edema: Secondary | ICD-10-CM | POA: Diagnosis not present

## 2022-07-15 DIAGNOSIS — M6281 Muscle weakness (generalized): Secondary | ICD-10-CM | POA: Diagnosis not present

## 2022-07-15 DIAGNOSIS — R262 Difficulty in walking, not elsewhere classified: Secondary | ICD-10-CM

## 2022-07-15 DIAGNOSIS — M25561 Pain in right knee: Secondary | ICD-10-CM

## 2022-07-15 DIAGNOSIS — M25661 Stiffness of right knee, not elsewhere classified: Secondary | ICD-10-CM | POA: Diagnosis not present

## 2022-07-15 NOTE — Therapy (Signed)
OUTPATIENT PHYSICAL THERAPY LOWER EXTREMITY TREATMENT   Patient Name: Ricky Barton MRN: [id number] DOB:January 07, 1956, 67 y.o., male Today's Date: 07/15/2022  END OF SESSION:  PT End of Session - 07/15/22 1302     Visit Number 3    Number of Visits 8    Date for PT Re-Evaluation 08/03/22    Authorization Type Ortho bundle    PT Start Time 1145    PT Stop Time 1226    PT Time Calculation (min) 41 min    Activity Tolerance Patient tolerated treatment well    Behavior During Therapy [facility] for tasks assessed/performed               Past Medical History:  Diagnosis Date   Arthritis    bilateral knees   Hypoglycemic syndrome    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   Procedure: LEFT TOTAL KNEE ARTHROPLASTY;  Surgeon: Tarry Kos, MD;  Location: [facility] OR;  Service: Orthopedics;  Laterality: Left;   TOTAL KNEE ARTHROPLASTY Right 04/03/2022   Procedure: RIGHT TOTAL KNEE ARTHROPLASTY;  Surgeon: Tarry Kos, MD;  Location: [facility] OR;  Service: Orthopedics;  Laterality: Right;   Patient Active Problem List   Diagnosis Date Noted   Status post total right knee replacement 04/03/2022   Primary osteoarthritis of right knee 02/04/2022   Primary osteoarthritis of left knee 05/12/2021   Status post total left knee replacement 05/12/2021   Allergic rhinitis 12/09/2020   Low serum vitamin B12 12/07/2020   Welcome to Medicare preventive visit 12/06/2020    Advanced directives, counseling/discussion 12/06/2020   Angular cheilitis 12/06/2020   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   Elevated blood pressure reading without diagnosis of hypertension 11/10/2018   Renal lesion 03/28/2018   Arthralgia of hands, bilateral 09/12/2015   OSA on CPAP 09/12/2015   Obesity, Class I, BMI 30-34.9 04/05/2014   Hyperglycemia 03/30/2013   Healthcare maintenance 03/04/2012   HLD (hyperlipidemia) 01/18/2009   ERECTILE DYSFUNCTION, 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   History of seizure 08/15/1991    PCP: Eustaquio Boyden, MD   REFERRING PROVIDER: Cristie Hem, PA-C  REFERRING DIAG: [phone] (ICD-10-CM) - Status post total right knee replacement   THERAPY DIAG:  Stiffness of right knee, not elsewhere classified  Right knee pain, unspecified chronicity  Muscle weakness (generalized)  Localized edema  Difficulty in walking, not elsewhere classified  Rationale for Evaluation and Treatment: Rehabilitation  ONSET DATE: 04/03/22 Rt TKA  SUBJECTIVE:   SUBJECTIVE STATEMENT: He has been working on his stretches, not really having pain but some soreness in his knee  PERTINENT HISTORY: Bil TKA, Lt ear hearing loss, h/o seizures, PAIN:  Are you having pain? Yes: NPRS scale: 0/10 Pain location: Rt anterior knee and runs down shin Pain description: tightness Aggravating factors: bending  his knee, sitting for too long Relieving factors: goes away with movement  PRECAUTIONS: None  WEIGHT BEARING RESTRICTIONS: No  FALLS:  Has patient fallen in last 6 months? No   PLOF: Independent  PATIENT GOALS: reduce stiffness and pain in knee  NEXT MD VISIT:   OBJECTIVE:   DIAGNOSTIC FINDINGS: XR Rt knee 05/15/22 impression "Well-seated prosthesis with complication"  PATIENT SURVEYS:  Eval: FOTO 09/25/22  COGNITION: Overall cognitive status: Within functional limits for tasks assessed     SENSATION:   EDEMA:    MUSCLE LENGTH: Tightness in quads and hamstring  Rt leg  POSTURE:   PALPATION: some pain around patella tendon   LOWER EXTREMITY ROM:  Active ROM/PROM Right eval Right 07/13/2022 A/P  Hip flexion    Hip extension    Hip abduction    Hip adduction    Hip internal rotation    Hip external rotation    Knee flexion 94/100 99/104  Knee extension 3/1   Ankle dorsiflexion    Ankle plantarflexion    Ankle inversion    Ankle eversion     (Blank rows = not tested)  LOWER EXTREMITY MMT:  MMT tested in sitting Right eval Left eval  Hip flexion 4+   Hip extension    Hip abduction 5   Hip adduction    Hip internal rotation    Hip external rotation    Knee flexion 5   Knee extension 5   Ankle dorsiflexion    Ankle plantarflexion    Ankle inversion    Ankle eversion     (Blank rows = not tested)  LOWER EXTREMITY SPECIAL TESTS:    FUNCTIONAL TESTS:   GAIT: Eval Comments: WFL but difficulty when first starts walking after sitting   TODAY'S TREATMENT:  07/15/22 Sci fit bike L5 X 8 minutes for ROM Standing knee flexion lunge stretch 5 sec X 10 Step ups leading with Rt and down in front with left on 6 inch step X 10 TRX squats with 3 second hold at max flexion X 10 Leg press Rt leg only 62# slowly moving into max flexion and extension 2X10  Manual therapy: IASTM and cupping therapy for soft tissue and scar mobilization to Rt knee, patella mobs, PROM to Rt knee with overpressure and flexion mobilizations at end range grade 4  07/13/2022: Manual therapy: Hypervolt gun to quad, patella mobs medial-lateral & prox-distal and scar mobs PROM with overpressure & contract relax for knee flexion PT demo & verbal cues on ice massage to knee, quad & patella tendon, and quadriceps muscle. Pt verbalized understanding.  PT demo & verbal cues on tennis ball massage to quad muscle. Pt verbalized understanding.   Therapeutic Exercise: Quad stretch supine hooklying strap knee flexion 30 sec hold 3 reps Standing RLE in chair bottom  knee flexion stretch moving LLE closer to chair each reps 30 sec hold 5 reps.  Stand to sit working on knee flexion moving feet closer to chair every 3rd rep for total 9 reps. Bridge moving feet closer to trunk on 3 & 5 rep, total of 7 reps, 3 sets.  PT updated HEP with HO, demo & verbal cues. Pt verbalized understanding.   Eval HEP creation and review with demonstration and trial set preformed, see below for details Sci fit bike L3 X 8 min seat #9 Manual therapy X 8 min for Rt knee PROM into flexion with overpressure and patella mobs    PATIENT EDUCATION: Education details: HEP, PT plan of  care Person educated: Patient Education method: Explanation, Demonstration, Verbal cues, and Handouts Education comprehension: verbalized understanding and needs further education   HOME EXERCISE PROGRAM: Access Code: [email or web address] URL: [email or web address] Date: 07/13/2022 Prepared by: Vladimir Faster  Exercises - Supine Quadriceps Stretch with Strap on Table (Mirrored)  - 3 x daily - 6 x weekly - 3 reps - 30 hold - Seated Knee Flexion AAROM  - 3 x daily - 7 x weekly - 1 sets - 1 reps - 3 minutes hold - Supine Heel Slide with Strap  - 3 x daily - 6 x weekly - 1-2 sets - 10 reps - 5 hold - standing knee flexion stretch on chair or step  - 2 x daily - 6 x weekly - 1 sets - 10 reps - 10 hold - Seated Passive Knee Extension  - 2 x daily - 6 x weekly - 1 sets - 3 reps - 1-2 min hold - Supine Bridge  - 2 x daily - 6 x weekly - 3 sets - 7 reps - 5-10 seconds hold  Patient Education - Ice Massage  ASSESSMENT:  CLINICAL IMPRESSION: We focused on flexion ROM today with his exercises and manual therapy. I did try IASTM and cupping therapy to see if this will help with any of the tightness in his knee.  OBJECTIVE IMPAIRMENTS: decreased activity tolerance, difficulty walking, decreased endurance, decreased mobility, decreased ROM, decreased strength, impaired flexibility, and pain.  ACTIVITY  LIMITATIONS: bending, lifting, carry, locomotion, cleaning,  PERSONAL FACTORS: Bil TKA, Lt ear hearing loss, h/o seizures, are also affecting patient's functional outcome.  REHAB POTENTIAL: Good  CLINICAL DECISION MAKING: Stable/uncomplicated  EVALUATION COMPLEXITY: Low    GOALS:  Long term PT goals Target date:08/03/2022   Pt will improve Rt knee ROM 2-105 degrees to improve functional mobility Baseline: Goal status: New Pt will improve  seated hip/knee strength to at least 5/5 MMT to improve functional strength Baseline: Goal status: New Pt will improve FOTO to at least % functional to show improved function Baseline: Goal status: New Pt will be able to ambulate community distances at least 1000 ft WNL gait pattern without complaints Baseline: Goal status: New  PLAN: PT FREQUENCY: 2 times per week   PT DURATION: 4 weeks  PLANNED INTERVENTIONS (unless contraindicated): aquatic PT, Canalith repositioning, cryotherapy, Electrical stimulation, Iontophoresis with 4 mg/ml dexamethasome, Moist heat, traction, Ultrasound, gait training, Therapeutic exercise, balance training, neuromuscular re-education, patient/family education, prosthetic training, manual techniques, passive ROM, dry needling, taping, vasopnuematic device, vestibular, spinal manipulations, joint manipulations  PLAN FOR NEXT SESSION:  manual therapy & exercise for range with flexion ROM emphasis    April Manson, PT, DPT 07/15/2022, 1:03 PM

## 2022-07-20 ENCOUNTER — Ambulatory Visit: Payer: PPO | Admitting: Physical Therapy

## 2022-07-20 ENCOUNTER — Encounter: Payer: Self-pay | Admitting: Physical Therapy

## 2022-07-20 DIAGNOSIS — M6281 Muscle weakness (generalized): Secondary | ICD-10-CM | POA: Diagnosis not present

## 2022-07-20 DIAGNOSIS — M25561 Pain in right knee: Secondary | ICD-10-CM | POA: Diagnosis not present

## 2022-07-20 DIAGNOSIS — M25661 Stiffness of right knee, not elsewhere classified: Secondary | ICD-10-CM

## 2022-07-20 NOTE — Therapy (Signed)
OUTPATIENT PHYSICAL THERAPY LOWER EXTREMITY TREATMENT   Patient Name: Ricky Barton MRN: [id number] DOB:May 01, 1955, 67 y.o., male Today's Date: 07/20/2022  END OF SESSION:  PT End of Session - 07/20/22 0845     Visit Number 4    Number of Visits 8    Date for PT Re-Evaluation 08/03/22    Authorization Type [employer]    PT Start Time 0845    PT Stop Time 0932    PT Time Calculation (min) 47 min    Activity Tolerance Patient tolerated treatment well    Behavior During Therapy [facility] for tasks assessed/performed               Past Medical History:  Diagnosis Date   Arthritis    bilateral knees   Hypoglycemic syndrome    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   Procedure: LEFT TOTAL KNEE ARTHROPLASTY;  Surgeon: Tarry Kos, MD;  Location: [facility] OR;  Service: Orthopedics;  Laterality: Left;   TOTAL KNEE ARTHROPLASTY Right 04/03/2022   Procedure: RIGHT TOTAL KNEE ARTHROPLASTY;  Surgeon: Tarry Kos, MD;  Location: [facility] OR;  Service: Orthopedics;  Laterality: Right;   Patient Active Problem List   Diagnosis Date Noted   Status post total right knee replacement 04/03/2022   Primary osteoarthritis of right knee 02/04/2022   Primary osteoarthritis of left knee 05/12/2021   Status post total left knee replacement 05/12/2021   Allergic rhinitis 12/09/2020   Low serum vitamin B12 12/07/2020   Welcome to Medicare preventive visit 12/06/2020    Advanced directives, counseling/discussion 12/06/2020   Angular cheilitis 12/06/2020   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   Elevated blood pressure reading without diagnosis of hypertension 11/10/2018   Renal lesion 03/28/2018   Arthralgia of hands, bilateral 09/12/2015   OSA on CPAP 09/12/2015   Obesity, Class I, BMI 30-34.9 04/05/2014   Hyperglycemia 03/30/2013   Healthcare maintenance 03/04/2012   HLD (hyperlipidemia) 01/18/2009   ERECTILE DYSFUNCTION, 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 He is overall sleeping better and got a massage gun for home he is using.   PERTINENT HISTORY: Bil TKA, Lt ear hearing loss, h/o seizures, PAIN:  Are you having pain? Yes: NPRS scale: 0/10 Pain location: Rt anterior  knee and runs down shin Pain description: tightness Aggravating factors: bending his knee, sitting for too long Relieving factors: goes away with movement  PRECAUTIONS: None  WEIGHT BEARING RESTRICTIONS: No  FALLS:  Has patient fallen in last 6 months? No   PLOF: Independent  PATIENT GOALS: reduce stiffness and pain in knee  NEXT MD VISIT:   OBJECTIVE:   DIAGNOSTIC FINDINGS: XR Rt knee 05/15/22 impression "Well-seated prosthesis with complication"  PATIENT SURVEYS:  Eval: FOTO 09/25/22  COGNITION: Overall cognitive status: Within functional limits for tasks assessed     SENSATION:   EDEMA:    MUSCLE LENGTH:  Tightness in quads and hamstring Rt leg  POSTURE:   PALPATION: some pain around patella tendon   LOWER EXTREMITY ROM:  Active ROM/PROM Right eval Right 07/13/2022 A/P  Hip flexion    Hip extension    Hip abduction    Hip adduction    Hip internal rotation    Hip external rotation    Knee flexion 94/100 99/104  Knee extension 3/1   Ankle dorsiflexion    Ankle plantarflexion    Ankle inversion    Ankle eversion     (Blank rows = not tested)  LOWER EXTREMITY MMT:  MMT tested in sitting Right eval Left eval  Hip flexion 4+   Hip extension    Hip abduction 5   Hip adduction    Hip internal rotation    Hip external rotation    Knee flexion 5   Knee extension 5   Ankle dorsiflexion    Ankle plantarflexion    Ankle inversion    Ankle eversion     (Blank rows = not tested)  LOWER EXTREMITY SPECIAL TESTS:    FUNCTIONAL TESTS:   GAIT: Eval Comments: WFL but difficulty when first starts walking after sitting   TODAY'S TREATMENT:  07/20/22 Sci fit bike seat #9, L5 X 8 minutes for ROM Seated knee extension machine 20# DL 1O10, 96# SL X 15 each side Standing knee flexion lunge stretch 5 sec X 10 Step ups leading with Rt and down in front with left on 6 inch step X 15 Leg press Rt leg only 68# slowly moving into max flexion and extension 2X10  Manual therapy: IASTM and cupping therapy for soft tissue and scar mobilization to Rt knee, patella mobs, PROM to Rt knee with overpressure and flexion mobilizations at end range grade 4  07/15/22 Sci fit bike L5 X 8 minutes for ROM Standing knee flexion lunge stretch 5 sec X 10 Step ups leading with Rt and down in front with left on 6 inch step X 10 TRX squats with 3 second hold at max flexion X 10 Leg press Rt leg only 62# slowly moving into max flexion and extension 2X10  Manual therapy: IASTM and cupping therapy for soft tissue and scar mobilization to Rt knee, patella mobs, PROM to Rt knee with overpressure and flexion  mobilizations at end range grade 4  07/13/2022: Manual therapy: Hypervolt gun to quad, patella mobs medial-lateral & prox-distal and scar mobs PROM with overpressure & contract relax for knee flexion PT demo & verbal cues on ice massage to knee, quad & patella tendon, and quadriceps muscle. Pt verbalized understanding.  PT demo & verbal cues on tennis ball massage to quad muscle. Pt verbalized understanding.   Therapeutic Exercise: Quad stretch supine hooklying strap knee flexion 30 sec hold 3 reps Standing RLE in chair bottom knee flexion stretch moving LLE closer to chair each  reps 30 sec hold 5 reps.  Stand to sit working on knee flexion moving feet closer to chair every 3rd rep for total 9 reps. Bridge moving feet closer to trunk on 3 & 5 rep, total of 7 reps, 3 sets.  PT updated HEP with HO, demo & verbal cues. Pt verbalized understanding.   Eval HEP creation and review with demonstration and trial set preformed, see below for details Sci fit bike L3 X 8 min seat #9 Manual therapy X 8 min for Rt knee PROM into flexion with overpressure and patella mobs    PATIENT EDUCATION: Education details: HEP, PT plan of care Person educated: Patient Education method: Explanation, Demonstration, Verbal cues, and Handouts Education comprehension: verbalized understanding and needs further education   HOME EXERCISE PROGRAM: Access Code: [email or web address] URL: [email or web address] Date: 07/13/2022 Prepared by: Vladimir Faster  Exercises - Supine Quadriceps Stretch with Strap on Table (Mirrored)  - 3 x daily - 6 x weekly - 3 reps - 30 hold - Seated Knee Flexion AAROM  - 3 x daily - 7 x weekly - 1 sets - 1 reps - 3 minutes hold - Supine Heel Slide with Strap  - 3 x daily - 6 x weekly - 1-2 sets - 10 reps - 5 hold - standing knee flexion stretch on chair or step  - 2 x daily - 6 x weekly - 1 sets - 10 reps - 10 hold - Seated Passive Knee Extension  - 2 x daily - 6 x weekly - 1 sets - 3  reps - 1-2 min hold - Supine Bridge  - 2 x daily - 6 x weekly - 3 sets - 7 reps - 5-10 seconds hold  Patient Education - Ice Massage  ASSESSMENT:  CLINICAL IMPRESSION: We continued to focus on Rt knee ROM and strengthening as tolerated with manual therapy. His knee does feel a little less stiff overall.   OBJECTIVE IMPAIRMENTS: decreased activity tolerance, difficulty walking, decreased endurance, decreased mobility, decreased ROM, decreased strength, impaired flexibility, and pain.  ACTIVITY LIMITATIONS: bending, lifting, carry, locomotion, cleaning,  PERSONAL FACTORS: Bil TKA, Lt ear hearing loss, h/o seizures, are also affecting patient's functional outcome.  REHAB POTENTIAL: Good  CLINICAL DECISION MAKING: Stable/uncomplicated  EVALUATION COMPLEXITY: Low    GOALS:  Long term PT goals Target date:08/03/2022   Pt will improve Rt knee ROM 2-105 degrees to improve functional mobility Baseline: Goal status: New Pt will improve  seated hip/knee strength to at least 5/5 MMT to improve functional strength Baseline: Goal status: New Pt will improve FOTO to at least % functional to show improved function Baseline: Goal status: New Pt will be able to ambulate community distances at least 1000 ft WNL gait pattern without complaints Baseline: Goal status: New  PLAN: PT FREQUENCY: 2 times per week   PT DURATION: 4 weeks  PLANNED INTERVENTIONS (unless contraindicated): aquatic PT, Canalith repositioning, cryotherapy, Electrical stimulation, Iontophoresis with 4 mg/ml dexamethasome, Moist heat, traction, Ultrasound, gait training, Therapeutic exercise, balance training, neuromuscular re-education, patient/family education, prosthetic training, manual techniques, passive ROM, dry needling, taping, vasopnuematic device, vestibular, spinal manipulations, joint manipulations  PLAN FOR NEXT SESSION:  manual therapy & exercise for range with flexion ROM emphasis    April Manson,  PT, DPT 07/20/2022, 9:42 AM

## 2022-07-22 ENCOUNTER — Encounter: Payer: PPO | Admitting: Physical Therapy

## 2022-07-23 ENCOUNTER — Ambulatory Visit: Payer: PPO | Admitting: Physical Therapy

## 2022-07-23 ENCOUNTER — Encounter: Payer: Self-pay | Admitting: Physical Therapy

## 2022-07-23 DIAGNOSIS — M25561 Pain in right knee: Secondary | ICD-10-CM

## 2022-07-23 DIAGNOSIS — M6281 Muscle weakness (generalized): Secondary | ICD-10-CM | POA: Diagnosis not present

## 2022-07-23 DIAGNOSIS — M25661 Stiffness of right knee, not elsewhere classified: Secondary | ICD-10-CM | POA: Diagnosis not present

## 2022-07-23 DIAGNOSIS — R262 Difficulty in walking, not elsewhere classified: Secondary | ICD-10-CM

## 2022-07-23 DIAGNOSIS — R600 Localized edema: Secondary | ICD-10-CM | POA: Diagnosis not present

## 2022-07-23 NOTE — Therapy (Signed)
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   Procedure: LEFT TOTAL KNEE ARTHROPLASTY;  Surgeon: Tarry Kos, MD;  Location: [facility] OR;  Service: Orthopedics;  Laterality: Left;   TOTAL KNEE ARTHROPLASTY Right 04/03/2022   Procedure: RIGHT TOTAL KNEE ARTHROPLASTY;  Surgeon: Tarry Kos, MD;  Location: [facility] OR;  Service: Orthopedics;  Laterality: Right;   Patient Active Problem List   Diagnosis Date Noted   Status post total right knee replacement 04/03/2022   Primary osteoarthritis of right knee 02/04/2022   Primary osteoarthritis of left knee 05/12/2021   Status post total left knee replacement 05/12/2021   Allergic rhinitis 12/09/2020   Low serum vitamin B12 12/07/2020   Welcome to Medicare preventive visit 12/06/2020   Advanced directives, counseling/discussion 12/06/2020   Angular cheilitis 12/06/2020   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   Elevated blood pressure reading without diagnosis of hypertension 11/10/2018   Renal lesion 03/28/2018   Arthralgia of hands, bilateral 09/12/2015   OSA on CPAP 09/12/2015   Obesity, Class I, BMI 30-34.9 04/05/2014   Hyperglycemia  03/30/2013   Healthcare maintenance 03/04/2012   HLD (hyperlipidemia) 01/18/2009   ERECTILE DYSFUNCTION, 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  Rationale for Evaluation and Treatment: Rehabilitation  ONSET DATE: 04/03/22 Rt TKA  SUBJECTIVE:   SUBJECTIVE STATEMENT: He denies pain but is having some soreness in his knee. He does report he went to the gym this morning and rode the bike, did the leg press, and did some squats.   PERTINENT HISTORY: Bil TKA, Lt ear hearing loss, h/o seizures, PAIN:  Are you having pain? Yes: NPRS scale: NO pain but 6/10 soreness Pain location: Rt anterior knee and runs down shin Pain description: tightness Aggravating factors: bending his knee, sitting for too long Relieving factors: goes away with movement  PRECAUTIONS: None  WEIGHT BEARING RESTRICTIONS: No  FALLS:  Has patient fallen in last 6 months? No   PLOF: Independent  PATIENT GOALS: reduce stiffness and pain in knee  NEXT MD VISIT:   OBJECTIVE:   DIAGNOSTIC FINDINGS: XR Rt knee 05/15/22 impression "Well-seated prosthesis with complication"  PATIENT SURVEYS:  Eval: FOTO 09/25/22  COGNITION: Overall cognitive status: Within functional limits for tasks assessed     SENSATION:   EDEMA:  MUSCLE LENGTH: Tightness in quads and hamstring Rt leg  POSTURE:   PALPATION: some pain around patella tendon   LOWER EXTREMITY ROM:  Active ROM/PROM Right eval Right 07/13/2022 A/P Right 07/23/22  Hip flexion     Hip extension     Hip abduction     Hip adduction     Hip internal rotation     Hip external rotation     Knee flexion 94/100 99/104 98/105  Knee extension 3/1    Ankle dorsiflexion     Ankle plantarflexion     Ankle inversion     Ankle eversion      (Blank  rows = not tested)  LOWER EXTREMITY MMT:  MMT tested in sitting Right eval Left eval  Hip flexion 4+   Hip extension    Hip abduction 5   Hip adduction    Hip internal rotation    Hip external rotation    Knee flexion 5   Knee extension 5   Ankle dorsiflexion    Ankle plantarflexion    Ankle inversion    Ankle eversion     (Blank rows = not tested)  LOWER EXTREMITY SPECIAL TESTS:    FUNCTIONAL TESTS:   GAIT: Eval Comments: WFL but difficulty when first starts walking after sitting   TODAY'S TREATMENT:  07/23/22 Sci fit bike seat #9, L5 X 8 minutes for ROM Seated knee extension machine 20# DL [id number] Seated knee flexion AAROM stretch 5 sec X 10 Supine quad stretch with strap 30 sec X 3 Supine heelslides AAROM 5 sec X 10 Leg press (held today as he already did this at gym) Manual therapy: IASTM and cupping therapy for soft tissue and scar mobilization to Rt knee, patella mobs, PROM to Rt knee with overpressure and flexion mobilizations at end range grade 4  07/20/22 Sci fit bike seat #9, L5 X 8 minutes for ROM Seated knee extension machine 20# DL [id number], 28# SL X 15 each side Standing knee flexion lunge stretch 5 sec X 10 Step ups leading with Rt and down in front with left on 6 inch step X 15 Leg press Rt leg only 68# slowly moving into max flexion and extension 2X10  Manual therapy: IASTM and cupping therapy for soft tissue and scar mobilization to Rt knee, patella mobs, PROM to Rt knee with overpressure and flexion mobilizations at end range grade 4  07/15/22 Sci fit bike L5 X 8 minutes for ROM Standing knee flexion lunge stretch 5 sec X 10 Step ups leading with Rt and down in front with left on 6 inch step X 10 TRX squats with 3 second hold at max flexion X 10 Leg press Rt leg only 62# slowly moving into max flexion and extension 2X10  Manual therapy: IASTM and cupping therapy for soft tissue and scar mobilization to Rt knee, patella mobs, PROM to Rt knee with  overpressure and flexion mobilizations at end range grade 4  07/13/2022: Manual therapy: Hypervolt gun to quad, patella mobs medial-lateral & prox-distal and scar mobs PROM with overpressure & contract relax for knee flexion PT demo & verbal cues on ice massage to knee, quad & patella tendon, and quadriceps muscle. Pt verbalized understanding.  PT demo & verbal cues on tennis ball massage to quad muscle. Pt verbalized understanding.   Therapeutic Exercise: Quad stretch supine hooklying strap knee flexion 30 sec hold 3 reps Standing RLE in chair bottom knee flexion stretch moving LLE closer to chair each reps 30 sec  hold 5 reps.  Stand to sit working on knee flexion moving feet closer to chair every 3rd rep for total 9 reps. Bridge moving feet closer to trunk on 3 & 5 rep, total of 7 reps, 3 sets.  PT updated HEP with HO, demo & verbal cues. Pt verbalized understanding.   Eval HEP creation and review with demonstration and trial set preformed, see below for details Sci fit bike L3 X 8 min seat #9 Manual therapy X 8 min for Rt knee PROM into flexion with overpressure and patella mobs    PATIENT EDUCATION: Education details: HEP, PT plan of care Person educated: Patient Education method: Explanation, Demonstration, Verbal cues, and Handouts Education comprehension: verbalized understanding and needs further education   HOME EXERCISE PROGRAM: Access Code: [email or web address] URL: [email or web address] Date: 07/13/2022 Prepared by: Vladimir Faster  Exercises - Supine Quadriceps Stretch with Strap on Table (Mirrored)  - 3 x daily - 6 x weekly - 3 reps - 30 hold - Seated Knee Flexion AAROM  - 3 x daily - 7 x weekly - 1 sets - 1 reps - 3 minutes hold - Supine Heel Slide with Strap  - 3 x daily - 6 x weekly - 1-2 sets - 10 reps - 5 hold - standing knee flexion stretch on chair or step  - 2 x daily - 6 x weekly - 1 sets - 10 reps - 10 hold - Seated Passive Knee Extension  - 2 x daily -  6 x weekly - 1 sets - 3 reps - 1-2 min hold - Supine Bridge  - 2 x daily - 6 x weekly - 3 sets - 7 reps - 5-10 seconds hold  Patient Education - Ice Massage  ASSESSMENT:  CLINICAL IMPRESSION: He already went to gym and worked on some strengthening this morning so more focus on his knee ROM and manual therapy today. We will continue with max efforts to improve knee ROM as much as possible  OBJECTIVE IMPAIRMENTS: decreased activity tolerance, difficulty walking, decreased endurance, decreased mobility, decreased ROM, decreased strength, impaired flexibility, and pain.  ACTIVITY LIMITATIONS: bending, lifting, carry, locomotion, cleaning,  PERSONAL FACTORS: Bil TKA, Lt ear hearing loss, h/o seizures, are also affecting patient's functional outcome.  REHAB POTENTIAL: Good  CLINICAL DECISION MAKING: Stable/uncomplicated  EVALUATION COMPLEXITY: Low    GOALS:  Long term PT goals Target date:08/03/2022   Pt will improve Rt knee ROM 2-105 degrees to improve functional mobility Baseline: Goal status: New Pt will improve  seated hip/knee strength to at least 5/5 MMT to improve functional strength Baseline: Goal status: New Pt will improve FOTO to at least % functional to show improved function Baseline: Goal status: New Pt will be able to ambulate community distances at least 1000 ft WNL gait pattern without complaints Baseline: Goal status: New  PLAN: PT FREQUENCY: 2 times per week   PT DURATION: 4 weeks  PLANNED INTERVENTIONS (unless contraindicated): aquatic PT, Canalith repositioning, cryotherapy, Electrical stimulation, Iontophoresis with 4 mg/ml dexamethasome, Moist heat, traction, Ultrasound, gait training, Therapeutic exercise, balance training, neuromuscular re-education, patient/family education, prosthetic training, manual techniques, passive ROM, dry needling, taping, vasopnuematic device, vestibular, spinal manipulations, joint manipulations  PLAN FOR NEXT SESSION:   manual therapy & exercise for range with flexion ROM emphasis    April Manson, PT, DPT 07/23/2022, 1:42 PM

## 2022-07-27 ENCOUNTER — Encounter: Payer: Self-pay | Admitting: Physical Therapy

## 2022-07-27 ENCOUNTER — Ambulatory Visit: Payer: PPO | Admitting: Physical Therapy

## 2022-07-27 DIAGNOSIS — R600 Localized edema: Secondary | ICD-10-CM | POA: Diagnosis not present

## 2022-07-27 DIAGNOSIS — M6281 Muscle weakness (generalized): Secondary | ICD-10-CM | POA: Diagnosis not present

## 2022-07-27 DIAGNOSIS — M25661 Stiffness of right knee, not elsewhere classified: Secondary | ICD-10-CM | POA: Diagnosis not present

## 2022-07-27 DIAGNOSIS — M25561 Pain in right knee: Secondary | ICD-10-CM

## 2022-07-27 NOTE — Therapy (Signed)
OUTPATIENT PHYSICAL THERAPY LOWER EXTREMITY TREATMENT   Patient Name: Ricky Barton MRN: [id number] DOB:1955/11/01, 67 y.o., male Today's Date: 07/27/2022  END OF SESSION:  PT End of Session - 07/27/22 0845     Visit Number 6    Number of Visits 8    Date for PT Re-Evaluation 08/03/22    Authorization Type Ortho bundle    PT Start Time 0845    PT Stop Time 0925    PT Time Calculation (min) 40 min    Activity Tolerance Patient tolerated treatment well    Behavior During Therapy [facility] for tasks assessed/performed                Past Medical History:  Diagnosis Date   Arthritis    bilateral knees   Hypoglycemic syndrome    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   Procedure: LEFT TOTAL KNEE ARTHROPLASTY;  Surgeon: Tarry Kos, MD;  Location: [facility] OR;  Service: Orthopedics;  Laterality: Left;   TOTAL KNEE ARTHROPLASTY Right 04/03/2022   Procedure: RIGHT TOTAL KNEE ARTHROPLASTY;  Surgeon: Tarry Kos, MD;  Location: [facility] OR;  Service: Orthopedics;  Laterality: Right;   Patient Active Problem List   Diagnosis Date Noted   Status post total right knee replacement 04/03/2022   Primary osteoarthritis of right knee 02/04/2022   Primary osteoarthritis of left knee 05/12/2021   Status post total left knee replacement 05/12/2021   Allergic rhinitis 12/09/2020   Low serum vitamin B12 12/07/2020   Welcome to Medicare preventive visit  12/06/2020   Advanced directives, counseling/discussion 12/06/2020   Angular cheilitis 12/06/2020   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   Elevated blood pressure reading without diagnosis of hypertension 11/10/2018   Renal lesion 03/28/2018   Arthralgia of hands, bilateral 09/12/2015   OSA on CPAP 09/12/2015   Obesity, Class I, BMI 30-34.9 04/05/2014   Hyperglycemia 03/30/2013   Healthcare maintenance 03/04/2012   HLD (hyperlipidemia) 01/18/2009   ERECTILE DYSFUNCTION, 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   PERTINENT HISTORY: Bil TKA, Lt ear hearing loss, h/o seizures, PAIN:  Are you having pain? Yes: NPRS scale: NO pain but 6/10 soreness Pain location: Rt anterior knee and runs down shin Pain description: tightness Aggravating factors: bending his knee, sitting for  too long Relieving factors: goes away with movement  PRECAUTIONS: None  WEIGHT BEARING RESTRICTIONS: No  FALLS:  Has patient fallen in last 6 months? No   PLOF: Independent  PATIENT GOALS: reduce stiffness and pain in knee  NEXT MD VISIT:   OBJECTIVE:   DIAGNOSTIC FINDINGS: XR Rt knee 05/15/22 impression "Well-seated prosthesis with complication"  PATIENT SURVEYS:  Eval: FOTO 09/25/22  COGNITION: Overall cognitive status: Within functional limits for tasks assessed     SENSATION:   EDEMA:    MUSCLE LENGTH: Tightness in quads and hamstring Rt leg  POSTURE:    PALPATION: some pain around patella tendon   LOWER EXTREMITY ROM:  Active ROM/PROM Right eval Right 07/13/2022 A/P Right 07/23/22  Hip flexion     Hip extension     Hip abduction     Hip adduction     Hip internal rotation     Hip external rotation     Knee flexion 94/100 99/104 98/105  Knee extension 3/1    Ankle dorsiflexion     Ankle plantarflexion     Ankle inversion     Ankle eversion      (Blank rows = not tested)  LOWER EXTREMITY MMT:  MMT tested in sitting Right eval Left eval  Hip flexion 4+   Hip extension    Hip abduction 5   Hip adduction    Hip internal rotation    Hip external rotation    Knee flexion 5   Knee extension 5   Ankle dorsiflexion    Ankle plantarflexion    Ankle inversion    Ankle eversion     (Blank rows = not tested)  LOWER EXTREMITY SPECIAL TESTS:    FUNCTIONAL TESTS:   GAIT: Eval Comments: WFL but difficulty when first starts walking after sitting   TODAY'S TREATMENT:  07/27/22 Sci fit bike seat #9, L5 X 8 minutes for ROM Standing lunge knee flexion AAROM stretch 5 sec X 15 with foot on treadmill Supine quad stretch with strap 30 sec X 3 Supine heelslides AAROM 5 sec X 10 Leg press Rt leg only 68# 2X10 holding 5 sec  Manual therapy: IASTM and cupping therapy for soft tissue and scar mobilization to Rt knee, patella mobs, PROM to Rt knee with overpressure and flexion mobilizations at end range grade 4  07/23/22 Sci fit bike seat #9, L5 X 8 minutes for ROM Seated knee extension machine 20# DL [id number] Seated knee flexion AAROM stretch 5 sec X 10 Supine quad stretch with strap 30 sec X 3 Supine heelslides AAROM 5 sec X 10 Leg press (held today as he already did this at gym) Manual therapy: IASTM and cupping therapy for soft tissue and scar mobilization to Rt knee, patella mobs, PROM to Rt knee with overpressure and flexion mobilizations at end range grade 4  07/20/22 Sci fit bike seat #9, L5 X 8 minutes for ROM Seated knee  extension machine 20# DL [id number], 14# SL X 15 each side Standing knee flexion lunge stretch 5 sec X 10 Step ups leading with Rt and down in front with left on 6 inch step X 15 Leg press Rt leg only 68# slowly moving into max flexion and extension 2X10  Manual therapy: IASTM and cupping therapy for soft tissue and scar mobilization to Rt knee, patella mobs, PROM to Rt knee with overpressure and flexion mobilizations at end range grade 4   PATIENT EDUCATION: Education details: HEP, PT plan of care Person educated: Patient  Education method: Explanation, Demonstration, Verbal cues, and Handouts Education comprehension: verbalized understanding and needs further education   HOME EXERCISE PROGRAM: Access Code: [email or web address] URL: [email or web address] Date: 07/13/2022 Prepared by: Vladimir Faster  Exercises - Supine Quadriceps Stretch with Strap on Table (Mirrored)  - 3 x daily - 6 x weekly - 3 reps - 30 hold - Seated Knee Flexion AAROM  - 3 x daily - 7 x weekly - 1 sets - 1 reps - 3 minutes hold - Supine Heel Slide with Strap  - 3 x daily - 6 x weekly - 1-2 sets - 10 reps - 5 hold - standing knee flexion stretch on chair or step  - 2 x daily - 6 x weekly - 1 sets - 10 reps - 10 hold - Seated Passive Knee Extension  - 2 x daily - 6 x weekly - 1 sets - 3 reps - 1-2 min hold - Supine Bridge  - 2 x daily - 6 x weekly - 3 sets - 7 reps - 5-10 seconds hold  Patient Education - Ice Massage  ASSESSMENT:  CLINICAL IMPRESSION: We continued to work to maximize his knee ROM as much as possible with stretching and manual therapy focus. He has 2 more visits left and will plan to discharge to independent gym and stretching program after that.   OBJECTIVE IMPAIRMENTS: decreased activity tolerance, difficulty walking, decreased endurance, decreased mobility, decreased ROM, decreased strength, impaired flexibility, and pain.  ACTIVITY LIMITATIONS: bending, lifting, carry, locomotion,  cleaning,  PERSONAL FACTORS: Bil TKA, Lt ear hearing loss, h/o seizures, are also affecting patient's functional outcome.  REHAB POTENTIAL: Good  CLINICAL DECISION MAKING: Stable/uncomplicated  EVALUATION COMPLEXITY: Low    GOALS:  Long term PT goals Target date:08/03/2022   Pt will improve Rt knee ROM 2-105 degrees to improve functional mobility Baseline: Goal status: New Pt will improve  seated hip/knee strength to at least 5/5 MMT to improve functional strength Baseline: Goal status: New Pt will improve FOTO to at least % functional to show improved function Baseline: Goal status: New Pt will be able to ambulate community distances at least 1000 ft WNL gait pattern without complaints Baseline: Goal status: New  PLAN: PT FREQUENCY: 2 times per week   PT DURATION: 4 weeks  PLANNED INTERVENTIONS (unless contraindicated): aquatic PT, Canalith repositioning, cryotherapy, Electrical stimulation, Iontophoresis with 4 mg/ml dexamethasome, Moist heat, traction, Ultrasound, gait training, Therapeutic exercise, balance training, neuromuscular re-education, patient/family education, prosthetic training, manual techniques, passive ROM, dry needling, taping, vasopnuematic device, vestibular, spinal manipulations, joint manipulations  PLAN FOR NEXT SESSION: check goals and transition to independent program over next 2 visit.    April Manson, PT, DPT 07/27/2022, 9:39 AM

## 2022-07-29 ENCOUNTER — Ambulatory Visit: Payer: PPO | Admitting: Physical Therapy

## 2022-07-29 ENCOUNTER — Encounter: Payer: Self-pay | Admitting: Physical Therapy

## 2022-07-29 DIAGNOSIS — M6281 Muscle weakness (generalized): Secondary | ICD-10-CM

## 2022-07-29 DIAGNOSIS — R600 Localized edema: Secondary | ICD-10-CM

## 2022-07-29 DIAGNOSIS — R262 Difficulty in walking, not elsewhere classified: Secondary | ICD-10-CM | POA: Diagnosis not present

## 2022-07-29 DIAGNOSIS — M25561 Pain in right knee: Secondary | ICD-10-CM

## 2022-07-29 DIAGNOSIS — M25661 Stiffness of right knee, not elsewhere classified: Secondary | ICD-10-CM

## 2022-07-29 NOTE — Therapy (Signed)
OUTPATIENT PHYSICAL THERAPY LOWER EXTREMITY TREATMENT   Patient Name: Ricky Barton MRN: [id number] DOB:1955-06-18, 67 y.o., male Today's Date: 07/29/2022  END OF SESSION:  PT End of Session - 07/29/22 0939     Visit Number 7    Number of Visits 8    Date for PT Re-Evaluation 08/03/22    Authorization Type Ortho bundle    PT Start Time 0845    PT Stop Time 0928    PT Time Calculation (min) 43 min    Activity Tolerance Patient tolerated treatment well    Behavior During Therapy [facility] for tasks assessed/performed                 Past Medical History:  Diagnosis Date   Arthritis    bilateral knees   Hypoglycemic syndrome    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   Procedure: LEFT TOTAL KNEE ARTHROPLASTY;  Surgeon: Tarry Kos, MD;  Location: [facility] OR;  Service: Orthopedics;  Laterality: Left;   TOTAL KNEE ARTHROPLASTY Right 04/03/2022   Procedure: RIGHT TOTAL KNEE ARTHROPLASTY;  Surgeon: Tarry Kos, MD;  Location: [facility] OR;  Service: Orthopedics;  Laterality: Right;   Patient Active Problem List   Diagnosis Date Noted   Status post total right knee replacement 04/03/2022   Primary osteoarthritis of right knee 02/04/2022   Primary osteoarthritis of left knee 05/12/2021   Status post total left knee replacement 05/12/2021   Allergic rhinitis 12/09/2020   Low serum vitamin B12 12/07/2020   Welcome to Medicare preventive visit  12/06/2020   Advanced directives, counseling/discussion 12/06/2020   Angular cheilitis 12/06/2020   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   Elevated blood pressure reading without diagnosis of hypertension 11/10/2018   Renal lesion 03/28/2018   Arthralgia of hands, bilateral 09/12/2015   OSA on CPAP 09/12/2015   Obesity, Class I, BMI 30-34.9 04/05/2014   Hyperglycemia 03/30/2013   Healthcare maintenance 03/04/2012   HLD (hyperlipidemia) 01/18/2009   ERECTILE DYSFUNCTION, 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   History of seizure 08/15/1991    PCP: Eustaquio Boyden, MD   REFERRING PROVIDER: Cristie Hem, PA-C  REFERRING DIAG: [phone] (ICD-10-CM) - Status post total right knee replacement   THERAPY DIAG:  Stiffness of right knee, not elsewhere classified  Right knee pain, unspecified chronicity  Muscle weakness (generalized)  Localized edema  Difficulty in walking, not elsewhere classified  Rationale for Evaluation and Treatment: Rehabilitation  ONSET DATE: 04/03/22 Rt TKA  SUBJECTIVE:   SUBJECTIVE STATEMENT: He reports some soreness and stiffness in his Rt knee, not really having pain except his other knee is sore.   PERTINENT HISTORY: Bil TKA, Lt ear hearing loss, h/o seizures, PAIN:  Are you having pain? Yes: NPRS scale: NO pain but 6/10 soreness Pain location: Rt anterior knee  and runs down shin Pain description: tightness Aggravating factors: bending his knee, sitting for too long Relieving factors: goes away with movement  PRECAUTIONS: None  WEIGHT BEARING RESTRICTIONS: No  FALLS:  Has patient fallen in last 6 months? No   PLOF: Independent  PATIENT GOALS: reduce stiffness and pain in knee  NEXT MD VISIT:   OBJECTIVE:   DIAGNOSTIC FINDINGS: XR Rt knee 05/15/22 impression "Well-seated prosthesis with complication"  PATIENT SURVEYS:  Eval: FOTO 09/25/22  COGNITION: Overall cognitive status: Within functional limits for tasks assessed     SENSATION:   EDEMA:     MUSCLE LENGTH: Tightness in quads and hamstring Rt leg  POSTURE:   PALPATION: some pain around patella tendon   LOWER EXTREMITY ROM:  Active ROM/PROM Right eval Right 07/13/2022 A/P Right 07/23/22  Hip flexion     Hip extension     Hip abduction     Hip adduction     Hip internal rotation     Hip external rotation     Knee flexion 94/100 99/104 98/105  Knee extension 3/1    Ankle dorsiflexion     Ankle plantarflexion     Ankle inversion     Ankle eversion      (Blank rows = not tested)  LOWER EXTREMITY MMT:  MMT tested in sitting Right eval Left eval  Hip flexion 4+   Hip extension    Hip abduction 5   Hip adduction    Hip internal rotation    Hip external rotation    Knee flexion 5   Knee extension 5   Ankle dorsiflexion    Ankle plantarflexion    Ankle inversion    Ankle eversion     (Blank rows = not tested)  LOWER EXTREMITY SPECIAL TESTS:    FUNCTIONAL TESTS:   GAIT: Eval Comments: WFL but difficulty when first starts walking after sitting   TODAY'S TREATMENT:  07/27/22 Sci fit bike seat #9, L5 X 8 minutes for ROM Standing lunge knee flexion AAROM stretch 5 sec X 15 with foot on treadmill Standing hamstring stretch 30 sec X 3 Supine quad stretch with strap 30 sec X 3 Leg press Rt leg only 68# 2X10 holding 5 sec  Manual therapy: IASTM and cupping therapy for soft tissue and scar mobilization to Rt knee, patella mobs, PROM to Rt knee with overpressure and flexion mobilizations at end range grade 4  07/23/22 Sci fit bike seat #9, L5 X 8 minutes for ROM Seated knee extension machine 20# DL [id number] Seated knee flexion AAROM stretch 5 sec X 10 Supine quad stretch with strap 30 sec X 3 Supine heelslides AAROM 5 sec X 10 Leg press (held today as he already did this at gym) Manual therapy: IASTM and cupping therapy for soft tissue and scar mobilization to Rt knee, patella mobs, PROM to Rt knee with overpressure and flexion mobilizations at end range  grade 4  07/20/22 Sci fit bike seat #9, L5 X 8 minutes for ROM Seated knee extension machine 20# DL [id number], 54# SL X 15 each side Standing knee flexion lunge stretch 5 sec X 10 Step ups leading with Rt and down in front with left on 6 inch step X 15 Leg press Rt leg only 68# slowly moving into max flexion and extension 2X10  Manual therapy: IASTM and cupping therapy for soft tissue and scar mobilization to Rt knee, patella mobs, PROM to Rt knee with overpressure and flexion mobilizations at end range grade 4  PATIENT EDUCATION: Education details: HEP, PT plan of care Person educated: Patient Education method: Explanation, Demonstration, Verbal cues, and Handouts Education comprehension: verbalized understanding and needs further education   HOME EXERCISE PROGRAM: Access Code: [email or web address] URL: [email or web address] Date: 07/13/2022 Prepared by: Vladimir Faster  Exercises - Supine Quadriceps Stretch with Strap on Table (Mirrored)  - 3 x daily - 6 x weekly - 3 reps - 30 hold - Seated Knee Flexion AAROM  - 3 x daily - 7 x weekly - 1 sets - 1 reps - 3 minutes hold - Supine Heel Slide with Strap  - 3 x daily - 6 x weekly - 1-2 sets - 10 reps - 5 hold - standing knee flexion stretch on chair or step  - 2 x daily - 6 x weekly - 1 sets - 10 reps - 10 hold - Seated Passive Knee Extension  - 2 x daily - 6 x weekly - 1 sets - 3 reps - 1-2 min hold - Supine Bridge  - 2 x daily - 6 x weekly - 3 sets - 7 reps - 5-10 seconds hold  Patient Education - Ice Massage  ASSESSMENT:  CLINICAL IMPRESSION: He has one more visit scheduled and we will update measurements and goals for discharge to independent program  OBJECTIVE IMPAIRMENTS: decreased activity tolerance, difficulty walking, decreased endurance, decreased mobility, decreased ROM, decreased strength, impaired flexibility, and pain.  ACTIVITY LIMITATIONS: bending, lifting, carry, locomotion, cleaning,  PERSONAL FACTORS: Bil TKA,  Lt ear hearing loss, h/o seizures, are also affecting patient's functional outcome.  REHAB POTENTIAL: Good  CLINICAL DECISION MAKING: Stable/uncomplicated  EVALUATION COMPLEXITY: Low    GOALS:  Long term PT goals Target date:08/03/2022   Pt will improve Rt knee ROM 2-105 degrees to improve functional mobility Baseline: Goal status: New Pt will improve  seated hip/knee strength to at least 5/5 MMT to improve functional strength Baseline: Goal status: New Pt will improve FOTO to at least % functional to show improved function Baseline: Goal status: New Pt will be able to ambulate community distances at least 1000 ft WNL gait pattern without complaints Baseline: Goal status: New  PLAN: PT FREQUENCY: 2 times per week   PT DURATION: 4 weeks  PLANNED INTERVENTIONS (unless contraindicated): aquatic PT, Canalith repositioning, cryotherapy, Electrical stimulation, Iontophoresis with 4 mg/ml dexamethasome, Moist heat, traction, Ultrasound, gait training, Therapeutic exercise, balance training, neuromuscular re-education, patient/family education, prosthetic training, manual techniques, passive ROM, dry needling, taping, vasopnuematic device, vestibular, spinal manipulations, joint manipulations  PLAN FOR NEXT SESSION: check goals and transition to independent program next visit   April Manson, PT, DPT 07/29/2022, 9:39 AM

## 2022-07-30 ENCOUNTER — Encounter: Payer: PPO | Admitting: Physical Therapy

## 2022-08-03 ENCOUNTER — Encounter: Payer: PPO | Admitting: Physical Therapy

## 2022-08-05 ENCOUNTER — Encounter: Payer: PPO | Admitting: Physical Therapy

## 2022-08-06 ENCOUNTER — Encounter: Payer: PPO | Admitting: Physical Therapy

## 2022-08-12 ENCOUNTER — Ambulatory Visit: Payer: PPO | Admitting: Physical Therapy

## 2022-08-12 ENCOUNTER — Encounter: Payer: Self-pay | Admitting: Physical Therapy

## 2022-08-12 DIAGNOSIS — R600 Localized edema: Secondary | ICD-10-CM | POA: Diagnosis not present

## 2022-08-12 DIAGNOSIS — R262 Difficulty in walking, not elsewhere classified: Secondary | ICD-10-CM | POA: Diagnosis not present

## 2022-08-12 DIAGNOSIS — M6281 Muscle weakness (generalized): Secondary | ICD-10-CM | POA: Diagnosis not present

## 2022-08-12 DIAGNOSIS — M25561 Pain in right knee: Secondary | ICD-10-CM

## 2022-08-12 DIAGNOSIS — M25661 Stiffness of right knee, not elsewhere classified: Secondary | ICD-10-CM | POA: Diagnosis not present

## 2022-08-12 NOTE — Therapy (Signed)
OUTPATIENT PHYSICAL THERAPY LOWER EXTREMITY TREATMENT/Discharge PHYSICAL THERAPY DISCHARGE SUMMARY  Visits from Start of Care:   Current functional level related to goals / functional outcomes: See below   Remaining deficits: See below   Education / Equipment: HEP  Plan:  Patient goals were met. Patient is being discharged due to meeting the PT goals      Patient Name: Ricky Barton MRN: [id number] DOB:[birth year], 67 y.o., male Today's Date: 08/12/2022  END OF SESSION:  PT End of Session - 08/12/22 1058     Visit Number 8    Number of Visits 8    Date for PT Re-Evaluation 08/03/22    Authorization Type Ortho bundle    PT Start Time 1056    PT Stop Time 1140    PT Time Calculation (min) 44 min    Activity Tolerance Patient tolerated treatment well    Behavior During Therapy [facility] for tasks assessed/performed                 Past Medical History:  Diagnosis Date   Arthritis    bilateral knees   Hypoglycemic syndrome    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   Procedure: LEFT TOTAL KNEE ARTHROPLASTY;  Surgeon: Tarry Kos, MD;  Location: [facility] OR;  Service: Orthopedics;  Laterality: Left;   TOTAL KNEE ARTHROPLASTY Right 04/03/2022   Procedure: RIGHT TOTAL KNEE ARTHROPLASTY;  Surgeon: Tarry Kos, MD;  Location: [facility] OR;  Service: Orthopedics;  Laterality: Right;   Patient Active Problem List   Diagnosis Date Noted    Status post total right knee replacement 04/03/2022   Primary osteoarthritis of right knee 02/04/2022   Primary osteoarthritis of left knee 05/12/2021   Status post total left knee replacement 05/12/2021   Allergic rhinitis 12/09/2020   Low serum vitamin B12 12/07/2020   Welcome to Medicare preventive visit 12/06/2020   Advanced directives, counseling/discussion 12/06/2020   Angular cheilitis 12/06/2020   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   Elevated blood pressure reading without diagnosis of hypertension 11/10/2018   Renal lesion 03/28/2018   Arthralgia of hands, bilateral 09/12/2015   OSA on CPAP 09/12/2015   Obesity, Class I, BMI 30-34.9 04/05/2014   Hyperglycemia 03/30/2013   Healthcare maintenance 03/04/2012   HLD (hyperlipidemia) 01/18/2009   ERECTILE DYSFUNCTION, 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   History of seizure 08/15/1991    PCP: Eustaquio Boyden, MD   REFERRING PROVIDER: Cristie Hem, PA-C  REFERRING DIAG: [id number] (ICD-10-CM) - Status post total right knee replacement   THERAPY DIAG:  Stiffness of right knee, not elsewhere classified  Right knee pain, unspecified chronicity  Muscle weakness (generalized)  Localized edema  Difficulty in walking, not elsewhere classified  Rationale for Evaluation and Treatment: Rehabilitation  ONSET DATE: 04/03/22 Rt TKA  SUBJECTIVE:  SUBJECTIVE STATEMENT: He reports he has been doing all the exercises and feels more limber, he agrees to DC today.  PERTINENT HISTORY: Bil TKA, Lt ear hearing loss, h/o seizures, PAIN:  Are you having pain? Yes: NPRS scale: NO pain  Pain location: Rt anterior knee and runs down shin Pain description: tightness Aggravating factors: bending his knee, sitting for too long Relieving factors: goes away with movement  PRECAUTIONS: None  WEIGHT BEARING RESTRICTIONS: No  FALLS:  Has patient fallen in last 6 months? No   PLOF: Independent  PATIENT GOALS: reduce stiffness and pain in  knee  NEXT MD VISIT:   OBJECTIVE:   DIAGNOSTIC FINDINGS: XR Rt knee 05/15/22 impression "Well-seated prosthesis with complication"  PATIENT SURVEYS:  Eval: FOTO 09/25/22  COGNITION: Overall cognitive status: Within functional limits for tasks assessed     SENSATION:   EDEMA:    MUSCLE LENGTH: Tightness in quads and hamstring Rt leg  POSTURE:   PALPATION: some pain around patella tendon   LOWER EXTREMITY ROM:  Active ROM/PROM Right eval Right 07/13/2022 A/P Right 07/23/22 Right 08/12/22  Hip flexion      Hip extension      Hip abduction      Hip adduction      Hip internal rotation      Hip external rotation      Knee flexion 94/100 99/104 98/105 105/107  Knee extension 3/1   2/0  Ankle dorsiflexion      Ankle plantarflexion      Ankle inversion      Ankle eversion       (Blank rows = not tested)  LOWER EXTREMITY MMT:  MMT tested in sitting Right eval Right 08/12/22  Hip flexion 4+ 5  Hip extension    Hip abduction 5   Hip adduction    Hip internal rotation    Hip external rotation    Knee flexion 5 5  Knee extension 5 5  Ankle dorsiflexion    Ankle plantarflexion    Ankle inversion    Ankle eversion     (Blank rows = not tested)  LOWER EXTREMITY SPECIAL TESTS:    FUNCTIONAL TESTS:   GAIT: Eval Comments: WFL but difficulty when first starts walking after sitting   TODAY'S TREATMENT:  08/12/22 Sci fit bike seat #9, L5 X 8 minutes for ROM Standing lunge knee flexion AAROM stretch 5 sec X 10 with foot on treadmill Standing hamstring stretch 30 sec X 3 Supine quad stretch with strap 30 sec X 3 Supine heelslides 5 sec X 10 Leg press Rt leg only 68# 2X10 holding 5 sec, then performed on left leg Updated measurements, FOTO, goals, and discusesed PT plan of care recommendations to transition to HEP.    PATIENT EDUCATION: Education details: HEP, PT plan of care Person educated: Patient Education method: Explanation, Demonstration, Verbal cues,  and Handouts Education comprehension: verbalized understanding and needs further education   HOME EXERCISE PROGRAM: Access Code: [email or web address] URL: [email or web address] Date: 07/13/2022 Prepared by: Vladimir Faster  Exercises - Supine Quadriceps Stretch with Strap on Table (Mirrored)  - 3 x daily - 6 x weekly - 3 reps - 30 hold - Seated Knee Flexion AAROM  - 3 x daily - 7 x weekly - 1 sets - 1 reps - 3 minutes hold - Supine Heel Slide with Strap  - 3 x daily - 6 x weekly - 1-2 sets - 10 reps - 5 hold - standing knee flexion stretch on chair or  step  - 2 x daily - 6 x weekly - 1 sets - 10 reps - 10 hold - Seated Passive Knee Extension  - 2 x daily - 6 x weekly - 1 sets - 3 reps - 1-2 min hold - Supine Bridge  - 2 x daily - 6 x weekly - 3 sets - 7 reps - 5-10 seconds hold  Patient Education - Ice Massage  ASSESSMENT:  CLINICAL IMPRESSION: He has been compliant with HEP and has met PT goals and will be discharged to independent program.   OBJECTIVE IMPAIRMENTS: decreased activity tolerance, difficulty walking, decreased endurance, decreased mobility, decreased ROM, decreased strength, impaired flexibility, and pain.  ACTIVITY LIMITATIONS: bending, lifting, carry, locomotion, cleaning,  PERSONAL FACTORS: Bil TKA, Lt ear hearing loss, h/o seizures, are also affecting patient's functional outcome.  REHAB POTENTIAL: Good  CLINICAL DECISION MAKING: Stable/uncomplicated  EVALUATION COMPLEXITY: Low    GOALS:  Long term PT goals Target date:08/03/2022   Pt will improve Rt knee ROM 2-105 degrees to improve functional mobility Baseline: Goal status: MET 5/29 Pt will improve  seated hip/knee strength to at least 5/5 MMT to improve functional strength Baseline: Goal status: MET 5/29 Pt will improve FOTO to at least 79% functional to show improved function Baseline: Goal status: did not quite meet, 74% Pt will be able to ambulate community distances at least 1000 ft WNL  gait pattern without complaints Baseline: Goal status: MET 5/29  PLAN: PT FREQUENCY: 2 times per week   PT DURATION: 4 weeks  PLANNED INTERVENTIONS (unless contraindicated): aquatic PT, Canalith repositioning, cryotherapy, Electrical stimulation, Iontophoresis with 4 mg/ml dexamethasome, Moist heat, traction, Ultrasound, gait training, Therapeutic exercise, balance training, neuromuscular re-education, patient/family education, prosthetic training, manual techniques, passive ROM, dry needling, taping, vasopnuematic device, vestibular, spinal manipulations, joint manipulations  PLAN FOR NEXT SESSION: DC today  April Manson, PT, DPT 08/12/2022, 12:02 PM

## 2022-09-22 DIAGNOSIS — G4733 Obstructive sleep apnea (adult) (pediatric): Secondary | ICD-10-CM | POA: Diagnosis not present

## 2022-09-25 ENCOUNTER — Encounter: Payer: Self-pay | Admitting: Physician Assistant

## 2022-09-25 ENCOUNTER — Other Ambulatory Visit (INDEPENDENT_AMBULATORY_CARE_PROVIDER_SITE_OTHER): Payer: PPO

## 2022-09-25 ENCOUNTER — Ambulatory Visit: Payer: PPO | Admitting: Physician Assistant

## 2022-09-25 DIAGNOSIS — Z96651 Presence of right artificial knee joint: Secondary | ICD-10-CM

## 2022-09-25 DIAGNOSIS — Z96652 Presence of left artificial knee joint: Secondary | ICD-10-CM

## 2022-09-25 NOTE — Progress Notes (Signed)
Post-Op Visit Note   Patient: Ricky Barton           Date of Birth: 10-21-1955           MRN: [id number] Visit Date: 09/25/2022 PCP: Eustaquio Boyden, MD   Assessment & Plan:  Chief Complaint:  Chief Complaint  Patient presents with   Right Knee - Follow-up    Right total knee arthroplasty 04/03/2022   Visit Diagnoses:  1. Status post total right knee replacement   2. Status post total left knee replacement     Plan: Patient is a pleasant 67 year old gentleman who comes in today 6 months status post right total knee replacement 04/03/2022.  He has been doing well.  He notes slight discomfort in the middle the night at times but notes that he has been sleeping with a CPAP where he is sleeping much better and in the same position so he thinks this may be due to not moving his leg as much and getting stiff.  Otherwise, doing well.  He continues to work on a home exercise program.  Of note, he is approximately 1-1/2 years status post left total knee replacement 05/12/2021.  Doing well there.  His only complaint is "clicking "which she notes primarily with stair climbing.  No pain associated with this.  Examination of both knees reveals range of motion from 0 to 125 degrees on the left and 0 to 120 degrees on the right.  He is stable to valgus varus stress both sides.  He is neurovascularly intact distally.  At this point, he will advance with activity as tolerated.  Continue with range of motion exercises.  Dental prophylaxis reinforced.  He will follow-up with [place] in 6 months for repeat evaluation and x-rays of bilateral total knee replacements.  Call with concerns or questions in the meantime.  Follow-Up Instructions: Return in about 6 months (around 03/28/2023).   Orders:  Orders Placed This Encounter  Procedures   XR Knee 1-2 Views Right   No orders of the defined types were placed in this encounter.   Imaging: XR Knee 1-2 Views Right  Result Date: 09/25/2022 Well-seated  prosthesis without complication   PMFS History: Patient Active Problem List   Diagnosis Date Noted   Status post total right knee replacement 04/03/2022   Primary osteoarthritis of right knee 02/04/2022   Primary osteoarthritis of left knee 05/12/2021   Status post total left knee replacement 05/12/2021   Allergic rhinitis 12/09/2020   Low serum vitamin B12 12/07/2020   Welcome to Medicare preventive visit 12/06/2020   Advanced directives, counseling/discussion 12/06/2020   Angular cheilitis 12/06/2020   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   Elevated blood pressure reading without diagnosis of hypertension 11/10/2018   Renal lesion 03/28/2018   Arthralgia of hands, bilateral 09/12/2015   OSA on CPAP 09/12/2015   Obesity, Class I, BMI 30-34.9 04/05/2014   Hyperglycemia 03/30/2013   Healthcare maintenance 03/04/2012   HLD (hyperlipidemia) 01/18/2009   ERECTILE DYSFUNCTION, 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   History of seizure 08/15/1991   Past Medical History:  Diagnosis Date   Arthritis    bilateral knees   Hypoglycemic syndrome    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    OSA on CPAP 09/12/2015   Severe by HST   Seizures (HCC) 1990s   isolated, none since, was on dilantin for 3 yrs, then stopped   Sleep apnea    uses CPAP nightly    Family History  Problem Relation Age of Onset   Stomach cancer Mother    Hypertension Mother    Colon polyps Mother    Cancer Father 66       stomach cancer   Hyperlipidemia Brother    CAD Brother 70       MI (smoker)   Hyperlipidemia Brother    Sleep apnea Brother    Pancreatic cancer Brother 68   Hyperlipidemia Brother    CAD Brother 5       MI (smoker)   Hyperlipidemia Brother    Multiple sclerosis Brother    Colon cancer Neg Hx    Esophageal cancer Neg Hx    Rectal cancer Neg Hx     Past Surgical History:  Procedure Laterality Date   CHOLECYSTECTOMY  1989   COLONOSCOPY  03/2011   3 adenomatous polyps, rec  rpt 5 yrs (Dr Russella Dar).   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   Procedure: LEFT TOTAL KNEE ARTHROPLASTY;  Surgeon: Tarry Kos, MD;  Location: [facility] OR;  Service: Orthopedics;  Laterality: Left;   TOTAL KNEE ARTHROPLASTY Right 04/03/2022   Procedure: RIGHT TOTAL KNEE ARTHROPLASTY;  Surgeon: Tarry Kos, MD;  Location: [facility] OR;  Service: Orthopedics;  Laterality: Right;   Social History   Occupational History   Occupation: Barista: [employer]    Comment: [employer]  Tobacco Use   Smoking status: Never   Smokeless tobacco: Never  Vaping Use   Vaping status: Never Used  Substance and Sexual Activity   Alcohol use: No   Drug use: No   Sexual activity: Yes

## 2022-10-05 DIAGNOSIS — Z6836 Body mass index (BMI) 36.0-36.9, adult: Secondary | ICD-10-CM | POA: Diagnosis not present

## 2022-10-05 DIAGNOSIS — Z20822 Contact with and (suspected) exposure to covid-19: Secondary | ICD-10-CM | POA: Diagnosis not present

## 2022-10-05 DIAGNOSIS — R051 Acute cough: Secondary | ICD-10-CM | POA: Diagnosis not present

## 2022-10-05 DIAGNOSIS — U071 COVID-19: Secondary | ICD-10-CM | POA: Diagnosis not present

## 2022-11-19 DIAGNOSIS — H2513 Age-related nuclear cataract, bilateral: Secondary | ICD-10-CM | POA: Diagnosis not present

## 2022-12-11 ENCOUNTER — Ambulatory Visit: Payer: PPO | Admitting: Primary Care

## 2022-12-11 ENCOUNTER — Encounter: Payer: Self-pay | Admitting: Primary Care

## 2022-12-11 VITALS — BP 142/90 | HR 80 | Wt 223.0 lb

## 2022-12-11 DIAGNOSIS — G4733 Obstructive sleep apnea (adult) (pediatric): Secondary | ICD-10-CM

## 2022-12-11 NOTE — Progress Notes (Unsigned)
@Patient  ID: Ricky Barton, male    DOB: May 10, 1955, 67 y.o.   MRN: [id number]  Chief Complaint  Patient presents with   follow-up    Referring provider: Eustaquio Boyden, MD  HPI: 67 year old male, never smoked. PMH significant for OSA on CPAP, allergic rhinitis, hyperlipidemia.   12/11/2022 Sleeps pretty well He is compliant with CPAP Snoring improved  He wakes up on occasion due leg pain He uses nasal mask, able to sleep comfortably on his side No significant daytime sleepiness No issues with mask fit or pressure settings    Airview download 11/10/22- 12/09/22 Usage days 30/30 days (100%) Average usage 7 hours 41 minutes Pressure 5 to 15 cm H2O (11.6 cm H2O-95%) Air leaks 6 L/min (95%) AHI 0.3    Allergies  Allergen Reactions   Piroxicam Rash    blisters    Immunization History  Administered Date(s) Administered   Fluad Quad(high Dose 65+) 12/06/2020, 02/11/2022   Influenza Split 03/04/2011, 03/04/2012   Influenza Whole 01/24/2009, 02/26/2010   Influenza,inj,Quad PF,6+ Mos 03/30/2013, 04/05/2014, 11/29/2017, 11/10/2018   PFIZER(Purple Top)SARS-COV-2 Vaccination 06/03/2019, 06/26/2019   PNEUMOCOCCAL CONJUGATE-20 12/06/2020   Td 01/18/1996, 01/24/2009   Tdap 06/07/2017, 11/18/2018   Zoster Recombinant(Shingrix) 11/29/2017, 03/07/2018    Past Medical History:  Diagnosis Date   Arthritis    bilateral knees   Hypoglycemic syndrome    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 To be taken after surgery to prevent blood clots (Patient taking differently: Take 81 mg by mouth every Monday, Wednesday, and Friday.) 84 tablet 0   cetirizine (ZYRTEC) 10 MG tablet TAKE 1 TABLET BY MOUTH EVERY DAY (Patient taking differently: Take 10 mg by mouth daily as needed for allergies.) 30 tablet 6   cyanocobalamin 1000 MCG tablet Take 1 tablet (1,000 mcg total) by mouth every Monday, Wednesday, and Friday. Vit b12     ibuprofen (ADVIL) 200 MG tablet Take 400 mg by mouth every 6 (six) hours as needed for moderate pain.     Omega-3 Fatty Acids (FISH OIL) 1000 MG CAPS Take 1 capsule (1,000 mg total) by mouth daily.  0   pravastatin (PRAVACHOL) 20 MG tablet Take 1 tablet (20 mg total) by mouth daily. 90 tablet 4   sildenafil (VIAGRA) 100 MG tablet TAKE 1/2 TO 1 TABLET BY MOUTH 1 HOUR PRIOR AS NEEDED 9 tablet 6   sodium chloride (OCEAN) 0.65 % SOLN nasal spray Place 1 spray into both nostrils at bedtime.     amoxicillin (AMOXIL) 500 MG capsule Take four pills one hour prior to dental work (Patient not taking: Reported on 12/11/2022) 8 capsule 2   aspirin EC 81 MG tablet Take 1 tablet (81 mg total) by mouth 2 (two) times daily. To be taken after surgery to prevent blood clots (Patient not taking: Reported on 12/11/2022) 84 tablet 0   cefadroxil (DURICEF) 500 MG capsule Take 1 capsule (500 mg  total) by mouth 2 (two) times daily. To be taken after surgery 20 capsule 0   docusate sodium (COLACE) 100 MG capsule Take 1 capsule (100 mg total) by mouth daily as needed. (Patient taking differently: Take 100 mg by mouth daily as needed. To be taken after surgery) 30 capsule 2   Glucosamine-Chondroitin (OSTEO BI-FLEX REGULAR STRENGTH PO) Take 2 tablets by mouth daily.     methocarbamol (ROBAXIN-750) 750 MG tablet Take 1 tablet (750 mg total) by mouth 2 (two) times daily as needed for muscle spasms. 20 tablet 2   ondansetron (ZOFRAN) 4 MG tablet Take 1 tablet (4 mg total) by mouth every 6 (six) hours. 12  tablet 0   oxyCODONE (ROXICODONE) 5 MG immediate release tablet Take 1 tablet (5 mg total) by mouth every 6 (six) hours as needed for severe pain. 7 tablet 0   tamsulosin (FLOMAX) 0.4 MG CAPS capsule Take 1 capsule (0.4 mg total) by mouth daily. 30 capsule 0   No facility-administered medications prior to visit.      Review of Systems  Review of Systems   Physical Exam  BP (!) 142/90   Pulse 80   Wt 223 lb (101.2 kg)   SpO2 96%   BMI 37.11 kg/m  Physical Exam   Lab Results:  CBC    Component Value Date/Time   WBC 17.7 (H) 07/05/2022 0821   RBC 4.85 07/05/2022 0821   HGB 14.2 07/05/2022 0821   HCT 42.4 07/05/2022 0821   PLT 307 07/05/2022 0821   MCV 87.4 07/05/2022 0821   MCH 29.3 07/05/2022 0821   MCHC 33.5 07/05/2022 0821   RDW 13.2 07/05/2022 0821   LYMPHSABS 1.7 07/05/2022 0821   MONOABS 1.0 07/05/2022 0821   EOSABS 0.0 07/05/2022 0821   BASOSABS 0.0 07/05/2022 0821    BMET    Component Value Date/Time   NA 138 07/05/2022 0821   K 3.8 07/05/2022 0821   CL 106 07/05/2022 0821   CO2 21 (L) 07/05/2022 0821   GLUCOSE 126 (H) 07/05/2022 0821   BUN 16 07/05/2022 0821   CREATININE 1.01 07/05/2022 0821   CALCIUM 9.2 07/05/2022 0821   GFRNONAA >60 07/05/2022 0821   GFRAA >60 03/06/2019 1709    BNP No results found for: "BNP"  ProBNP No results found for: "PROBNP"  Imaging: No results found.   Assessment & Plan:   No problem-specific Assessment & Plan notes found for this encounter.     Glenford Bayley, NP 12/11/2022

## 2022-12-11 NOTE — Patient Instructions (Signed)
Your sleep apnea is well controlled on CPAP currently No changes recommended today   Recommendations: Bring CPAP by [employer] if continues to make noise (you can get replacement machine every 5 years) Continue to wear CPAP nightly 4-6 hours or longer Continue to focus on weight loss efforts and side sleeping position Don't drive if tired  Orders: None   Follow-up 1 year with Dr. Belia Heman or [facility] NP/ call sooner if needed   CPAP and BIPAP Information CPAP and BIPAP are methods that use air pressure to keep your airways open and to help you breathe well. CPAP and BIPAP use different amounts of pressure. Your health care provider will tell you whether CPAP or BIPAP would be more helpful for you. CPAP stands for "continuous positive airway pressure." With CPAP, the amount of pressure stays the same while you breathe in (inhale) and out (exhale). BIPAP stands for "bi-level positive airway pressure." With BIPAP, the amount of pressure will be higher when you inhale and lower when you exhale. This allows you to take larger breaths. CPAP or BIPAP may be used in the hospital, or your health care provider may want you to use it at home. You may need to have a sleep study before your health care provider can order a machine for you to use at home. What are the advantages? CPAP or BIPAP can be helpful if you have: Sleep apnea. Chronic obstructive pulmonary disease (COPD). Heart failure. Medical conditions that cause muscle weakness, including muscular dystrophy or amyotrophic lateral sclerosis (ALS). Other problems that cause breathing to be shallow, weak, abnormal, or difficult. CPAP and BIPAP are most commonly used for obstructive sleep apnea (OSA) to keep the airways from collapsing when the muscles relax during sleep. What are the risks? Generally, this is a safe treatment. However, problems may occur, including: Irritated skin or skin sores if the mask does not fit properly. Dry or  stuffy nose or nosebleeds. Dry mouth. Feeling gassy or bloated. Sinus or lung infection if the equipment is not cleaned properly. When should CPAP or BIPAP be used? In most cases, the mask only needs to be worn during sleep. Generally, the mask needs to be worn throughout the night and during any daytime naps. People with certain medical conditions may also need to wear the mask at other times, such as when they are awake. Follow instructions from your health care provider about when to use the machine. What happens during CPAP or BIPAP?  Both CPAP and BIPAP are provided by a small machine with a flexible plastic tube that attaches to a plastic mask that you wear. Air is blown through the mask into your nose or mouth. The amount of pressure that is used to blow the air can be adjusted on the machine. Your health care provider will set the pressure setting and help you find the best mask for you. Tips for using the mask Because the mask needs to be snug, some people feel trapped or closed-in (claustrophobic) when first using the mask. If you feel this way, you may need to get used to the mask. One way to do this is to hold the mask loosely over your nose or mouth and then gradually apply the mask more snugly. You can also gradually increase the amount of time that you use the mask. Masks are available in various types and sizes. If your mask does not fit well, talk with your health care provider about getting a different one. Some common types  of masks include: Full face masks, which fit over the mouth and nose. Nasal masks, which fit over the nose. Nasal pillow or prong masks, which fit into the nostrils. If you are using a mask that fits over your nose and you tend to breathe through your mouth, a chin strap may be applied to help keep your mouth closed. Use a skin barrier to protect your skin as told by your health care provider. Some CPAP and BIPAP machines have alarms that may sound if the mask  comes off or develops a leak. If you have trouble with the mask, it is very important that you talk with your health care provider about finding a way to make the mask easier to tolerate. Do not stop using the mask. There could be a negative impact on your health if you stop using the mask. Tips for using the machine Place your CPAP or BIPAP machine on a secure table or stand near an electrical outlet. Know where the on/off switch is on the machine. Follow instructions from your health care provider about how to set the pressure on your machine and when you should use it. Do not eat or drink while the CPAP or BIPAP machine is on. Food or fluids could get pushed into your lungs by the pressure of the CPAP or BIPAP. For home use, CPAP and BIPAP machines can be rented or purchased through home health care companies. Many different brands of machines are available. Renting a machine before purchasing may help you find out which particular machine works well for you. Your health insurance company may also decide which machine you may get. Keep the CPAP or BIPAP machine and attachments clean. Ask your health care provider for specific instructions. Check the humidifier if you have a dry stuffy nose or nosebleeds. Make sure it is working correctly. Follow these instructions at home: Take over-the-counter and prescription medicines only as told by your health care provider. Ask if you can take sinus medicine if your sinuses are blocked. Do not use any products that contain nicotine or tobacco. These products include cigarettes, chewing tobacco, and vaping devices, such as e-cigarettes. If you need help quitting, ask your health care provider. Keep all follow-up visits. This is important. Contact a health care provider if: You have redness or pressure sores on your head, face, mouth, or nose from the mask or head gear. You have trouble using the CPAP or BIPAP machine. You cannot tolerate wearing the CPAP or  BIPAP mask. Someone tells you that you snore even when wearing your CPAP or BIPAP. Get help right away if: You have trouble breathing. You feel confused. Summary CPAP and BIPAP are methods that use air pressure to keep your airways open and to help you breathe well. If you have trouble with the mask, it is very important that you talk with your health care provider about finding a way to make the mask easier to tolerate. Do not stop using the mask. There could be a negative impact to your health if you stop using the mask. Follow instructions from your health care provider about when to use the machine. This information is not intended to replace advice given to you by your health care provider. Make sure you discuss any questions you have with your health care provider. Document Revised: 10/09/2020 Document Reviewed: 02/09/2020 Elsevier Patient Education  2023 [employer].

## 2022-12-13 NOTE — Assessment & Plan Note (Signed)
-   Sleep apnea is well-controlled on auto CPAP.  He is 100% compliant with usage greater than 4 hours over the last 30 days.  Current pressure 5 to 15 cm H2O; AHI 0.3/h.  No residual daytime sleepiness.  Continue to encourage nightly use.  Follow-up in 1 year or sooner if needed.

## 2023-01-02 DIAGNOSIS — G4733 Obstructive sleep apnea (adult) (pediatric): Secondary | ICD-10-CM | POA: Diagnosis not present

## 2023-01-12 DIAGNOSIS — H25811 Combined forms of age-related cataract, right eye: Secondary | ICD-10-CM | POA: Diagnosis not present

## 2023-01-12 DIAGNOSIS — Z961 Presence of intraocular lens: Secondary | ICD-10-CM | POA: Diagnosis not present

## 2023-01-12 DIAGNOSIS — H2511 Age-related nuclear cataract, right eye: Secondary | ICD-10-CM | POA: Diagnosis not present

## 2023-01-26 DIAGNOSIS — Z961 Presence of intraocular lens: Secondary | ICD-10-CM | POA: Diagnosis not present

## 2023-01-26 DIAGNOSIS — H25812 Combined forms of age-related cataract, left eye: Secondary | ICD-10-CM | POA: Diagnosis not present

## 2023-01-26 DIAGNOSIS — H2512 Age-related nuclear cataract, left eye: Secondary | ICD-10-CM | POA: Diagnosis not present

## 2023-02-02 ENCOUNTER — Ambulatory Visit (INDEPENDENT_AMBULATORY_CARE_PROVIDER_SITE_OTHER): Payer: PPO

## 2023-02-02 VITALS — Ht 65.5 in | Wt 223.0 lb

## 2023-02-02 DIAGNOSIS — Z0000 Encounter for general adult medical examination without abnormal findings: Secondary | ICD-10-CM

## 2023-02-02 NOTE — Progress Notes (Signed)
Subjective:   Ricky Barton is a 67 y.o. male who presents for Medicare Annual/Subsequent preventive examination.  Visit Complete: Virtual I connected with  [name] on 02/02/23 by a audio enabled telemedicine application and verified that I am speaking with the correct person using two identifiers.  Patient Location: Home  Provider Location: Office/Clinic  I discussed the limitations of evaluation and management by telemedicine. The patient expressed understanding and agreed to proceed.  Vital Signs: Because this visit was a virtual/telehealth visit, some criteria may be missing or patient reported. Any vitals not documented were not able to be obtained and vitals that have been documented are patient reported.  Patient Medicare AWV questionnaire was completed by the patient on (not done); I have confirmed that all information answered by patient is correct and no changes since this date. Cardiac Risk Factors include: advanced age (>23men, >75 women);dyslipidemia;male gender;obesity (BMI >30kg/m2)    Objective:    Today's Vitals   02/02/23 0819  Weight: 223 lb (101.2 kg)  Height: 5' 5.5" (1.664 m)   Body mass index is 36.54 kg/m.     02/02/2023    8:30 AM 07/06/2022    3:06 PM 07/05/2022    8:21 AM 04/17/2022    4:06 PM 03/27/2022    9:11 AM 01/05/2022    9:06 AM 05/27/2021    2:31 PM  Advanced Directives  Does Patient Have a Medical Advance Directive? No No No No No No No  Would patient like information on creating a medical advance directive?  No - Patient declined  No - Patient declined No - Patient declined No - Patient declined No - Patient declined    Current Medications (verified) Outpatient Encounter Medications as of 02/02/2023  Medication Sig   aspirin EC 81 MG tablet Take 1 tablet (81 mg total) by mouth 2 (two) times daily. To be taken after surgery to prevent blood clots (Patient taking differently: Take 81 mg by mouth every Monday, Wednesday, and  Friday.)   cetirizine (ZYRTEC) 10 MG tablet TAKE 1 TABLET BY MOUTH EVERY DAY (Patient taking differently: Take 10 mg by mouth daily as needed for allergies.)   cyanocobalamin 1000 MCG tablet Take 1 tablet (1,000 mcg total) by mouth every Monday, Wednesday, and Friday. Vit b12   ibuprofen (ADVIL) 200 MG tablet Take 400 mg by mouth every 6 (six) hours as needed for moderate pain.   Omega-3 Fatty Acids (FISH OIL) 1000 MG CAPS Take 1 capsule (1,000 mg total) by mouth daily.   pravastatin (PRAVACHOL) 20 MG tablet Take 1 tablet (20 mg total) by mouth daily.   sildenafil (VIAGRA) 100 MG tablet TAKE 1/2 TO 1 TABLET BY MOUTH 1 HOUR PRIOR AS NEEDED   sodium chloride (OCEAN) 0.65 % SOLN nasal spray Place 1 spray into both nostrils at bedtime.   amoxicillin (AMOXIL) 500 MG capsule Take four pills one hour prior to dental work (Patient not taking: Reported on 12/11/2022)   No facility-administered encounter medications on file as of 02/02/2023.    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   Procedure: LEFT TOTAL KNEE ARTHROPLASTY;  Surgeon: Tarry Kos, MD;  Location: [facility] OR;  Service: Orthopedics;  Laterality: Left;   TOTAL KNEE ARTHROPLASTY Right 04/03/2022   Procedure: RIGHT TOTAL KNEE ARTHROPLASTY;  Surgeon: Tarry Kos,  MD;  Location: [facility] OR;  Service: Orthopedics;  Laterality: Right;   Family History  Problem Relation Age of Onset   Stomach cancer Mother    Hypertension Mother    Colon polyps Mother    Cancer Father 21       stomach cancer   Hyperlipidemia Brother    CAD Brother 51       MI (smoker)   Hyperlipidemia Brother    Sleep apnea Brother    Pancreatic cancer Brother 53   Hyperlipidemia Brother    CAD Brother 27       MI (smoker)   Hyperlipidemia Brother    Multiple sclerosis Brother    Colon cancer Neg Hx    Esophageal cancer Neg Hx    Rectal cancer Neg Hx    Social History   Socioeconomic History   Marital status: Married    Spouse name: Not on file   Number of children: 2   Years of education: Not on file   Highest education level: Not on file  Occupational History   Occupation: Route Teacher, adult education: [employer]    Comment: [employer]  Tobacco Use   Smoking status: Never   Smokeless tobacco: Never  Vaping Use   Vaping status: Never Used  Substance and Sexual Activity   Alcohol use: No   Drug use: No   Sexual activity: Yes  Other Topics Concern   Not on file  Social History Narrative   Caffeine: occasional coffee/tea   Married and lives with wife, grown children, 2 dogs   Occupation: [employer]   Activity: no regular exercise, some golfing   Diet: good water, fruits/vegetables daily   Social Determinants of Health   Financial Resource Strain: Low Risk  (02/02/2023)   Overall Financial Resource Strain (CARDIA)    Difficulty of Paying Living Expenses: Not hard at all  Food Insecurity: No Food Insecurity (02/02/2023)   Hunger Vital Sign    Worried About Running Out of Food in the Last Year: Never true    Ran Out of Food in the Last Year: Never true  Transportation Needs: No Transportation Needs (02/02/2023)   PRAPARE - Administrator, Civil Service (Medical): No    Lack of Transportation (Non-Medical): No  Physical Activity:  Sufficiently Active (02/02/2023)   Exercise Vital Sign    Days of Exercise per Week: 4 days    Minutes of Exercise per Session: 50 min  Stress: No Stress Concern Present (02/02/2023)   [employer] of Occupational Health - Occupational Stress Questionnaire    Feeling of Stress : Not at all  Social Connections: Socially Integrated (02/02/2023)   Social Connection and Isolation Panel [NHANES]    Frequency of Communication with Friends and Family: More than three times a  week    Frequency of Social Gatherings with Friends and Family: Twice a week    Attends Religious Services: More than 4 times per year    Active Member of [employer] or Organizations: Yes    Attends Engineer, structural: More than 4 times per year    Marital Status: Married    Tobacco Counseling Counseling given: Not Answered   Clinical Intake:  Pre-visit preparation completed: Yes  Pain : No/denies pain   BMI - recorded: 36.54 Nutritional Status: BMI > 30  Obese Nutritional Risks: None Diabetes: No  How often do you need to have someone help you when you read instructions, pamphlets, or other written materials from your doctor or pharmacy?: 1 - Never  Interpreter Needed?: No  Comments: lives with wife Information entered by :: B.Akoni Parton,LPN   Activities of Daily Living    02/02/2023    8:30 AM 03/27/2022    9:12 AM  In your present state of health, do you have any difficulty performing the following activities:  Hearing? 1   Vision? 0   Difficulty concentrating or making decisions? 0   Walking or climbing stairs? 0   Dressing or bathing? 0   Doing errands, shopping? 0 0  Preparing Food and eating ? N   Using the Toilet? N   In the past six months, have you accidently leaked urine? N   Do you have problems with loss of bowel control? N   Managing your Medications? N   Managing your Finances? N   Housekeeping or managing your Housekeeping? N     Patient Care Team: Eustaquio Boyden,  MD as PCP - General (Family Medicine) Antony Contras, MD as Consulting Physician (Ophthalmology)  Indicate any recent Medical Services you may have received from other than [facility] providers in the past year (date may be approximate).     Assessment:   This is a routine wellness examination for Ricky Barton.  Hearing/Vision screen Hearing Screening - Comments:: Pt says his hearing is ok..deaf in left ear all his life;rt is good Vision Screening - Comments:: Pt says vision is good;only readers Cataract surgery both eyes this month Dr Randon Goldsmith   Goals Addressed             This Visit's Progress    Patient Stated       Stay healthy and active       Depression Screen    02/02/2023    8:28 AM 02/11/2022    4:21 PM 01/05/2022    9:13 AM 12/06/2020   10:59 AM 11/14/2019   10:36 AM 11/10/2018   12:11 PM 10/19/2017   10:48 AM  PHQ 2/9 Scores  PHQ - 2 Score 0 0 0 0 0 0 0  PHQ- 9 Score   0        Fall Risk    02/02/2023    8:24 AM 01/05/2022    9:06 AM 12/06/2020   10:59 AM  Fall Risk   Falls in the past year? 0 0 0  Number falls in past yr: 0 0   Injury with Fall? 0 0   Risk for fall due to : No Fall Risks    Follow up Education provided;Falls prevention discussed Falls evaluation completed;Education provided;Falls prevention discussed     MEDICARE RISK AT HOME: Medicare Risk at Home Any stairs in or around the home?: Yes If so, are there any without handrails?: Yes Home free of loose throw rugs in walkways, pet beds, electrical cords,  etc?: Yes Adequate lighting in your home to reduce risk of falls?: Yes Life alert?: No Use of a cane, walker or w/c?: No Grab bars in the bathroom?: No Shower chair or bench in shower?: Yes Elevated toilet seat or a handicapped toilet?: Yes  TIMED UP AND GO:  Was the test performed?  No    Cognitive Function:        02/02/2023    8:32 AM 01/05/2022    9:08 AM  6CIT Screen  What Year? 0 points 0 points  What month? 0 points 0 points   What time? 0 points 0 points  Count back from 20 0 points 0 points  Months in reverse 0 points 0 points  Repeat phrase 0 points 0 points  Total Score 0 points 0 points    Immunizations Immunization History  Administered Date(s) Administered   Fluad Quad(high Dose 65+) 12/06/2020, 02/11/2022   Influenza Split 03/04/2011, 03/04/2012   Influenza Whole 01/24/2009, 02/26/2010   Influenza,inj,Quad PF,6+ Mos 03/30/2013, 04/05/2014, 11/29/2017, 11/10/2018   PFIZER(Purple Top)SARS-COV-2 Vaccination 06/03/2019, 06/26/2019   PNEUMOCOCCAL CONJUGATE-20 12/06/2020   Td 01/18/1996, 01/24/2009   Tdap 06/07/2017, 11/18/2018   Zoster Recombinant(Shingrix) 11/29/2017, 03/07/2018    TDAP status: Up to date  Flu Vaccine status: Due, Education has been provided regarding the importance of this vaccine. Advised may receive this vaccine at local pharmacy or Health Dept. Aware to provide a copy of the vaccination record if obtained from local pharmacy or Health Dept. Verbalized acceptance and understanding.  Pneumococcal vaccine status: Up to date  Covid-19 vaccine status: Completed vaccines  Qualifies for Shingles Vaccine? Yes   Zostavax completed Yes   Shingrix Completed?: Yes  Screening Tests Health Maintenance  Topic Date Due   COVID-19 Vaccine (3 - 2023-24 season) 03/16/2023 (Originally 11/15/2022)   INFLUENZA VACCINE  06/14/2023 (Originally 10/15/2022)   Medicare Annual Wellness (AWV)  02/02/2024   Colonoscopy  11/19/2026   DTaP/Tdap/Td (5 - Td or Tdap) 11/17/2028   Pneumonia Vaccine 16+ Years old  Completed   Hepatitis C Screening  Completed   Zoster Vaccines- Shingrix  Completed   HPV VACCINES  Aged Out    Health Maintenance  There are no preventive care reminders to display for this patient.   Colorectal cancer screening: Type of screening: Colonoscopy. Completed 11/18/21. Repeat every 5 years  Lung Cancer Screening: (Low Dose CT Chest recommended if Age 67-80 years, 20 pack-year  currently smoking OR have quit w/in 15years.) does not qualify.   Lung Cancer Screening Referral: no  Additional Screening:  Hepatitis C Screening: does not qualify; Completed 08/30/2015  Vision Screening: Recommended annual ophthalmology exams for early detection of glaucoma and other disorders of the eye. Is the patient up to date with their annual eye exam?  Yes  Who is the provider or what is the name of the office in which the patient attends annual eye exams? Dr Randon Goldsmith If pt is not established with a provider, would they like to be referred to a provider to establish care? No .   Dental Screening: Recommended annual dental exams for proper oral hygiene  Diabetic Foot Exam: n/a  Community Resource Referral / Chronic Care Management: CRR required this visit?  No   CCM required this visit?  No     Plan:     I have personally reviewed and noted the following in the patient's chart:   Medical and social history Use of alcohol, tobacco or illicit drugs  Current medications and supplements including  opioid prescriptions. Patient is not currently taking opioid prescriptions. Functional ability and status Nutritional status Physical activity Advanced directives List of other physicians Hospitalizations, surgeries, and ER visits in previous 12 months Vitals Screenings to include cognitive, depression, and falls Referrals and appointments  In addition, I have reviewed and discussed with patient certain preventive protocols, quality metrics, and best practice recommendations. A written personalized care plan for preventive services as well as general preventive health recommendations were provided to patient.    Sue Lush, LPN   [id number]   After Visit Summary: (MyChart) Due to this being a telephonic visit, the after visit summary with patients personalized plan was offered to patient via MyChart   Nurse Notes: The patient states he is  doing well and has no  concerns or questions at this time.

## 2023-02-02 NOTE — Patient Instructions (Signed)
Ricky Barton , Thank you for taking time to come for your Medicare Wellness Visit. I appreciate your ongoing commitment to your health goals. Please review the following plan we discussed and let me know if I can assist you in the future.   Referrals/Orders/Follow-Ups/Clinician Recommendations: none  This is a list of the screening recommended for you and due dates:  Health Maintenance  Topic Date Due   COVID-19 Vaccine (3 - 2023-24 season) 03/16/2023*   Flu Shot  06/14/2023*   Medicare Annual Wellness Visit  02/02/2024   Colon Cancer Screening  11/19/2026   DTaP/Tdap/Td vaccine (5 - Td or Tdap) 11/17/2028   Pneumonia Vaccine  Completed   Hepatitis C Screening  Completed   Zoster (Shingles) Vaccine  Completed   HPV Vaccine  Aged Out  *Topic was postponed. The date shown is not the original due date.    Advanced directives: (ACP Link)Information on Advanced Care Planning can be found at [facility] Directives Advance Health Care Directives ([email or web address]   Next Medicare Annual Wellness Visit scheduled for next year: Yes 02/03/2024 @ 8:10am telephone

## 2023-02-07 ENCOUNTER — Other Ambulatory Visit: Payer: Self-pay | Admitting: Family Medicine

## 2023-02-07 DIAGNOSIS — E785 Hyperlipidemia, unspecified: Secondary | ICD-10-CM

## 2023-02-07 DIAGNOSIS — Z125 Encounter for screening for malignant neoplasm of prostate: Secondary | ICD-10-CM

## 2023-02-07 DIAGNOSIS — E538 Deficiency of other specified B group vitamins: Secondary | ICD-10-CM

## 2023-02-07 DIAGNOSIS — R739 Hyperglycemia, unspecified: Secondary | ICD-10-CM

## 2023-02-09 ENCOUNTER — Other Ambulatory Visit (INDEPENDENT_AMBULATORY_CARE_PROVIDER_SITE_OTHER): Payer: PPO

## 2023-02-09 DIAGNOSIS — E538 Deficiency of other specified B group vitamins: Secondary | ICD-10-CM | POA: Diagnosis not present

## 2023-02-09 DIAGNOSIS — E785 Hyperlipidemia, unspecified: Secondary | ICD-10-CM

## 2023-02-09 DIAGNOSIS — R739 Hyperglycemia, unspecified: Secondary | ICD-10-CM | POA: Diagnosis not present

## 2023-02-09 DIAGNOSIS — Z125 Encounter for screening for malignant neoplasm of prostate: Secondary | ICD-10-CM | POA: Diagnosis not present

## 2023-02-09 LAB — LIPID PANEL
Cholesterol: 152 mg/dL (ref 0–200)
HDL: 44.4 mg/dL (ref >39.00)
LDL Cholesterol: 78 mg/dL (ref 0–99)
NonHDL: 107.36
Total CHOL/HDL Ratio: 3
Triglycerides: 148 mg/dL (ref 0.0–149.0)
VLDL: 29.6 mg/dL (ref 0.0–40.0)

## 2023-02-09 LAB — COMPREHENSIVE METABOLIC PANEL
ALT: 15 U/L (ref 0–53)
AST: 19 U/L (ref 0–37)
Albumin: 4.1 g/dL (ref 3.5–5.2)
Alkaline Phosphatase: 83 U/L (ref 39–117)
BUN: 13 mg/dL (ref 6–23)
CO2: 28 meq/L (ref 19–32)
Calcium: 9.2 mg/dL (ref 8.4–10.5)
Chloride: 103 meq/L (ref 96–112)
Creatinine, Ser: 0.8 mg/dL (ref 0.40–1.50)
GFR: 91.54 mL/min (ref >60.00)
Glucose, Bld: 100 mg/dL — ABNORMAL HIGH (ref 70–99)
Potassium: 4.4 meq/L (ref 3.5–5.1)
Sodium: 140 meq/L (ref 135–145)
Total Bilirubin: 0.5 mg/dL (ref 0.2–1.2)
Total Protein: 6.9 g/dL (ref 6.0–8.3)

## 2023-02-09 LAB — CBC WITH DIFFERENTIAL/PLATELET
Basophils Absolute: 0 10*3/uL (ref 0.0–0.1)
Basophils Relative: 0.5 % (ref 0.0–3.0)
Eosinophils Absolute: 0.4 10*3/uL (ref 0.0–0.7)
Eosinophils Relative: 5.2 % — ABNORMAL HIGH (ref 0.0–5.0)
HCT: 43.7 % (ref 39.0–52.0)
Hemoglobin: 14.4 g/dL (ref 13.0–17.0)
Lymphocytes Relative: 26.1 % (ref 12.0–46.0)
Lymphs Abs: 2.1 10*3/uL (ref 0.7–4.0)
MCHC: 33 g/dL (ref 30.0–36.0)
MCV: 91.6 fL (ref 78.0–100.0)
Monocytes Absolute: 0.5 10*3/uL (ref 0.1–1.0)
Monocytes Relative: 6.4 % (ref 3.0–12.0)
Neutro Abs: 5.1 10*3/uL (ref 1.4–7.7)
Neutrophils Relative %: 61.8 % (ref 43.0–77.0)
Platelets: 316 10*3/uL (ref 150.0–400.0)
RBC: 4.77 Mil/uL (ref 4.22–5.81)
RDW: 13.7 % (ref 11.5–15.5)
WBC: 8.2 10*3/uL (ref 4.0–10.5)

## 2023-02-09 LAB — PSA: PSA: 0.46 ng/mL (ref 0.10–4.00)

## 2023-02-09 LAB — HEMOGLOBIN A1C: Hgb A1c MFr Bld: 5.4 % (ref 4.6–6.5)

## 2023-02-09 LAB — VITAMIN B12: Vitamin B-12: 744 pg/mL (ref 211–911)

## 2023-02-16 ENCOUNTER — Ambulatory Visit (INDEPENDENT_AMBULATORY_CARE_PROVIDER_SITE_OTHER): Payer: PPO | Admitting: Family Medicine

## 2023-02-16 ENCOUNTER — Encounter: Payer: Self-pay | Admitting: Family Medicine

## 2023-02-16 VITALS — BP 168/98 | HR 87 | Temp 98.7°F | Ht 65.5 in | Wt 223.0 lb

## 2023-02-16 DIAGNOSIS — G4733 Obstructive sleep apnea (adult) (pediatric): Secondary | ICD-10-CM

## 2023-02-16 DIAGNOSIS — R739 Hyperglycemia, unspecified: Secondary | ICD-10-CM | POA: Diagnosis not present

## 2023-02-16 DIAGNOSIS — K130 Diseases of lips: Secondary | ICD-10-CM | POA: Diagnosis not present

## 2023-02-16 DIAGNOSIS — E782 Mixed hyperlipidemia: Secondary | ICD-10-CM | POA: Diagnosis not present

## 2023-02-16 DIAGNOSIS — Z23 Encounter for immunization: Secondary | ICD-10-CM

## 2023-02-16 DIAGNOSIS — Z7189 Other specified counseling: Secondary | ICD-10-CM

## 2023-02-16 DIAGNOSIS — E66811 Obesity, class 1: Secondary | ICD-10-CM | POA: Diagnosis not present

## 2023-02-16 DIAGNOSIS — I10 Essential (primary) hypertension: Secondary | ICD-10-CM | POA: Diagnosis not present

## 2023-02-16 DIAGNOSIS — Z0000 Encounter for general adult medical examination without abnormal findings: Secondary | ICD-10-CM

## 2023-02-16 DIAGNOSIS — E538 Deficiency of other specified B group vitamins: Secondary | ICD-10-CM | POA: Diagnosis not present

## 2023-02-16 MED ORDER — ASPIRIN 81 MG PO TBEC: 81.0000 mg | DELAYED_RELEASE_TABLET | ORAL | Status: AC

## 2023-02-16 MED ORDER — PRAVASTATIN SODIUM 20 MG PO TABS: 20.0000 mg | ORAL_TABLET | Freq: Every day | ORAL | 4 refills | Status: DC

## 2023-02-16 MED ORDER — AMLODIPINE BESYLATE 5 MG PO TABS: 5.0000 mg | ORAL_TABLET | Freq: Every day | ORAL | 6 refills | Status: DC

## 2023-02-16 NOTE — Assessment & Plan Note (Addendum)
Advanced directive planning - does not have set up. Advanced directive packet previously provided. Would want wife to be HCPOA.

## 2023-02-16 NOTE — Assessment & Plan Note (Signed)
Appreciate pulmonology care. He continues auto CPAP.

## 2023-02-16 NOTE — Assessment & Plan Note (Signed)
Stable period on oral B12 replacement Monday Wednesday and Friday

## 2023-02-16 NOTE — Patient Instructions (Addendum)
Flu shot today  BP was too high today - goal <140/90 Buy blood pressure cuff to start monitoring at home. BP log sheet provided today.  Work on low salt/sodium diet - goal <2 grams (2,000mg ) per day. Eat a diet high in fruits/vegetables and whole grains.  Look into mediterranean and DASH diet. Goal activity is 174min/wk of moderate intensity exercise.  This can be split into 30 minute chunks.  If you are not at this level, you can start with smaller 10-15 min increments and slowly build up activity. Look at www.heart.org for more resources  I do recommend starting amlodipine 5mg  daily in the morning. Return in 6 weeks for blood pressure follow up visit.

## 2023-02-16 NOTE — Assessment & Plan Note (Signed)
Preventative protocols reviewed and updated unless pt declined. Discussed healthy diet and lifestyle.  

## 2023-02-16 NOTE — Assessment & Plan Note (Signed)
PRN neosporin use.  Suggested trial lotrimin.

## 2023-02-16 NOTE — Assessment & Plan Note (Signed)
Encouraged healthy diet and lifestyle choices to affect sustainable weight loss.  ?

## 2023-02-16 NOTE — Assessment & Plan Note (Signed)
Chronic, stable on current regimen-continue Prevacid. The 10-year ASCVD risk score (Arnett DK, et al., 2019) is: 23.8%   Values used to calculate the score:     Age: 67 years     Sex: Male     Is Non-Hispanic African American: No     Diabetic: No     Tobacco smoker: No     Systolic Blood Pressure: 168 mmHg     Is BP treated: Yes     HDL Cholesterol: 44.4 mg/dL     Total Cholesterol: 152 mg/dL

## 2023-02-16 NOTE — Assessment & Plan Note (Signed)
A1c remains in normal range.

## 2023-02-16 NOTE — Progress Notes (Signed)
Ph: [phone] Fax: [phone]   Patient ID: Ricky Barton, male    DOB: [birth year], 67 y.o.   MRN: [id number]  This visit was conducted in person.  BP (!) 168/98 (BP Location: Right Arm, Cuff Size: Large)   Pulse 87   Temp 98.7 F (37.1 C) (Oral)   Ht 5' 5.5" (1.664 m)   Wt 223 lb (101.2 kg)   SpO2 95%   BMI 36.54 kg/m   BP Readings from Last 3 Encounters:  02/16/23 (!) 168/98  12/11/22 (!) 142/90  07/05/22 (!) 161/110    CC: CPE Subjective:   HPI: Ricky Barton is a 67 y.o. male presenting on 02/16/2023 for Annual Exam (MCR prt 2 [AWV- 02/02/23].)   Saw health advisor last month for medicare wellness visit. Note reviewed.   No results found.  Flowsheet Row Clinical Support from 02/02/2023 in [facility] [place]  PHQ-2 Total Score 0          02/02/2023    8:24 AM 01/05/2022    9:06 AM 12/06/2020   10:59 AM  Fall Risk   Falls in the past year? 0 0 0  Number falls in past yr: 0 0   Injury with Fall? 0 0   Risk for fall due to : No Fall Risks    Follow up Education provided;Falls prevention discussed Falls evaluation completed;Education provided;Falls prevention discussed     ER visit for kidney stone 06/2022.  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    Preventative: Colonoscopy 11/2021 - 2 TAs, int hem,rpt 7 yrs Russella Dar) Prostate cancer screening - continue PSA yearly  Lung cancer screening - not eligible  AAA screen - not eligible  Flu shot yearly  Prevnar-20 11/2020 Td 2010, Tdap 05/2017  COVID vaccine - Pfizer 05/2019, 06/2019  Shingrix - 11/2017, 02/2018  Advanced directive planning - does not have set up. Advanced directive packet previously provided. Would want wife to be HCPOA.  Seat belt use discussed Sunscreen use discussed.  No changing moles on skin. Sleep - averaging 7 hours/night Non smoker Alcohol - none Dentist - Q17mo  Eye exam - yearly  - recent bilateral cataract surgery  Bowel - no constipation  Bladder - no incontinence   Caffeine: occasional coffee/tea   Married and lives with wife, grown children, dogs  Occupation: [employer]  Activity: golfing, walks dogs at least 1 mile, joined [employer] - likes swimming  Diet: good water, fruits/vegetables daily      Relevant past medical, surgical, family and social history reviewed and updated as indicated. Interim medical history since our last visit reviewed. Allergies and medications reviewed and updated. Outpatient Medications Prior to Visit  Medication Sig Dispense Refill   amoxicillin (AMOXIL) 500 MG capsule Take four pills one hour prior to dental work 8 capsule 2   cetirizine (ZYRTEC) 10 MG tablet TAKE 1 TABLET BY MOUTH EVERY DAY (Patient taking differently: Take 10 mg by mouth daily as needed for allergies.) 30 tablet 6   cyanocobalamin 1000 MCG tablet Take 1 tablet (1,000 mcg total) by mouth every Monday, Wednesday, and Friday. Vit b12     ibuprofen (ADVIL) 200 MG tablet Take 400 mg by mouth every 6 (six) hours as needed for moderate pain.     sildenafil (VIAGRA) 100 MG tablet TAKE 1/2 TO 1 TABLET BY MOUTH 1 HOUR PRIOR AS NEEDED 9 tablet  6   sodium chloride (OCEAN) 0.65 % SOLN nasal spray Place 1 spray into both nostrils at bedtime.     aspirin EC 81 MG tablet Take 1 tablet (81 mg total) by mouth 2 (two) times daily. To be taken after surgery to prevent blood clots (Patient taking differently: Take 81 mg by mouth every Monday, Wednesday, and Friday.) 84 tablet 0   Omega-3 Fatty Acids (FISH OIL) 1000 MG CAPS Take 1 capsule (1,000 mg total) by mouth daily.  0   pravastatin (PRAVACHOL) 20 MG tablet Take 1 tablet (20 mg total) by mouth daily. 90 tablet 4   No facility-administered medications prior to visit.     Per HPI unless specifically indicated in ROS section below Review of Systems  Constitutional:  Negative for activity change, appetite change, chills,  fatigue, fever and unexpected weight change.  HENT:  Negative for hearing loss.   Eyes:  Negative for visual disturbance.  Respiratory:  Negative for cough, chest tightness, shortness of breath and wheezing.   Cardiovascular:  Negative for chest pain, palpitations and leg swelling.  Gastrointestinal:  Negative for abdominal distention, abdominal pain, blood in stool, constipation, diarrhea, nausea and vomiting.  Genitourinary:  Negative for difficulty urinating and hematuria.  Musculoskeletal:  Negative for arthralgias, myalgias and neck pain.  Skin:  Negative for rash.  Neurological:  Negative for dizziness, seizures, syncope and headaches.  Hematological:  Negative for adenopathy. Does not bruise/bleed easily.  Psychiatric/Behavioral:  Negative for dysphoric mood. The patient is not nervous/anxious.     Objective:  BP (!) 168/98 (BP Location: Right Arm, Cuff Size: Large)   Pulse 87   Temp 98.7 F (37.1 C) (Oral)   Ht 5' 5.5" (1.664 m)   Wt 223 lb (101.2 kg)   SpO2 95%   BMI 36.54 kg/m   Wt Readings from Last 3 Encounters:  02/16/23 223 lb (101.2 kg)  02/02/23 223 lb (101.2 kg)  12/11/22 223 lb (101.2 kg)      Physical Exam Vitals and nursing note reviewed.  Constitutional:      General: He is not in acute distress.    Appearance: Normal appearance. He is well-developed. He is not ill-appearing.  HENT:     Head: Normocephalic and atraumatic.     Right Ear: Hearing, tympanic membrane, ear canal and external ear normal.     Left Ear: Hearing, tympanic membrane, ear canal and external ear normal.     Nose: Nose normal.     Mouth/Throat:     Mouth: Mucous membranes are moist.     Pharynx: Oropharynx is clear. No oropharyngeal exudate or posterior oropharyngeal erythema.  Eyes:     General: No scleral icterus.    Extraocular Movements: Extraocular movements intact.     Conjunctiva/sclera: Conjunctivae normal.     Pupils: Pupils are equal, round, and reactive to light.   Neck:     Thyroid: No thyroid mass or thyromegaly.     Vascular: No carotid bruit.  Cardiovascular:     Rate and Rhythm: Normal rate and regular rhythm.     Pulses: Normal pulses.          Radial pulses are 2+ on the right side and 2+ on the left side.     Heart sounds: Normal heart sounds. No murmur heard. Pulmonary:     Effort: Pulmonary effort is normal. No respiratory distress.     Breath sounds: Normal breath sounds. No wheezing, rhonchi or rales.  Abdominal:  General: Bowel sounds are normal. There is no distension.     Palpations: Abdomen is soft. There is no mass.     Tenderness: There is no abdominal tenderness. There is no guarding or rebound.     Hernia: No hernia is present.  Musculoskeletal:        General: Normal range of motion.     Cervical back: Normal range of motion and neck supple.     Right lower leg: No edema.     Left lower leg: No edema.  Lymphadenopathy:     Cervical: No cervical adenopathy.  Skin:    General: Skin is warm and dry.     Findings: No rash.  Neurological:     General: No focal deficit present.     Mental Status: He is alert and oriented to person, place, and time.  Psychiatric:        Mood and Affect: Mood normal.        Behavior: Behavior normal.        Thought Content: Thought content normal.        Judgment: Judgment normal.       Results for orders placed or performed in visit on 02/09/23  CBC with Differential/Platelet  Result Value Ref Range   WBC 8.2 4.0 - 10.5 K/uL   RBC 4.77 4.22 - 5.81 Mil/uL   Hemoglobin 14.4 13.0 - 17.0 g/dL   HCT 40.9 81.1 - 91.4 %   MCV 91.6 78.0 - 100.0 fl   MCHC 33.0 30.0 - 36.0 g/dL   RDW 78.2 95.6 - 21.3 %   Platelets 316.0 150.0 - 400.0 K/uL   Neutrophils Relative % 61.8 43.0 - 77.0 %   Lymphocytes Relative 26.1 12.0 - 46.0 %   Monocytes Relative 6.4 3.0 - 12.0 %   Eosinophils Relative 5.2 (H) 0.0 - 5.0 %   Basophils Relative 0.5 0.0 - 3.0 %   Neutro Abs 5.1 1.4 - 7.7 K/uL   Lymphs  Abs 2.1 0.7 - 4.0 K/uL   Monocytes Absolute 0.5 0.1 - 1.0 K/uL   Eosinophils Absolute 0.4 0.0 - 0.7 K/uL   Basophils Absolute 0.0 0.0 - 0.1 K/uL  Vitamin B12  Result Value Ref Range   Vitamin B-12 744 211 - 911 pg/mL  PSA  Result Value Ref Range   PSA 0.46 0.10 - 4.00 ng/mL  Hemoglobin A1c  Result Value Ref Range   Hgb A1c MFr Bld 5.4 4.6 - 6.5 %  Comprehensive metabolic panel  Result Value Ref Range   Sodium 140 135 - 145 mEq/L   Potassium 4.4 3.5 - 5.1 mEq/L   Chloride 103 96 - 112 mEq/L   CO2 28 19 - 32 mEq/L   Glucose, Bld 100 (H) 70 - 99 mg/dL   BUN 13 6 - 23 mg/dL   Creatinine, Ser 0.86 0.40 - 1.50 mg/dL   Total Bilirubin 0.5 0.2 - 1.2 mg/dL   Alkaline Phosphatase 83 39 - 117 U/L   AST 19 0 - 37 U/L   ALT 15 0 - 53 U/L   Total Protein 6.9 6.0 - 8.3 g/dL   Albumin 4.1 3.5 - 5.2 g/dL   GFR 57.84 >69.62 mL/min   Calcium 9.2 8.4 - 10.5 mg/dL  Lipid panel  Result Value Ref Range   Cholesterol 152 0 - 200 mg/dL   Triglycerides 952.8 0.0 - 149.0 mg/dL   HDL 41.32 >44.01 mg/dL   VLDL 02.7 0.0 - 25.3 mg/dL   LDL Cholesterol  78 0 - 99 mg/dL   Total CHOL/HDL Ratio 3    NonHDL 107.36     Assessment & Plan:   Problem List Items Addressed This Visit     Healthcare maintenance - Primary (Chronic)    Preventative protocols reviewed and updated unless pt declined. Discussed healthy diet and lifestyle.       Advanced directives, counseling/discussion (Chronic)    Advanced directive planning - does not have set up. Advanced directive packet previously provided. Would want wife to be HCPOA.       HLD (hyperlipidemia)    Chronic, stable on current regimen-continue Prevacid. The 10-year ASCVD risk score (Arnett DK, et al., 2019) is: 23.8%   Values used to calculate the score:     Age: 39 years     Sex: Male     Is Non-Hispanic African American: No     Diabetic: No     Tobacco smoker: No     Systolic Blood Pressure: 168 mmHg     Is BP treated: Yes     HDL Cholesterol:  44.4 mg/dL     Total Cholesterol: 152 mg/dL      Relevant Medications   aspirin EC 81 MG tablet (Start on 02/17/2023)   amLODipine (NORVASC) 5 MG tablet   pravastatin (PRAVACHOL) 20 MG tablet   Hyperglycemia    A1c remains in normal range.      Obesity, Class I, BMI 30-34.9    Encouraged healthy diet and lifestyle choices to affect sustainable weight loss.      OSA on CPAP    Appreciate pulmonology care. He continues auto CPAP.      Angular cheilitis    PRN neosporin use.  Suggested trial lotrimin.       Low serum vitamin B12    Stable period on oral B12 replacement Monday Wednesday and Friday      Hypertension    Significant elevation noted today, has had elevated readings previously.  Start amlodipine 5mg  daily. BP log sheet provided.   Reviewed diet and lifestyle choices to affect improved blood pressure control, DASH diet handout and hypertension handout provided today.  Recommend return in 6 weeks for hypertension follow-up visit.      Relevant Medications   aspirin EC 81 MG tablet (Start on 02/17/2023)   amLODipine (NORVASC) 5 MG tablet   pravastatin (PRAVACHOL) 20 MG tablet   Other Visit Diagnoses     Encounter for immunization       Relevant Orders   Flu Vaccine Trivalent High Dose (Fluad) (Completed)        Meds ordered this encounter  Medications   aspirin EC 81 MG tablet    Sig: Take 1 tablet (81 mg total) by mouth every Monday, Wednesday, and Friday. Swallow whole.   amLODipine (NORVASC) 5 MG tablet    Sig: Take 1 tablet (5 mg total) by mouth daily.    Dispense:  30 tablet    Refill:  6   pravastatin (PRAVACHOL) 20 MG tablet    Sig: Take 1 tablet (20 mg total) by mouth daily.    Dispense:  90 tablet    Refill:  4    Orders Placed This Encounter  Procedures   Flu Vaccine Trivalent High Dose (Fluad)    Patient Instructions  Flu shot today  BP was too high today - goal <140/90 Buy blood pressure cuff to start monitoring at home. BP log  sheet provided today.  Work on low salt/sodium diet - goal <  2 grams (2,000mg ) per day. Eat a diet high in fruits/vegetables and whole grains.  Look into mediterranean and DASH diet. Goal activity is 133min/wk of moderate intensity exercise.  This can be split into 30 minute chunks.  If you are not at this level, you can start with smaller 10-15 min increments and slowly build up activity. Look at www.heart.org for more resources  I do recommend starting amlodipine 5mg  daily in the morning. Return in 6 weeks for blood pressure follow up visit.   Follow up plan: Return in about 6 weeks (around 03/30/2023) for follow up visit.  Eustaquio Boyden, MD

## 2023-02-16 NOTE — Assessment & Plan Note (Signed)
Significant elevation noted today, has had elevated readings previously.  Start amlodipine 5mg  daily. BP log sheet provided.   Reviewed diet and lifestyle choices to affect improved blood pressure control, DASH diet handout and hypertension handout provided today.  Recommend return in 6 weeks for hypertension follow-up visit.

## 2023-03-26 ENCOUNTER — Ambulatory Visit: Payer: PPO | Admitting: Orthopaedic Surgery

## 2023-03-26 ENCOUNTER — Encounter: Payer: Self-pay | Admitting: Orthopaedic Surgery

## 2023-03-26 ENCOUNTER — Other Ambulatory Visit (INDEPENDENT_AMBULATORY_CARE_PROVIDER_SITE_OTHER): Payer: Self-pay

## 2023-03-26 DIAGNOSIS — Z96652 Presence of left artificial knee joint: Secondary | ICD-10-CM

## 2023-03-26 DIAGNOSIS — Z96651 Presence of right artificial knee joint: Secondary | ICD-10-CM

## 2023-03-26 NOTE — Progress Notes (Signed)
 Office Visit Note   Patient: Ricky Barton           Date of Birth: 1955/05/05           MRN: [id number] Visit Date: 03/26/2023              Requested by: Rilla Baller, MD [address] [place],  [place] [address] PCP: Rilla Baller, MD   Assessment & Plan: Visit Diagnoses:  1. Status post total right knee replacement   2. Status post total left knee replacement     Plan: Ricky Barton is 2 years status post left total knee and 1 year status post right total knee.  Overall he is doing very well and happy with the outcome.  Both knees are very functional.  He has resumed all baseline activities.  Recheck in 1 year on the right knee with x-rays.  Follow-Up Instructions: Return in about 1 year (around 03/25/2024).   Orders:  Orders Placed This Encounter  Procedures   XR Knee 1-2 Views Right   XR Knee 1-2 Views Left   No orders of the defined types were placed in this encounter.     Procedures: No procedures performed   Clinical Data: No additional findings.   Subjective: Chief Complaint  Patient presents with   Right Knee - Follow-up    Right TKA 04/03/2022   Left Knee - Follow-up    Left TKA 05/12/2021    HPI Ricky Barton is here for postop check for both knees. Review of Systems   Objective: Vital Signs: There were no vitals taken for this visit.  Physical Exam  Ortho Exam Examination of both knees show fully healed surgical scars.  Excellent range of motion.  Collaterals are stable. Specialty Comments:  No specialty comments available.  Imaging: XR Knee 1-2 Views Right Result Date: 03/26/2023 X-rays of the right knee demonstrate a stable right total knee replacement in good alignment   XR Knee 1-2 Views Left Result Date: 03/26/2023 X-rays of the left knee show a stable left total knee replacement in good alignment.     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   Renal lesion 03/28/2018   Arthralgia of hands, bilateral 09/12/2015   OSA on CPAP 09/12/2015   Obesity, Class I, BMI 30-34.9 04/05/2014   Hyperglycemia 03/30/2013   Healthcare maintenance 03/04/2012   HLD (hyperlipidemia) 01/18/2009   ERECTILE DYSFUNCTION, 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   History of seizure 08/15/1991   Past Medical History:  Diagnosis Date   Arthritis    bilateral knees   Hypoglycemic syndrome    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    OSA on CPAP 09/12/2015   Severe by HST   Seizures (HCC) 1990s   isolated, none since, was on dilantin for 3 yrs, then stopped   Sleep apnea    uses CPAP nightly    Family History  Problem Relation Age of Onset   Stomach cancer Mother    Hypertension Mother  Colon polyps Mother    Cancer Father 40       stomach cancer   Hyperlipidemia Brother    CAD Brother 56       MI (smoker)   Hyperlipidemia Brother    Sleep apnea Brother    Pancreatic cancer Brother 29   Hyperlipidemia Brother    CAD Brother 3       MI (smoker)   Hyperlipidemia Brother    Multiple sclerosis Brother    Colon cancer Neg Hx    Esophageal cancer Neg Hx    Rectal cancer Neg Hx     Past Surgical History:  Procedure Laterality Date   CHOLECYSTECTOMY  1989   COLONOSCOPY  03/2011   3 adenomatous polyps, rec rpt 5 yrs (Dr Aneita).   COLONOSCOPY  07/2016   WNL, rpt 5 yrs Oma)   COLONOSCOPY  11/2021   2 HPs, rpt 7 yrs Oma)   Head MRI  08/2000   POLYPECTOMY  2013   TAs x 3   TOTAL KNEE ARTHROPLASTY  Left 05/12/2021   Procedure: LEFT TOTAL KNEE ARTHROPLASTY;  Surgeon: Jerri Kay HERO, MD;  Location: [facility] OR;  Service: Orthopedics;  Laterality: Left;   TOTAL KNEE ARTHROPLASTY Right 04/03/2022   Procedure: RIGHT TOTAL KNEE ARTHROPLASTY;  Surgeon: Jerri Kay HERO, MD;  Location: [facility] OR;  Service: Orthopedics;  Laterality: Right;   Social History   Occupational History   Occupation: Barista: [employer]    Comment: [employer]  Tobacco Use   Smoking status: Never   Smokeless tobacco: Never  Vaping Use   Vaping status: Never Used  Substance and Sexual Activity   Alcohol use: No   Drug use: No   Sexual activity: Yes

## 2023-03-30 ENCOUNTER — Encounter: Payer: Self-pay | Admitting: Family Medicine

## 2023-03-30 ENCOUNTER — Ambulatory Visit (INDEPENDENT_AMBULATORY_CARE_PROVIDER_SITE_OTHER): Payer: PPO | Admitting: Family Medicine

## 2023-03-30 VITALS — BP 122/84 | HR 93 | Temp 98.2°F | Ht 65.5 in | Wt 225.4 lb

## 2023-03-30 DIAGNOSIS — I10 Essential (primary) hypertension: Secondary | ICD-10-CM

## 2023-03-30 DIAGNOSIS — Z6836 Body mass index (BMI) 36.0-36.9, adult: Secondary | ICD-10-CM | POA: Diagnosis not present

## 2023-03-30 MED ORDER — AMLODIPINE BESYLATE 5 MG PO TABS: 5.0000 mg | ORAL_TABLET | Freq: Every day | ORAL | 3 refills | Status: DC

## 2023-03-30 NOTE — Assessment & Plan Note (Signed)
 Discussed benefit of weight loss for blood pressure control.

## 2023-03-30 NOTE — Progress Notes (Signed)
 Ph: [phone] Fax: [phone]   Patient ID: [name], male    DOB: [birth year], 68 y.o.   MRN: [id number]  This visit was conducted in person.  BP 122/84   Pulse 93   Temp 98.2 F (36.8 C) (Oral)   Ht 5' 5.5 (1.664 m)   Wt 225 lb 6 oz (102.2 kg)   SpO2 98%   BMI 36.93 kg/m    CC: HTN f/u visit  Subjective:   HPI: PEGGY MONK is a 68 y.o. male presenting on 03/30/2023 for Medical Management of Chronic Issues (Here for 6 wk HTN f/u.)   HTN - Compliant with current antihypertensive regimen of amlodipine  5mg  daily.  Does check blood pressures at home: see below.  No low blood pressure readings or symptoms of dizziness/syncope.  Denies HA, vision changes, CP/tightness, SOB, leg swelling.    OSA on CPAP - uses nasal pillow with benefit.        Relevant past medical, surgical, family and social history reviewed and updated as indicated. Interim medical history since our last visit reviewed. Allergies and medications reviewed and updated. Outpatient Medications Prior to Visit  Medication Sig Dispense Refill   amoxicillin  (AMOXIL ) 500 MG capsule Take four pills one hour prior to dental work 8 capsule 2   aspirin  EC 81 MG tablet Take 1 tablet (81 mg total) by mouth every Monday, Wednesday, and Friday. Swallow whole.     cetirizine  (ZYRTEC ) 10 MG tablet TAKE 1 TABLET BY MOUTH EVERY DAY (Patient taking differently: Take 10 mg by mouth daily as needed for allergies.) 30 tablet 6   cyanocobalamin  1000 MCG tablet Take 1 tablet (1,000 mcg total) by mouth every Monday, Wednesday, and Friday. Vit b12     ibuprofen  (ADVIL ) 200 MG tablet Take 400 mg by mouth every 6 (six) hours as needed for moderate pain.     pravastatin  (PRAVACHOL ) 20 MG tablet Take 1 tablet (20 mg total) by mouth daily. 90 tablet 4   sildenafil  (VIAGRA ) 100 MG tablet TAKE 1/2 TO 1 TABLET BY MOUTH 1 HOUR PRIOR AS NEEDED 9 tablet 6   sodium chloride  (OCEAN) 0.65 % SOLN nasal spray Place 1 spray into both  nostrils at bedtime.     amLODipine  (NORVASC ) 5 MG tablet Take 1 tablet (5 mg total) by mouth daily. 30 tablet 6   No facility-administered medications prior to visit.     Per HPI unless specifically indicated in ROS section below Review of Systems  Objective:  BP 122/84   Pulse 93   Temp 98.2 F (36.8 C) (Oral)   Ht 5' 5.5 (1.664 m)   Wt 225 lb 6 oz (102.2 kg)   SpO2 98%   BMI 36.93 kg/m   Wt Readings from Last 3 Encounters:  03/30/23 225 lb 6 oz (102.2 kg)  02/16/23 223 lb (101.2 kg)  02/02/23 223 lb (101.2 kg)      Physical Exam Vitals and nursing note reviewed.  Constitutional:      Appearance: Normal appearance.  HENT:     Head: Normocephalic and atraumatic.     Mouth/Throat:     Mouth: Mucous membranes are moist.     Pharynx: Oropharynx is clear. No oropharyngeal exudate or posterior oropharyngeal erythema.  Eyes:     Extraocular Movements: Extraocular movements intact.     Pupils: Pupils are equal, round, and reactive to light.  Cardiovascular:     Rate and Rhythm: Normal rate and regular rhythm.  Pulses: Normal pulses.     Heart sounds: Normal heart sounds. No murmur heard. Pulmonary:     Effort: Pulmonary effort is normal. No respiratory distress.     Breath sounds: Normal breath sounds. No wheezing, rhonchi or rales.  Musculoskeletal:     Right lower leg: No edema.     Left lower leg: No edema.  Skin:    General: Skin is warm and dry.     Findings: No rash.  Neurological:     Mental Status: He is alert.  Psychiatric:        Mood and Affect: Mood normal.        Behavior: Behavior normal.        Assessment & Plan:   Problem List Items Addressed This Visit     Severe obesity (BMI 35.0-39.9) with comorbidity (HCC)   Discussed benefit of weight loss for blood pressure control.       Hypertension - Primary   Great control since on amlodipine  5mg  daily - continue this.  BP log sheet reviewed.  Encouraged continued healthy diet choices to  help control blood pressure.       Relevant Medications   amLODipine  (NORVASC ) 5 MG tablet     Meds ordered this encounter  Medications   amLODipine  (NORVASC ) 5 MG tablet    Sig: Take 1 tablet (5 mg total) by mouth daily.    Dispense:  90 tablet    Refill:  3    No orders of the defined types were placed in this encounter.   Patient Instructions  Blood pressures are doing much better! Continue amlodipine  5mg  daily.  Continue healthy diet choices.  Follow up plan: Return if symptoms worsen or fail to improve, for annual exam, prior fasting for blood work.  Anton Blas, MD

## 2023-03-30 NOTE — Assessment & Plan Note (Signed)
 Great control since on amlodipine 5mg  daily - continue this.  BP log sheet reviewed.  Encouraged continued healthy diet choices to help control blood pressure.

## 2023-03-30 NOTE — Patient Instructions (Addendum)
 Blood pressures are doing much better! Continue amlodipine 5mg  daily.  Continue healthy diet choices.

## 2023-05-21 ENCOUNTER — Telehealth: Payer: Self-pay | Admitting: *Deleted

## 2023-05-21 NOTE — Telephone Encounter (Signed)
 1 year Ortho bundle call completed for Right total knee arthroplasty.

## 2023-06-08 DIAGNOSIS — G4733 Obstructive sleep apnea (adult) (pediatric): Secondary | ICD-10-CM | POA: Diagnosis not present

## 2023-09-09 DIAGNOSIS — G4733 Obstructive sleep apnea (adult) (pediatric): Secondary | ICD-10-CM | POA: Diagnosis not present

## 2023-12-13 ENCOUNTER — Ambulatory Visit: Payer: PPO | Admitting: Primary Care

## 2023-12-13 ENCOUNTER — Encounter: Payer: Self-pay | Admitting: Primary Care

## 2023-12-13 VITALS — BP 122/82 | HR 75 | Temp 97.8°F | Ht 66.0 in | Wt 229.4 lb

## 2023-12-13 DIAGNOSIS — G4733 Obstructive sleep apnea (adult) (pediatric): Secondary | ICD-10-CM

## 2023-12-13 NOTE — Patient Instructions (Addendum)
 VISIT SUMMARY: You came in today for a follow-up visit regarding your severe sleep apnea. You have been using a CPAP machine since December 2020, and your condition is currently well-controlled with an apnea-hypopnea index of 0.7 events per hour. You reported that you sleep well at night and find the auto ramp feature on your CPAP machine helpful. You also mentioned that you purchase your CPAP supplies through [place] and are satisfied with their service.  YOUR PLAN: -OBSTRUCTIVE SLEEP APNEA: Obstructive sleep apnea is a condition where your airway becomes blocked during sleep, causing breathing pauses. Your condition is well-controlled with your current CPAP therapy, which you use consistently. Your apnea-hypopnea index is now 0.7 events per hour, indicating effective treatment. We will submit a prescription for a new CPAP machine with the same settings and auto ramp feature, as your current machine is nearly 68 years old. Please follow up with your supplier in December to ensure you receive the new machine. Additionally, a compliance check will be scheduled within 90 days of receiving the new machine, which can be done virtually. Continue to receive your supplies through [employer].  INSTRUCTIONS: Follow up with your CPAP supplier in December to ensure you receive the new machine. Schedule a compliance check within 90 days of receiving the new machine, which can be conducted virtually.  Orders Replacement CPAP 5-15cm h20 / due in December 2025  Follow-up Please call to schedule compliance check in after you get your new Cpap machine in December (due February-March 2026)  CPAP and BIPAP Information CPAP and BIPAP use air pressure to keep your airways open and help you breathe well. CPAP and BIPAP use different amounts of pressure. Your health care provider will tell you whether CPAP or BIPAP would be best for you. CPAP stands for continuous positive airway pressure. With  CPAP, the amount of pressure stays the same while you breathe in and out. BIPAP stands for bi-level positive airway pressure. With BIPAP, the amount of pressure will be higher when you breathe in and lower when you breathe out. This allows you to take bigger breaths. CPAP or BIPAP may be used in the hospital or at home. You may need to have a sleep study before your provider can order a device for you to use at home. What are the advantages? CPAP and BIPAP are most often used for obstructive sleep apnea to keep the airways from collapsing when the muscles relax during sleep. CPAP or BIPAP can be used if you have: Chronic obstructive pulmonary disease. Heart failure. Medical conditions that cause muscle weakness. Other problems that cause breathing to be shallow, weak, or difficult. What are the risks? Your provider will talk with you about risks. These may include: Sores on your nose or face caused from the mask, prongs, or nasal pillows. Dry or stuffy nose or nosebleeds. Feeling gassy or bloated. Sinus or lung infection if the equipment is not cleaned well. When should CPAP or BIPAP be used? In most cases, CPAP or BIPAP is used during sleep at night or whenever the main sleep time happens. It's also used during naps. People with some medical conditions may need to wear the mask when they're awake. Follow instructions from your provider about when to use your CPAP or BIPAP. What happens during CPAP or BIPAP?  Both CPAP and BIPAP use a small machine that uses electricity to create air pressure. A long tube connects the device to a plastic mask. Air is blown through the mask into  your nose or mouth. The amount of pressure that's used to blow the air can be adjusted. Your provider will set the pressure setting and help you find the best mask for you. Tips for using the mask There are different types and sizes of masks. If your mask does not fit well, talk with your provider about getting a  different one. Some common types of masks include: Full face masks, which fit over the mouth and nose. Nasal masks, which fit over the nose. Nasal pillow or prong masks, which fit into the nostrils. The mask needs to be snug to your face, so some people feel trapped or closed in at first. If you feel this way, you may need to get used to the mask. Hold the mask loosely over your nose or mouth and then gradually put the the mask on more snugly. Slowly increase the amount of time you use the mask. If you have trouble with your mask not fitting well or leaking, talk with your provider. Do not stop using the mask. Tips for using the device Follow instructions from your provider about how to and how often to use the device. For home use, CPAP and BIPAP devices come from home health care companies. There are many different brands. Your health insurance company will help to decide which device you get. Keep the CPAP or BIPAP device and attachments clean. Ask your home health care company or check the instruction book for cleaning instructions. Make sure the humidifier is filled with germ-free (sterile) water  and is working correctly. This will help prevent a dry or stuffy nose or nosebleeds. A nasal saline mist or spray may keep your nose from getting dry and sore. Do not eat or drink while the CPAP or BIPAP device is on. Food or drinks could get pushed into your lungs by the pressure of the CPAP or BIPAP. Follow these instructions at home: Take over-the-counter and prescription medicines only as told by your provider. Do not smoke, vape, or use nicotine or tobacco. Contact a health care provider if: You have redness or pressure sores on your head, face, mouth, or nose from the mask or headgear. You have trouble using the CPAP or BIPAP device. You have trouble going to sleep or staying asleep. Someone tells you that you snore even when wearing your CPAP or BIPAP device. Get help right away if: You  have trouble breathing. You feel confused. These symptoms may be an emergency. Get help right away. Call 911. Do not wait to see if the symptoms will go away. Do not drive yourself to the hospital. This information is not intended to replace advice given to you by your health care provider. Make sure you discuss any questions you have with your health care provider. Document Revised: 06/24/2022 Document Reviewed: 06/24/2022 Elsevier Patient Education  2024 [employer].

## 2023-12-13 NOTE — Progress Notes (Signed)
 @Patient  ID: Ricky Barton, male    DOB: Dec 24, 1955, 68 y.o.   MRN: [id number]  No chief complaint on file.   Referring provider: Rilla Baller, MD  HPI: 68 year old male, never smoked. PMH significant for OSA on CPAP, allergic rhinitis, hyperlipidemia.   Previous [facility] pulmonary encounter:  12/11/2022 Patient presents today for annual OSA follow-up Maintained on auto CPAP  He reports sleeping pretty well He is compliant with CPAP use and reports benefit Snoring has improved  He wakes up on occasion due leg pain He uses nasal mask, able to sleep comfortably on his side No issues with mask fit or pressure settings  No significant daytime sleepiness  Airview download 11/10/22- 12/09/22 Usage days 30/30 days (100%) Average usage 7 hours 41 minutes Pressure 5 to 15 cm H2O (11.6 cm H2O-95%) Air leaks 6 L/min (95%) AHI 0.3   12/13/2023- Interim hx  Discussed the use of AI scribe software for clinical note transcription with the patient, who gave verbal consent to proceed.  History of Present Illness Ricky Barton is a 68 year old male with severe sleep apnea who presents for a follow-up visit.  He was diagnosed with severe sleep apnea in 2020 after a sleep study showed an average of 53 apneic events per hour. Since December 2020, he has been using a CPAP machine.  He uses the CPAP machine consistently, averaging 7 hours and 48 minutes of use per night. The machine is set to auto with a minimum pressure of 5 and a maximum of 15. His current apnea-hypopnea index is 0.7, indicating less than one event per hour.  He notes a benefit from using the CPAP, stating he sleeps well at night, although his dogs sometimes wake him up early. He uses the auto ramp feature on his CPAP, which he finds helpful, and he lies still for about two minutes to get used to it before falling asleep.  He purchases his CPAP supplies through [employer] and prefers this method for the convenience  of speaking to someone directly if issues arise. He uses a nasal mask, specifically the full triangle type, and reports no significant air leaks.      Allergies  Allergen Reactions   Piroxicam Rash    blisters    Immunization History  Administered Date(s) Administered   Fluad Quad(high Dose 65+) 12/06/2020, 02/11/2022   Fluad Trivalent(High Dose 65+) 02/16/2023   Influenza Split 03/04/2011, 03/04/2012   Influenza Whole 01/24/2009, 02/26/2010   Influenza,inj,Quad PF,6+ Mos 03/30/2013, 04/05/2014, 11/29/2017, 11/10/2018   PFIZER(Purple Top)SARS-COV-2 Vaccination 06/03/2019, 06/26/2019   PNEUMOCOCCAL CONJUGATE-20 12/06/2020   Td 01/18/1996, 01/24/2009   Tdap 06/07/2017, 11/18/2018   Zoster Recombinant(Shingrix) 11/29/2017, 03/07/2018    Past Medical History:  Diagnosis Date   Arthritis    bilateral knees   Hypoglycemic syndrome    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 90 tablet 3   amoxicillin  (AMOXIL ) 500 MG capsule Take  four pills one hour prior to dental work 8 capsule 2   aspirin  EC 81 MG tablet Take 1 tablet (81 mg total) by mouth every Monday, Wednesday, and Friday. Swallow whole.     cetirizine  (ZYRTEC ) 10 MG tablet TAKE 1 TABLET BY MOUTH EVERY DAY (Patient taking differently: Take 10 mg by mouth daily as needed for allergies.) 30 tablet 6   cyanocobalamin  1000 MCG tablet Take 1 tablet (1,000 mcg total) by mouth every Monday, Wednesday, and Friday. Vit b12     ibuprofen  (ADVIL ) 200 MG tablet  Take 400 mg by mouth every 6 (six) hours as needed for moderate pain.     pravastatin  (PRAVACHOL ) 20 MG tablet Take 1 tablet (20 mg total) by mouth daily. 90 tablet 4   sildenafil  (VIAGRA ) 100 MG tablet TAKE 1/2 TO 1 TABLET BY MOUTH 1 HOUR PRIOR AS NEEDED 9 tablet 6   sodium chloride  (OCEAN) 0.65 % SOLN nasal spray Place 1 spray into both nostrils at bedtime.     No facility-administered medications prior to visit.   Review of Systems  Review of Systems  Constitutional: Negative.   Respiratory: Negative.    Cardiovascular: Negative.    Physical Exam  There were no vitals taken for this visit. Physical Exam Constitutional:      Appearance: Normal appearance. He is well-developed.  HENT:     Head: Normocephalic and atraumatic.     Mouth/Throat:     Mouth: Mucous membranes are moist.     Pharynx: Oropharynx is clear.  Cardiovascular:     Rate and Rhythm: Normal rate and regular rhythm.     Heart sounds: Normal heart sounds.  Pulmonary:     Effort: Pulmonary effort is normal. No respiratory distress.     Breath sounds: Normal breath sounds. No wheezing or rhonchi.  Musculoskeletal:        General: Normal range of motion.     Cervical back: Normal range of motion and neck supple.  Skin:    General: Skin is warm and dry.     Findings: No erythema or rash.  Neurological:     General: No focal deficit present.     Mental Status: He is alert and oriented to person, place, and time. Mental status is at baseline.  Psychiatric:        Mood and Affect: Mood normal.        Behavior: Behavior normal.        Thought Content: Thought content normal.        Judgment: Judgment normal.     Lab Results:  CBC    Component Value Date/Time   WBC 8.2 02/09/2023 0741   RBC 4.77 02/09/2023 0741   HGB 14.4 02/09/2023 0741   HCT 43.7 02/09/2023 0741   PLT 316.0 02/09/2023 0741   MCV 91.6 02/09/2023 0741   MCH 29.3 07/05/2022 0821   MCHC 33.0 02/09/2023 0741   RDW 13.7 02/09/2023 0741    LYMPHSABS 2.1 02/09/2023 0741   MONOABS 0.5 02/09/2023 0741   EOSABS 0.4 02/09/2023 0741   BASOSABS 0.0 02/09/2023 0741    BMET    Component Value Date/Time   NA 140 02/09/2023 0741   K 4.4 02/09/2023 0741   CL 103 02/09/2023 0741   CO2 28 02/09/2023 0741   GLUCOSE 100 (H) 02/09/2023 0741   BUN 13 02/09/2023 0741   CREATININE 0.80 02/09/2023 0741   CALCIUM  9.2 02/09/2023 0741   GFRNONAA >60 07/05/2022 0821   GFRAA >60 03/06/2019  1709    BNP No results found for: BNP  ProBNP No results found for: PROBNP  Imaging: No results found.   Assessment & Plan:   1. OSA on CPAP (Primary)  Assessment and Plan Assessment & Plan Obstructive sleep apnea Severe obstructive sleep apnea diagnosed in 2020 with an average of 53 apneic events per hour. Currently well-controlled with CPAP therapy, showing an apnea-hypopnea index of 0.7 events per hour. CPAP usage is consistent at 100% with an average of 7 hours and 48 minutes per night. Current settings are on auto with a minimum pressure of 5 and a maximum of 15. He reports subjective improvement in sleep quality. No significant air leaks noted with the nasal mask. He is due for a new CPAP machine as the current one is nearly 68 years old. Discussed the option of obtaining a new machine, which is typically covered by insurance every five years. Insurance will require a compliance check within 90 days of receiving the new machine, which can be conducted virtually. - Submit prescription for a new CPAP machine with current settings and auto ramp feature. - Advise him to follow up with the supplier in December to ensure receipt of the new machine. - Schedule a compliance check within 90 days of receiving the new machine, which can be conducted virtually. - Ensure he continues to receive supplies through his preferred supplier, [employer].   Almarie LELON Ferrari, NP 12/13/2023

## 2024-01-17 ENCOUNTER — Encounter: Payer: Self-pay | Admitting: Radiology

## 2024-02-03 ENCOUNTER — Ambulatory Visit (INDEPENDENT_AMBULATORY_CARE_PROVIDER_SITE_OTHER): Payer: PPO

## 2024-02-03 VITALS — Ht 66.0 in | Wt 229.0 lb

## 2024-02-03 DIAGNOSIS — Z0000 Encounter for general adult medical examination without abnormal findings: Secondary | ICD-10-CM

## 2024-02-03 NOTE — Patient Instructions (Signed)
 Ricky Barton,  Thank you for taking the time for your Medicare Wellness Visit. I appreciate your continued commitment to your health goals. Please review the care plan we discussed, and feel free to reach out if I can assist you further.  Please note that Annual Wellness Visits do not include a physical exam. Some assessments may be limited, especially if the visit was conducted virtually. If needed, we may recommend an in-person follow-up with your provider.  Ongoing Care Seeing your primary care provider every 3 to 6 months helps us  monitor your health and provide consistent, personalized care.   Referrals If a referral was made during today's visit and you haven't received any updates within two weeks, please contact the referred provider directly to check on the status.  Recommended Screenings:  Health Maintenance  Topic Date Due   COVID-19 Vaccine (3 - 2025-26 season) 11/15/2023   Medicare Annual Wellness Visit  02/02/2024   Flu Shot  06/13/2024*   Colon Cancer Screening  11/19/2026   DTaP/Tdap/Td vaccine (5 - Td or Tdap) 11/17/2028   Pneumococcal Vaccine for age over 28  Completed   Hepatitis C Screening  Completed   Zoster (Shingles) Vaccine  Completed   Meningitis B Vaccine  Aged Out  *Topic was postponed. The date shown is not the original due date.       02/03/2024    8:14 AM  Advanced Directives  Does Patient Have a Medical Advance Directive? No    Vision: Annual vision screenings are recommended for early detection of glaucoma, cataracts, and diabetic retinopathy. These exams can also reveal signs of chronic conditions such as diabetes and high blood pressure.  Dental: Annual dental screenings help detect early signs of oral cancer, gum disease, and other conditions linked to overall health, including heart disease and diabetes.

## 2024-02-03 NOTE — Progress Notes (Signed)
 Please attest and cosign this visit due to patients primary care provider not being in the office at the time the visit was completed.    Chief Complaint  Patient presents with   Medicare Wellness     Subjective:   Ricky Barton is a 68 y.o. male who presents for a Medicare Annual Wellness Visit.  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   Procedure: LEFT TOTAL KNEE ARTHROPLASTY;  Surgeon: Jerri Kay HERO, MD;  Location: [facility] OR;  Service: Orthopedics;  Laterality: Left;   TOTAL KNEE ARTHROPLASTY Right 04/03/2022   Procedure: RIGHT TOTAL KNEE ARTHROPLASTY;  Surgeon: Jerri Kay HERO, MD;  Location: [facility] OR;  Service: Orthopedics;  Laterality: Right;   Family History  Problem Relation Age of Onset   Stomach cancer Mother    Hypertension Mother    Colon polyps Mother    Cancer Father 66       stomach cancer   Hyperlipidemia Brother    CAD Brother 77       MI (smoker)   Hyperlipidemia Brother    Sleep apnea Brother    Pancreatic cancer Brother 52   Hyperlipidemia Brother    CAD Brother 46       MI (smoker)   Hyperlipidemia Brother    Multiple sclerosis Brother    Colon cancer Neg Hx    Esophageal cancer Neg Hx    Rectal cancer Neg Hx    Social History   Occupational History   Occupation: Route  Teacher, Adult Education: [employer]    Comment: [employer]  Tobacco Use   Smoking status: Never   Smokeless tobacco: Never  Vaping Use   Vaping status: Never Used  Substance and Sexual Activity   Alcohol use: No   Drug use: No   Sexual activity: Yes   Tobacco Counseling Counseling given: Not Answered  SDOH Screenings   Food Insecurity: No Food Insecurity (02/03/2024)  Housing: Unknown (02/03/2024)  Transportation Needs: No Transportation Needs (02/03/2024)  Utilities: Not At Risk (02/03/2024)  Alcohol Screen: Low Risk  (02/02/2023)  Depression (PHQ2-9): Low Risk  (02/03/2024)  Financial Resource Strain: Low Risk  (02/02/2023)  Physical Activity: Sufficiently Active (02/03/2024)  Social Connections: Socially Integrated (02/03/2024)  Stress: No Stress Concern Present (02/03/2024)  Tobacco Use: Low Risk  (02/03/2024)  Health Literacy: Adequate Health Literacy (02/03/2024)   See flowsheets for full screening details  Depression Screen PHQ 2 & 9 Depression Scale- Over the past 2 weeks, how often have you been bothered by any of the following problems? Little interest or pleasure in doing things: 0 Feeling down, depressed,  or hopeless (PHQ Adolescent also includes...irritable): 0 PHQ-2 Total Score: 0     Goals Addressed             This Visit's Progress    Patient Stated       Get healthier, more walking     Weight (lb) < 200 lb (90.7 kg)   229 lb (103.9 kg)    Lose some weight 20lb       Visit info / Clinical Intake: Medicare Wellness Visit Type:: Subsequent Annual Wellness Visit Persons participating in visit:: patient Medicare Wellness Visit Mode:: Telephone If telephone:: video declined Because this visit was a virtual/telehealth visit:: unable to obtan vitals due to lack of equipment If Telephone or Video please confirm:: I discussed the limitations of evaluation and management by telemedicine Patient Location:: home Provider Location::  clinic Information given by:: patient Interpreter Needed?: No Pre-visit prep was completed: yes AWV questionnaire completed by patient prior to visit?: no Living arrangements:: lives with spouse/significant other Patient's Overall Health Status Rating: very good Typical amount of pain: none Does pain affect daily life?: no Are you currently prescribed opioids?: no  Dietary Habits and Nutritional Risks How many meals a day?: 3 Eats fruit and vegetables daily?: yes Most meals are obtained by: preparing own meals In the last 2 weeks, have you had any of the following?: none Diabetic:: no  Functional Status Activities of Daily Living (to include ambulation/medication): Independent Ambulation: Independent Medication Administration: Independent Home Management: Independent Manage your own finances?: yes Primary transportation is: driving Concerns about vision?: no *vision screening is required for WTM* Concerns about hearing?: no  Fall Screening Falls in the past year?: 0 Number of falls in past year: 0 Was there an injury with Fall?: 0 Fall Risk Category Calculator: 0 Patient Fall Risk Level: Low Fall Risk  Fall Risk Patient at Risk for Falls Due to: No Fall Risks Fall risk Follow up: Education provided; Falls prevention discussed  Home and Transportation Safety: All rugs have non-skid backing?: yes All stairs or steps have railings?: yes Grab bars in the bathtub or shower?: (!) no Have non-skid surface in bathtub or shower?: yes Good home lighting?: yes Regular seat belt use?: yes Hospital stays in the last year:: no  Cognitive Assessment Difficulty concentrating, remembering, or making decisions? : no Will 6CIT or Mini Cog be Completed: yes What year is it?: 0 points What month is it?: 0 points Give patient an address phrase to remember (5 components): [address] [place]  About what time is it?: 0 points Count backwards from 20 to 1: 0 points Say the  months of the year in reverse: 0 points Repeat the address phrase from earlier: 0 points 6 CIT Score: 0 points  Advance Directives (For Healthcare) Does Patient Have a Medical Advance Directive?: No  Reviewed/Updated  Reviewed/Updated: Reviewed All (Medical, Surgical, Family, Medications, Allergies, Care Teams, Patient Goals)        Objective:    Today's Vitals   02/03/24 0813  Weight: 229 lb (103.9 kg)  Height: 5' 6 (1.676 m)   Body mass index is 36.96 kg/m.  Current Medications (verified) Outpatient Encounter Medications as of 02/03/2024  Medication Sig   amLODipine  (NORVASC ) 5 MG tablet Take 1 tablet (5 mg total) by mouth daily.   amoxicillin  (AMOXIL ) 500 MG capsule Take four pills one hour prior to dental work   aspirin  EC 81 MG tablet Take 1 tablet (81 mg total) by mouth every Monday, Wednesday, and Friday. Swallow  whole.   cetirizine  (ZYRTEC ) 10 MG tablet TAKE 1 TABLET BY MOUTH EVERY DAY (Patient taking differently: Take 10 mg by mouth daily as needed for allergies.)   cyanocobalamin  1000 MCG tablet Take 1 tablet (1,000 mcg total) by mouth every Monday, Wednesday, and Friday. Vit b12   ibuprofen  (ADVIL ) 200 MG tablet Take 400 mg by mouth every 6 (six) hours as needed for moderate pain.   pravastatin  (PRAVACHOL ) 20 MG tablet Take 1 tablet (20 mg total) by mouth daily.   sildenafil  (VIAGRA ) 100 MG tablet TAKE 1/2 TO 1 TABLET BY MOUTH 1 HOUR PRIOR AS NEEDED   sodium chloride  (OCEAN) 0.65 % SOLN nasal spray Place 1 spray into both nostrils at bedtime.   No facility-administered encounter medications on file as of 02/03/2024.   Hearing/Vision screen Vision Screening - Comments:: UTD w/visits to Dr KANDICE King Immunizations and Health Maintenance Health Maintenance  Topic Date Due   COVID-19 Vaccine (3 - 2025-26 season) 11/15/2023   Medicare Annual Wellness (AWV)  02/02/2024   Influenza Vaccine  06/13/2024 (Originally 10/15/2023)   Colonoscopy  11/19/2026   DTaP/Tdap/Td (5 -  Td or Tdap) 11/17/2028   Pneumococcal Vaccine: 50+ Years  Completed   Hepatitis C Screening  Completed   Zoster Vaccines- Shingrix  Completed   Meningococcal B Vaccine  Aged Out        Assessment/Plan:  This is a routine wellness examination for Glenn.  Patient Care Team: Rilla Baller, MD as PCP - General (Family Medicine) King Molly, MD as Consulting Physician (Ophthalmology)  I have personally reviewed and noted the following in the patient's chart:   Medical and social history Use of alcohol, tobacco or illicit drugs  Current medications and supplements including opioid prescriptions. Functional ability and status Nutritional status Physical activity Advanced directives List of other physicians Hospitalizations, surgeries, and ER visits in previous 12 months Vitals Screenings to include cognitive, depression, and falls Referrals and appointments  No orders of the defined types were placed in this encounter.  In addition, I have reviewed and discussed with patient certain preventive protocols, quality metrics, and best practice recommendations. A written personalized care plan for preventive services as well as general preventive health recommendations were provided to patient.   Erminio LITTIE Saris, LPN   [id number]/[id number]    After Visit Summary: (MyChart) Due to this being a telephonic visit, the after visit summary with patients personalized plan was offered to patient via MyChart   Nurse Notes: Pt has no concerns or questions. AWV/CPE made for next year simultaneously

## 2024-02-12 ENCOUNTER — Other Ambulatory Visit: Payer: Self-pay | Admitting: Family Medicine

## 2024-02-12 DIAGNOSIS — E782 Mixed hyperlipidemia: Secondary | ICD-10-CM

## 2024-02-12 DIAGNOSIS — E538 Deficiency of other specified B group vitamins: Secondary | ICD-10-CM

## 2024-02-12 DIAGNOSIS — R739 Hyperglycemia, unspecified: Secondary | ICD-10-CM

## 2024-02-12 DIAGNOSIS — Z125 Encounter for screening for malignant neoplasm of prostate: Secondary | ICD-10-CM

## 2024-02-16 ENCOUNTER — Other Ambulatory Visit: Payer: PPO

## 2024-02-16 DIAGNOSIS — R739 Hyperglycemia, unspecified: Secondary | ICD-10-CM

## 2024-02-16 DIAGNOSIS — E538 Deficiency of other specified B group vitamins: Secondary | ICD-10-CM | POA: Diagnosis not present

## 2024-02-16 DIAGNOSIS — E782 Mixed hyperlipidemia: Secondary | ICD-10-CM

## 2024-02-16 DIAGNOSIS — Z125 Encounter for screening for malignant neoplasm of prostate: Secondary | ICD-10-CM

## 2024-02-16 LAB — LIPID PANEL
Cholesterol: 145 mg/dL (ref 0–200)
HDL: 47.2 mg/dL (ref >39.00)
LDL Cholesterol: 78 mg/dL (ref 0–99)
NonHDL: 97.57
Total CHOL/HDL Ratio: 3
Triglycerides: 100 mg/dL (ref 0.0–149.0)
VLDL: 20 mg/dL (ref 0.0–40.0)

## 2024-02-16 LAB — COMPREHENSIVE METABOLIC PANEL WITH GFR
ALT: 18 U/L (ref 0–53)
AST: 20 U/L (ref 0–37)
Albumin: 4.1 g/dL (ref 3.5–5.2)
Alkaline Phosphatase: 80 U/L (ref 39–117)
BUN: 14 mg/dL (ref 6–23)
CO2: 26 meq/L (ref 19–32)
Calcium: 9 mg/dL (ref 8.4–10.5)
Chloride: 104 meq/L (ref 96–112)
Creatinine, Ser: 0.72 mg/dL (ref 0.40–1.50)
GFR: 93.82 mL/min (ref >60.00)
Glucose, Bld: 104 mg/dL — ABNORMAL HIGH (ref 70–99)
Potassium: 4.2 meq/L (ref 3.5–5.1)
Sodium: 139 meq/L (ref 135–145)
Total Bilirubin: 0.3 mg/dL (ref 0.2–1.2)
Total Protein: 6.7 g/dL (ref 6.0–8.3)

## 2024-02-16 LAB — HEMOGLOBIN A1C: Hgb A1c MFr Bld: 5.2 % (ref 4.6–6.5)

## 2024-02-16 LAB — PSA: PSA: 0.39 ng/mL (ref 0.10–4.00)

## 2024-02-16 LAB — VITAMIN B12: Vitamin B-12: 477 pg/mL (ref 211–911)

## 2024-02-17 ENCOUNTER — Ambulatory Visit: Payer: Self-pay | Admitting: Family Medicine

## 2024-02-23 ENCOUNTER — Ambulatory Visit: Payer: PPO | Admitting: Family Medicine

## 2024-02-23 ENCOUNTER — Encounter: Payer: Self-pay | Admitting: Family Medicine

## 2024-02-23 VITALS — BP 126/80 | HR 77 | Temp 98.2°F | Ht 66.0 in | Wt 234.8 lb

## 2024-02-23 DIAGNOSIS — M25512 Pain in left shoulder: Secondary | ICD-10-CM | POA: Diagnosis not present

## 2024-02-23 DIAGNOSIS — R739 Hyperglycemia, unspecified: Secondary | ICD-10-CM

## 2024-02-23 DIAGNOSIS — G8929 Other chronic pain: Secondary | ICD-10-CM | POA: Insufficient documentation

## 2024-02-23 DIAGNOSIS — I10 Essential (primary) hypertension: Secondary | ICD-10-CM | POA: Diagnosis not present

## 2024-02-23 DIAGNOSIS — Z23 Encounter for immunization: Secondary | ICD-10-CM

## 2024-02-23 DIAGNOSIS — E538 Deficiency of other specified B group vitamins: Secondary | ICD-10-CM | POA: Diagnosis not present

## 2024-02-23 DIAGNOSIS — Z0000 Encounter for general adult medical examination without abnormal findings: Secondary | ICD-10-CM | POA: Diagnosis not present

## 2024-02-23 DIAGNOSIS — E782 Mixed hyperlipidemia: Secondary | ICD-10-CM | POA: Diagnosis not present

## 2024-02-23 DIAGNOSIS — Z7189 Other specified counseling: Secondary | ICD-10-CM | POA: Diagnosis not present

## 2024-02-23 MED ORDER — AMLODIPINE BESYLATE 5 MG PO TABS: 5.0000 mg | ORAL_TABLET | Freq: Every day | ORAL | 3 refills | Status: AC

## 2024-02-23 MED ORDER — PRAVASTATIN SODIUM 20 MG PO TABS: 20.0000 mg | ORAL_TABLET | Freq: Every day | ORAL | 4 refills | Status: AC

## 2024-02-23 NOTE — Assessment & Plan Note (Signed)
 Continue to encourage healthy diet and lifestyle choices to affect sustainable weight loss.  Obesity complicated by comorbidities of HTN, HLD, hyperglycemia, osteoarthritis.

## 2024-02-23 NOTE — Assessment & Plan Note (Signed)
 Overall benign exam today  He does have some discomfort with testing glenohumeral joint, no significant pain to RTC testing. ?osteoarthritis vs mild labral injury. Rec tylenol  or ibuprofen  at night PRN, provided with general shoulder exercises to try. Update if new or worsening symptoms.

## 2024-02-23 NOTE — Assessment & Plan Note (Signed)
 Chronic, stable on pravastatin  - continue. The 10-year ASCVD risk score (Arnett DK, et al., 2019) is: 15.3%   Values used to calculate the score:     Age: 68 years     Clincally relevant sex: Male     Is Non-Hispanic African American: No     Diabetic: No     Tobacco smoker: No     Systolic Blood Pressure: 126 mmHg     Is BP treated: Yes     HDL Cholesterol: 47.2 mg/dL     Total Cholesterol: 145 mg/dL

## 2024-02-23 NOTE — Progress Notes (Signed)
 Ph: [phone] Fax: [phone]   Patient ID: Ricky Barton, male    DOB: Mar 21, 1955, 68 y.o.   MRN: [id number]  This visit was conducted in person.  BP 126/80 (Cuff Size: Large)   Pulse 77   Temp 98.2 F (36.8 C) (Oral)   Ht 5' 6 (1.676 m)   Wt 234 lb 12.8 oz (106.5 kg)   SpO2 97%   BMI 37.90 kg/m    CC: CPE, left shoulder pain  Subjective:   HPI: Ricky Barton is a 68 y.o. male presenting on 02/23/2024 for Medical Management of Chronic Issues (Left shoulder pain , onset 3-4 months ago, /Only hurts when laying down, pain wakes pt up out of bed and then stops hurting around 3pm, happens atleast weekly)   Saw health advisor last month for medicare wellness visit. Note reviewed.   No results found.  Flowsheet Row Office Visit from 02/23/2024 in [facility] [place]  PHQ-2 Total Score 0       02/23/2024    9:16 AM 02/03/2024    8:14 AM 02/02/2023    8:24 AM 01/05/2022    9:06 AM 12/06/2020   10:59 AM  Fall Risk   Falls in the past year? 0 0 0 0 0  Number falls in past yr: 0 0 0 0   Injury with Fall? 0 0  0  0    Risk for fall due to :  No Fall Risks No Fall Risks    Follow up Falls evaluation completed Education provided;Falls prevention discussed Education provided;Falls prevention discussed Falls evaluation completed;Education provided;Falls prevention discussed       Data saved with a previous flowsheet row definition    OSA on auto-CPAP sees pulmonology yearly    S/p L knee replacement 04/2021, R knee replacement 03/2022 Annemarie).   L shoulder pain present for 3-4 months, denies inciting trauma/injury. L shoulder wakes him up several nights/week around 2-3am. He sleeps on right side. Tooth ache pain. No significant pain during the day. Points to entire shoulder joint as location of pain. No right shoulder pain or neck pain.    Preventative: Colonoscopy 11/2021 - 2 TAs, int hem,rpt 7 yrs Oma) Prostate cancer screening -  continue PSA yearly  Lung cancer screening - not eligible  AAA screen - not eligible  Flu shot yearly  Prevnar-20 11/2020 Td 2010, Tdap 05/2017  COVID vaccine - Pfizer 05/2019, 06/2019  Shingrix - 11/2017, 02/2018  Advanced directive planning - does not have set up. Advanced directive packet previously provided. Would want wife to be HCPOA.  Seat belt use discussed Sunscreen use discussed.  No changing moles on skin. Sleep - averaging 7 hours/night Non smoker Alcohol - none Dentist - Q45mo  Eye exam - yearly - recent bilateral cataract surgery  Bowel - no constipation  Bladder - no incontinence    Caffeine: 1 cup coffee/day  Married and lives with wife, grown children, dogs  Occupation: [employer]  Activity: golfing, walks dogs at least 1 mile, has not been to gym recently - likes swimming  Diet: good water , fruits/vegetables daily      Relevant past medical, surgical, family and social history reviewed and updated as indicated. Interim medical history since our last visit reviewed. Allergies and medications reviewed and updated. Outpatient Medications Prior to Visit  Medication Sig Dispense Refill   aspirin  EC 81 MG tablet Take 1 tablet (81 mg total) by mouth every Monday, Wednesday, and Friday.  Swallow whole.     cetirizine  (ZYRTEC ) 10 MG tablet TAKE 1 TABLET BY MOUTH EVERY DAY (Patient taking differently: Take 10 mg by mouth daily as needed for allergies.) 30 tablet 6   cyanocobalamin  1000 MCG tablet Take 1 tablet (1,000 mcg total) by mouth every Monday, Wednesday, and Friday. Vit b12     ibuprofen  (ADVIL ) 200 MG tablet Take 400 mg by mouth every 6 (six) hours as needed for moderate pain.     sodium chloride  (OCEAN) 0.65 % SOLN nasal spray Place 1 spray into both nostrils at bedtime.     amLODipine  (NORVASC ) 5 MG tablet Take 1 tablet (5 mg total) by mouth daily. 90 tablet 3   amoxicillin  (AMOXIL ) 500 MG capsule Take four pills one hour prior to dental work (Patient taking  differently: Take four pills one hour prior to dental work) 8 capsule 2   pravastatin  (PRAVACHOL ) 20 MG tablet Take 1 tablet (20 mg total) by mouth daily. 90 tablet 4   sildenafil  (VIAGRA ) 100 MG tablet TAKE 1/2 TO 1 TABLET BY MOUTH 1 HOUR PRIOR AS NEEDED (Patient not taking: Reported on 02/23/2024) 9 tablet 6   No facility-administered medications prior to visit.     Per HPI unless specifically indicated in ROS section below Review of Systems  Constitutional:  Negative for activity change, appetite change, chills, fatigue, fever and unexpected weight change.  HENT:  Negative for hearing loss.   Eyes:  Negative for visual disturbance.  Respiratory:  Negative for cough, chest tightness, shortness of breath and wheezing.   Cardiovascular:  Negative for chest pain, palpitations and leg swelling.  Gastrointestinal:  Negative for abdominal distention, abdominal pain, blood in stool, constipation, diarrhea, nausea and vomiting.  Genitourinary:  Negative for difficulty urinating and hematuria.  Musculoskeletal:  Negative for arthralgias, myalgias and neck pain.  Skin:  Negative for rash.  Neurological:  Negative for dizziness, seizures, syncope and headaches.  Hematological:  Negative for adenopathy. Does not bruise/bleed easily.  Psychiatric/Behavioral:  Negative for dysphoric mood. The patient is not nervous/anxious.     Objective:  BP 126/80 (Cuff Size: Large)   Pulse 77   Temp 98.2 F (36.8 C) (Oral)   Ht 5' 6 (1.676 m)   Wt 234 lb 12.8 oz (106.5 kg)   SpO2 97%   BMI 37.90 kg/m   Wt Readings from Last 3 Encounters:  02/23/24 234 lb 12.8 oz (106.5 kg)  02/03/24 229 lb (103.9 kg)  12/13/23 229 lb 6.4 oz (104.1 kg)      Physical Exam Vitals and nursing note reviewed.  Constitutional:      General: He is not in acute distress.    Appearance: Normal appearance. He is well-developed. He is not ill-appearing.  HENT:     Head: Normocephalic and atraumatic.     Right Ear: Hearing,  tympanic membrane, ear canal and external ear normal.     Left Ear: Hearing, tympanic membrane, ear canal and external ear normal.     Mouth/Throat:     Mouth: Mucous membranes are moist.     Pharynx: Oropharynx is clear. No oropharyngeal exudate or posterior oropharyngeal erythema.  Eyes:     General: No scleral icterus.    Extraocular Movements: Extraocular movements intact.     Conjunctiva/sclera: Conjunctivae normal.     Pupils: Pupils are equal, round, and reactive to light.  Neck:     Thyroid: No thyroid mass or thyromegaly.  Cardiovascular:     Rate and Rhythm: Normal rate  and regular rhythm.     Pulses: Normal pulses.          Radial pulses are 2+ on the right side and 2+ on the left side.     Heart sounds: Normal heart sounds. No murmur heard. Pulmonary:     Effort: Pulmonary effort is normal. No respiratory distress.     Breath sounds: Normal breath sounds. No wheezing, rhonchi or rales.  Abdominal:     General: Bowel sounds are normal. There is no distension.     Palpations: Abdomen is soft. There is no mass.     Tenderness: There is no abdominal tenderness. There is no guarding or rebound.     Hernia: No hernia is present.  Musculoskeletal:        General: Normal range of motion.     Cervical back: Normal range of motion and neck supple.     Right lower leg: No edema.     Left lower leg: No edema.     Comments:  R shoulder WNL L shoulder exam: No deformity of shoulders on inspection. No significant pain with palpation of shoulder landmarks. FROM in abduction and forward flexion. No pain or weakness with testing SITS in ext/int rotation. No pain with empty can sign. Neg Yerguson, Speed test. No impingement. Mild discomfort with rotation of humeral head in GH joint.   Lymphadenopathy:     Cervical: No cervical adenopathy.  Skin:    General: Skin is warm and dry.     Findings: No rash.  Neurological:     General: No focal deficit present.     Mental Status: He  is alert and oriented to person, place, and time.  Psychiatric:        Mood and Affect: Mood normal.        Behavior: Behavior normal.        Thought Content: Thought content normal.        Judgment: Judgment normal.       Results for orders placed or performed in visit on 02/16/24  Vitamin B12   Collection Time: 02/16/24  7:43 AM  Result Value Ref Range   Vitamin B-12 477 211 - 911 pg/mL  PSA   Collection Time: 02/16/24  7:43 AM  Result Value Ref Range   PSA 0.39 0.10 - 4.00 ng/mL  Hemoglobin A1c   Collection Time: 02/16/24  7:43 AM  Result Value Ref Range   Hgb A1c MFr Bld 5.2 4.6 - 6.5 %  Comprehensive metabolic panel with GFR   Collection Time: 02/16/24  7:43 AM  Result Value Ref Range   Sodium 139 135 - 145 mEq/L   Potassium 4.2 3.5 - 5.1 mEq/L   Chloride 104 96 - 112 mEq/L   CO2 26 19 - 32 mEq/L   Glucose, Bld 104 (H) 70 - 99 mg/dL   BUN 14 6 - 23 mg/dL   Creatinine, Ser 9.27 0.40 - 1.50 mg/dL   Total Bilirubin 0.3 0.2 - 1.2 mg/dL   Alkaline Phosphatase 80 39 - 117 U/L   AST 20 0 - 37 U/L   ALT 18 0 - 53 U/L   Total Protein 6.7 6.0 - 8.3 g/dL   Albumin  4.1 3.5 - 5.2 g/dL   GFR 06.17 >39.99 mL/min   Calcium  9.0 8.4 - 10.5 mg/dL  Lipid panel   Collection Time: 02/16/24  7:43 AM  Result Value Ref Range   Cholesterol 145 0 - 200 mg/dL   Triglycerides 899.9 0.0 -  149.0 mg/dL   HDL 52.79 >60.99 mg/dL   VLDL 79.9 0.0 - 59.9 mg/dL   LDL Cholesterol 78 0 - 99 mg/dL   Total CHOL/HDL Ratio 3    NonHDL 97.57     Assessment & Plan:   Problem List Items Addressed This Visit     Healthcare maintenance - Primary (Chronic)   Preventative protocols reviewed and updated unless pt declined. Discussed healthy diet and lifestyle.       Advanced directives, counseling/discussion (Chronic)   Advanced directive planning - does not have set up. Advanced directive packet previously provided. Would want wife to be HCPOA.       HLD (hyperlipidemia)   Chronic, stable on  pravastatin  - continue. The 10-year ASCVD risk score (Arnett DK, et al., 2019) is: 15.3%   Values used to calculate the score:     Age: 30 years     Clincally relevant sex: Male     Is Non-Hispanic African American: No     Diabetic: No     Tobacco smoker: No     Systolic Blood Pressure: 126 mmHg     Is BP treated: Yes     HDL Cholesterol: 47.2 mg/dL     Total Cholesterol: 145 mg/dL       Relevant Medications   amLODipine  (NORVASC ) 5 MG tablet   pravastatin  (PRAVACHOL ) 20 MG tablet   Hyperglycemia   A1c remains normal. Encouraged limiting added sugar in diet.       Severe obesity (BMI 35.0-39.9) with comorbidity (HCC)   Continue to encourage healthy diet and lifestyle choices to affect sustainable weight loss.  Obesity complicated by comorbidities of HTN, HLD, hyperglycemia, osteoarthritis.       Low serum vitamin B12   Continue B12 MWF      Hypertension   Chronic, stable on amlodipine  5mg  daily - continue.      Relevant Medications   amLODipine  (NORVASC ) 5 MG tablet   pravastatin  (PRAVACHOL ) 20 MG tablet   Chronic left shoulder pain   Overall benign exam today  He does have some discomfort with testing glenohumeral joint, no significant pain to RTC testing. ?osteoarthritis vs mild labral injury. Rec tylenol  or ibuprofen  at night PRN, provided with general shoulder exercises to try. Update if new or worsening symptoms.         Meds ordered this encounter  Medications   amLODipine  (NORVASC ) 5 MG tablet    Sig: Take 1 tablet (5 mg total) by mouth daily.    Dispense:  90 tablet    Refill:  3   pravastatin  (PRAVACHOL ) 20 MG tablet    Sig: Take 1 tablet (20 mg total) by mouth daily.    Dispense:  90 tablet    Refill:  4    No orders of the defined types were placed in this encounter.   Patient Instructions  Flu shot today For left shoulder - try exercises provided today. Could also try extra strength tylenol  500mg  or low dose ibuprofen  at night time as needed  for shoulder pain.  You are doing well today  Return as needed or in 1 year for next physical   Follow up plan: Return in about 1 year (around 02/22/2025) for annual exam, prior fasting for blood work, medicare wellness visit.  Anton Blas, MD

## 2024-02-23 NOTE — Assessment & Plan Note (Signed)
 Advanced directive planning - does not have set up. Advanced directive packet previously provided. Would want wife to be HCPOA.

## 2024-02-23 NOTE — Assessment & Plan Note (Signed)
 A1c remains normal. Encouraged limiting added sugar in diet.

## 2024-02-23 NOTE — Assessment & Plan Note (Signed)
Chronic, stable on amlodipine 5mg daily - continue.  

## 2024-02-23 NOTE — Patient Instructions (Addendum)
 Flu shot today For left shoulder - try exercises provided today. Could also try extra strength tylenol  500mg  or low dose ibuprofen  at night time as needed for shoulder pain.  You are doing well today  Return as needed or in 1 year for next physical

## 2024-02-23 NOTE — Assessment & Plan Note (Signed)
 Continue B12 MWF

## 2024-02-23 NOTE — Assessment & Plan Note (Signed)
 Preventative protocols reviewed and updated unless pt declined. Discussed healthy diet and lifestyle.

## 2024-03-30 ENCOUNTER — Other Ambulatory Visit (INDEPENDENT_AMBULATORY_CARE_PROVIDER_SITE_OTHER): Payer: Self-pay

## 2024-03-30 ENCOUNTER — Ambulatory Visit: Admitting: Orthopaedic Surgery

## 2024-03-30 DIAGNOSIS — Z96651 Presence of right artificial knee joint: Secondary | ICD-10-CM | POA: Diagnosis not present

## 2024-03-30 NOTE — Progress Notes (Signed)
 "  Office Visit Note   Patient: Ricky Barton           Date of Birth: 05-Jan-1956           MRN: [id number] Visit Date: 03/30/2024              Requested by: Rilla Baller, MD [address] [place],  [place] [address] PCP: Rilla Baller, MD   Assessment & Plan: Visit Diagnoses:  1. Status post total right knee replacement     Plan: History of Present Illness Ricky Barton is a 69 year old male with bilateral total knee arthroplasties who presents for 2 year follow up from right TKA.  He ambulates without difficulty and has markedly improved function compared to preoperative status, with stable walking ability.  He reports a burning sensation across both knees only after prolonged sitting without leg extension, which resolves with standing and walking. He denies persistent pain, instability, mechanical symptoms, or crepitus and feels the knees are stable.  He can perform physically demanding tasks such as installing flooring while using knee pads but has difficulty squatting and rising from low positions and often needs support to stand. He is considering resuming his prior physical therapy exercise program.  He is satisfied with the overall function of both knee arthroplasties.  Physical Exam Right knee: healed scar, painless ROM, collaterals stable, no joint effusion, adequate quad strength  Results Radiology Right knee radiographs (03/30/2024): Well-positioned right knee prosthesis with complete osseous ingrowth, no evidence of loosening or subsidence (Independently interpreted)  Assessment and Plan Status post right total knee arthroplasty Two years post right and three years post left total knee arthroplasty with stable, well-functioning prostheses. Significant functional improvement noted. Mild burning discomfort with prolonged sitting attributed to postoperative soft tissue changes and quadriceps weakness. Radiographs show well-ingrown implants without  loosening or subsidence. No infection or prosthetic complications. Antibiotic prophylaxis for dental procedures is no longer indicated. - Reviewed knee radiographs showing well-ingrown implants without loosening or subsidence. - Advised no further routine orthopedic follow-up unless new symptoms develop. - Discussed importance of maintaining quadriceps strength and weight management. - Encouraged resumption of physical therapy exercises to improve quadriceps strength and function. - Confirmed antibiotic prophylaxis for dental procedures is no longer required.  Follow-Up Instructions: Return if symptoms worsen or fail to improve.   Orders:  Orders Placed This Encounter  Procedures   XR Knee 1-2 Views Right   No orders of the defined types were placed in this encounter.     Procedures: No procedures performed   Clinical Data: No additional findings.   Subjective: Chief Complaint  Patient presents with   Right Knee - Follow-up    Right TKA 04/03/2022   Left Knee - Follow-up    Left TKA 05/12/2021    HPI  Review of Systems   Objective: Vital Signs: There were no vitals taken for this visit.  Physical Exam  Ortho Exam  Specialty Comments:  No specialty comments available.  Imaging: XR Knee 1-2 Views Right Result Date: 03/30/2024 X-rays demonstrate status post left total knee arthroplasty without any evidence of loosening or subsidence.    PMFS History: Patient Active Problem List   Diagnosis Date Noted   Chronic left shoulder pain 02/23/2024   Hypertension 02/16/2023   Status post total right knee replacement 04/03/2022   Status post total left knee replacement 05/12/2021   Allergic rhinitis 12/09/2020   Low serum vitamin B12 12/07/2020   Welcome to Medicare preventive  visit 12/06/2020   Advanced directives, counseling/discussion 12/06/2020   Angular cheilitis 12/06/2020   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   Renal lesion 03/28/2018   Arthralgia of hands,  bilateral 09/12/2015   OSA on CPAP 09/12/2015   Severe obesity (BMI 35.0-39.9) with comorbidity (HCC) 04/05/2014   Hyperglycemia 03/30/2013   Healthcare maintenance 03/04/2012   HLD (hyperlipidemia) 01/18/2009   ERECTILE DYSFUNCTION, 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   History of seizure 08/15/1991   Past Medical History:  Diagnosis Date   Arthritis    bilateral knees   Hypoglycemic syndrome    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    OSA on CPAP 09/12/2015   Severe by HST   Seizures (HCC) 1990s   isolated, none since, was on dilantin for 3 yrs, then stopped   Sleep apnea    uses CPAP nightly    Family History  Problem Relation Age of Onset   Stomach cancer Mother    Hypertension Mother    Colon polyps Mother    Cancer Father 74       stomach cancer   Hyperlipidemia Brother    CAD Brother 33       MI (smoker)   Aneurysm Brother    Hyperlipidemia Brother    Sleep apnea Brother    Pancreatic cancer Brother 81   Hyperlipidemia Brother    CAD Brother 64       MI (smoker)   Hyperlipidemia Brother    Multiple sclerosis Brother    Colon cancer Neg Hx    Esophageal cancer Neg Hx    Rectal cancer Neg Hx     Past Surgical History:  Procedure Laterality Date   CHOLECYSTECTOMY  1989   COLONOSCOPY  03/2011   3 adenomatous polyps, rec rpt 5 yrs (Dr Aneita).   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   Procedure: LEFT TOTAL KNEE ARTHROPLASTY;  Surgeon: Jerri Kay HERO, MD;  Location: [facility] OR;  Service: Orthopedics;  Laterality: Left;   TOTAL KNEE ARTHROPLASTY Right 04/03/2022   Procedure: RIGHT TOTAL KNEE ARTHROPLASTY;  Surgeon: Jerri Kay HERO, MD;  Location: [facility] OR;  Service: Orthopedics;  Laterality: Right;   Social History   Occupational History   Occupation: Barista: [employer]     Comment: [employer]  Tobacco Use   Smoking status: Never   Smokeless tobacco: Never  Vaping Use   Vaping status: Never Used  Substance and Sexual Activity   Alcohol use: No   Drug use: No   Sexual activity: Yes        "

## 2025-02-19 ENCOUNTER — Other Ambulatory Visit

## 2025-02-23 ENCOUNTER — Encounter: Admitting: Family Medicine

## 2025-02-26 ENCOUNTER — Ambulatory Visit

## 2025-02-26 ENCOUNTER — Encounter: Admitting: Family Medicine
